# Patient Record
Sex: Female | Born: 1955
Health system: Southern US, Community
[De-identification: ages and names within clinical notes are randomized; demographics above are authoritative.]

## PROBLEM LIST (undated history)

## (undated) DIAGNOSIS — C801 Malignant (primary) neoplasm, unspecified: Secondary | ICD-10-CM

## (undated) DIAGNOSIS — J302 Other seasonal allergic rhinitis: Secondary | ICD-10-CM

## (undated) DIAGNOSIS — K5900 Constipation, unspecified: Secondary | ICD-10-CM

## (undated) DIAGNOSIS — M48061 Spinal stenosis, lumbar region without neurogenic claudication: Secondary | ICD-10-CM

## (undated) DIAGNOSIS — R079 Chest pain, unspecified: Secondary | ICD-10-CM

## (undated) DIAGNOSIS — E1140 Type 2 diabetes mellitus with diabetic neuropathy, unspecified: Secondary | ICD-10-CM

## (undated) DIAGNOSIS — M2550 Pain in unspecified joint: Secondary | ICD-10-CM

## (undated) DIAGNOSIS — T7840XA Allergy, unspecified, initial encounter: Secondary | ICD-10-CM

## (undated) DIAGNOSIS — R112 Nausea with vomiting, unspecified: Secondary | ICD-10-CM

## (undated) DIAGNOSIS — M170 Bilateral primary osteoarthritis of knee: Secondary | ICD-10-CM

## (undated) DIAGNOSIS — D649 Anemia, unspecified: Secondary | ICD-10-CM

## (undated) DIAGNOSIS — R1310 Dysphagia, unspecified: Secondary | ICD-10-CM

## (undated) DIAGNOSIS — Z9889 Other specified postprocedural states: Secondary | ICD-10-CM

## (undated) DIAGNOSIS — G5600 Carpal tunnel syndrome, unspecified upper limb: Secondary | ICD-10-CM

## (undated) DIAGNOSIS — F419 Anxiety disorder, unspecified: Secondary | ICD-10-CM

## (undated) DIAGNOSIS — H409 Unspecified glaucoma: Secondary | ICD-10-CM

## (undated) DIAGNOSIS — E669 Obesity, unspecified: Secondary | ICD-10-CM

## (undated) DIAGNOSIS — I10 Essential (primary) hypertension: Secondary | ICD-10-CM

## (undated) DIAGNOSIS — I499 Cardiac arrhythmia, unspecified: Secondary | ICD-10-CM

## (undated) DIAGNOSIS — E7800 Pure hypercholesterolemia, unspecified: Secondary | ICD-10-CM

## (undated) DIAGNOSIS — E039 Hypothyroidism, unspecified: Secondary | ICD-10-CM

## (undated) DIAGNOSIS — R002 Palpitations: Secondary | ICD-10-CM

## (undated) DIAGNOSIS — Z85850 Personal history of malignant neoplasm of thyroid: Secondary | ICD-10-CM

## (undated) DIAGNOSIS — K760 Fatty (change of) liver, not elsewhere classified: Secondary | ICD-10-CM

## (undated) DIAGNOSIS — I509 Heart failure, unspecified: Secondary | ICD-10-CM

## (undated) DIAGNOSIS — N189 Chronic kidney disease, unspecified: Secondary | ICD-10-CM

## (undated) DIAGNOSIS — R011 Cardiac murmur, unspecified: Secondary | ICD-10-CM

## (undated) DIAGNOSIS — K219 Gastro-esophageal reflux disease without esophagitis: Secondary | ICD-10-CM

## (undated) DIAGNOSIS — M549 Dorsalgia, unspecified: Secondary | ICD-10-CM

## (undated) DIAGNOSIS — M1990 Unspecified osteoarthritis, unspecified site: Secondary | ICD-10-CM

## (undated) DIAGNOSIS — M7989 Other specified soft tissue disorders: Secondary | ICD-10-CM

## (undated) DIAGNOSIS — M069 Rheumatoid arthritis, unspecified: Secondary | ICD-10-CM

## (undated) HISTORY — DX: Type 2 diabetes mellitus with diabetic neuropathy, unspecified: E11.40

## (undated) HISTORY — DX: Chest pain, unspecified: R07.9

## (undated) HISTORY — DX: Malignant (primary) neoplasm, unspecified: C80.1

## (undated) HISTORY — DX: Essential (primary) hypertension: I10

## (undated) HISTORY — DX: Cardiac murmur, unspecified: R01.1

## (undated) HISTORY — DX: Spinal stenosis, lumbar region without neurogenic claudication: M48.061

## (undated) HISTORY — DX: Pure hypercholesterolemia, unspecified: E78.00

## (undated) HISTORY — DX: Bilateral primary osteoarthritis of knee: M17.0

## (undated) HISTORY — PX: ROTATOR CUFF REPAIR: SHX139

## (undated) HISTORY — DX: Pain in unspecified joint: M25.50

## (undated) HISTORY — DX: Dysphagia, unspecified: R13.10

## (undated) HISTORY — DX: Other seasonal allergic rhinitis: J30.2

## (undated) HISTORY — DX: Chronic kidney disease, unspecified: N18.9

## (undated) HISTORY — DX: Anemia, unspecified: D64.9

## (undated) HISTORY — PX: CHOLECYSTECTOMY: SHX55

## (undated) HISTORY — PX: TUBAL LIGATION: SHX77

## (undated) HISTORY — DX: Carpal tunnel syndrome, unspecified upper limb: G56.00

## (undated) HISTORY — DX: Fatty (change of) liver, not elsewhere classified: K76.0

## (undated) HISTORY — PX: ELBOW SURGERY: SHX618

## (undated) HISTORY — PX: SPINE SURGERY: SHX786

## (undated) HISTORY — DX: Other specified soft tissue disorders: M79.89

## (undated) HISTORY — DX: Unspecified glaucoma: H40.9

## (undated) HISTORY — DX: Allergy, unspecified, initial encounter: T78.40XA

## (undated) HISTORY — DX: Unspecified osteoarthritis, unspecified site: M19.90

## (undated) HISTORY — PX: CARPAL TUNNEL RELEASE: SHX101

## (undated) HISTORY — DX: Constipation, unspecified: K59.00

## (undated) HISTORY — DX: Cardiac arrhythmia, unspecified: I49.9

## (undated) HISTORY — PX: BACK SURGERY: SHX140

## (undated) HISTORY — DX: Heart failure, unspecified: I50.9

## (undated) HISTORY — DX: Rheumatoid arthritis, unspecified: M06.9

## (undated) HISTORY — DX: Personal history of malignant neoplasm of thyroid: Z85.850

## (undated) HISTORY — DX: Palpitations: R00.2

---

## 1985-07-30 HISTORY — PX: TONSILECTOMY, ADENOIDECTOMY, BILATERAL MYRINGOTOMY AND TUBES: SHX2538

## 1985-07-30 HISTORY — PX: TONSILLECTOMY: SUR1361

## 1989-03-30 HISTORY — PX: CHOLECYSTECTOMY: SHX55

## 1996-07-30 DIAGNOSIS — F32A Depression, unspecified: Secondary | ICD-10-CM

## 1996-07-30 HISTORY — DX: Depression, unspecified: F32.A

## 1997-07-30 HISTORY — PX: FOOT SURGERY: SHX648

## 1998-02-18 ENCOUNTER — Ambulatory Visit (HOSPITAL_COMMUNITY): Admission: RE | Admit: 1998-02-18 | Discharge: 1998-02-18 | Payer: Self-pay

## 1998-04-20 ENCOUNTER — Emergency Department (HOSPITAL_COMMUNITY): Admission: EM | Admit: 1998-04-20 | Discharge: 1998-04-20 | Payer: Self-pay | Admitting: Emergency Medicine

## 1998-04-20 ENCOUNTER — Encounter: Payer: Self-pay | Admitting: Emergency Medicine

## 2001-01-20 ENCOUNTER — Ambulatory Visit (HOSPITAL_COMMUNITY): Admission: RE | Admit: 2001-01-20 | Discharge: 2001-01-20 | Payer: Self-pay | Admitting: Family Medicine

## 2001-01-20 ENCOUNTER — Encounter: Payer: Self-pay | Admitting: Family Medicine

## 2001-02-25 ENCOUNTER — Ambulatory Visit (HOSPITAL_COMMUNITY): Admission: RE | Admit: 2001-02-25 | Discharge: 2001-02-25 | Payer: Self-pay | Admitting: Orthopedic Surgery

## 2001-02-25 ENCOUNTER — Encounter: Payer: Self-pay | Admitting: Orthopedic Surgery

## 2001-03-06 ENCOUNTER — Encounter: Admission: RE | Admit: 2001-03-06 | Discharge: 2001-05-26 | Payer: Self-pay | Admitting: Orthopedic Surgery

## 2001-07-31 ENCOUNTER — Ambulatory Visit (HOSPITAL_COMMUNITY): Admission: RE | Admit: 2001-07-31 | Discharge: 2001-07-31 | Payer: Self-pay | Admitting: Orthopedic Surgery

## 2001-08-11 ENCOUNTER — Encounter: Admission: RE | Admit: 2001-08-11 | Discharge: 2001-10-22 | Payer: Self-pay | Admitting: Orthopedic Surgery

## 2001-09-23 ENCOUNTER — Inpatient Hospital Stay (HOSPITAL_COMMUNITY): Admission: RE | Admit: 2001-09-23 | Discharge: 2001-09-27 | Payer: Self-pay | Admitting: Psychiatry

## 2002-03-23 ENCOUNTER — Ambulatory Visit (HOSPITAL_COMMUNITY): Admission: RE | Admit: 2002-03-23 | Discharge: 2002-03-23 | Payer: Self-pay | Admitting: Internal Medicine

## 2002-03-23 ENCOUNTER — Encounter: Payer: Self-pay | Admitting: Internal Medicine

## 2002-08-26 ENCOUNTER — Ambulatory Visit (HOSPITAL_COMMUNITY): Admission: RE | Admit: 2002-08-26 | Discharge: 2002-08-26 | Payer: Self-pay | Admitting: Family Medicine

## 2003-09-06 ENCOUNTER — Ambulatory Visit (HOSPITAL_COMMUNITY): Admission: RE | Admit: 2003-09-06 | Discharge: 2003-09-06 | Payer: Self-pay | Admitting: Family Medicine

## 2003-09-14 ENCOUNTER — Ambulatory Visit (HOSPITAL_COMMUNITY): Admission: RE | Admit: 2003-09-14 | Discharge: 2003-09-14 | Payer: Self-pay | Admitting: Orthopedic Surgery

## 2004-04-04 ENCOUNTER — Ambulatory Visit: Payer: Self-pay | Admitting: Internal Medicine

## 2004-05-01 ENCOUNTER — Ambulatory Visit: Payer: Self-pay | Admitting: Family Medicine

## 2004-05-04 ENCOUNTER — Ambulatory Visit: Payer: Self-pay | Admitting: *Deleted

## 2004-06-06 ENCOUNTER — Ambulatory Visit: Payer: Self-pay | Admitting: Family Medicine

## 2004-09-22 ENCOUNTER — Ambulatory Visit: Payer: Self-pay | Admitting: Family Medicine

## 2004-11-02 ENCOUNTER — Ambulatory Visit: Payer: Self-pay | Admitting: Family Medicine

## 2004-11-02 ENCOUNTER — Ambulatory Visit (HOSPITAL_COMMUNITY): Admission: RE | Admit: 2004-11-02 | Discharge: 2004-11-02 | Payer: Self-pay | Admitting: Internal Medicine

## 2004-11-06 ENCOUNTER — Ambulatory Visit: Payer: Self-pay | Admitting: Family Medicine

## 2005-02-12 ENCOUNTER — Ambulatory Visit: Payer: Self-pay | Admitting: Internal Medicine

## 2005-03-14 ENCOUNTER — Ambulatory Visit: Payer: Self-pay | Admitting: Internal Medicine

## 2005-03-21 ENCOUNTER — Encounter: Admission: RE | Admit: 2005-03-21 | Discharge: 2005-03-21 | Payer: Self-pay | Admitting: Internal Medicine

## 2005-05-08 ENCOUNTER — Ambulatory Visit: Payer: Self-pay | Admitting: Family Medicine

## 2005-09-28 ENCOUNTER — Ambulatory Visit: Payer: Self-pay | Admitting: Family Medicine

## 2005-10-26 ENCOUNTER — Ambulatory Visit: Payer: Self-pay | Admitting: Family Medicine

## 2006-01-10 ENCOUNTER — Ambulatory Visit: Payer: Self-pay | Admitting: Family Medicine

## 2006-04-16 ENCOUNTER — Ambulatory Visit: Payer: Self-pay | Admitting: Family Medicine

## 2006-09-10 ENCOUNTER — Ambulatory Visit: Payer: Self-pay | Admitting: Family Medicine

## 2007-04-23 ENCOUNTER — Ambulatory Visit: Payer: Self-pay | Admitting: Internal Medicine

## 2007-05-02 ENCOUNTER — Encounter: Admission: RE | Admit: 2007-05-02 | Discharge: 2007-05-23 | Payer: Self-pay | Admitting: Family Medicine

## 2007-09-16 ENCOUNTER — Emergency Department (HOSPITAL_COMMUNITY): Admission: EM | Admit: 2007-09-16 | Discharge: 2007-09-16 | Payer: Self-pay | Admitting: Emergency Medicine

## 2007-11-18 ENCOUNTER — Ambulatory Visit: Payer: Self-pay | Admitting: Internal Medicine

## 2008-05-14 ENCOUNTER — Ambulatory Visit: Payer: Self-pay | Admitting: Internal Medicine

## 2008-05-18 ENCOUNTER — Ambulatory Visit: Payer: Self-pay | Admitting: Internal Medicine

## 2008-05-20 ENCOUNTER — Ambulatory Visit: Payer: Self-pay | Admitting: Internal Medicine

## 2008-06-09 ENCOUNTER — Encounter (INDEPENDENT_AMBULATORY_CARE_PROVIDER_SITE_OTHER): Payer: Self-pay | Admitting: Family Medicine

## 2008-06-09 ENCOUNTER — Ambulatory Visit: Payer: Self-pay | Admitting: Internal Medicine

## 2008-06-09 LAB — CONVERTED CEMR LAB
BUN: 9 mg/dL (ref 6–23)
CO2: 24 meq/L (ref 19–32)
Calcium: 9.4 mg/dL (ref 8.4–10.5)
Chloride: 102 meq/L (ref 96–112)
Cholesterol: 173 mg/dL (ref 0–200)
Creatinine, Ser: 0.93 mg/dL (ref 0.40–1.20)
HDL: 39 mg/dL — ABNORMAL LOW (ref >39)
Total CHOL/HDL Ratio: 4.4
Triglycerides: 186 mg/dL — ABNORMAL HIGH (ref <150)

## 2008-10-08 ENCOUNTER — Ambulatory Visit: Payer: Self-pay | Admitting: Family Medicine

## 2009-01-13 ENCOUNTER — Encounter: Payer: Self-pay | Admitting: Internal Medicine

## 2009-01-13 ENCOUNTER — Ambulatory Visit: Payer: Self-pay | Admitting: Internal Medicine

## 2009-01-13 DIAGNOSIS — M1990 Unspecified osteoarthritis, unspecified site: Secondary | ICD-10-CM | POA: Insufficient documentation

## 2009-01-13 DIAGNOSIS — E119 Type 2 diabetes mellitus without complications: Secondary | ICD-10-CM | POA: Insufficient documentation

## 2009-01-13 DIAGNOSIS — I10 Essential (primary) hypertension: Secondary | ICD-10-CM

## 2009-01-13 DIAGNOSIS — E785 Hyperlipidemia, unspecified: Secondary | ICD-10-CM | POA: Insufficient documentation

## 2009-03-23 ENCOUNTER — Ambulatory Visit: Payer: Self-pay | Admitting: Internal Medicine

## 2009-03-23 ENCOUNTER — Encounter (INDEPENDENT_AMBULATORY_CARE_PROVIDER_SITE_OTHER): Payer: Self-pay | Admitting: Adult Health

## 2009-03-23 ENCOUNTER — Encounter: Payer: Self-pay | Admitting: Cardiovascular Disease

## 2009-03-23 LAB — CONVERTED CEMR LAB
AST: 13 units/L (ref 0–37)
Albumin: 4.2 g/dL (ref 3.5–5.2)
BUN: 12 mg/dL (ref 6–23)
Calcium: 9 mg/dL (ref 8.4–10.5)
Chloride: 101 meq/L (ref 96–112)
Glucose, Bld: 89 mg/dL (ref 70–99)
HDL: 41 mg/dL (ref >39)
Potassium: 3.5 meq/L (ref 3.5–5.3)
Triglycerides: 117 mg/dL (ref <150)

## 2009-03-28 ENCOUNTER — Ambulatory Visit (HOSPITAL_COMMUNITY): Admission: RE | Admit: 2009-03-28 | Discharge: 2009-03-28 | Payer: Self-pay | Admitting: Internal Medicine

## 2009-04-01 ENCOUNTER — Encounter: Admission: RE | Admit: 2009-04-01 | Discharge: 2009-04-01 | Payer: Self-pay | Admitting: Internal Medicine

## 2009-04-07 ENCOUNTER — Ambulatory Visit: Payer: Self-pay | Admitting: Internal Medicine

## 2009-04-12 DIAGNOSIS — J40 Bronchitis, not specified as acute or chronic: Secondary | ICD-10-CM | POA: Insufficient documentation

## 2009-04-12 DIAGNOSIS — K219 Gastro-esophageal reflux disease without esophagitis: Secondary | ICD-10-CM | POA: Insufficient documentation

## 2009-04-13 ENCOUNTER — Ambulatory Visit: Payer: Self-pay | Admitting: Cardiovascular Disease

## 2009-04-13 DIAGNOSIS — R0602 Shortness of breath: Secondary | ICD-10-CM

## 2009-04-13 DIAGNOSIS — R079 Chest pain, unspecified: Secondary | ICD-10-CM | POA: Insufficient documentation

## 2009-04-20 ENCOUNTER — Ambulatory Visit: Payer: Self-pay

## 2009-04-21 ENCOUNTER — Encounter: Payer: Self-pay | Admitting: Cardiology

## 2009-04-21 ENCOUNTER — Ambulatory Visit: Payer: Self-pay

## 2009-05-04 ENCOUNTER — Ambulatory Visit: Payer: Self-pay | Admitting: Internal Medicine

## 2009-05-04 ENCOUNTER — Telehealth (INDEPENDENT_AMBULATORY_CARE_PROVIDER_SITE_OTHER): Payer: Self-pay | Admitting: *Deleted

## 2009-05-04 ENCOUNTER — Encounter (INDEPENDENT_AMBULATORY_CARE_PROVIDER_SITE_OTHER): Payer: Self-pay | Admitting: Adult Health

## 2009-06-20 ENCOUNTER — Encounter (INDEPENDENT_AMBULATORY_CARE_PROVIDER_SITE_OTHER): Payer: Self-pay | Admitting: Adult Health

## 2009-06-20 ENCOUNTER — Ambulatory Visit: Payer: Self-pay | Admitting: Internal Medicine

## 2009-06-20 ENCOUNTER — Other Ambulatory Visit: Admission: RE | Admit: 2009-06-20 | Discharge: 2009-06-20 | Payer: Self-pay | Admitting: Internal Medicine

## 2009-06-20 LAB — CONVERTED CEMR LAB
Chlamydia, DNA Probe: NEGATIVE
Microalb, Ur: 4.4 mg/dL — ABNORMAL HIGH (ref 0.00–1.89)

## 2009-07-18 ENCOUNTER — Ambulatory Visit: Payer: Self-pay | Admitting: Internal Medicine

## 2009-07-30 DIAGNOSIS — Z85850 Personal history of malignant neoplasm of thyroid: Secondary | ICD-10-CM

## 2009-07-30 HISTORY — DX: Personal history of malignant neoplasm of thyroid: Z85.850

## 2009-07-30 HISTORY — PX: TOTAL THYROIDECTOMY: SHX2547

## 2009-10-17 ENCOUNTER — Ambulatory Visit: Payer: Self-pay | Admitting: Internal Medicine

## 2009-10-27 ENCOUNTER — Emergency Department (HOSPITAL_COMMUNITY): Admission: EM | Admit: 2009-10-27 | Discharge: 2009-10-27 | Payer: Self-pay | Admitting: Emergency Medicine

## 2009-11-04 ENCOUNTER — Encounter: Admission: RE | Admit: 2009-11-04 | Discharge: 2009-11-04 | Payer: Self-pay | Admitting: Internal Medicine

## 2009-11-14 ENCOUNTER — Ambulatory Visit: Payer: Self-pay | Admitting: Family Medicine

## 2009-11-14 ENCOUNTER — Encounter (INDEPENDENT_AMBULATORY_CARE_PROVIDER_SITE_OTHER): Payer: Self-pay | Admitting: Adult Health

## 2009-11-14 LAB — CONVERTED CEMR LAB
ALT: 9 units/L (ref 0–35)
AST: 11 units/L (ref 0–37)
Albumin: 4.4 g/dL (ref 3.5–5.2)
Alkaline Phosphatase: 57 units/L (ref 39–117)
BUN: 17 mg/dL (ref 6–23)
Calcium: 9.4 mg/dL (ref 8.4–10.5)
Chloride: 102 meq/L (ref 96–112)
Eosinophils Relative: 5 % (ref 0–5)
Free T4: 0.78 ng/dL — ABNORMAL LOW (ref 0.80–1.80)
HCT: 34.6 % — ABNORMAL LOW (ref 36.0–46.0)
LDL Cholesterol: 88 mg/dL (ref 0–99)
Lymphocytes Relative: 39 % (ref 12–46)
Lymphs Abs: 1.9 10*3/uL (ref 0.7–4.0)
Neutro Abs: 2.3 10*3/uL (ref 1.7–7.7)
Platelets: 345 10*3/uL (ref 150–400)
Potassium: 4 meq/L (ref 3.5–5.3)
Sodium: 139 meq/L (ref 135–145)
TSH: 2.648 microintl units/mL (ref 0.350–4.500)
Total Protein: 7.2 g/dL (ref 6.0–8.3)
Vit D, 25-Hydroxy: 17 ng/mL — ABNORMAL LOW (ref 30–89)
WBC: 4.8 10*3/uL (ref 4.0–10.5)

## 2009-11-15 ENCOUNTER — Encounter (INDEPENDENT_AMBULATORY_CARE_PROVIDER_SITE_OTHER): Payer: Self-pay | Admitting: Adult Health

## 2009-11-16 ENCOUNTER — Ambulatory Visit (HOSPITAL_COMMUNITY): Admission: RE | Admit: 2009-11-16 | Discharge: 2009-11-16 | Payer: Self-pay | Admitting: Internal Medicine

## 2010-02-08 ENCOUNTER — Ambulatory Visit: Payer: Self-pay | Admitting: Internal Medicine

## 2010-02-14 ENCOUNTER — Ambulatory Visit (HOSPITAL_COMMUNITY): Admission: RE | Admit: 2010-02-14 | Discharge: 2010-02-14 | Payer: Self-pay | Admitting: Internal Medicine

## 2010-02-21 ENCOUNTER — Ambulatory Visit: Payer: Self-pay | Admitting: Internal Medicine

## 2010-03-01 ENCOUNTER — Other Ambulatory Visit: Admission: RE | Admit: 2010-03-01 | Discharge: 2010-03-01 | Payer: Self-pay | Admitting: Interventional Radiology

## 2010-03-01 ENCOUNTER — Encounter: Admission: RE | Admit: 2010-03-01 | Discharge: 2010-03-01 | Payer: Self-pay | Admitting: Internal Medicine

## 2010-03-10 ENCOUNTER — Ambulatory Visit: Payer: Self-pay | Admitting: Internal Medicine

## 2010-03-29 ENCOUNTER — Ambulatory Visit (HOSPITAL_COMMUNITY): Admission: RE | Admit: 2010-03-29 | Discharge: 2010-03-29 | Payer: Self-pay | Admitting: Internal Medicine

## 2010-04-07 ENCOUNTER — Emergency Department (HOSPITAL_COMMUNITY): Admission: EM | Admit: 2010-04-07 | Discharge: 2010-04-07 | Payer: Self-pay | Admitting: Emergency Medicine

## 2010-05-12 ENCOUNTER — Encounter (INDEPENDENT_AMBULATORY_CARE_PROVIDER_SITE_OTHER): Payer: Self-pay | Admitting: Surgery

## 2010-05-12 ENCOUNTER — Ambulatory Visit (HOSPITAL_COMMUNITY): Admission: RE | Admit: 2010-05-12 | Discharge: 2010-05-13 | Payer: Self-pay | Admitting: Surgery

## 2010-06-19 ENCOUNTER — Encounter (HOSPITAL_COMMUNITY)
Admission: RE | Admit: 2010-06-19 | Discharge: 2010-08-29 | Payer: Self-pay | Source: Home / Self Care | Attending: Internal Medicine | Admitting: Internal Medicine

## 2010-07-25 ENCOUNTER — Encounter (INDEPENDENT_AMBULATORY_CARE_PROVIDER_SITE_OTHER): Payer: Self-pay | Admitting: *Deleted

## 2010-07-25 LAB — CONVERTED CEMR LAB
BUN: 13 mg/dL (ref 6–23)
Basophils Absolute: 0 10*3/uL (ref 0.0–0.1)
Basophils Relative: 0 % (ref 0–1)
CO2: 23 meq/L (ref 19–32)
Chloride: 103 meq/L (ref 96–112)
Creatinine, Ser: 0.92 mg/dL (ref 0.40–1.20)
Hemoglobin: 10.3 g/dL — ABNORMAL LOW (ref 12.0–15.0)
Lymphocytes Relative: 21 % (ref 12–46)
Monocytes Absolute: 0.5 10*3/uL (ref 0.1–1.0)
Monocytes Relative: 8 % (ref 3–12)
Neutro Abs: 3.1 10*3/uL (ref 1.7–7.7)
Neutrophils Relative %: 52 % (ref 43–77)
Potassium: 3.7 meq/L (ref 3.5–5.3)
RBC: 3.96 M/uL (ref 3.87–5.11)
Vit D, 25-Hydroxy: 23 ng/mL — ABNORMAL LOW (ref 30–89)
WBC: 6 10*3/uL (ref 4.0–10.5)

## 2010-08-19 ENCOUNTER — Encounter: Payer: Self-pay | Admitting: Internal Medicine

## 2010-10-11 LAB — CALCIUM: Calcium: 8.9 mg/dL (ref 8.4–10.5)

## 2010-10-12 LAB — CBC
Hemoglobin: 10.1 g/dL — ABNORMAL LOW (ref 12.0–15.0)
Hemoglobin: 10.9 g/dL — ABNORMAL LOW (ref 12.0–15.0)
MCH: 25.9 pg — ABNORMAL LOW (ref 26.0–34.0)
MCV: 79.8 fL (ref 78.0–100.0)
RBC: 3.73 MIL/uL — ABNORMAL LOW (ref 3.87–5.11)
RBC: 4.21 MIL/uL (ref 3.87–5.11)
WBC: 5.8 10*3/uL (ref 4.0–10.5)

## 2010-10-12 LAB — URINE MICROSCOPIC-ADD ON

## 2010-10-12 LAB — DIFFERENTIAL
Basophils Relative: 1 % (ref 0–1)
Eosinophils Absolute: 0.1 10*3/uL (ref 0.0–0.7)
Eosinophils Relative: 1 % (ref 0–5)
Lymphocytes Relative: 25 % (ref 12–46)
Lymphs Abs: 1.5 10*3/uL (ref 0.7–4.0)
Lymphs Abs: 0.7 10*3/uL (ref 0.7–4.0)
Monocytes Relative: 6 % (ref 3–12)
Monocytes Relative: 7 % (ref 3–12)
Neutro Abs: 3.5 10*3/uL (ref 1.7–7.7)
Neutrophils Relative %: 60 % (ref 43–77)

## 2010-10-12 LAB — BASIC METABOLIC PANEL
CO2: 26 mEq/L (ref 19–32)
Chloride: 100 mEq/L (ref 96–112)
GFR calc Af Amer: 48 mL/min — ABNORMAL LOW (ref >60)
Sodium: 138 mEq/L (ref 135–145)

## 2010-10-12 LAB — COMPREHENSIVE METABOLIC PANEL
ALT: 12 U/L (ref 0–35)
Alkaline Phosphatase: 58 U/L (ref 39–117)
CO2: 27 mEq/L (ref 19–32)
Calcium: 8.9 mg/dL (ref 8.4–10.5)
GFR calc non Af Amer: 56 mL/min — ABNORMAL LOW (ref >60)
Glucose, Bld: 161 mg/dL — ABNORMAL HIGH (ref 70–99)
Potassium: 3.3 mEq/L — ABNORMAL LOW (ref 3.5–5.1)
Sodium: 139 mEq/L (ref 135–145)

## 2010-10-12 LAB — URINALYSIS, ROUTINE W REFLEX MICROSCOPIC
Bilirubin Urine: NEGATIVE
Glucose, UA: NEGATIVE mg/dL
Ketones, ur: NEGATIVE mg/dL
Nitrite: NEGATIVE
Protein, ur: 100 mg/dL — AB
Specific Gravity, Urine: 1.018 (ref 1.005–1.030)
Urobilinogen, UA: 0.2 mg/dL (ref 0.0–1.0)

## 2010-10-12 LAB — URINE CULTURE: Colony Count: >=100000

## 2010-10-12 LAB — PREGNANCY, URINE: Preg Test, Ur: NEGATIVE

## 2010-10-12 LAB — SURGICAL PCR SCREEN: Staphylococcus aureus: POSITIVE — AB

## 2010-10-12 LAB — PROTIME-INR: INR: 1.13 (ref 0.00–1.49)

## 2010-10-18 ENCOUNTER — Encounter: Payer: Self-pay | Admitting: Internal Medicine

## 2010-10-18 ENCOUNTER — Encounter (INDEPENDENT_AMBULATORY_CARE_PROVIDER_SITE_OTHER): Payer: Self-pay | Admitting: *Deleted

## 2010-10-18 LAB — CONVERTED CEMR LAB
AST: 14 units/L (ref 0–37)
Alkaline Phosphatase: 61 units/L (ref 39–117)
BUN: 16 mg/dL (ref 6–23)
Basophils Relative: 0 % (ref 0–1)
Creatinine, Ser: 0.99 mg/dL (ref 0.40–1.20)
Eosinophils Absolute: 0.5 10*3/uL (ref 0.0–0.7)
Eosinophils Relative: 9 % — ABNORMAL HIGH (ref 0–5)
Free T4: 1.71 ng/dL (ref 0.80–1.80)
Glucose, Bld: 130 mg/dL — ABNORMAL HIGH (ref 70–99)
HCT: 32.5 % — ABNORMAL LOW (ref 36.0–46.0)
MCHC: 31.7 g/dL (ref 30.0–36.0)
MCV: 82.1 fL (ref 78.0–100.0)
Monocytes Relative: 6 % (ref 3–12)
Neutrophils Relative %: 61 % (ref 43–77)
Potassium: 4 meq/L (ref 3.5–5.3)
RBC: 3.96 M/uL (ref 3.87–5.11)
T3, Total: 88.5 ng/dL (ref 80.0–204.0)
TSH: <0.008 microintl units/mL — ABNORMAL LOW (ref 0.350–4.500)
Total Bilirubin: 0.3 mg/dL (ref 0.3–1.2)

## 2010-10-19 ENCOUNTER — Other Ambulatory Visit (HOSPITAL_COMMUNITY): Payer: Self-pay | Admitting: Family Medicine

## 2010-10-19 ENCOUNTER — Encounter (INDEPENDENT_AMBULATORY_CARE_PROVIDER_SITE_OTHER): Payer: Self-pay | Admitting: *Deleted

## 2010-10-19 DIAGNOSIS — K311 Adult hypertrophic pyloric stenosis: Secondary | ICD-10-CM

## 2010-10-19 DIAGNOSIS — R1011 Right upper quadrant pain: Secondary | ICD-10-CM

## 2010-10-19 DIAGNOSIS — N200 Calculus of kidney: Secondary | ICD-10-CM

## 2010-10-20 LAB — COMPREHENSIVE METABOLIC PANEL
Alkaline Phosphatase: 61 U/L (ref 39–117)
BUN: 9 mg/dL (ref 6–23)
CO2: 26 mEq/L (ref 19–32)
Chloride: 100 mEq/L (ref 96–112)
Creatinine, Ser: 1.03 mg/dL (ref 0.4–1.2)
GFR calc non Af Amer: 56 mL/min — ABNORMAL LOW (ref >60)
Glucose, Bld: 198 mg/dL — ABNORMAL HIGH (ref 70–99)
Total Bilirubin: 0.8 mg/dL (ref 0.3–1.2)

## 2010-10-20 LAB — URINALYSIS, ROUTINE W REFLEX MICROSCOPIC
Nitrite: POSITIVE — AB
Specific Gravity, Urine: 1.017 (ref 1.005–1.030)
Urobilinogen, UA: 1 mg/dL (ref 0.0–1.0)
pH: 6 (ref 5.0–8.0)

## 2010-10-20 LAB — CBC
HCT: 34.2 % — ABNORMAL LOW (ref 36.0–46.0)
Hemoglobin: 10.9 g/dL — ABNORMAL LOW (ref 12.0–15.0)
MCV: 80.8 fL (ref 78.0–100.0)
Platelets: 296 10*3/uL (ref 150–400)
RBC: 4.23 MIL/uL (ref 3.87–5.11)
WBC: 11.1 10*3/uL — ABNORMAL HIGH (ref 4.0–10.5)

## 2010-10-20 LAB — URINE MICROSCOPIC-ADD ON

## 2010-10-20 LAB — DIFFERENTIAL
Basophils Absolute: 0 10*3/uL (ref 0.0–0.1)
Basophils Relative: 0 % (ref 0–1)
Lymphocytes Relative: 6 % — ABNORMAL LOW (ref 12–46)
Neutro Abs: 9.5 10*3/uL — ABNORMAL HIGH (ref 1.7–7.7)
Neutrophils Relative %: 86 % — ABNORMAL HIGH (ref 43–77)

## 2010-10-20 LAB — URINE CULTURE: Colony Count: >=100000

## 2010-10-20 LAB — POCT PREGNANCY, URINE: Preg Test, Ur: NEGATIVE

## 2010-10-20 LAB — GLUCOSE, CAPILLARY

## 2010-10-23 ENCOUNTER — Ambulatory Visit (HOSPITAL_COMMUNITY)
Admission: RE | Admit: 2010-10-23 | Discharge: 2010-10-23 | Disposition: A | Discharge status: Home / Self Care | Payer: Medicare Other | Source: Ambulatory Visit | Attending: Family Medicine | Admitting: Family Medicine

## 2010-10-23 DIAGNOSIS — K311 Adult hypertrophic pyloric stenosis: Secondary | ICD-10-CM

## 2010-10-23 DIAGNOSIS — N200 Calculus of kidney: Secondary | ICD-10-CM

## 2010-10-23 DIAGNOSIS — D3000 Benign neoplasm of unspecified kidney: Secondary | ICD-10-CM | POA: Insufficient documentation

## 2010-10-23 DIAGNOSIS — R1011 Right upper quadrant pain: Secondary | ICD-10-CM | POA: Insufficient documentation

## 2010-10-23 MED ORDER — IOHEXOL 300 MG/ML  SOLN
100.0000 mL | Freq: Once | INTRAMUSCULAR | Status: AC | PRN
  Administered 2010-10-23: 100 mL via INTRAVENOUS

## 2010-12-12 ENCOUNTER — Encounter (INDEPENDENT_AMBULATORY_CARE_PROVIDER_SITE_OTHER): Payer: Self-pay | Admitting: Surgery

## 2010-12-15 NOTE — Op Note (Signed)
[place]  Patient:    Autumn Carson, Autumn Carson                        MRN: [id number] Proc. Date: 02/25/01 Adm. Date:  [id number] Attending:  Drema Pry CC:         [facility]   Operative Report  PREOPERATIVE DIAGNOSIS: 1. Left shoulder impingement syndrome. 2. Acromioclavicular joint arthritis. 3. Subdeltoid bursitis, rule out rotator cuff tear.  POSTOPERATIVE DIAGNOSIS: 1. Left shoulder impingement syndrome. 2. Acromioclavicular joint arthritis. 3. Subdeltoid bursitis, rule out rotator cuff tear with no rotator cuff tear.  OPERATION PERFORMED:  Left shoulder operative arthroscopy in this morbidly obese patient with subacromial arch decompression, acromioplasty, distal clavicle resection and subdeltoid bursectomy.  SURGEON:  Jearld Adjutant, M.D.  ASSISTANT:  Darien Ramus, P.A.-C.  ANESTHESIA:  General endotracheal.  CULTURES:  None.  DRAINS:  None.  ESTIMATED BLOOD LOSS:  Minimal.  PATHOLOGIC FINDINGS AND HISTORY:  The patient is a 55 year old female referred by [facility].  She is grossly overweight.  She was recently felt not to be a candidate for surgery due to her size at the outpatient center, orthopedic center, ____________ .  She is almost 400  pounds.  It was therefore elected to proceed with surgery here.  She has had failed conservative management with injections for bilateral acromial impingement, AC joint arthritis, type 3 acromions.  She recently had a right carpal tunnel release uneventfully.  At surgery today, she had a very tight subacromial space, thickened subdeltoid bursa but no through-and-through rotator cuff tear.  There was no SLAP detachment.  The biceps was intact.  The glenohumeral surfaces were basically intact.  She had a sharp craggy anterior acromion with the inferior hook and obviously arthritic distal clavicle.  Acromioplasty as well as distal clavicle resection was carried out as well as  subdeltoid bursectomy.  DESCRIPTION OF PROCEDURE:  With adequate anesthesia obtained using endotracheal technique, 1 gm Ancef given IV prophylaxis, the patient was placed in supine beach chair position.  The left shoulder was then prepped and draped in the standard fashion.  Skin markings were made for anatomic positioning and 20 cc of 0.5% Marcaine was injected with epinephrine in the subacromial space ____________ through the posterior portal ____________ coracoid.  I then entered the subacromial space from the posterior portal.  An anterolateral portal was established just lateral to the coracoid. Subacromial dissection and shaver was carried out with ablator.  I then brought in the 6.0 bur and completed acromioplasty of the roof of the subacromial space in the manner of Caspari.  I then turned the scope sideways and with basket shaver and bur did a distal clavicle resection two shaverbreadths in.  I then looked from the side portal, smoothed the acromion from the side view and removed bursa from this rotator cuff with internal rotation and external rotation and abduction.  I then thoroughly irrigated the shoulder, bleeding points were cauterized.  0.5% Marcaine was injected in and about the portals.  The portals were left open.  Bulky sterile compressive dressing was applied with a sling. The patient having tolerated the procedure well was awakened and taken to the recovery room in satisfactory condition to be discharged per outpatient routine, told to call the office for appointment for recheck tomorrow, given Tylox for pain.  Laboratory data was within normal limits. DD:  02/25/01 TD:  02/25/01 Job: [id number]

## 2010-12-15 NOTE — Discharge Summary (Signed)
[facility]  Patient:    Autumn Carson, Autumn Carson Visit Number: [id number] MRN: [id number]          Service Type: PSY Location: 500 0507 02 Attending Physician:  Rachael Fee Dictated by:   Reymundo Poll Dub Mikes, M.D. Admit Date:  09/23/2001 Discharge Date: 09/27/2001                             Discharge Summary  CHIEF COMPLAINT AND PRESENT ILLNESS:  This was the second admission to [facility] for this 55 year old female voluntarily admitted due to suicidal ideation.  Presented with a history of depression, reacting to several stressors, loss of her job, grieving over recent family members death, some of them unexpected.  Started having suicidal ruminations, wanting to die by overdosing.  PAST PSYCHIATRIC HISTORY:  [facility] in [facility] with suicidal ideation. No outpatient treatment after that.  Had been in [place] in 1991 after an overdose.  ALCOHOL/DRUG HISTORY:  Denies the use or abuse of any substances.  MEDICAL HISTORY:  Positive for hypertension, non-insulin-dependent diabetes mellitus, hypercholesterolemia.  MEDICATIONS:  Zocor 20 mg daily, Vioxx 25 mg every day, hydrochlorothiazide 25 mg every day, Glucophage XR 500 mg twice a day, Prinivil 10 mg daily, Wellbutrin SR 150 mg in the morning, Hygroton 25 mg every day.  PHYSICAL EXAMINATION:  Performed and failed to show any acute findings.  MENTAL STATUS EXAMINATION:  Upon admission revealed an alert, middle-aged, overweight female appropriately dressed.  Mood was of depression.  Affect was of depression.  Minimal eye contact.  She is cooperative.  Speech was normal, clear, goal directed.  Thought process logical, coherent and relevant.  Denied any active suicidal ideation upon the evaluation.  Denies any homicidal ideation.  Was wanting to deal with the stressors and feel better.  Cognition oriented to person, place and time.  Recent and remote  memories well-preserved.  ADMISSION DIAGNOSES: Axis I:    Major depression, recurrent. Axis II:   No diagnosis. Axis III:  1. Hypertension.            2. Cholesterolemia.            3. Non-insulin-dependent diabetes mellitus. Axis IV:   Moderate. Axis V:    Global Assessment of Functioning upon admission 25; highest Global            Assessment of Functioning in the last year 65.  LABORATORY DATA:  CBC was within normal limits.  Hemoglobin was 11.3.  Serum chemistries with potassium 2.9, was replaced and came up to 3.1.  Liver profile was within normal limits.  Thyroid profile was within normal limits. Drug screen was negative for substances of abuse.  HOSPITAL COURSE:  She was admitted and started intensive individual and group psychotherapy.  Wellbutrin was increased and she was placed on Prozac.  By September 26, 2001, she was feeling better, less depressed.  There was some family support that was very, very helpful.  By September 27, 2001, she was in full contact with reality.  Denied any active suicidal or homicidal ideation.  Had dealt with some of the stressors, the losses.  Willing and motivated to pursue outpatient treatment.  She was considered for discharge and discharge was granted.  Family was supportive and she was willing to pursue outpatient follow-up.  DISCHARGE DIAGNOSES: Axis I:    Major depression, recurrent. Axis II:   No diagnosis. Axis III:  1. Hypertension.  2. Diabetes mellitus.            3. Hypercholesterolemia. Axis IV:   Moderate. Axis V:    Global Assessment of Functioning upon discharge 60.  DISCHARGE MEDICATIONS: 1. Zocor 20 mg daily. 2. Vistaril 25 mg at bedtime. 3. Vioxx 25 mg daily. 4. Glucophage XR 500 mg twice a day. 5. Hygroton 25 mg every day. 6. Prinivil 10 mg daily. 7. Wellbutrin SR 100 mg in the morning plus 20 mg daily. 8. Ambien 10 mg at bedtime for sleep.  FOLLOW-UP:  Outpatient psychiatrist. Dictated by:   Reymundo Poll.  Dub Mikes, M.D. Attending Physician:  Rachael Fee DD:  10/29/01 TD:  10/30/01 Job: [id number]

## 2010-12-15 NOTE — Op Note (Signed)
NAMEMELORA, Autumn Carson NO.:  [id number]   MEDICAL RECORD NO.:  [id number]                   PATIENT TYPE:  OIB   LOCATION:  2899                                 FACILITY:  [facility]   PHYSICIAN:  Deidre Ala, M.D.                 DATE OF BIRTH:  [birth year]   DATE OF PROCEDURE:  09/14/2003  DATE OF DISCHARGE:                                 OPERATIVE REPORT   PREOPERATIVE DIAGNOSES:  1. Left elbow cubital tunnel syndrome, with ulnar nerve compression.  2. Chronic, medial elbow epicondylitis.   POSTOPERATIVE DIAGNOSES:  1. Left elbow cubital tunnel syndrome, with ulnar nerve compression.  2. Chronic, medial elbow epicondylitis.   PROCEDURE:  1. Left elbow cubital tunnel release, ulnar nerve neurolysis at elbow.  2. Partial medial epicondylectomy with Jobe procedure, with scar excision     and reattachment.   SURGEON:  Bradley Ferris, M.D.   ASSISTANT:  Madilyn Fireman, P.A.-C.   ANESTHESIA:  General endotracheal.   CULTURES:  None.   DRAINS:  None.   ESTIMATED BLOOD LOSS:  Minimal.   TOURNIQUET TIME:  One hour, 17 minutes.   PATHOLOGIC FINDINGS AND HISTORY:  Autumn Carson is morbidly obese.  She has had  chronic problems with ulnar nerve symptoms and tenderness, medial  epicondyle.  We did the procedure on the right side.  She has had excellent  results from that and the left was actually worse than the right, but she  underwent the right procedure first on August 27, 2002.  Nerve conduction  testing was positive for cubital tunnel syndrome and she desired to proceed  with surgical intervention as we had done on the opposite side.  She has  also had carpal tunnel syndrome and bilateral shoulder surgery.  She was not  done in the outpatient because of her size and other than that though with  co-morbidities of high blood pressure and diabetes.   At surgery, she had a very sharp, prominent medial epicondyle and a classic  ulnar nerve constriction just  at the cubital tunnel.  We released it  proximally and distally including the medial intramuscular septum and then  did a partial medial epicondylectomy as well as scar excision a la Jobe and  reattached the tendon attachment after scar removal, decortication of the  medial epicondyle and partial removal, and this also decompressed the  cubital tunnel, disallowing the nerve from pinching on it with flexion and  extension.  She had a positive mild cubital syndrome test with positive  nerve conduction study July 14, 2002.   PROCEDURE:  With adequate anesthesia obtained using endotracheal technique,  1 g Ancef given IV prophylaxis, the patient was placed in the supine  position.  The left upper extremity was prepped and draped in the standard  fashion.  After standard prepping and draping, Esmarch exsanguination was  used.  The tourniquet was let up to 350 mmHg.   The incision  was deepened sharply and then adequate hemostasis was obtained  using the Bovie electrocoagulator.  This was quite difficult due to the  thickness of the adipose tissue which was index-finger length at the elbow.  We had to use a headlamp and loupes.  Dissection was carried down to the  ulnar groove.  We then carefully neurolysed the nerve proximally and  distally, releasing all bands that were tight around it, the fascia up  proximally in the arm and distally into the musculature.  This relieved the  nerve well.  We also did an epineurectomy on the medial surface.   I then isolated the medial common flexor mass, dissected it off the medial  epicondyle.  It was obviously diseased and filled with scar and granulation.  We removed it.  We then used rongeur and osteotome to do the partial medial  epicondylectomy and decortication.  I then removed scar from the  undersurface of the medial common flexor mass.  I then reattached it down  with suture passed through the bone, 0 Vicryl, and reattached it with the  elbow  and wrist flexed, and then sutured it around with 0 Vicryl to re-  anchor it.   Irrigation was carried out, and the wound was closed in layers with 0, 2-0,  3-0 Vicryl and skin staples.  A bulky, sterile compressive dressing was  applied with Ace and sling, and the patient having tolerated the procedure  well, was awakened and taken to the recovery room in satisfactory condition,  and will be discharged by outpatient routine.   Told to call the office for a recheck on Saturday, given Percocet for pain.  Laboratory data within normal limits.                                               Deidre Ala, M.D.    VEP/MEDQ  D:  09/14/2003  T:  09/14/2003  Job:  [id number]   cc:   Deidre Ala, M.D.  [address]  [place], [place] [address]  Fax: [phone]   Willis Modena, Dr.  [facility]

## 2010-12-15 NOTE — Op Note (Signed)
[place]  Patient:    Autumn Carson, Autumn Carson Visit Number: [id number] MRN: [id number]          Service Type: DSU Location: RCRM 2550 07 Attending Physician:  Drema Pry Dictated by:   Jearld Adjutant, M.D. Proc. Date: 07/31/01 Admit Date:  07/31/2001                             Operative Report  PREOPERATIVE DIAGNOSES: 1. Right shoulder impingement syndrome with supraspinatus tendinitis. 2. Right shoulder acromioclavicular joint arthritis.  POSTOPERATIVE DIAGNOSES: 1. Right shoulder impingement syndrome with supraspinatus tendinitis. 2. Right shoulder acromioclavicular joint arthritis.  PROCEDURES: 1. Right shoulder subacromial arch decompression, acromioplasty    arthroscopically. 2. Right shoulder distal clavicle resection arthroscopically. 3. Right shoulder subdeltoid bursectomy.  SURGEON:  Jearld Adjutant, M.D.  ASSISTANT:  Luretha Rued, P.A.-C.  ANESTHESIA:  General endotracheal.  CULTURES:  None.  DRAINS:  None.  ESTIMATED BLOOD LOSS:  Minimal.  PATHOLOGIC FINDINGS/HISTORY:  Autumn Carson has a long-term history of bilateral shoulder impingement refractory to conservative care.  She underwent a successful left shoulder arthroscopy and subacromial arch decompression February 25, 2001 and has done well.  She now presents for the right shoulder.  She had no SLAP lesion, some fraying of the undersurface of the rotator cuff, biceps was intact, the rotator cuff was intact though frayed, she had a sharp anterior acromion and thick subdeltoid bursitis.  Irrigated acromioplasty was carried out in the manner of Vincent Gros with anterior acromioplasty, distal clavicle resection, and subdeltoid bursectomy.  There was not much need for debridement of the glenohumeral joint other than superior labrum, which was lightly shaved and ablated.  DESCRIPTION OF PROCEDURE:  With adequate anesthesia obtained using endotracheal technique, 1 g Ancef given IV  prophylaxis.  The patient was placed in a supine beach chair position.  The right shoulder was prepped and draped in the standard fashion.  Skin markings were made for anatomic positioning and the patient was obese.  I then injected the subacromial space with 20 cc of 0.5% Marcaine with epinephrine to open up the space.  The shoulder was then entered through a posterior portal, anterior portal was established just lateral to the coracoid.  We then entered the glenohumeral joint were a light shaving superiorly was carried out after probing of the labrum and it was felt to be intact.  Ablation was used on some of the frayed edges.  I then reversed portals and did similar shavings.  I then entered the subacromial space from the posterior portal.  Anterolateral portal was established.  Soft tissue was then removed from the anterior undersurface of the acromion and the CA ligament was resected along with anterior acromioplasty carrying it out with 6.0 bur in the manner of Caspari to the roof of the subacromial space.  I then turned the scope sideways and debrided with the basket through the anterior portal the AC meniscus, then a 6.0 bur was brought in and distal clavicle resection was carried out two shaverbreadths in.  Then, I placed the scope in the anterolateral portal, completed the acromioplasty back to the bicortical bone in the manner of Caspari and completed the shaving on the distal clavicle. I then, in all rotations, shaved the bursa off the rotator cuff and lightly ablated to smooth.  There was no rotator cuff through-and-through tear.  When I was satisfied with the resection, the shoulder was irrigated through the scope  and 0.5% Marcaine injected in and about the portals.  The portals were left open. A bulky sterile compressive dressing was applied with sling and the patient having tolerated procedure well, was awakened, taken to recovery room in satisfactory condition to be  discharged per outpatient routine.  Told to call the office for recheck tomorrow, given Tylox for pain.  Laboratory data was within normal limits. Dictated by:   Jearld Adjutant, M.D. Attending Physician:  Drema Pry DD:  07/31/01 TD:  07/31/01 Job: [id number]

## 2011-01-26 ENCOUNTER — Other Ambulatory Visit: Payer: Self-pay | Admitting: Internal Medicine

## 2011-01-26 DIAGNOSIS — C73 Malignant neoplasm of thyroid gland: Secondary | ICD-10-CM

## 2011-02-01 ENCOUNTER — Ambulatory Visit
Admission: RE | Admit: 2011-02-01 | Discharge: 2011-02-01 | Disposition: A | Discharge status: Home / Self Care | Payer: Medicare Other | Source: Ambulatory Visit | Attending: Internal Medicine | Admitting: Internal Medicine

## 2011-02-01 DIAGNOSIS — C73 Malignant neoplasm of thyroid gland: Secondary | ICD-10-CM

## 2011-06-18 ENCOUNTER — Other Ambulatory Visit (HOSPITAL_COMMUNITY): Payer: Self-pay | Admitting: Family Medicine

## 2011-06-18 DIAGNOSIS — R1011 Right upper quadrant pain: Secondary | ICD-10-CM

## 2011-06-20 ENCOUNTER — Inpatient Hospital Stay (HOSPITAL_COMMUNITY): Admission: RE | Admit: 2011-06-20 | Payer: Medicare Other | Source: Ambulatory Visit

## 2011-06-27 ENCOUNTER — Ambulatory Visit (HOSPITAL_COMMUNITY)
Admission: RE | Admit: 2011-06-27 | Discharge: 2011-06-27 | Disposition: A | Discharge status: Home / Self Care | Payer: Medicare Other | Source: Ambulatory Visit | Attending: Family Medicine | Admitting: Family Medicine

## 2011-06-27 DIAGNOSIS — R1011 Right upper quadrant pain: Secondary | ICD-10-CM | POA: Insufficient documentation

## 2011-06-27 DIAGNOSIS — Q619 Cystic kidney disease, unspecified: Secondary | ICD-10-CM | POA: Insufficient documentation

## 2011-08-06 ENCOUNTER — Encounter: Payer: Self-pay | Admitting: *Deleted

## 2011-08-06 NOTE — Telephone Encounter (Signed)
Not a pt at [facility].

## 2011-08-13 ENCOUNTER — Other Ambulatory Visit: Payer: Self-pay | Admitting: Internal Medicine

## 2011-08-13 DIAGNOSIS — C73 Malignant neoplasm of thyroid gland: Secondary | ICD-10-CM

## 2011-08-13 MED ORDER — THYROTROPIN ALFA 1.1 MG IM SOLR: 0.9000 mg | INTRAMUSCULAR | Status: DC

## 2011-08-28 ENCOUNTER — Encounter (INDEPENDENT_AMBULATORY_CARE_PROVIDER_SITE_OTHER): Payer: Self-pay

## 2011-09-03 ENCOUNTER — Encounter (HOSPITAL_COMMUNITY)
Admission: RE | Admit: 2011-09-03 | Discharge: 2011-09-03 | Disposition: A | Discharge status: Home / Self Care | Payer: Medicare Other | Source: Ambulatory Visit | Attending: Internal Medicine | Admitting: Internal Medicine

## 2011-09-03 DIAGNOSIS — C73 Malignant neoplasm of thyroid gland: Secondary | ICD-10-CM

## 2011-09-03 MED ORDER — THYROTROPIN ALFA 1.1 MG IM SOLR
0.9000 mg | INTRAMUSCULAR | Status: AC
  Administered 2011-09-03: 0.9 mg via INTRAMUSCULAR
  Filled 2011-09-03: qty 0.9

## 2011-09-04 ENCOUNTER — Ambulatory Visit (HOSPITAL_COMMUNITY)
Admission: RE | Admit: 2011-09-04 | Discharge: 2011-09-04 | Disposition: A | Discharge status: Home / Self Care | Payer: Medicare Other | Source: Ambulatory Visit | Attending: Internal Medicine | Admitting: Internal Medicine

## 2011-09-04 DIAGNOSIS — C73 Malignant neoplasm of thyroid gland: Secondary | ICD-10-CM | POA: Insufficient documentation

## 2011-09-04 MED ORDER — THYROTROPIN ALFA 1.1 MG IM SOLR
0.9000 mg | INTRAMUSCULAR | Status: AC
  Administered 2011-09-04: 0.9 mg via INTRAMUSCULAR
  Filled 2011-09-04: qty 0.9

## 2011-09-05 ENCOUNTER — Ambulatory Visit (HOSPITAL_COMMUNITY)
Admission: RE | Admit: 2011-09-05 | Discharge: 2011-09-05 | Disposition: A | Discharge status: Home / Self Care | Payer: Medicare Other | Source: Ambulatory Visit | Attending: Internal Medicine | Admitting: Internal Medicine

## 2011-09-05 DIAGNOSIS — C73 Malignant neoplasm of thyroid gland: Secondary | ICD-10-CM | POA: Insufficient documentation

## 2011-09-07 ENCOUNTER — Ambulatory Visit (HOSPITAL_COMMUNITY)
Admission: RE | Admit: 2011-09-07 | Discharge: 2011-09-07 | Disposition: A | Discharge status: Home / Self Care | Payer: Medicare Other | Source: Ambulatory Visit | Attending: Internal Medicine | Admitting: Internal Medicine

## 2011-09-07 DIAGNOSIS — C73 Malignant neoplasm of thyroid gland: Secondary | ICD-10-CM | POA: Insufficient documentation

## 2011-09-07 MED ORDER — SODIUM IODIDE I 131 CAPSULE
4.0000 | Freq: Once | INTRAVENOUS | Status: AC | PRN
  Administered 2011-09-07: 4 via ORAL

## 2011-09-10 LAB — THYROGLOBULIN ANTIBODY: Thyroglobulin Ab: <20 U/mL (ref <40.0)

## 2011-09-10 LAB — THYROGLOBULIN LEVEL: Thyroglobulin: <0.2 ng/mL (ref 0.0–55.0)

## 2011-10-29 ENCOUNTER — Other Ambulatory Visit: Payer: Self-pay

## 2011-10-29 ENCOUNTER — Emergency Department (HOSPITAL_COMMUNITY): Payer: Medicare Other

## 2011-10-29 ENCOUNTER — Encounter (HOSPITAL_COMMUNITY): Payer: Self-pay | Admitting: Emergency Medicine

## 2011-10-29 ENCOUNTER — Emergency Department (HOSPITAL_COMMUNITY)
Admission: EM | Admit: 2011-10-29 | Discharge: 2011-10-29 | Disposition: A | Discharge status: Home / Self Care | Payer: Medicare Other | Attending: Emergency Medicine | Admitting: Emergency Medicine

## 2011-10-29 DIAGNOSIS — R0602 Shortness of breath: Secondary | ICD-10-CM | POA: Insufficient documentation

## 2011-10-29 DIAGNOSIS — I509 Heart failure, unspecified: Secondary | ICD-10-CM | POA: Insufficient documentation

## 2011-10-29 DIAGNOSIS — E119 Type 2 diabetes mellitus without complications: Secondary | ICD-10-CM | POA: Insufficient documentation

## 2011-10-29 DIAGNOSIS — J45909 Unspecified asthma, uncomplicated: Secondary | ICD-10-CM | POA: Insufficient documentation

## 2011-10-29 DIAGNOSIS — Z79899 Other long term (current) drug therapy: Secondary | ICD-10-CM | POA: Insufficient documentation

## 2011-10-29 DIAGNOSIS — I10 Essential (primary) hypertension: Secondary | ICD-10-CM | POA: Insufficient documentation

## 2011-10-29 DIAGNOSIS — J069 Acute upper respiratory infection, unspecified: Secondary | ICD-10-CM | POA: Insufficient documentation

## 2011-10-29 DIAGNOSIS — R05 Cough: Secondary | ICD-10-CM

## 2011-10-29 DIAGNOSIS — R059 Cough, unspecified: Secondary | ICD-10-CM

## 2011-10-29 HISTORY — DX: Obesity, unspecified: E66.9

## 2011-10-29 LAB — DIFFERENTIAL
Basophils Absolute: 0 10*3/uL (ref 0.0–0.1)
Basophils Relative: 0 % (ref 0–1)
Eosinophils Absolute: 0.2 10*3/uL (ref 0.0–0.7)
Eosinophils Relative: 5 % (ref 0–5)
Lymphocytes Relative: 47 % — ABNORMAL HIGH (ref 12–46)
Lymphs Abs: 2.1 10*3/uL (ref 0.7–4.0)
Monocytes Absolute: 0.4 10*3/uL (ref 0.1–1.0)
Monocytes Relative: 9 % (ref 3–12)
Neutro Abs: 1.7 10*3/uL (ref 1.7–7.7)
Neutrophils Relative %: 39 % — ABNORMAL LOW (ref 43–77)

## 2011-10-29 LAB — BASIC METABOLIC PANEL
BUN: 12 mg/dL (ref 6–23)
CO2: 26 mEq/L (ref 19–32)
Calcium: 8.8 mg/dL (ref 8.4–10.5)
Chloride: 101 mEq/L (ref 96–112)
Creatinine, Ser: 0.85 mg/dL (ref 0.50–1.10)
GFR calc Af Amer: 89 mL/min — ABNORMAL LOW (ref >90)
GFR calc non Af Amer: 77 mL/min — ABNORMAL LOW (ref >90)
Glucose, Bld: 129 mg/dL — ABNORMAL HIGH (ref 70–99)
Potassium: 3.6 mEq/L (ref 3.5–5.1)
Sodium: 140 mEq/L (ref 135–145)

## 2011-10-29 LAB — CBC
HCT: 34.6 % — ABNORMAL LOW (ref 36.0–46.0)
Hemoglobin: 10.9 g/dL — ABNORMAL LOW (ref 12.0–15.0)
MCH: 24.6 pg — ABNORMAL LOW (ref 26.0–34.0)
MCHC: 31.5 g/dL (ref 30.0–36.0)
MCV: 78.1 fL (ref 78.0–100.0)
Platelets: 264 10*3/uL (ref 150–400)
RBC: 4.43 MIL/uL (ref 3.87–5.11)
RDW: 15.6 % — ABNORMAL HIGH (ref 11.5–15.5)
WBC: 4.5 10*3/uL (ref 4.0–10.5)

## 2011-10-29 LAB — POCT I-STAT TROPONIN I: Troponin i, poc: 0 ng/mL (ref 0.00–0.08)

## 2011-10-29 LAB — PRO B NATRIURETIC PEPTIDE: Pro B Natriuretic peptide (BNP): 18.4 pg/mL (ref 0–125)

## 2011-10-29 MED ORDER — GUAIFENESIN-CODEINE 100-10 MG/5ML PO SYRP: 5.0000 mL | ORAL_SOLUTION | Freq: Three times a day (TID) | ORAL | Status: AC | PRN

## 2011-10-29 NOTE — ED Notes (Signed)
PT. REPORTS PROGRESSING SOB WITH PRODUCTIVE COUGH ,NASAL CONGESTION , CHEST CONGESTION " BURNING" AND CHILLS FOR SEVERAL DAYS .

## 2011-10-29 NOTE — ED Provider Notes (Signed)
History    55yF with cc of cough. Onset several days ago. Mild dyspnea. Subjective fever. No n/v. No unusual leg pain or swelling. Denies hx of blood clot. Cough productive at times. Feel congested. Denies cp. No rash. Hx fo asthma but says feel different.   CSN: [id number]  Arrival date & time 10/29/11  0504   First MD Initiated Contact with Patient 10/29/11 [phone]      Chief Complaint  Patient presents with  . Shortness of Breath    (Consider location/radiation/quality/duration/timing/severity/associated sxs/prior treatment) HPI  Past Medical History  Diagnosis Date  . Neuropathy     due to diabetes  . Ulcer   . Diabetes mellitus     II  . Hypertension   . CHF (congestive heart failure)   . Asthma   . Obesity     Past Surgical History  Procedure Date  . Cesarean section   . Tubal ligation   . Tonsilectomy, adenoidectomy, bilateral myringotomy and tubes 1987  . Carpal tunnel release   . Rotator cuff repair     Family History  Problem Relation Age of Onset  . Heart disease Mother   . Cancer Father     History  Substance Use Topics  . Smoking status: Never Smoker   . Smokeless tobacco: Not on file  . Alcohol Use: No    OB History    Grav Para Term Preterm Abortions TAB SAB Ect Mult Living                  Review of Systems   Review of symptoms negative unless otherwise noted in HPI.   Allergies  Review of patient's allergies indicates not on file.  Home Medications   Current Outpatient Rx  Name Route Sig Dispense Refill  . ASPIRIN 81 MG PO TABS Oral Take 81 mg by mouth daily.      . CHLORTHALIDONE 25 MG PO TABS Oral Take 25 mg by mouth daily.      Marland Kitchen FAMOTIDINE 20 MG PO TABS Oral Take 20 mg by mouth 2 (two) times daily.      Marland Kitchen FERROUS SULFATE 325 (65 FE) MG PO TABS Oral Take 325 mg by mouth daily.      Marland Kitchen FLUOXETINE HCL 40 MG PO CAPS Oral Take 40 mg by mouth daily.      Marland Kitchen GABAPENTIN 300 MG PO CAPS Oral Take 300 mg by mouth 2 (two) times daily.       Marland Kitchen GEMFIBROZIL 600 MG PO TABS Oral Take 600 mg by mouth 2 (two) times daily before a meal.      . GLIMEPIRIDE 4 MG PO TABS Oral Take 4 mg by mouth 2 (two) times daily.      Marland Kitchen LISINOPRIL 20 MG PO TABS Oral Take 20 mg by mouth daily.      Marland Kitchen LORATADINE 10 MG PO TABS Oral Take 10 mg by mouth daily.      Marland Kitchen METFORMIN HCL PO Oral Take 1,000 mg by mouth 2 (two) times daily.      Marland Kitchen SIMVASTATIN 40 MG PO TABS Oral Take 40 mg by mouth daily.      . THYROTROPIN ALFA 1.1 MG IM SOLR Intramuscular Inject 0.9 mg into the muscle daily. Two doses prior to diagnostic radioactive iodine whole body scan and lab visit for thyroglobulin level. 0.9 mg 0    Two doses prior to diagnostic radioactive iodine w ...    BP 120/76  Pulse 75  Temp(Src)  98.1 F (36.7 C) (Oral)  Resp 18  SpO2 100%  LMP 09/07/2011  Physical Exam  Nursing note and vitals reviewed. Constitutional: She appears well-developed and well-nourished. No distress.       Laying in bed. NAD.  HENT:  Head: Normocephalic and atraumatic.  Eyes: Conjunctivae are normal. Right eye exhibits no discharge. Left eye exhibits no discharge.  Neck: Neck supple.  Cardiovascular: Normal rate, regular rhythm and normal heart sounds.  Exam reveals no gallop and no friction rub.   No murmur heard. Pulmonary/Chest: Effort normal and breath sounds normal. No respiratory distress.  Abdominal: Soft. She exhibits no distension. There is no tenderness.  Musculoskeletal: She exhibits no edema and no tenderness.  Neurological: She is alert.  Skin: Skin is warm and dry.  Psychiatric: She has a normal mood and affect. Her behavior is normal. Thought content normal.     Review of symptoms negative unless otherwise noted in HPI.   ED Course  Procedures (including critical care time)  Labs Reviewed  CBC - Abnormal; Notable for the following:    Hemoglobin 10.9 (*)    HCT 34.6 (*)    MCH 24.6 (*)    RDW 15.6 (*)    All other components within normal limits    DIFFERENTIAL - Abnormal; Notable for the following:    Neutrophils Relative 39 (*)    Lymphocytes Relative 47 (*)    All other components within normal limits  BASIC METABOLIC PANEL - Abnormal; Notable for the following:    Glucose, Bld 129 (*)    GFR calc non Af Amer 77 (*)    GFR calc Af Amer 89 (*)    All other components within normal limits  PRO B NATRIURETIC PEPTIDE  POCT I-STAT TROPONIN I  LAB REPORT - SCANNED   No results found.  Dg Chest 2 View  10/29/2011  *RADIOLOGY REPORT*  Clinical Data: Cough and congestion.  History of prior thyroid cancer.  CHEST - 2 VIEW  Comparison: 04/07/2010  Findings: The heart size and pulmonary vascularity are normal. The lungs appear clear and expanded without focal air space disease or consolidation. No blunting of the costophrenic angles.  Surgical clips in the base of the neck and right upper quadrant. Postoperative changes in the distal right clavicle.  Degenerative changes in the thoracic spine.  IMPRESSION: No evidence of active pulmonary disease.  Original Report Authenticated By: Marlon Pel, M.D.    1. Cough   2. URI (upper respiratory infection)     EKG:  Rhythm: Normal sinus rhythm Rate: 84 Axis: Normal Intervals: First degree AV block. ST segments: Nonspecific ST changes. There is T-wave flattening in aVL. Comparison: Little change from previous EKG from 04/07/2010   MDM  55yf with cough and congestion. Suspect uri, likely viral. Consider acs but doubt  Very atypical given only mild dyspnea and no cp. Trop normal and ekg nonprovocative.  cxr clear. No respiratory distress on exam. Consider PE but doubt. No clinical evidence of chf. Plan symptomatic tx and outpt fu.         Raeford Razor, MD 10/31/11 2200

## 2011-10-29 NOTE — Discharge Instructions (Signed)
Antibiotic Nonuse  Your caregiver felt that the infection or problem was not one that would be helped with an antibiotic. Infections may be caused by viruses or bacteria. Only a caregiver can tell which one of these is the likely cause of an illness. A cold is the most common cause of infection in both adults and children. A cold is a virus. Antibiotic treatment will have no effect on a viral infection. Viruses can lead to many lost days of work caring for sick children and many missed days of school. Children may catch as many as 10 "colds" or "flus" per year during which they can be tearful, cranky, and uncomfortable. The goal of treating a virus is aimed at keeping the ill person comfortable. Antibiotics are medications used to help the body fight bacterial infections. There are relatively few types of bacteria that cause infections but there are hundreds of viruses. While both viruses and bacteria cause infection they are very different types of germs. A viral infection will typically go away by itself within 7 to 10 days. Bacterial infections may spread or get worse without antibiotic treatment. Examples of bacterial infections are:  Sore throats (like strep throat or tonsillitis).   Infection in the lung (pneumonia).   Ear and skin infections.  Examples of viral infections are:  Colds or flus.   Most coughs and bronchitis.   Sore throats not caused by Strep.   Runny noses.  It is often best not to take an antibiotic when a viral infection is the cause of the problem. Antibiotics can kill off the helpful bacteria that we have inside our body and allow harmful bacteria to start growing. Antibiotics can cause side effects such as allergies, nausea, and diarrhea without helping to improve the symptoms of the viral infection. Additionally, repeated uses of antibiotics can cause bacteria inside of our body to become resistant. That resistance can be passed onto harmful bacterial. The next time  you have an infection it may be harder to treat if antibiotics are used when they are not needed. Not treating with antibiotics allows our own immune system to develop and take care of infections more efficiently. Also, antibiotics will work better for [place] when they are prescribed for bacterial infections. Treatments for a child that is ill may include:  Give extra fluids throughout the day to stay hydrated.   Get plenty of rest.   Only give your child over-the-counter or prescription medicines for pain, discomfort, or fever as directed by your caregiver.   The use of a cool mist humidifier may help stuffy noses.   Cold medications if suggested by your caregiver.  Your caregiver may decide to start you on an antibiotic if:  The problem you were seen for today continues for a longer length of time than expected.   You develop a secondary bacterial infection.  SEEK MEDICAL CARE IF:  Fever lasts longer than 5 days.   Symptoms continue to get worse after 5 to 7 days or become severe.   Difficulty in breathing develops.   Signs of dehydration develop (poor drinking, rare urinating, dark colored urine).   Changes in behavior or worsening tiredness (listlessness or lethargy).  Document Released: 09/24/2001 Document Revised: 07/05/2011 Document Reviewed: 03/23/2009 [facility] Patient Information 2012 [place], [place].Cough, Adult  A cough is a reflex. It helps you clear your throat and airways. A cough can help heal your body. A cough can last 2 or 3 weeks (acute) or may last more than 8  weeks (chronic). Some common causes of a cough can include an infection, allergy, or a cold. HOME CARE  Only take medicine as told by your doctor.   If given, take your medicines (antibiotics) as told. Finish them even if you start to feel better.   Use a cold steam vaporizer or humidier in your home. This can help loosen thick spit (secretions).   Sleep so you are almost sitting up (semi-upright). Use  pillows to do this. This helps reduce coughing.   Rest as needed.   Stop smoking if you smoke.  GET HELP RIGHT AWAY IF:  You have yellowish-white fluid (pus) in your thick spit.   Your cough gets worse.   Your medicine does not reduce coughing, and you are losing sleep.   You cough up blood.   You have trouble breathing.   Your pain gets worse and medicine does not help.   You have a fever.  MAKE SURE YOU:   Understand these instructions.   Will watch your condition.   Will get help right away if you are not doing well or get worse.  Document Released: 03/29/2011 Document Revised: 07/05/2011 Document Reviewed: 03/29/2011 [facility] Patient Information 2012 [place], [place].Antibiotic Treatment and Drug Resistance Antibiotics are very helpful drugs. They are used to treat bacterial infections. But they do not help viral infections. Because antibiotics are used so often, some bacteria do not respond to them. Respiratory illnesses are mostly caused by viral infections. These will get better without using antibiotics. If an antibiotic is prescribed for a viral illness, a patient may notice bad side effects. Some of these may be:  Allergic reactions.   Diarrhea.   Abdominal discomfort.   Vomiting.   Yeast infection.  Antibiotics also can be expensive. Paying for an antibiotic to treat a viral illness is both a medical risk and a waste of money. The use of antibiotics has fostered the growth of resistant bacteria. These bacteria can cause infections which are harder to treat. For these reasons, doctors are more selective in prescribing antibiotics. If you have not received an antibiotic today, be sure to follow up as directed if you are not improving. If you have received an antibiotic, take it as directed. Finish the full course. Keep your caregiver informed of any side effects. Document Released: 08/23/2004 Document Revised: 07/05/2011 Document Reviewed: 07/16/2005 [facility]  Patient Information 2012 [place], [place].Upper Respiratory Infection, Adult An upper respiratory infection (URI) is also known as the common cold. It is often caused by a type of germ (virus). Colds are easily spread (contagious). You can pass it to others by kissing, coughing, sneezing, or drinking out of the same glass. Usually, you get better in 1 or 2 weeks.  HOME CARE   Only take medicine as told by your doctor.   Use a warm mist humidifier or breathe in steam from a hot shower.   Drink enough water and fluids to keep your pee (urine) clear or pale yellow.   Get plenty of rest.   Return to work when your temperature is back to normal or as told by your doctor. You may use a face mask and wash your hands to stop your cold from spreading.  GET HELP RIGHT AWAY IF:   After the first few days, you feel you are getting worse.   You have questions about your medicine.   You have chills, shortness of breath, or brown or red spit (mucus).   You have yellow or brown snot (  nasal discharge) or pain in the face, especially when you bend forward.   You have a fever, puffy (swollen) neck, pain when you swallow, or white spots in the back of your throat.   You have a bad headache, ear pain, sinus pain, or chest pain.   You have a high-pitched whistling sound when you breathe in and out (wheezing).   You have a lasting cough or cough up blood.   You have sore muscles or a stiff neck.  MAKE SURE YOU:   Understand these instructions.   Will watch your condition.   Will get help right away if you are not doing well or get worse.  Document Released: 01/02/2008 Document Revised: 07/05/2011 Document Reviewed: 11/20/2010 [facility] Patient Information 2012 [place], [place].

## 2012-06-12 ENCOUNTER — Ambulatory Visit (INDEPENDENT_AMBULATORY_CARE_PROVIDER_SITE_OTHER): Payer: Medicare Other | Admitting: Family Medicine

## 2012-06-12 ENCOUNTER — Encounter: Payer: Self-pay | Admitting: Family Medicine

## 2012-06-12 VITALS — BP 138/80 | HR 58 | Resp 16 | Ht 66.0 in | Wt 299.0 lb

## 2012-06-12 DIAGNOSIS — I509 Heart failure, unspecified: Secondary | ICD-10-CM

## 2012-06-12 DIAGNOSIS — G629 Polyneuropathy, unspecified: Secondary | ICD-10-CM

## 2012-06-12 DIAGNOSIS — I10 Essential (primary) hypertension: Secondary | ICD-10-CM

## 2012-06-12 DIAGNOSIS — E119 Type 2 diabetes mellitus without complications: Secondary | ICD-10-CM

## 2012-06-12 DIAGNOSIS — M7989 Other specified soft tissue disorders: Secondary | ICD-10-CM

## 2012-06-12 DIAGNOSIS — G609 Hereditary and idiopathic neuropathy, unspecified: Secondary | ICD-10-CM

## 2012-06-12 DIAGNOSIS — E785 Hyperlipidemia, unspecified: Secondary | ICD-10-CM

## 2012-06-12 DIAGNOSIS — Z1239 Encounter for other screening for malignant neoplasm of breast: Secondary | ICD-10-CM

## 2012-06-12 DIAGNOSIS — E1140 Type 2 diabetes mellitus with diabetic neuropathy, unspecified: Secondary | ICD-10-CM | POA: Insufficient documentation

## 2012-06-12 DIAGNOSIS — Z1231 Encounter for screening mammogram for malignant neoplasm of breast: Secondary | ICD-10-CM

## 2012-06-12 LAB — COMPREHENSIVE METABOLIC PANEL
ALT: 11 U/L (ref 0–35)
AST: 12 U/L (ref 0–37)
CO2: 29 mEq/L (ref 19–32)
Sodium: 138 mEq/L (ref 135–145)
Total Bilirubin: 0.3 mg/dL (ref 0.3–1.2)
Total Protein: 6.6 g/dL (ref 6.0–8.3)

## 2012-06-12 LAB — CBC
MCH: 24.9 pg — ABNORMAL LOW (ref 26.0–34.0)
MCHC: 32 g/dL (ref 30.0–36.0)
MCV: 77.9 fL — ABNORMAL LOW (ref 78.0–100.0)
Platelets: 304 10*3/uL (ref 150–400)
RDW: 15.5 % (ref 11.5–15.5)
WBC: 5.4 10*3/uL (ref 4.0–10.5)

## 2012-06-12 LAB — LIPID PANEL
HDL: 50 mg/dL (ref >39)
LDL Cholesterol: 98 mg/dL (ref 0–99)

## 2012-06-12 MED ORDER — FUROSEMIDE 20 MG PO TABS: 20.0000 mg | ORAL_TABLET | Freq: Every day | ORAL | Status: DC

## 2012-06-12 NOTE — Assessment & Plan Note (Addendum)
Chart stated CHF, however cardiology note reviewed from 2010 and no diagnosis of this seen, also had ETT which was normal D/c chlorthalidone, change to lasix

## 2012-06-12 NOTE — Progress Notes (Signed)
  Subjective:    Patient ID: Autumn Carson, female    DOB: 27-May-1956, 56 y.o.   MRN: [id number]  HPI Patient here to establish care. Previous PCP health service. Endocrinology [facility]. Sharl Ma Due for mammogram Medications and history reviewed Patient has a history of papillary thyroid carcinoma. She is status post radiation treatments and is followed by endocrinology.  Diabetes mellitus since 1985 she's currently on oral medications. She has lost 55 pounds.  CHF she's a history of CHF noted in chart. She said at one point she had to see a heart Dr. because of fluid overload. She's currently on chlorthalidone at does not find that this helps with the leg swelling very much. Her left leg swells more than the right and she has some arthritis on that side. She's also on lisinopril.  Neuropathy she has bilateral neuropathy in hands secondary to carpal tunnel she also has neuropathy in her feet and is currently on Neurontin twice a day.  Depression/anxiety she was followed by [facility] behavioral health for therapy. She's currently on Prozac and occasionally Ambien and is doing well on this. She has been hospitalized for her depression last in 2001. She's currently separated from her husband has 2 children. Her daughter is 66 year old old and has had a myocardial infarction as well as high blood pressure.   Review of Systems - per above   GEN- denies fatigue, fever, weight loss,weakness, recent illness HEENT- denies eye drainage, change in vision, nasal discharge, CVS- denies chest pain, palpitations, +leg swelling  RESP- denies SOB, cough, wheeze ABD- denies N/V, change in stools, abd pain GU- denies dysuria, hematuria, dribbling, incontinence MSK- denies joint pain, muscle aches, injury Neuro- denies headache, dizziness, syncope, seizure activity       Objective:   Physical Exam GEN- NAD, alert and oriented x3, obese  HEENT- PERRL, EOMI, non injected sclera, pink conjunctiva, MMM,  oropharynx clear, poor dentition Neck- Supple,  CVS- RRR, no murmur RESP-CTAB ABD-NABS,soft,NT,ND EXT- +pedal edema Pulses- Radial, DP- 2+ Psych- normal affect and mood        Assessment & Plan:

## 2012-06-12 NOTE — Assessment & Plan Note (Signed)
Check A1C on oral meds

## 2012-06-12 NOTE — Patient Instructions (Addendum)
Get the labs done today Stop Chlorthalidone Call and schedule your mammogram New water pill Lasix F/U 6 weeks

## 2012-06-12 NOTE — Assessment & Plan Note (Signed)
Continue neurontin 

## 2012-06-12 NOTE — Assessment & Plan Note (Signed)
BP looks okay today, check lytes

## 2012-06-12 NOTE — Assessment & Plan Note (Signed)
Check FLP on zocof

## 2012-06-13 LAB — HEMOGLOBIN A1C
Hgb A1c MFr Bld: 6.6 % — ABNORMAL HIGH (ref <5.7)
Mean Plasma Glucose: 143 mg/dL — ABNORMAL HIGH (ref <117)

## 2012-07-09 ENCOUNTER — Ambulatory Visit (HOSPITAL_COMMUNITY)
Admission: RE | Admit: 2012-07-09 | Discharge: 2012-07-09 | Disposition: A | Discharge status: Home / Self Care | Payer: Medicare Other | Source: Ambulatory Visit | Attending: Family Medicine | Admitting: Family Medicine

## 2012-07-09 ENCOUNTER — Ambulatory Visit (HOSPITAL_COMMUNITY): Payer: Medicare Other

## 2012-07-09 DIAGNOSIS — Z1231 Encounter for screening mammogram for malignant neoplasm of breast: Secondary | ICD-10-CM | POA: Insufficient documentation

## 2012-07-09 DIAGNOSIS — Z1239 Encounter for other screening for malignant neoplasm of breast: Secondary | ICD-10-CM

## 2012-07-10 ENCOUNTER — Telehealth: Payer: Self-pay | Admitting: Family Medicine

## 2012-07-22 ENCOUNTER — Telehealth: Payer: Self-pay | Admitting: Family Medicine

## 2012-07-22 ENCOUNTER — Other Ambulatory Visit: Payer: Self-pay

## 2012-07-22 MED ORDER — OMEPRAZOLE 40 MG PO CPDR: 40.0000 mg | DELAYED_RELEASE_CAPSULE | Freq: Every day | ORAL | Status: DC

## 2012-07-22 NOTE — Telephone Encounter (Signed)
Med refilled.

## 2012-07-25 NOTE — Telephone Encounter (Signed)
Stated she needed a refill on omeprazole 40 and it was refilled

## 2012-07-29 ENCOUNTER — Ambulatory Visit (INDEPENDENT_AMBULATORY_CARE_PROVIDER_SITE_OTHER): Payer: Medicare Other | Admitting: Family Medicine

## 2012-07-29 ENCOUNTER — Encounter: Payer: Self-pay | Admitting: Family Medicine

## 2012-07-29 VITALS — BP 136/88 | HR 60 | Resp 18 | Ht 66.0 in | Wt 301.1 lb

## 2012-07-29 DIAGNOSIS — F341 Dysthymic disorder: Secondary | ICD-10-CM

## 2012-07-29 DIAGNOSIS — E119 Type 2 diabetes mellitus without complications: Secondary | ICD-10-CM

## 2012-07-29 DIAGNOSIS — M25569 Pain in unspecified knee: Secondary | ICD-10-CM

## 2012-07-29 DIAGNOSIS — I10 Essential (primary) hypertension: Secondary | ICD-10-CM

## 2012-07-29 DIAGNOSIS — F418 Other specified anxiety disorders: Secondary | ICD-10-CM

## 2012-07-29 DIAGNOSIS — G609 Hereditary and idiopathic neuropathy, unspecified: Secondary | ICD-10-CM

## 2012-07-29 DIAGNOSIS — M549 Dorsalgia, unspecified: Secondary | ICD-10-CM

## 2012-07-29 DIAGNOSIS — M1990 Unspecified osteoarthritis, unspecified site: Secondary | ICD-10-CM

## 2012-07-29 DIAGNOSIS — G629 Polyneuropathy, unspecified: Secondary | ICD-10-CM

## 2012-07-29 DIAGNOSIS — E785 Hyperlipidemia, unspecified: Secondary | ICD-10-CM

## 2012-07-29 MED ORDER — NAPROXEN 500 MG PO TABS: 500.0000 mg | ORAL_TABLET | Freq: Two times a day (BID) | ORAL | Status: DC

## 2012-07-29 NOTE — Patient Instructions (Addendum)
Start anti-inflammatory medication twice a day with food for back pain, inflammation and bursitis Continue other medications Add fish oil 1 tablet a day  Referral to psychiatry Schedule a PAP exam f/u 4 weeks

## 2012-07-29 NOTE — Assessment & Plan Note (Signed)
OA and morbid obesity, worsening knees, discussed importance of any activity, start NSAIDS

## 2012-07-29 NOTE — Progress Notes (Signed)
  Subjective:    Patient ID: Autumn Carson, female    DOB: [birth year], 56 y.o.   MRN: [id number]  HPI Pt here to f/u chronic medical problems Pain all over for many months, feels stiff in muscles and lower back and knees after sitting for long periods of time, took an NSAID from her son that helped, no injury, has gained some weight and has not been walking as much as previous DM- Taking metformin CBG less than 120, no hypoglycemia Labs reviewed with patient COlonoscopy- had a free screening a few years ago, but no report available Feels more stress with finances and family and wants to return to counseling, has episodes where she cries at night alone,no SI  Review of Systems   GEN- denies fatigue, fever, weight loss,weakness, recent illness HEENT- denies eye drainage, change in vision, nasal discharge, CVS- denies chest pain, palpitations RESP- denies SOB, cough, wheeze ABD- denies N/V, change in stools, abd pain GU- denies dysuria, hematuria, dribbling, incontinence MSK- + joint pain, muscle aches, injury Neuro- denies headache, dizziness, syncope, seizure activity      Objective:   Physical Exam GEN- NAD, alert and oriented x3, obese  HEENT- PERRL, EOMI, non injected sclera, pink conjunctiva, MMM, oropharynx clear, poor dentition Neck- Supple,  CVS- RRR, no murmur RESP-CTAB Back- spine NT, neg SLR Knee- large habitus, no appreciable effusion, ligaments in tact, decreased ROM at joints MSK- No point tenderness EXT- +pedal edema Pulses- Radial, DP- 2+ Psych- normal affect and mood       Assessment & Plan:

## 2012-07-29 NOTE — Assessment & Plan Note (Signed)
Continue current meds, refer for therapy, no harm to self

## 2012-07-29 NOTE — Assessment & Plan Note (Signed)
Well controlled 

## 2012-07-29 NOTE — Assessment & Plan Note (Signed)
Diastolic elevated some, no change to meds

## 2012-07-29 NOTE — Assessment & Plan Note (Signed)
Likley cause of joint pain

## 2012-07-29 NOTE — Assessment & Plan Note (Signed)
TG increased some, add fish oil daily

## 2012-07-29 NOTE — Assessment & Plan Note (Signed)
Due to diabetes and carpal tunnel, fairly good control

## 2012-07-29 NOTE — Assessment & Plan Note (Signed)
No red flags, on gabapentin, add NSAID

## 2012-08-11 ENCOUNTER — Other Ambulatory Visit: Payer: Self-pay

## 2012-08-11 MED ORDER — LISINOPRIL 40 MG PO TABS: 40.0000 mg | ORAL_TABLET | Freq: Every day | ORAL | Status: DC

## 2012-08-13 ENCOUNTER — Ambulatory Visit (INDEPENDENT_AMBULATORY_CARE_PROVIDER_SITE_OTHER): Payer: 59 | Admitting: Psychiatry

## 2012-08-13 ENCOUNTER — Encounter (HOSPITAL_COMMUNITY): Payer: Self-pay | Admitting: Psychiatry

## 2012-08-13 DIAGNOSIS — F329 Major depressive disorder, single episode, unspecified: Secondary | ICD-10-CM

## 2012-08-13 DIAGNOSIS — F419 Anxiety disorder, unspecified: Secondary | ICD-10-CM

## 2012-08-13 DIAGNOSIS — F411 Generalized anxiety disorder: Secondary | ICD-10-CM

## 2012-08-18 ENCOUNTER — Ambulatory Visit (HOSPITAL_COMMUNITY): Payer: Self-pay | Admitting: Psychiatry

## 2012-08-18 ENCOUNTER — Other Ambulatory Visit: Payer: Self-pay | Admitting: Internal Medicine

## 2012-08-18 DIAGNOSIS — C73 Malignant neoplasm of thyroid gland: Secondary | ICD-10-CM

## 2012-08-21 ENCOUNTER — Other Ambulatory Visit: Payer: Self-pay

## 2012-08-21 MED ORDER — FUROSEMIDE 20 MG PO TABS: 20.0000 mg | ORAL_TABLET | Freq: Every day | ORAL | Status: DC
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
## 2012-08-28 NOTE — Patient Instructions (Signed)
Discussed orally 

## 2012-08-29 ENCOUNTER — Ambulatory Visit (HOSPITAL_COMMUNITY): Payer: Self-pay | Admitting: Psychiatry

## 2012-09-03 ENCOUNTER — Ambulatory Visit
Admission: RE | Admit: 2012-09-03 | Discharge: 2012-09-03 | Disposition: A | Discharge status: Home / Self Care | Payer: Medicare Other | Source: Ambulatory Visit | Attending: Internal Medicine | Admitting: Internal Medicine

## 2012-09-03 DIAGNOSIS — C73 Malignant neoplasm of thyroid gland: Secondary | ICD-10-CM

## 2012-09-10 ENCOUNTER — Ambulatory Visit (INDEPENDENT_AMBULATORY_CARE_PROVIDER_SITE_OTHER): Payer: 59 | Admitting: Psychiatry

## 2012-09-10 ENCOUNTER — Other Ambulatory Visit: Payer: Self-pay

## 2012-09-10 DIAGNOSIS — F339 Major depressive disorder, recurrent, unspecified: Secondary | ICD-10-CM

## 2012-09-10 DIAGNOSIS — F411 Generalized anxiety disorder: Secondary | ICD-10-CM

## 2012-09-10 MED ORDER — FUROSEMIDE 20 MG PO TABS: 20.0000 mg | ORAL_TABLET | Freq: Every day | ORAL | Status: DC

## 2012-09-10 MED ORDER — LISINOPRIL 40 MG PO TABS: 40.0000 mg | ORAL_TABLET | Freq: Every day | ORAL | Status: DC

## 2012-09-10 MED ORDER — GABAPENTIN 300 MG PO CAPS: 300.0000 mg | ORAL_CAPSULE | Freq: Two times a day (BID) | ORAL | Status: DC

## 2012-09-10 MED ORDER — OMEPRAZOLE 40 MG PO CPDR: 40.0000 mg | DELAYED_RELEASE_CAPSULE | Freq: Every day | ORAL | Status: DC

## 2012-09-10 MED ORDER — FLUOXETINE HCL 40 MG PO CAPS: 40.0000 mg | ORAL_CAPSULE | Freq: Every day | ORAL | Status: DC

## 2012-09-10 MED ORDER — NAPROXEN 500 MG PO TABS: 500.0000 mg | ORAL_TABLET | Freq: Two times a day (BID) | ORAL | Status: DC
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
## 2012-09-10 NOTE — Patient Instructions (Signed)
Discussed orally 

## 2012-09-15 ENCOUNTER — Encounter: Payer: Self-pay | Admitting: Family Medicine

## 2012-09-15 ENCOUNTER — Ambulatory Visit (INDEPENDENT_AMBULATORY_CARE_PROVIDER_SITE_OTHER): Payer: Medicare Other | Admitting: Family Medicine

## 2012-09-15 ENCOUNTER — Ambulatory Visit (HOSPITAL_COMMUNITY): Payer: Medicare Other

## 2012-09-15 ENCOUNTER — Other Ambulatory Visit (HOSPITAL_COMMUNITY): Payer: Self-pay

## 2012-09-15 VITALS — BP 148/90 | HR 58 | Resp 16 | Ht 66.0 in | Wt 307.8 lb

## 2012-09-15 DIAGNOSIS — R51 Headache: Secondary | ICD-10-CM

## 2012-09-15 DIAGNOSIS — M25522 Pain in left elbow: Secondary | ICD-10-CM | POA: Insufficient documentation

## 2012-09-15 DIAGNOSIS — R519 Headache, unspecified: Secondary | ICD-10-CM | POA: Insufficient documentation

## 2012-09-15 DIAGNOSIS — M25529 Pain in unspecified elbow: Secondary | ICD-10-CM

## 2012-09-15 DIAGNOSIS — S0990XA Unspecified injury of head, initial encounter: Secondary | ICD-10-CM | POA: Insufficient documentation

## 2012-09-15 MED ORDER — HYDROCODONE-ACETAMINOPHEN 5-325 MG PO TABS
1.0000 | ORAL_TABLET | Freq: Four times a day (QID) | ORAL | Status: DC | PRN
Start: 2012-09-15 — End: 2012-12-14

## 2012-09-15 NOTE — Progress Notes (Signed)
  Subjective:    Patient ID: Autumn Carson, female    DOB: 1955/08/05, 57 y.o.   MRN: [id number]  HPI  patient presents with headache status post fall one week ago. She states that they bought a new mattress and it was higher than normal. On Tuesday she was found on the floor by her husband after falling out of the bed. She believes she hit her head on the corner of the end table since then she has had severe sharp pains that shoot for her white temporal region daily. She also had swelling of right side of face which has resolved. Yesterday pain eased some. Denies any nausea vomiting or change in vision. She also complained of left arm pain which she also hit at the same time. She's been taking aspirin only no other over-the-counter medications. She did not want to go to the emergency room.   Review of Systems   GEN- denies fatigue, fever, weight loss,weakness, recent illness HEENT- denies eye drainage, change in vision, nasal discharge, CVS- denies chest pain, palpitations RESP- denies SOB, cough, wheeze ABD- denies N/V, change in stools, abd pain GU- denies dysuria, hematuria, dribbling, incontinence MSK- + joint pain, muscle aches, injury Neuro- + headache, dizziness, syncope, seizure activity      Objective:   Physical Exam  GEN- NAD, alert and oriented x3 HEENT- PERRL, EOMI, non injected sclera, pink conjunctiva, MMM, oropharynx clear, fundus difficult but appears benign, TM in tact bilat,  TTP right temporal region and anterior auricular Neck- Supple, normal ROM, neg spurlings  CVS- RRR, 1/6 SEM,  RESP-CTAB EXT- No edema Pulses- Radial 2+ Neuro- CNII-XII in tact, no focal deficits  MSK- Left arm normal  Inspection,no swelling noted. Rotator cuff intact, biceps in tact, TTP 1cm above lateral olecranon, pain with pronation of forearm       Assessment & Plan:

## 2012-09-15 NOTE — Assessment & Plan Note (Signed)
Occurred 1 week ago, CT head

## 2012-09-15 NOTE — Patient Instructions (Signed)
Get the CT scan of head Start vicodin for pain  Ice the elbow  Xray of elbow as well  F/U 4 weeks for physical with PAP Smear

## 2012-09-15 NOTE — Assessment & Plan Note (Signed)
Likely soft tissue bruise from fall, obtain xray as head to be scanned

## 2012-09-15 NOTE — Assessment & Plan Note (Signed)
Head injury no focal deficits, takes ASA on regular basis, obtain CT head Vicodin for pain , no concussive symptoms

## 2012-09-15 NOTE — Addendum Note (Signed)
Addended by: Milinda Antis F on: 09/15/2012 11:29 AM   Modules accepted: Orders

## 2012-09-24 ENCOUNTER — Ambulatory Visit (INDEPENDENT_AMBULATORY_CARE_PROVIDER_SITE_OTHER): Payer: 59 | Admitting: Psychiatry

## 2012-09-24 NOTE — Patient Instructions (Signed)
Discussed orally 
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
## 2012-09-30 ENCOUNTER — Telehealth: Payer: Self-pay | Admitting: Family Medicine

## 2012-09-30 NOTE — Telephone Encounter (Signed)
Noted  

## 2012-10-09 ENCOUNTER — Other Ambulatory Visit: Payer: Self-pay | Admitting: Family Medicine

## 2012-10-15 ENCOUNTER — Ambulatory Visit (HOSPITAL_COMMUNITY): Payer: Self-pay | Admitting: Psychiatry

## 2012-10-15 ENCOUNTER — Other Ambulatory Visit: Payer: Self-pay

## 2012-10-15 MED ORDER — OMEPRAZOLE 40 MG PO CPDR: 40.0000 mg | DELAYED_RELEASE_CAPSULE | Freq: Every day | ORAL | Status: DC

## 2012-10-21 ENCOUNTER — Other Ambulatory Visit: Payer: Self-pay | Admitting: Family Medicine

## 2012-10-27 ENCOUNTER — Other Ambulatory Visit: Payer: Self-pay | Admitting: Family Medicine

## 2012-10-30 ENCOUNTER — Ambulatory Visit (INDEPENDENT_AMBULATORY_CARE_PROVIDER_SITE_OTHER): Payer: 59 | Admitting: Psychiatry

## 2012-10-30 DIAGNOSIS — F411 Generalized anxiety disorder: Secondary | ICD-10-CM

## 2012-10-30 DIAGNOSIS — F329 Major depressive disorder, single episode, unspecified: Secondary | ICD-10-CM

## 2012-10-30 NOTE — Patient Instructions (Signed)
Discussed orally 
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
## 2012-11-03 ENCOUNTER — Other Ambulatory Visit (HOSPITAL_COMMUNITY)
Admission: RE | Admit: 2012-11-03 | Discharge: 2012-11-03 | Disposition: A | Discharge status: Home / Self Care | Payer: Medicare Other | Source: Ambulatory Visit | Attending: Family Medicine | Admitting: Family Medicine

## 2012-11-03 ENCOUNTER — Encounter: Payer: Self-pay | Admitting: Family Medicine

## 2012-11-03 ENCOUNTER — Ambulatory Visit (INDEPENDENT_AMBULATORY_CARE_PROVIDER_SITE_OTHER): Payer: Medicare Other | Admitting: Family Medicine

## 2012-11-03 VITALS — BP 138/80 | HR 63 | Resp 18 | Ht 66.0 in | Wt 306.1 lb

## 2012-11-03 DIAGNOSIS — Z01419 Encounter for gynecological examination (general) (routine) without abnormal findings: Secondary | ICD-10-CM | POA: Insufficient documentation

## 2012-11-03 DIAGNOSIS — Z1211 Encounter for screening for malignant neoplasm of colon: Secondary | ICD-10-CM

## 2012-11-03 DIAGNOSIS — E119 Type 2 diabetes mellitus without complications: Secondary | ICD-10-CM

## 2012-11-03 DIAGNOSIS — I10 Essential (primary) hypertension: Secondary | ICD-10-CM

## 2012-11-03 LAB — POC HEMOCCULT BLD/STL (OFFICE/1-CARD/DIAGNOSTIC): Fecal Occult Blood, POC: NEGATIVE

## 2012-11-03 MED ORDER — SIMVASTATIN 40 MG PO TABS: 40.0000 mg | ORAL_TABLET | Freq: Every day | ORAL | Status: DC

## 2012-11-03 MED ORDER — OMEPRAZOLE 40 MG PO CPDR: 40.0000 mg | DELAYED_RELEASE_CAPSULE | Freq: Every day | ORAL | Status: DC

## 2012-11-03 MED ORDER — GABAPENTIN 300 MG PO CAPS: ORAL_CAPSULE | ORAL | Status: DC

## 2012-11-03 MED ORDER — NAPROXEN 500 MG PO TABS: ORAL_TABLET | ORAL | Status: DC

## 2012-11-03 MED ORDER — FLUOXETINE HCL 40 MG PO CAPS: 40.0000 mg | ORAL_CAPSULE | Freq: Every day | ORAL | Status: DC

## 2012-11-03 MED ORDER — FUROSEMIDE 20 MG PO TABS: 20.0000 mg | ORAL_TABLET | Freq: Every day | ORAL | Status: DC

## 2012-11-03 NOTE — Patient Instructions (Addendum)
I recommend eye visit once a year I recommend dental visit every 6 months Goal is to  Exercise 30 minutes 5 days a week We will send a letter with PAP results  Continue to work on exercise and weightloss Labs before next visit Colonoscopy to be done - referral  F/U 3 months

## 2012-11-03 NOTE — Addendum Note (Signed)
Addended by: Kandis Fantasia B on: 11/03/2012 09:47 AM   Modules accepted: Orders

## 2012-11-03 NOTE — Assessment & Plan Note (Addendum)
Mammogram UTD PAP Smear completed today Referral for colonoscopy

## 2012-11-03 NOTE — Progress Notes (Signed)
  Subjective:    Patient ID: Autumn Carson, female    DOB: Mar 16, 1956, 57 y.o.   MRN: [id number]  HPI  Pt here for GYN exam, no PAP Smear > 3 years Mammogram UTD No specific concerns Medications reviewed   Review of Systems  GEN- denies fatigue, fever, weight loss,weakness, recent illness HEENT- denies eye drainage, change in vision, nasal discharge, CVS- denies chest pain, palpitations RESP- denies SOB, cough, wheeze ABD- denies N/V, change in stools, abd pain GU- denies dysuria, hematuria, dribbling, incontinence MSK- denies joint pain, muscle aches, injury Neuro- denies headache, dizziness, syncope, seizure activity      Objective:   Physical Exam  GEN- NAD, alert and oriented x3 HEENT- PERRL, EOMI, non injected sclera, pink conjunctiva, MMM, oropharynx clear,Tm clear bialt no effusion Neck- Supple, normal ROM,  CVS- RRR, 1/6 SEM,  RESP-CTAB Breast- normal symmetry, no nipple inversion,no nipple drainage, no nodules or lumps felt Nodes- no axillary nodes ABD-NABS,soft,NT,ND GU- normal external genitalia, vaginal mucosa pink and moist, cervix visualized no growth, no blood form os, minimal thin clear discharge, no CMT, no ovarian masses, uterus normal size- difficult to palpate due to habitus, urethra normal position, no bladder prolapse Rectum- normal tone, no external lesions, 1 skin tag noted, FOBT neg  Skin- multiple skin tags EXT- trace pedal edema      Assessment & Plan:

## 2012-11-08 ENCOUNTER — Other Ambulatory Visit: Payer: Self-pay | Admitting: Family Medicine

## 2012-11-11 ENCOUNTER — Telehealth: Payer: Self-pay

## 2012-11-11 NOTE — Telephone Encounter (Signed)
Pt called to schedule appt for colonoscopy. She said she has seen blood in her stool and is scheduled for OV with LL on 11/26/2012 at 9:00 AM. ( Was referred by Dr. Jeanice Lim).

## 2012-11-13 ENCOUNTER — Ambulatory Visit (HOSPITAL_COMMUNITY): Payer: Self-pay | Admitting: Psychiatry

## 2012-11-26 ENCOUNTER — Ambulatory Visit (INDEPENDENT_AMBULATORY_CARE_PROVIDER_SITE_OTHER): Payer: Medicare Other | Admitting: Gastroenterology

## 2012-11-26 ENCOUNTER — Encounter: Payer: Self-pay | Admitting: Gastroenterology

## 2012-11-26 VITALS — BP 145/63 | HR 62 | Temp 97.4°F | Ht 66.0 in | Wt 302.4 lb

## 2012-11-26 DIAGNOSIS — K219 Gastro-esophageal reflux disease without esophagitis: Secondary | ICD-10-CM

## 2012-11-26 DIAGNOSIS — Z800 Family history of malignant neoplasm of digestive organs: Secondary | ICD-10-CM

## 2012-11-26 DIAGNOSIS — K625 Hemorrhage of anus and rectum: Secondary | ICD-10-CM

## 2012-11-26 DIAGNOSIS — D649 Anemia, unspecified: Secondary | ICD-10-CM

## 2012-11-26 DIAGNOSIS — K5900 Constipation, unspecified: Secondary | ICD-10-CM | POA: Insufficient documentation

## 2012-11-26 MED ORDER — PEG 3350-KCL-NA BICARB-NACL 420 G PO SOLR: 4000.0000 mL | ORAL | Status: DC

## 2012-11-26 MED ORDER — POLYETHYLENE GLYCOL 3350 17 GM/SCOOP PO POWD: 17.0000 g | Freq: Every day | ORAL | Status: DC | PRN

## 2012-11-26 NOTE — Assessment & Plan Note (Addendum)
57 year old [place] American female who presents today for consideration of colonoscopy. She has chronic constipation with intermittent bright red blood per rectum. Brother diagnosed with colon cancer at age 64. Patient has never had a colonoscopy. She may have benign anorectal bleeding due to straining and constipation from hemorrhoids or fissure. However we need to exclude colon polyps and malignancy. Colonoscopy in the near future with Dr. Darrick Penna.  I have discussed the risks, alternatives, benefits with regards to but not limited to the risk of reaction to medication, bleeding, infection, perforation and the patient is agreeable to proceed. Written consent to be obtained.  Patient also has a long history of microcytic anemia. Reports that multiple females in her family have had the same. Patient's last menstrual cycle was over 2 years ago. Recently she was Hemoccult negative.

## 2012-11-26 NOTE — Assessment & Plan Note (Signed)
Chronic GERD with no prior upper endoscopy. She is on omeprazole 20 mg daily he continues to have some heartburn and frequent regurgitation. She also takes frequent Naprosyn and gives a remote history of peptic ulcer disease. Recommend EGD for further evaluation.  I have discussed the risks, alternatives, benefits with regards to but not limited to the risk of reaction to medication, bleeding, infection, perforation and the patient is agreeable to proceed. Written consent to be obtained.

## 2012-11-26 NOTE — Progress Notes (Signed)
Forwarding to PCP.

## 2012-11-26 NOTE — Progress Notes (Signed)
Primary Care Physician:  Milinda Antis, MD  Primary Gastroenterologist:  Jonette Eva, MD   Chief Complaint  Patient presents with  . Colonoscopy    HPI:  Autumn Carson is a 57 y.o. female here for consideration of colonoscopy. She has never had a colonoscopy. Her brother was diagnosed with colon cancer at age 74. Patient has a long history of chronic constipation. BM twice per week. Tries to manage with diet. Occasionally has to use suppositories. BRBPR with constipation. No abdominal pain. No melena. Some regurgitation but no heartburn several days per week. Takes omeprazole one to two times daily. GERD for more than five years. No dysphagia. No unintentional weight loss.  She has chronic microcytic anemia. She reports family history of anemia and multiple females. Patient's last menstrual cycle was 3 years ago. Recently Hemoccult negative.   Current Outpatient Prescriptions  Medication Sig Dispense Refill  . aspirin 81 MG tablet Take 81 mg by mouth daily.        . ferrous sulfate 325 (65 FE) MG EC tablet Take 325 mg by mouth daily with breakfast.      . FLUoxetine (PROZAC) 40 MG capsule Take 1 capsule (40 mg total) by mouth daily.  30 capsule  1  . fluticasone (FLONASE) 50 MCG/ACT nasal spray Place 2 sprays into the nose daily.      . furosemide (LASIX) 20 MG tablet Take 1 tablet (20 mg total) by mouth daily.  30 tablet  6  . gabapentin (NEURONTIN) 300 MG capsule TAKE ONE CAPSULE BY MOUTH TWICE DAILY  60 capsule  6  . HYDROcodone-acetaminophen (NORCO/VICODIN) 5-325 MG per tablet Take 1 tablet by mouth every 6 (six) hours as needed for pain.  20 tablet  0  . levothyroxine (SYNTHROID, LEVOTHROID) 200 MCG tablet Take 200 mcg by mouth daily.      Marland Kitchen lisinopril (PRINIVIL,ZESTRIL) 40 MG tablet Take 1 tablet (40 mg total) by mouth daily.  30 tablet  1  . lisinopril (PRINIVIL,ZESTRIL) 40 MG tablet TAKE 1 TABLET BY MOUTH DAILY  30 tablet  3  . metFORMIN (GLUCOPHAGE) 1000 MG tablet TAKE  1 TABLET BY MOUTH TWICE DAILY EVERY MORNING AND EVERY EVENING  60 tablet  3  . naproxen (NAPROSYN) 500 MG tablet TAKE 1 TABLET BY MOUTH TWICE DAILY WITH A MEAL  60 tablet  2  . omeprazole (PRILOSEC) 40 MG capsule Take 1 capsule (40 mg total) by mouth daily.  30 capsule  6  . simvastatin (ZOCOR) 40 MG tablet Take 1 tablet (40 mg total) by mouth daily.  30 tablet  6  . zolpidem (AMBIEN) 5 MG tablet Take 5 mg by mouth at bedtime as needed for sleep.      . polyethylene glycol powder (GLYCOLAX/MIRALAX) powder Take 17 g by mouth daily as needed.  527 g  3   No current facility-administered medications for this visit.    Allergies as of 11/26/2012  . (No Known Allergies)    Past Medical History  Diagnosis Date  . Neuropathy     due to diabetes  . Ulcer     on xray, remotely  . Diabetes mellitus     II  . Hypertension   . Asthma   . Obesity   . Cancer 2011    Papillary Thyroid cancer/Iodine Radiation treatments  . Anemia     chronic    Past Surgical History  Procedure Laterality Date  . Cesarean section    . Tubal ligation    .  Tonsilectomy, adenoidectomy, bilateral myringotomy and tubes  1987  . Carpal tunnel release      4 total, bilaterally  . Rotator cuff repair      bilaterally  . Total thyroidectomy  2011  . Elbow surgery      bilaterally    Family History  Problem Relation Age of Onset  . Heart disease Mother   . Cancer Father     lung, age 72  . Cancer Brother     colon, age 26  . Heart disease Daughter 32    Heart Attack   . Hypertension Daughter   . Other Brother     agent orange, age 52, cancer    History   Social History  . Marital Status: Married    Spouse Name: N/A    Number of Children: 2  . Years of Education: N/A   Occupational History  . disabled    Social History Main Topics  . Smoking status: Never Smoker   . Smokeless tobacco: Never Used  . Alcohol Use: No  . Drug Use: No  . Sexually Active: Not Currently    Birth Control/  Protection: Surgical   Other Topics Concern  . Not on file   Social History Narrative  . No narrative on file      ROS:  General: Negative for anorexia, weight loss, fever, chills, fatigue, weakness. Eyes: Negative for vision changes.  ENT: Negative for hoarseness, difficulty swallowing , nasal congestion. CV: Negative for chest pain, angina, palpitations, dyspnea on exertion, peripheral edema.  Respiratory: Negative for dyspnea at rest, dyspnea on exertion, cough, sputum, wheezing.  GI: See history of present illness. GU:  Negative for dysuria, hematuria, urinary incontinence, urinary frequency, nocturnal urination.  MS: Negative for joint pain, low back pain.  Derm: Negative for rash or itching.  Neuro: Negative for weakness, abnormal sensation, seizure, frequent headaches, memory loss, confusion.  Psych: Negative for anxiety, depression, suicidal ideation, hallucinations.  Endo: Negative for unusual weight change.  Heme: Negative for bruising or bleeding. Allergy: Negative for rash or hives.    Physical Examination:  BP 145/63  Pulse 62  Temp(Src) 97.4 F (36.3 C) (Oral)  Ht 5\' 6"  (1.676 m)  Wt 302 lb 6.4 oz (137.168 kg)  BMI 48.83 kg/m2   General: Well-nourished, well-developed in no acute distress.  Head: Normocephalic, atraumatic.   Eyes: Conjunctiva pink, no icterus. Mouth: Oropharyngeal mucosa moist and pink , no lesions erythema or exudate. Neck: Supple without thyromegaly, masses, or lymphadenopathy.  Lungs: Clear to auscultation bilaterally.  Heart: Regular rate and rhythm, no murmurs rubs or gallops.  Abdomen: Bowel sounds are normal, nontender, nondistended, no hepatosplenomegaly or masses, no abdominal bruits or    hernia , no rebound or guarding.   Rectal: deferred Extremities: No lower extremity edema. No clubbing or deformities.  Neuro: Alert and oriented x 4 , grossly normal neurologically.  Skin: Warm and dry, no rash or jaundice.   Psych: Alert  and cooperative, normal mood and affect.  Labs: Lab Results  Component Value Date   CREATININE 1.02 06/12/2012   BUN 12 06/12/2012   NA 138 06/12/2012   K 3.7 06/12/2012   CL 100 06/12/2012   CO2 29 06/12/2012   Lab Results  Component Value Date   ALT 11 06/12/2012   AST 12 06/12/2012   ALKPHOS 60 06/12/2012   BILITOT 0.3 06/12/2012   Lab Results  Component Value Date   WBC 5.4 06/12/2012   HGB 10.5* 06/12/2012  HCT 32.8* 06/12/2012   MCV 77.9* 06/12/2012   PLT 304 06/12/2012      Imaging Studies: No results found.

## 2012-11-26 NOTE — Patient Instructions (Addendum)
We have scheduled you for an upper endoscopy and colonoscopy with Dr. Darrick Penna. Please see separate instructions. Please take MiraLax 1 capful twice a day for 3 days, then daily. Began one week before your procedure. You may also take this chronically as needed for constipation.

## 2012-11-26 NOTE — Assessment & Plan Note (Signed)
Add MiraLax 17 g daily as needed for chronic constipation. Prior to her bowel prep she will begin 17 g twice a day for 3 days followed by once daily until her bowel prep.

## 2012-11-28 ENCOUNTER — Encounter (HOSPITAL_COMMUNITY): Payer: Self-pay | Admitting: Pharmacy Technician

## 2012-12-08 ENCOUNTER — Encounter (HOSPITAL_COMMUNITY)
Admission: RE | Disposition: A | Discharge status: Home / Self Care | Payer: Self-pay | Source: Ambulatory Visit | Attending: Gastroenterology

## 2012-12-08 ENCOUNTER — Ambulatory Visit (HOSPITAL_COMMUNITY)
Admission: RE | Admit: 2012-12-08 | Discharge: 2012-12-08 | Disposition: A | Discharge status: Home / Self Care | Payer: Medicare Other | Source: Ambulatory Visit | Attending: Gastroenterology | Admitting: Gastroenterology

## 2012-12-08 ENCOUNTER — Encounter (HOSPITAL_COMMUNITY): Payer: Self-pay

## 2012-12-08 DIAGNOSIS — Z6841 Body Mass Index (BMI) 40.0 and over, adult: Secondary | ICD-10-CM | POA: Insufficient documentation

## 2012-12-08 DIAGNOSIS — R109 Unspecified abdominal pain: Secondary | ICD-10-CM

## 2012-12-08 DIAGNOSIS — D649 Anemia, unspecified: Secondary | ICD-10-CM

## 2012-12-08 DIAGNOSIS — K5730 Diverticulosis of large intestine without perforation or abscess without bleeding: Secondary | ICD-10-CM | POA: Insufficient documentation

## 2012-12-08 DIAGNOSIS — Z01812 Encounter for preprocedural laboratory examination: Secondary | ICD-10-CM | POA: Insufficient documentation

## 2012-12-08 DIAGNOSIS — K625 Hemorrhage of anus and rectum: Secondary | ICD-10-CM | POA: Insufficient documentation

## 2012-12-08 DIAGNOSIS — K648 Other hemorrhoids: Secondary | ICD-10-CM | POA: Insufficient documentation

## 2012-12-08 DIAGNOSIS — K3189 Other diseases of stomach and duodenum: Secondary | ICD-10-CM | POA: Insufficient documentation

## 2012-12-08 DIAGNOSIS — Z800 Family history of malignant neoplasm of digestive organs: Secondary | ICD-10-CM | POA: Insufficient documentation

## 2012-12-08 DIAGNOSIS — I10 Essential (primary) hypertension: Secondary | ICD-10-CM | POA: Insufficient documentation

## 2012-12-08 DIAGNOSIS — D131 Benign neoplasm of stomach: Secondary | ICD-10-CM | POA: Insufficient documentation

## 2012-12-08 DIAGNOSIS — K219 Gastro-esophageal reflux disease without esophagitis: Secondary | ICD-10-CM

## 2012-12-08 DIAGNOSIS — K5900 Constipation, unspecified: Secondary | ICD-10-CM

## 2012-12-08 HISTORY — DX: Anxiety disorder, unspecified: F41.9

## 2012-12-08 HISTORY — DX: Gastro-esophageal reflux disease without esophagitis: K21.9

## 2012-12-08 HISTORY — DX: Hypothyroidism, unspecified: E03.9

## 2012-12-08 HISTORY — PX: COLONOSCOPY WITH ESOPHAGOGASTRODUODENOSCOPY (EGD): SHX5779

## 2012-12-08 LAB — GLUCOSE, CAPILLARY: Glucose-Capillary: 92 mg/dL (ref 70–99)

## 2012-12-08 SURGERY — COLONOSCOPY WITH ESOPHAGOGASTRODUODENOSCOPY (EGD)
Anesthesia: Moderate Sedation

## 2012-12-08 MED ORDER — PROMETHAZINE HCL 25 MG/ML IJ SOLN
INTRAMUSCULAR | Status: DC | PRN
  Administered 2012-12-08: 12.5 mg via INTRAVENOUS

## 2012-12-08 MED ORDER — SODIUM CHLORIDE 0.9 % IJ SOLN
INTRAMUSCULAR | Status: AC
  Filled 2012-12-08: qty 10

## 2012-12-08 MED ORDER — SODIUM CHLORIDE 0.9 % IV SOLN
INTRAVENOUS | Status: DC
  Administered 2012-12-08: 09:00:00 via INTRAVENOUS

## 2012-12-08 MED ORDER — MIDAZOLAM HCL 5 MG/5ML IJ SOLN
INTRAMUSCULAR | Status: DC | PRN
  Administered 2012-12-08: 1 mg via INTRAVENOUS
  Administered 2012-12-08: 2 mg via INTRAVENOUS
  Administered 2012-12-08: 1 mg via INTRAVENOUS

## 2012-12-08 MED ORDER — MEPERIDINE HCL 100 MG/ML IJ SOLN
INTRAMUSCULAR | Status: AC
  Filled 2012-12-08: qty 1

## 2012-12-08 MED ORDER — MIDAZOLAM HCL 5 MG/5ML IJ SOLN
INTRAMUSCULAR | Status: AC
  Filled 2012-12-08: qty 10

## 2012-12-08 MED ORDER — PROMETHAZINE HCL 25 MG/ML IJ SOLN
INTRAMUSCULAR | Status: AC
  Filled 2012-12-08: qty 1

## 2012-12-08 MED ORDER — MEPERIDINE HCL 100 MG/ML IJ SOLN
INTRAMUSCULAR | Status: DC | PRN
  Administered 2012-12-08: 25 mg via INTRAVENOUS
  Administered 2012-12-08: 50 mg via INTRAVENOUS
  Administered 2012-12-08: 25 mg via INTRAVENOUS

## 2012-12-08 MED ORDER — SIMETHICONE 40 MG/0.6ML PO SUSP
ORAL | Status: DC | PRN
  Administered 2012-12-08: 10:00:00

## 2012-12-08 MED ORDER — BUTAMBEN-TETRACAINE-BENZOCAINE 2-2-14 % EX AERO
INHALATION_SPRAY | CUTANEOUS | Status: DC | PRN
  Administered 2012-12-08: 1 via TOPICAL

## 2012-12-08 NOTE — H&P (Signed)
Primary Care Physician:  Milinda Antis, MD Primary Gastroenterologist:  Dr. Darrick Penna  Pre-Procedure History & Physical: HPI:  Autumn Carson is a 57 y.o. female here for BRBPR/ABDOMINAL PAIN.   Past Medical History  Diagnosis Date  . Neuropathy     due to diabetes  . Ulcer     on xray, remotely  . Diabetes mellitus     II  . Hypertension   . Asthma   . Obesity   . Cancer 2011    Papillary Thyroid cancer/Iodine Radiation treatments  . Anemia     chronic  . Anxiety   . Hypothyroidism   . GERD (gastroesophageal reflux disease)     Past Surgical History  Procedure Laterality Date  . Cesarean section    . Tubal ligation    . Tonsilectomy, adenoidectomy, bilateral myringotomy and tubes  1987  . Carpal tunnel release      4 total, bilaterally  . Rotator cuff repair      bilaterally  . Total thyroidectomy  2011  . Elbow surgery      bilaterally    Prior to Admission medications   Medication Sig Start Date End Date Taking? Authorizing Provider  aspirin 81 MG tablet Take 81 mg by mouth daily.     Yes Historical Provider, MD  ferrous sulfate 325 (65 FE) MG EC tablet Take 325 mg by mouth daily with breakfast.   Yes Historical Provider, MD  FLUoxetine (PROZAC) 40 MG capsule Take 1 capsule (40 mg total) by mouth daily. 11/03/12  Yes Salley Scarlet, MD  fluticasone [facility]) 50 MCG/ACT nasal spray Place 2 sprays into the nose daily.   Yes Historical Provider, MD  furosemide (LASIX) 20 MG tablet Take 1 tablet (20 mg total) by mouth daily. 11/03/12 11/03/13 Yes Salley Scarlet, MD  gabapentin (NEURONTIN) 300 MG capsule Take 300 mg by mouth 2 (two) times daily. 11/03/12  Yes Salley Scarlet, MD  HYDROcodone-acetaminophen (NORCO/VICODIN) 5-325 MG per tablet Take 1 tablet by mouth every 6 (six) hours as needed for pain. 09/15/12  Yes Salley Scarlet, MD  levothyroxine (SYNTHROID, LEVOTHROID) 200 MCG tablet Take 200 mcg by mouth daily.   Yes Historical Provider, MD  lisinopril  (PRINIVIL,ZESTRIL) 40 MG tablet Take 40 mg by mouth daily. 09/10/12  Yes Salley Scarlet, MD  metFORMIN (GLUCOPHAGE) 1000 MG tablet Take 1,000 mg by mouth 2 (two) times daily with a meal.   Yes Historical Provider, MD  naproxen (NAPROSYN) 500 MG tablet Take 500 mg by mouth 2 (two) times daily with a meal. 11/03/12  Yes Salley Scarlet, MD  omeprazole (PRILOSEC) 40 MG capsule Take 1 capsule (40 mg total) by mouth daily. 11/03/12  Yes Salley Scarlet, MD  polyethylene glycol-electrolytes (TRILYTE) 420 G solution Take 4,000 mLs by mouth as directed. 11/26/12  Yes West Bali, MD  simvastatin (ZOCOR) 40 MG tablet Take 1 tablet (40 mg total) by mouth daily. 11/03/12  Yes Salley Scarlet, MD  zolpidem (AMBIEN) 5 MG tablet Take 5 mg by mouth at bedtime as needed for sleep.   Yes Historical Provider, MD  albuterol (PROVENTIL HFA;VENTOLIN HFA) 108 (90 BASE) MCG/ACT inhaler Inhale 2 puffs into the lungs every 6 (six) hours as needed for wheezing.    Historical Provider, MD    Allergies as of 11/26/2012  . (Not on File)    Family History  Problem Relation Age of Onset  . Heart disease Mother   . Cancer Father  lung, age 45  . Cancer Brother     colon, age 66  . Heart disease Daughter 32    Heart Attack   . Hypertension Daughter   . Other Brother     agent orange, age 48, cancer    History   Social History  . Marital Status: Married    Spouse Name: N/A    Number of Children: 2  . Years of Education: N/A   Occupational History  . disabled    Social History Main Topics  . Smoking status: Never Smoker   . Smokeless tobacco: Never Used  . Alcohol Use: No     Comment: occassional  . Drug Use: No  . Sexually Active: Not Currently    Birth Control/ Protection: Surgical   Other Topics Concern  . Not on file   Social History Narrative  . No narrative on file    Review of Systems: See HPI, otherwise negative ROS   Physical Exam: BP 152/94  Pulse 48  Temp(Src) 98 F (36.7 C)  (Oral)  Resp 13  Ht 5\' 6"  (1.676 m)  Wt 302 lb (136.986 kg)  BMI 48.77 kg/m2  SpO2 99% General:   Alert,  pleasant and cooperative in NAD Head:  Normocephalic and atraumatic. Neck:  Supple; Lungs:  Clear throughout to auscultation.    Heart:  Regular rate and rhythm. Abdomen:  Soft, nontender and nondistended. Normal bowel sounds, without guarding, and without rebound.   Neurologic:  Alert and  oriented x4;  grossly normal neurologically.  Impression/Plan:     BRBPR/ABDOMINAL PAIN  PLAN: EGD/TCS TODAY

## 2012-12-08 NOTE — Op Note (Signed)
[facility] [address] [place], [address]   ENDOSCOPY PROCEDURE REPORT  PATIENT: Autumn Carson, Autumn Carson  MR#: [id number] BIRTHDATE: 26-Mar-1956 , 56  yrs. old GENDER: Female  ENDOSCOPIST: Jonette Eva, MD REFERRED XB:JYNWGNF [place], M.D. PROCEDURE DATE: 12/08/2012 PROCEDURE:   EGD w/ biopsy  INDICATIONS:Dyspepsia.   Anemia-HB 10.1-10.9, MCV 77.9-82.1 SINCE 2011. 2014: HEME NEG. NO MENSES FOR 3 YEARS. lost 80 lbs intentionally SINCE 2012.  MEDICATIONS: TCS+ Demerol 25 mg IV and Versed 1mg  IV TOPICAL ANESTHETIC:   Cetacaine Spray  DESCRIPTION OF PROCEDURE:     Physical exam was performed.  Informed consent was obtained from the patient after explaining the benefits, risks, and alternatives to the procedure.  The patient was connected to the monitor and placed in the left lateral position.  Continuous oxygen was provided by nasal cannula and IV medicine administered through an indwelling cannula.  After administration of sedation, the patients esophagus was intubated and the EG-[id number] ([id number])  endoscope was advanced under direct visualization to the second portion of the duodenum.  The scope was removed slowly by carefully examining the color, texture, anatomy, and integrity of the mucosa on the way out.  The patient was recovered in endoscopy and discharged home in satisfactory condition.   ESOPHAGUS: The mucosa of the esophagus appeared normal.   STOMACH: Multiple sessile polyps ranging between 3-71mm in size were found in the gastric body.  Multiple biopsies was performed using cold forceps.   DUODENUM: The duodenal mucosa showed no abnormalities in the bulb and second portion of the duodenum.  COMPLICATIONS:   None  ENDOSCOPIC IMPRESSION: 1.   Multiple sessile GASTRIC polyps 2.   NO SOURCE FOR ANEMIA IDENTIFIED  RECOMMENDATIONS: CONTINUE WEIGHT LOSS EFFORTS. CONTINUE OMEPRAZOLE.  TAKE 30 MINUTES PRIOR TO YOUR FIRST AND LAST MEAL. AVOID ITEMS  THAT TRIGGER GASTRITIS. FOLLOW A DIABETIC/HIGH FIBER/LOW FAT DIET.  AVOID ITEMS THAT CAUSE BLOATING. BIOPSY RESULTS WILL BE AVAILABLE IN 7 DAYS. CONSIDER CAPSULE ENDOSCOPY FOR OBSCURE GIB/ANEMIA. OPV IN 4 MOS.   REPEAT EXAM:   _______________________________ Rosalie DoctorJonette Eva, MD 12/08/2012 2:03 PM       PATIENT NAME:  Autumn Carson, Autumn Carson MR#: [id number]

## 2012-12-08 NOTE — Op Note (Signed)
[facility] [address] [place], [address]   COLONOSCOPY PROCEDURE REPORT  PATIENT: Autumn Carson, Autumn Carson  MR#: [id number] BIRTHDATE: [birth year] , 56  yrs. old GENDER: Female ENDOSCOPIST: Jonette Eva, MD REFERRED XB:JYNWGNF [place], M.D. PROCEDURE DATE:  12/08/2012 PROCEDURE:   Colonoscopy, diagnostic INDICATIONS:Rectal Bleeding, Constipation, and abdominal pain. ANEMIA-Hb 10.1-10.8, MCV 77.9-82.1 since 2011,-Last Ferritin 49 in 2011. LOST 80 LBS OVER THE PAST 1.5 YEARS-BMI 48. MOTHER HAD COLON CA. MEDICATIONS: Demerol, Versed, and Promethazine (Phenergan) 12.5mg  IV   DESCRIPTION OF PROCEDURE:    Physical exam was performed.  Informed consent was obtained from the patient after explaining the benefits, risks, and alternatives to procedure.  The patient was connected to monitor and placed in left lateral position. Continuous oxygen was provided by nasal cannula and IV medicine administered through an indwelling cannula.  After administration of sedation and rectal exam, the patients rectum was intubated and the EC-3890Li ([id number]) and EG-2990i ([id number])  colonoscope was advanced under direct visualization to the ileum.  The scope was removed slowly by carefully examining the color, texture, anatomy, and integrity mucosa on the way out.  The patient was recovered in endoscopy and discharged home in satisfactory condition.    COLON FINDINGS: The mucosa appeared normal in the terminal ileum.  , Mild diverticulosis was noted throughout the entire examined colon. , The colon was otherwise normal.  There was inflammation, polyps or cancers unless previously stated.  , and Moderate sized internal hemorrhoids were found.  PREP QUALITY: good.  CECAL W/D TIME: 15 minutes     COMPLICATIONS: None  ENDOSCOPIC IMPRESSION: 1.   Normal mucosa in the terminal ileum 2.   Mild diverticulosis throughout the entire examined colon 3.   RECTAL BLEEDING DUE TO Moderate  sized internal hemorrhoids   RECOMMENDATIONS: CONTINUE YOUR WEIGHT LOSS EFFORTS. FOLLOW A DIABETIC/HIGH FIBER/LOW FAT DIET.  AVOID ITEMS THAT CAUSE BLOATING. BIOPSY RESULTS WILL BE AVAILABLE IN 7 DAYS. Next colonoscopy in 10 years BECAUSE HER MOTHER HAD COLON CANCER AFTER THE AGE OF 60.       _______________________________ eSignedJonette Eva, MD 12/08/2012 1:39 PM     PATIENT NAME:  Autumn Carson MR#: [id number]

## 2012-12-10 ENCOUNTER — Encounter (HOSPITAL_COMMUNITY): Payer: Self-pay | Admitting: Gastroenterology

## 2012-12-14 ENCOUNTER — Encounter (HOSPITAL_COMMUNITY): Payer: Self-pay | Admitting: *Deleted

## 2012-12-14 ENCOUNTER — Emergency Department (HOSPITAL_COMMUNITY)
Admission: EM | Admit: 2012-12-14 | Discharge: 2012-12-14 | Disposition: A | Discharge status: Home / Self Care | Payer: Medicare Other | Attending: Emergency Medicine | Admitting: Emergency Medicine

## 2012-12-14 ENCOUNTER — Emergency Department (HOSPITAL_COMMUNITY): Payer: Medicare Other

## 2012-12-14 DIAGNOSIS — I10 Essential (primary) hypertension: Secondary | ICD-10-CM | POA: Insufficient documentation

## 2012-12-14 DIAGNOSIS — IMO0002 Reserved for concepts with insufficient information to code with codable children: Secondary | ICD-10-CM | POA: Insufficient documentation

## 2012-12-14 DIAGNOSIS — Z8669 Personal history of other diseases of the nervous system and sense organs: Secondary | ICD-10-CM | POA: Insufficient documentation

## 2012-12-14 DIAGNOSIS — Z85850 Personal history of malignant neoplasm of thyroid: Secondary | ICD-10-CM | POA: Insufficient documentation

## 2012-12-14 DIAGNOSIS — E119 Type 2 diabetes mellitus without complications: Secondary | ICD-10-CM | POA: Insufficient documentation

## 2012-12-14 DIAGNOSIS — E669 Obesity, unspecified: Secondary | ICD-10-CM | POA: Insufficient documentation

## 2012-12-14 DIAGNOSIS — J45909 Unspecified asthma, uncomplicated: Secondary | ICD-10-CM | POA: Insufficient documentation

## 2012-12-14 DIAGNOSIS — Z7982 Long term (current) use of aspirin: Secondary | ICD-10-CM | POA: Insufficient documentation

## 2012-12-14 DIAGNOSIS — D649 Anemia, unspecified: Secondary | ICD-10-CM | POA: Insufficient documentation

## 2012-12-14 DIAGNOSIS — E039 Hypothyroidism, unspecified: Secondary | ICD-10-CM | POA: Insufficient documentation

## 2012-12-14 DIAGNOSIS — Z791 Long term (current) use of non-steroidal anti-inflammatories (NSAID): Secondary | ICD-10-CM | POA: Insufficient documentation

## 2012-12-14 DIAGNOSIS — Z79899 Other long term (current) drug therapy: Secondary | ICD-10-CM | POA: Insufficient documentation

## 2012-12-14 DIAGNOSIS — M5417 Radiculopathy, lumbosacral region: Secondary | ICD-10-CM

## 2012-12-14 DIAGNOSIS — Z872 Personal history of diseases of the skin and subcutaneous tissue: Secondary | ICD-10-CM | POA: Insufficient documentation

## 2012-12-14 DIAGNOSIS — F411 Generalized anxiety disorder: Secondary | ICD-10-CM | POA: Insufficient documentation

## 2012-12-14 DIAGNOSIS — K219 Gastro-esophageal reflux disease without esophagitis: Secondary | ICD-10-CM | POA: Insufficient documentation

## 2012-12-14 HISTORY — DX: Dorsalgia, unspecified: M54.9

## 2012-12-14 MED ORDER — OXYCODONE-ACETAMINOPHEN 5-325 MG PO TABS: 1.0000 | ORAL_TABLET | ORAL | Status: DC | PRN

## 2012-12-14 MED ORDER — OXYCODONE-ACETAMINOPHEN 5-325 MG PO TABS
2.0000 | ORAL_TABLET | Freq: Once | ORAL | Status: AC
  Administered 2012-12-14: 2 via ORAL
  Filled 2012-12-14: qty 2

## 2012-12-14 NOTE — ED Notes (Signed)
Pt has hx of back problems, was helping move stuff out of her aunt's house week and half ago, the next morning pt states that she started having pain on right lower back area, at times it will radiate down her right leg. Pain is worse in am and with movement, denies any urinary symptoms.

## 2012-12-14 NOTE — ED Provider Notes (Signed)
History  This chart was scribed for Joya Gaskins, MD by Bennett Scrape, ED Scribe. This patient was seen in room APA03/APA03 and the patient's care was started at 7:02 AM.  CSN: [id number]  Arrival date & time 12/14/12  0656   First MD Initiated Contact with Patient 12/14/12 [phone]      Chief Complaint  Patient presents with  . Back Pain     Patient is a 57 y.o. female presenting with back pain. The history is provided by the patient. No language interpreter was used.  Back Pain Location:  Lumbar spine Quality:  Cramping Radiates to:  R foot Pain severity:  Moderate Onset quality:  Sudden Duration:  2 weeks Timing:  Constant Progression:  Waxing and waning Context: lifting heavy objects   Relieved by:  Nothing Worsened by:  Movement and ambulation Ineffective treatments:  None tried Associated symptoms: no abdominal pain, no bladder incontinence, no bowel incontinence, no dysuria, no fever, no numbness and no weakness     HPI Comments: Autumn Carson is a 57 y.o. female with a h/o bulging discs but no prior back surgery who presents to the Emergency Department complaining of 2 weeks of sudden onset, waxing and waning lower back pain described as a cramp that radiates down the entire right leg that started the day after moving large boxes. She reports that 2 weeks ago she was moving boxes and heard a "pop" but didn't feel the pain until the next morning. She states that since then the symptoms have been persistent. She reports that she has difficulty ambulating due to the severity at time but denies any recent falls.  She states that she has tried consuming alcohol to dull the pain . She has prescriptions for neurontin and norco at home but states that she ran out and denies taking any medications at home to improve her symptoms. She admits that she has not f/u with her PCP for the symptoms stating that "She would have told me to come here any way". She denies fevers,  emesis, abdominal pain, weakness, numbness and bowel or urinary incontinence as associated symptoms. She also has a h/o DM and HTN.   PCP is Dr. Jeanice Lim  Past Medical History  Diagnosis Date  . Neuropathy     due to diabetes  . Ulcer     on xray, remotely  . Diabetes mellitus     II  . Hypertension   . Asthma   . Obesity   . Cancer 2011    Papillary Thyroid cancer/Iodine Radiation treatments  . Anemia     chronic  . Anxiety   . Hypothyroidism   . GERD (gastroesophageal reflux disease)   . Back pain     Past Surgical History  Procedure Laterality Date  . Cesarean section    . Tubal ligation    . Tonsilectomy, adenoidectomy, bilateral myringotomy and tubes  1987  . Carpal tunnel release      4 total, bilaterally  . Rotator cuff repair      bilaterally  . Total thyroidectomy  2011  . Elbow surgery      bilaterally  . Colonoscopy with esophagogastroduodenoscopy (egd) N/A 12/08/2012    Procedure: COLONOSCOPY WITH ESOPHAGOGASTRODUODENOSCOPY (EGD);  Surgeon: West Bali, MD;  Location: [facility];  Service: Endoscopy;  Laterality: N/A;  9:45    Family History  Problem Relation Age of Onset  . Heart disease Mother   . Cancer Father     lung,  age 60  . Cancer Brother     colon, age 30  . Heart disease Daughter 32    Heart Attack   . Hypertension Daughter   . Other Brother     agent orange, age 36, cancer    History  Substance Use Topics  . Smoking status: Never Smoker   . Smokeless tobacco: Never Used  . Alcohol Use: No     Comment: occassional    No OB history provided.  Review of Systems  Constitutional: Negative for fever.  Gastrointestinal: Negative for abdominal pain and bowel incontinence.  Genitourinary: Negative for bladder incontinence and dysuria.  Musculoskeletal: Positive for back pain.  Neurological: Negative for weakness and numbness.  All other systems reviewed and are negative.    Allergies  Grapefruit oil  Home Medications    Current Outpatient Rx  Name  Route  Sig  Dispense  Refill  . albuterol (PROVENTIL HFA;VENTOLIN HFA) 108 (90 BASE) MCG/ACT inhaler   Inhalation   Inhale 2 puffs into the lungs every 6 (six) hours as needed for wheezing.         Marland Kitchen aspirin 81 MG tablet   Oral   Take 81 mg by mouth daily.           . ferrous sulfate 325 (65 FE) MG EC tablet   Oral   Take 325 mg by mouth daily with breakfast.         . FLUoxetine (PROZAC) 40 MG capsule   Oral   Take 1 capsule (40 mg total) by mouth daily.   30 capsule   1   . fluticasone (FLONASE) 50 MCG/ACT nasal spray   Nasal   Place 2 sprays into the nose daily.         . furosemide (LASIX) 20 MG tablet   Oral   Take 1 tablet (20 mg total) by mouth daily.   30 tablet   6   . gabapentin (NEURONTIN) 300 MG capsule   Oral   Take 300 mg by mouth 2 (two) times daily.         Marland Kitchen HYDROcodone-acetaminophen (NORCO/VICODIN) 5-325 MG per tablet   Oral   Take 1 tablet by mouth every 6 (six) hours as needed for pain.   20 tablet   0   . levothyroxine (SYNTHROID, LEVOTHROID) 200 MCG tablet   Oral   Take 200 mcg by mouth daily.         Marland Kitchen lisinopril (PRINIVIL,ZESTRIL) 40 MG tablet   Oral   Take 40 mg by mouth daily.         . metFORMIN (GLUCOPHAGE) 1000 MG tablet   Oral   Take 1,000 mg by mouth 2 (two) times daily with a meal.         . naproxen (NAPROSYN) 500 MG tablet   Oral   Take 500 mg by mouth 2 (two) times daily with a meal.         . omeprazole (PRILOSEC) 40 MG capsule   Oral   Take 1 capsule (40 mg total) by mouth daily.   30 capsule   6   . simvastatin (ZOCOR) 40 MG tablet   Oral   Take 1 tablet (40 mg total) by mouth daily.   30 tablet   6   . zolpidem (AMBIEN) 5 MG tablet   Oral   Take 5 mg by mouth at bedtime as needed for sleep.           Triage  Vitals: BP 185/82  Pulse 56  Temp(Src) 97.6 F (36.4 C) (Oral)  Resp 20  SpO2 100%  Physical Exam  Nursing note and vitals  reviewed.  CONSTITUTIONAL: Well developed/well nourished HEAD: Normocephalic/atraumatic EYES: EOMI/PERRL ENMT: Mucous membranes moist NECK: supple no meningeal signs SPINE: paraspinal lumbar tenderness CV: S1/S2 noted, no murmurs/rubs/gallops noted LUNGS: Lungs are clear to auscultation bilaterally, no apparent distress ABDOMEN: soft, nontender, no rebound or guarding GU:no cva tenderness NEURO: Awake/alert, equal distal motor: hip flexion/knee flexion/extension, mild weakness with plantar flexion of right foot, great toe extension intact bilaterally, no clonus bilaterally, plantar reflex appropriate, no apparent sensory deficit in any dermatome.  Equal patellar/achilles reflex noted.  Pt is able to ambulate unassisted (watched patient walk into the ED) EXTREMITIES: pulses normal, full ROM SKIN: warm, color normal PSYCH: no abnormalities of mood noted  ED Course  Procedures   Medications  oxyCODONE-acetaminophen (PERCOCET/ROXICET) 5-325 MG per tablet 2 tablet (not administered)    DIAGNOSTIC STUDIES: Oxygen Saturation is 100% on room air, normal by my interpretation.    COORDINATION OF CARE: 7:22 AM-Discussed treatment plan which includes xray of the lumbar spine with pt at bedside and pt agreed to plan. Advised pt that her symptoms may be a pinched nerve and that she should f/u with her PCP to schedule an MRI.   Xray negative No urinary symptoms to suggest uti Other than mild weakness with right foot plantar flexion, no neuro deficits and since she has had this previously, do not feel emergent MRI needed at this time Discussed strict return precautions    MDM  Will send note to Dr. Jeanice Lim for MRI xrays reviewed and considered   I personally performed the services described in this documentation, which was scribed in my presence. The recorded information has been reviewed and is accurate.         Joya Gaskins, MD 12/14/12 [phone]

## 2012-12-15 ENCOUNTER — Ambulatory Visit (INDEPENDENT_AMBULATORY_CARE_PROVIDER_SITE_OTHER): Payer: Medicare Other | Admitting: Family Medicine

## 2012-12-15 ENCOUNTER — Encounter: Payer: Self-pay | Admitting: Family Medicine

## 2012-12-15 VITALS — BP 158/80 | HR 60 | Resp 20 | Ht 66.0 in | Wt 301.0 lb

## 2012-12-15 DIAGNOSIS — M549 Dorsalgia, unspecified: Secondary | ICD-10-CM

## 2012-12-15 DIAGNOSIS — M5136 Other intervertebral disc degeneration, lumbar region: Secondary | ICD-10-CM

## 2012-12-15 DIAGNOSIS — M5137 Other intervertebral disc degeneration, lumbosacral region: Secondary | ICD-10-CM

## 2012-12-15 MED ORDER — OXYCODONE-ACETAMINOPHEN 5-325 MG PO TABS: 1.0000 | ORAL_TABLET | ORAL | Status: DC | PRN

## 2012-12-15 MED ORDER — CYCLOBENZAPRINE HCL 10 MG PO TABS: 10.0000 mg | ORAL_TABLET | Freq: Three times a day (TID) | ORAL | Status: DC | PRN

## 2012-12-15 MED ORDER — DIAZEPAM 5 MG PO TABS: ORAL_TABLET | ORAL | Status: DC

## 2012-12-15 NOTE — Patient Instructions (Addendum)
MRI of back to be done Heating pad for back Get the muscle relaxant  Percocet for severe pain Continue NSAIDS Keep previous f/u appt

## 2012-12-16 ENCOUNTER — Telehealth: Payer: Self-pay | Admitting: Gastroenterology

## 2012-12-16 ENCOUNTER — Other Ambulatory Visit: Payer: Self-pay | Admitting: Gastroenterology

## 2012-12-16 DIAGNOSIS — M549 Dorsalgia, unspecified: Secondary | ICD-10-CM | POA: Insufficient documentation

## 2012-12-16 DIAGNOSIS — M5136 Other intervertebral disc degeneration, lumbar region: Secondary | ICD-10-CM | POA: Insufficient documentation

## 2012-12-16 NOTE — Assessment & Plan Note (Signed)
MRI to be done 

## 2012-12-16 NOTE — Telephone Encounter (Signed)
Cc PCP 

## 2012-12-16 NOTE — Assessment & Plan Note (Addendum)
Acute on chronic back pain with known history of degenerative disc disease and bulging disc. There is some concern with weakness on her exam as well as worsening pain which has been minimally responsive to medications. I will go ahead and set her up for an MRI she will likely need neurosurgery referral she has had epidural injections in the past She has had > 6 weeks of NSAIDS  Percocet given Muscle relaxant Continue gabapentin

## 2012-12-16 NOTE — Telephone Encounter (Signed)
Called and informed pt.  

## 2012-12-16 NOTE — Telephone Encounter (Signed)
Please call pt. HER STOMACH BIOPSIES SHOW BENIGN STOMACH POLYPS.  NO SOURCE FOR HER ANEMIA WAS IDENTIFIED.   SHE NEEDS A CAPSULE ENDOSCOPY SCHEDULED AFTER JUN 1 TO EVALUATE ANEMIA/OBSCURE GI BLEED.  CONTINUEWEIGHT LOSS EFFORTS.  CONTINUE OMEPRAZOLE.  TAKE 30 MINUTES PRIOR TO YOUR FIRST AND LAST MEAL.  AVOID ITEMS THAT TRIGGER GASTRITIS.  FOLLOW A DIABETIC/HIGH FIBER/LOW FAT DIET. AVOID ITEMS THAT CAUSE BLOATING.   Next colonoscopy in 10 years BECAUSE YOUR MOTHER HAD COLON CANCER AFTER THE AGE OF 60.  OPV IN SEP 2014 W/ SLF E15 ANEMIA

## 2012-12-16 NOTE — Telephone Encounter (Signed)
GIVENS SCHEDULED 6/4 AT 7:30

## 2012-12-16 NOTE — Progress Notes (Signed)
  Subjective:    Patient ID: Autumn Carson, female    DOB: [birth year], 57 y.o.   MRN: [id number]  HPI  Patient here with acute on chronic back pain. 2 weeks ago she was moving some boxes which she felt a pop in her back. She went to one of her anti-inflammatories and helps some however later in the evening she continued to have pain on and off as the week progressed she had pain sleeping as well as pain upon awakening and moving around. She was seen in emergency room on May 18 there was concern with her neurological exam is her DTRs are not symmetric. X-ray showed degenerative disc disease in facet arthritis which is known. She was sent to have an MRI scheduled No change in bowel or bladder  Review of Systems   GEN- denies fatigue, fever, weight loss,weakness, recent illness HEENT- denies eye drainage, change in vision, nasal discharge, CVS- denies chest pain, palpitations RESP- denies SOB, cough, wheeze ABD- denies N/V, change in stools, abd pain GU- denies dysuria, hematuria, dribbling, incontinence MSK- +joint pain, +muscle aches, injury       Objective:   Physical Exam  GEN- NAD, alert and oriented x3, looks very uncomfortable  Back- TTP Lumbar spine R>l, + paraspinal tenderness, +SLR right side, antalgic gait  Pain with flexion/extension, able to walk on toes and heels  Neuro- CNII-XII in tact, Motor decreased 4+/5 RLE compared to left ? Due to pain, Patella 2+ bilat, Achilles 2+ no foot drops, sensation grossly in tact  EXT- + trace  edema Pulses- Radial, DP- 2+        Assessment & Plan:

## 2012-12-17 NOTE — Telephone Encounter (Signed)
Reminder in epic °

## 2012-12-23 ENCOUNTER — Encounter: Payer: Self-pay | Admitting: Family Medicine

## 2012-12-25 ENCOUNTER — Encounter (HOSPITAL_COMMUNITY): Payer: Self-pay | Admitting: Pharmacy Technician

## 2012-12-31 ENCOUNTER — Encounter (HOSPITAL_COMMUNITY)
Admission: RE | Disposition: A | Discharge status: Home / Self Care | Payer: Self-pay | Source: Ambulatory Visit | Attending: Gastroenterology

## 2012-12-31 ENCOUNTER — Ambulatory Visit (HOSPITAL_COMMUNITY)
Admission: RE | Admit: 2012-12-31 | Discharge: 2012-12-31 | Disposition: A | Discharge status: Home / Self Care | Payer: Medicare Other | Source: Ambulatory Visit | Attending: Gastroenterology | Admitting: Gastroenterology

## 2012-12-31 DIAGNOSIS — K633 Ulcer of intestine: Secondary | ICD-10-CM

## 2012-12-31 DIAGNOSIS — K921 Melena: Secondary | ICD-10-CM | POA: Insufficient documentation

## 2012-12-31 DIAGNOSIS — Z01812 Encounter for preprocedural laboratory examination: Secondary | ICD-10-CM | POA: Insufficient documentation

## 2012-12-31 DIAGNOSIS — K922 Gastrointestinal hemorrhage, unspecified: Secondary | ICD-10-CM

## 2012-12-31 DIAGNOSIS — I10 Essential (primary) hypertension: Secondary | ICD-10-CM | POA: Insufficient documentation

## 2012-12-31 DIAGNOSIS — K625 Hemorrhage of anus and rectum: Secondary | ICD-10-CM | POA: Insufficient documentation

## 2012-12-31 DIAGNOSIS — D649 Anemia, unspecified: Secondary | ICD-10-CM

## 2012-12-31 DIAGNOSIS — E119 Type 2 diabetes mellitus without complications: Secondary | ICD-10-CM | POA: Insufficient documentation

## 2012-12-31 HISTORY — PX: GIVENS CAPSULE STUDY: SHX5432

## 2012-12-31 SURGERY — IMAGING PROCEDURE, GI TRACT, INTRALUMINAL, VIA CAPSULE

## 2013-01-02 ENCOUNTER — Ambulatory Visit (INDEPENDENT_AMBULATORY_CARE_PROVIDER_SITE_OTHER): Payer: 59 | Admitting: Psychiatry

## 2013-01-02 ENCOUNTER — Encounter (HOSPITAL_COMMUNITY): Payer: Self-pay | Admitting: Gastroenterology

## 2013-01-02 DIAGNOSIS — F411 Generalized anxiety disorder: Secondary | ICD-10-CM

## 2013-01-02 DIAGNOSIS — F329 Major depressive disorder, single episode, unspecified: Secondary | ICD-10-CM

## 2013-01-02 NOTE — Patient Instructions (Signed)
Discussed orally 
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
## 2013-01-05 ENCOUNTER — Other Ambulatory Visit: Payer: Self-pay | Admitting: Gastroenterology

## 2013-01-05 DIAGNOSIS — D490 Neoplasm of unspecified behavior of digestive system: Secondary | ICD-10-CM

## 2013-01-05 NOTE — Telephone Encounter (Addendum)
Called patient TO DISCUSS RESULTS. PT HAS ULCER IN HER SMALL BOWEL. HAS NEVER HAD MI OR CVA. TAKES NSAIDs FOR ARTHRITIS. PT NEEDS ENTEROSCOPY TO BIOPSY SMALL BOWEL ULCERS ON JUN 20. IF NSAIDS ULCERS PT SHOULD HOLD ASA AND NAPROXEN UNLESS MEDICALLY NECESSARY. PT WILL CONTINUE MEDS FOR NOW UNTIL ENTEROSCOPY/PATH RESULTS ARE FINAL.

## 2013-01-05 NOTE — Telephone Encounter (Signed)
Patient is scheduled for June 20th an I have mailed her the instructions

## 2013-01-05 NOTE — Brief Op Note (Signed)
12/31/2012  3:30 PM  PATIENT:  Autumn Carson  57 y.o. female  PRE-OPERATIVE DIAGNOSIS:  ANEMIA OBSCURE GI BLEED  POST-OPERATIVE DIAGNOSIS:  SMALL BOWEL ULCERS  PROCEDURE:  Procedure(s) with comments: GIVENS CAPSULE STUDY (N/A) - 7:30  SURGEON:  Surgeon(s) and Role:    * West Bali, MD - Primary   PATIENT DATA: 300 LBS, HEIGHT: 66 IN, GASTRIC PASSAGE TIME: 36 m, SB PASSAGE TIME: 3H 58m  RESULTS: LIMITED views of gastric mucosa due to retained contents. No blood in the stomach. SMALL BOWEL ULCERS SEEN. NO OLD BLOOD OR FRESH BLOOD SEEN. LIMITED VIEWS OF THE COLON DUE TO RETAINED CONTENTS.  DIAGNOSIS: ANEMIA MOST LIKELY DUE TO NSAID ENTEROPATHY  Plan: 1. ENTEROSCOPY JUN 20 TO BIOPSY ULCERS 2. OPV IN 4 MOS 3. CONTINUE NSAIDS FOR NOW. WILL LIKELY NEED TO HOLD MEDS IF BENEFITS DO NOT OUTWEIGH THE RISKS.

## 2013-01-06 ENCOUNTER — Encounter (HOSPITAL_COMMUNITY): Payer: Self-pay | Admitting: Pharmacy Technician

## 2013-01-07 ENCOUNTER — Other Ambulatory Visit: Payer: Self-pay | Admitting: Family Medicine

## 2013-01-08 ENCOUNTER — Other Ambulatory Visit: Payer: Self-pay | Admitting: Family Medicine

## 2013-01-08 DIAGNOSIS — R29898 Other symptoms and signs involving the musculoskeletal system: Secondary | ICD-10-CM

## 2013-01-08 DIAGNOSIS — M549 Dorsalgia, unspecified: Secondary | ICD-10-CM

## 2013-01-08 MED ORDER — LEVOTHYROXINE SODIUM 175 MCG PO TABS: 175.0000 ug | ORAL_TABLET | Freq: Every day | ORAL | Status: DC

## 2013-01-08 NOTE — Progress Notes (Signed)
Patient has chronic back pain she had acute episode after moving some boxes. She now has some leg weakness as well as positive straight leg raise with radicular symptoms. His warts MRI. She's been on NSAIDs and has had physical therapy in the past.   I also received her labs from the program she is participating in her TSH shows she is being overtreated I will decrease her Synthroid to daily

## 2013-01-09 NOTE — Progress Notes (Signed)
Pt is aware.  

## 2013-01-16 ENCOUNTER — Encounter (HOSPITAL_COMMUNITY)
Admission: RE | Disposition: A | Discharge status: Home / Self Care | Payer: Self-pay | Source: Ambulatory Visit | Attending: Gastroenterology

## 2013-01-16 ENCOUNTER — Encounter (HOSPITAL_COMMUNITY): Payer: Self-pay | Admitting: *Deleted

## 2013-01-16 ENCOUNTER — Ambulatory Visit (HOSPITAL_COMMUNITY)
Admission: RE | Admit: 2013-01-16 | Discharge: 2013-01-16 | Disposition: A | Discharge status: Home / Self Care | Payer: Medicare Other | Source: Ambulatory Visit | Attending: Gastroenterology | Admitting: Gastroenterology

## 2013-01-16 DIAGNOSIS — I10 Essential (primary) hypertension: Secondary | ICD-10-CM | POA: Insufficient documentation

## 2013-01-16 DIAGNOSIS — K6389 Other specified diseases of intestine: Secondary | ICD-10-CM

## 2013-01-16 DIAGNOSIS — D509 Iron deficiency anemia, unspecified: Secondary | ICD-10-CM | POA: Insufficient documentation

## 2013-01-16 DIAGNOSIS — D649 Anemia, unspecified: Secondary | ICD-10-CM

## 2013-01-16 DIAGNOSIS — K319 Disease of stomach and duodenum, unspecified: Secondary | ICD-10-CM | POA: Insufficient documentation

## 2013-01-16 DIAGNOSIS — Z01812 Encounter for preprocedural laboratory examination: Secondary | ICD-10-CM | POA: Insufficient documentation

## 2013-01-16 DIAGNOSIS — K2960 Other gastritis without bleeding: Secondary | ICD-10-CM

## 2013-01-16 DIAGNOSIS — E119 Type 2 diabetes mellitus without complications: Secondary | ICD-10-CM | POA: Insufficient documentation

## 2013-01-16 DIAGNOSIS — K2940 Chronic atrophic gastritis without bleeding: Secondary | ICD-10-CM | POA: Insufficient documentation

## 2013-01-16 DIAGNOSIS — D490 Neoplasm of unspecified behavior of digestive system: Secondary | ICD-10-CM

## 2013-01-16 HISTORY — PX: ENTEROSCOPY: SHX5533

## 2013-01-16 HISTORY — PX: BIOPSY: SHX5522

## 2013-01-16 LAB — GLUCOSE, CAPILLARY: Glucose-Capillary: 108 mg/dL — ABNORMAL HIGH (ref 70–99)

## 2013-01-16 SURGERY — ENTEROSCOPY
Anesthesia: Moderate Sedation

## 2013-01-16 MED ORDER — BUTAMBEN-TETRACAINE-BENZOCAINE 2-2-14 % EX AERO
INHALATION_SPRAY | CUTANEOUS | Status: DC | PRN
  Administered 2013-01-16: 2 via TOPICAL

## 2013-01-16 MED ORDER — MEPERIDINE HCL 100 MG/ML IJ SOLN
INTRAMUSCULAR | Status: AC
  Filled 2013-01-16: qty 1

## 2013-01-16 MED ORDER — MIDAZOLAM HCL 5 MG/5ML IJ SOLN
INTRAMUSCULAR | Status: AC
  Filled 2013-01-16: qty 10

## 2013-01-16 MED ORDER — MEPERIDINE HCL 25 MG/ML IJ SOLN
INTRAMUSCULAR | Status: DC | PRN
  Administered 2013-01-16: 25 mg via INTRAVENOUS
  Administered 2013-01-16: 50 mg via INTRAVENOUS

## 2013-01-16 MED ORDER — MIDAZOLAM HCL 5 MG/5ML IJ SOLN
INTRAMUSCULAR | Status: DC | PRN
  Administered 2013-01-16: 2 mg via INTRAVENOUS
  Administered 2013-01-16: 1 mg via INTRAVENOUS
  Administered 2013-01-16: 2 mg via INTRAVENOUS

## 2013-01-16 MED ORDER — SODIUM CHLORIDE 0.9 % IV SOLN
INTRAVENOUS | Status: DC
  Administered 2013-01-16: 14:00:00 via INTRAVENOUS

## 2013-01-16 MED ORDER — PROMETHAZINE HCL 25 MG/ML IJ SOLN
INTRAMUSCULAR | Status: AC
  Filled 2013-01-16: qty 1

## 2013-01-16 MED ORDER — SODIUM CHLORIDE 0.9 % IJ SOLN
INTRAMUSCULAR | Status: AC
  Filled 2013-01-16: qty 10

## 2013-01-16 MED ORDER — PROMETHAZINE HCL 25 MG/ML IJ SOLN
12.5000 mg | Freq: Once | INTRAMUSCULAR | Status: AC
  Administered 2013-01-16: 12.5 mg via INTRAVENOUS

## 2013-01-16 MED ORDER — STERILE WATER FOR IRRIGATION IR SOLN
Status: DC | PRN
  Administered 2013-01-16: 14:00:00

## 2013-01-16 NOTE — H&P (Signed)
Primary Care Physician:  Milinda Antis, MD Primary Gastroenterologist:  Dr. Darrick Penna  Pre-Procedure History & Physical: HPI:  Autumn Carson is a 57 y.o. female here for ANEMIA/GIVENS JUN 2014 SM ULCERS.   Past Medical History  Diagnosis Date  . Neuropathy     due to diabetes  . Ulcer     on xray, remotely  . Diabetes mellitus     II  . Hypertension   . Asthma   . Obesity   . Cancer 2011    Papillary Thyroid cancer/Iodine Radiation treatments  . Anemia     chronic  . Anxiety   . Hypothyroidism   . GERD (gastroesophageal reflux disease)   . Back pain     Past Surgical History  Procedure Laterality Date  . Cesarean section    . Tubal ligation    . Tonsilectomy, adenoidectomy, bilateral myringotomy and tubes  1987  . Carpal tunnel release      4 total, bilaterally  . Rotator cuff repair      bilaterally  . Total thyroidectomy  2011  . Elbow surgery      bilaterally  . Colonoscopy with esophagogastroduodenoscopy (egd) N/A 12/08/2012    Procedure: COLONOSCOPY WITH ESOPHAGOGASTRODUODENOSCOPY (EGD);  Surgeon: West Bali, MD;  [facility];  Service: Endoscopy;  Laterality: N/A;  9:45  . Givens capsule study N/A 12/31/2012    Procedure: GIVENS CAPSULE STUDY;  Surgeon: West Bali, MD;  Location: [facility];  Service: Endoscopy;  Laterality: N/A;  7:30    Prior to Admission medications   Medication Sig Start Date End Date Taking? Authorizing Provider  albuterol (PROVENTIL HFA;VENTOLIN HFA) 108 (90 BASE) MCG/ACT inhaler Inhale 2 puffs into the lungs every 6 (six) hours as needed for wheezing.   Yes Historical Provider, MD  aspirin 81 MG tablet Take 81 mg by mouth daily.     Yes Historical Provider, MD  cyclobenzaprine (FLEXERIL) 10 MG tablet Take 1 tablet (10 mg total) by mouth every 8 (eight) hours as needed for muscle spasms. 12/15/12 12/15/13 Yes Salley Scarlet, MD  ferrous sulfate 325 (65 FE) MG EC tablet Take 325 mg by mouth daily with  breakfast.   Yes Historical Provider, MD  FLUoxetine (PROZAC) 40 MG capsule Take 1 capsule (40 mg total) by mouth daily. 11/03/12  Yes Salley Scarlet, MD  fluticasone [facility]) 50 MCG/ACT nasal spray Place 2 sprays into the nose daily.   Yes Historical Provider, MD  furosemide (LASIX) 20 MG tablet Take 1 tablet (20 mg total) by mouth daily. 11/03/12 11/03/13 Yes Salley Scarlet, MD  gabapentin (NEURONTIN) 300 MG capsule Take 300 mg by mouth 2 (two) times daily. 11/03/12  Yes Salley Scarlet, MD  levothyroxine (SYNTHROID) 175 MCG tablet Take 1 tablet (175 mcg total) by mouth daily before breakfast. 01/08/13  Yes Salley Scarlet, MD  lisinopril (PRINIVIL,ZESTRIL) 40 MG tablet Take 40 mg by mouth daily. 09/10/12  Yes Salley Scarlet, MD  metFORMIN (GLUCOPHAGE) 1000 MG tablet Take 1,000 mg by mouth 2 (two) times daily with a meal.   Yes Historical Provider, MD  naproxen (NAPROSYN) 500 MG tablet Take 500 mg by mouth 2 (two) times daily with a meal. 11/03/12  Yes Salley Scarlet, MD  omeprazole (PRILOSEC) 40 MG capsule Take 1 capsule (40 mg total) by mouth daily. 11/03/12  Yes Salley Scarlet, MD  oxyCODONE-acetaminophen (PERCOCET/ROXICET) 5-325 MG per tablet Take 1 tablet by mouth every 4 (four) hours  as needed for pain. 12/15/12  Yes Salley Scarlet, MD  simvastatin (ZOCOR) 40 MG tablet Take 1 tablet (40 mg total) by mouth daily. 11/03/12  Yes Salley Scarlet, MD  zolpidem (AMBIEN) 5 MG tablet Take 5 mg by mouth at bedtime as needed for sleep.   Yes Historical Provider, MD  lisinopril (PRINIVIL,ZESTRIL) 40 MG tablet TAKE 1 TABLET BY MOUTH DAILY 01/07/13   Salley Scarlet, MD    Allergies as of 01/05/2013 - Review Complete 12/31/2012  Allergen Reaction Noted  . Grapefruit oil Anaphylaxis 11/28/2012    Family History  Problem Relation Age of Onset  . Heart disease Mother   . Cancer Father     lung, age 89  . Cancer Brother     colon, age 28  . Heart disease Daughter 32    Heart Attack   .  Hypertension Daughter   . Other Brother     agent orange, age 21, cancer    History   Social History  . Marital Status: Married    Spouse Name: N/A    Number of Children: 2  . Years of Education: N/A   Occupational History  . disabled    Social History Main Topics  . Smoking status: Never Smoker   . Smokeless tobacco: Never Used  . Alcohol Use: No     Comment: occassional  . Drug Use: No  . Sexually Active: Not Currently    Birth Control/ Protection: Surgical   Other Topics Concern  . Not on file   Social History Narrative  . No narrative on file    Review of Systems: See HPI, otherwise negative ROS   Physical Exam: BP 126/56  Pulse 58  Temp(Src) 98.3 F (36.8 C) (Oral)  Resp 18  SpO2 97% General:   Alert,  pleasant and cooperative in NAD Head:  Normocephalic and atraumatic. Neck:  Supple; Lungs:  Clear throughout to auscultation.    Heart:  Regular rate and rhythm. Abdomen:  Soft, nontender and nondistended. Normal bowel sounds, without guarding, and without rebound.   Neurologic:  Alert and  oriented x4;  grossly normal neurologically.  Impression/Plan:     Anemia/heme pos stools  PLAN:  1. ENTEROSCOPY/Bx TODAY

## 2013-01-17 NOTE — Op Note (Signed)
[facility] [address] [place], [address]   OPERATIVE PROCEDURE REPORT  PATIENT: Autumn Carson, Autumn Carson  MR#: [id number] BIRTHDATE: Dec 02, 1955 , 57  yrs. old GENDER: Female ENDOSCOPIST: Jonette Eva, MD REFERRED BY:  Milinda Antis, M.D. PROCEDURE DATE: 01/16/2013 PROCEDURE:   Small bowel enteroscopy with biopsy ASA CLASS: INDICATIONS:1.  anemia/obscure gi bleed.  GIVENS ENDOSCOPY JUN 2014: SB EROSIONS/ULCERS IN PT USING ASA INTERMITTENTLY AND NAPROXEN BID FOR BACK PAIN. FERRITIN 2011 49. LMP: 3 YRS AGO  LABS 2011 TO PRESENT: HB 10.1-10.9, MCV 77.9-81.8  MEDICATIONS: Demerol 75 mg IV, Versed 5 mg IV, and Promethazine (Phenergan) 12.5mg  IV TOPICAL ANESTHETIC:   Cetacaine Spray  DESCRIPTION OF PROCEDURE:   After the risks benefits and alternatives of the procedure were thoroughly explained, informed consent was obtained.  The EC-[id number] ([id number])  endoscope was introduced through the mouth  and advanced to the proximal jejunum jejunum , limited by Without limitations.   The instrument was slowly withdrawn as the mucosa was fully examined.    Gastritis was found in the antrum.  FRIABLE/ERYTHEMATOUS PYLORUS. OCCASIONAL EROSION IN SMALL BOWEL. COLD FORCEPS BIOPSIES OBTAINED IN THE ANTRUM AND SMALL BOWEL.   The esophagus and gastroesophageal junction were completely normal in appearance.   Retroflexed views revealed no abnormalities.    The scope was then withdrawn from the patient and the procedure terminated.  COMPLICATIONS: There were no complications.  ENDOSCOPIC IMPRESSION: 1.   MILD ANEMIA/OBSCURE GIB DUE TO MILD Gastritis/ENTERITIS DUE TO NSAID USE  RECOMMENDATIONS: AVOID ASPIRIN UNTIL AFTER YOUR VISIT IN OCT 2014. RECHECK YOU BLOOD COUNT IN MID-September 2014. TAKE OMEPRAZOLE DAILY. FOLLOW A LOW FAT DIET.  SEE INFO BELOW. YOUR BIOPSY WILL BE BACK IN 7 TO 10 DAYS. FOLLOW UP OCT 2014.  REPEAT  EXAM:  _______________________________ Rosalie DoctorJonette Eva, MD 01/17/2013 2:50 PM   CC:  PATIENT NAME:  Autumn Carson MR#: [id number]

## 2013-01-21 ENCOUNTER — Other Ambulatory Visit: Payer: Self-pay

## 2013-01-21 ENCOUNTER — Encounter (HOSPITAL_COMMUNITY): Payer: Self-pay | Admitting: Gastroenterology

## 2013-01-21 ENCOUNTER — Telehealth: Payer: Self-pay | Admitting: Gastroenterology

## 2013-01-21 DIAGNOSIS — D638 Anemia in other chronic diseases classified elsewhere: Secondary | ICD-10-CM

## 2013-01-21 NOTE — Telephone Encounter (Signed)
Cc PCP 

## 2013-01-21 NOTE — Telephone Encounter (Signed)
LMOM to call.

## 2013-01-21 NOTE — Telephone Encounter (Addendum)
Please call pt. HER  Bx shows gastritis.   AVOID ASPIRIN UNTIL AFTER YOUR VISIT IN OCT 2014. RECHECK CBC IN MID-September 2014. IF HER BLOOD COUNT REMAINS LOW, SHE MAY NEED TO GO TO [facility] FOR AN ADDITIONAL EVALUATION OF HER GI TRACT. TAKE OMEPRAZOLE DAILY. FOLLOW A LOW FAT DIET.  FOLLOW UP OCT 2014.

## 2013-01-21 NOTE — Telephone Encounter (Signed)
Pt returned call and was informed. Lab order on file.

## 2013-01-22 ENCOUNTER — Encounter: Payer: Self-pay | Admitting: Family Medicine

## 2013-02-01 ENCOUNTER — Ambulatory Visit
Admission: RE | Admit: 2013-02-01 | Discharge: 2013-02-01 | Disposition: A | Discharge status: Home / Self Care | Payer: Medicare Other | Source: Ambulatory Visit | Attending: Family Medicine | Admitting: Family Medicine

## 2013-02-01 DIAGNOSIS — R29898 Other symptoms and signs involving the musculoskeletal system: Secondary | ICD-10-CM

## 2013-02-01 DIAGNOSIS — M549 Dorsalgia, unspecified: Secondary | ICD-10-CM

## 2013-02-02 ENCOUNTER — Other Ambulatory Visit: Payer: Self-pay | Admitting: Family Medicine

## 2013-02-02 DIAGNOSIS — M4716 Other spondylosis with myelopathy, lumbar region: Secondary | ICD-10-CM

## 2013-02-02 DIAGNOSIS — M48061 Spinal stenosis, lumbar region without neurogenic claudication: Secondary | ICD-10-CM

## 2013-02-03 ENCOUNTER — Ambulatory Visit: Payer: Self-pay | Admitting: Family Medicine

## 2013-02-03 NOTE — Telephone Encounter (Signed)
Reminder in epic °

## 2013-02-10 ENCOUNTER — Ambulatory Visit (INDEPENDENT_AMBULATORY_CARE_PROVIDER_SITE_OTHER): Payer: Medicare Other | Admitting: Family Medicine

## 2013-02-10 ENCOUNTER — Encounter: Payer: Self-pay | Admitting: Family Medicine

## 2013-02-10 VITALS — BP 130/80 | HR 64 | Temp 98.7°F | Resp 16 | Wt 304.0 lb

## 2013-02-10 DIAGNOSIS — E039 Hypothyroidism, unspecified: Secondary | ICD-10-CM

## 2013-02-10 DIAGNOSIS — E669 Obesity, unspecified: Secondary | ICD-10-CM | POA: Insufficient documentation

## 2013-02-10 DIAGNOSIS — I10 Essential (primary) hypertension: Secondary | ICD-10-CM

## 2013-02-10 DIAGNOSIS — E785 Hyperlipidemia, unspecified: Secondary | ICD-10-CM

## 2013-02-10 DIAGNOSIS — R809 Proteinuria, unspecified: Secondary | ICD-10-CM | POA: Insufficient documentation

## 2013-02-10 DIAGNOSIS — E119 Type 2 diabetes mellitus without complications: Secondary | ICD-10-CM

## 2013-02-10 DIAGNOSIS — D649 Anemia, unspecified: Secondary | ICD-10-CM

## 2013-02-10 LAB — LIPID PANEL
HDL: 40 mg/dL (ref >39)
LDL Cholesterol: 97 mg/dL (ref 0–99)
Total CHOL/HDL Ratio: 4.2 Ratio

## 2013-02-10 LAB — CBC WITH DIFFERENTIAL/PLATELET
Basophils Absolute: 0 10*3/uL (ref 0.0–0.1)
Basophils Relative: 1 % (ref 0–1)
Eosinophils Absolute: 0.2 10*3/uL (ref 0.0–0.7)
Eosinophils Relative: 3 % (ref 0–5)
Lymphs Abs: 1.7 10*3/uL (ref 0.7–4.0)
MCH: 25.1 pg — ABNORMAL LOW (ref 26.0–34.0)
MCHC: 32.6 g/dL (ref 30.0–36.0)
MCV: 77.1 fL — ABNORMAL LOW (ref 78.0–100.0)
Neutrophils Relative %: 57 % (ref 43–77)
Platelets: 286 10*3/uL (ref 150–400)
RDW: 16.6 % — ABNORMAL HIGH (ref 11.5–15.5)

## 2013-02-10 LAB — COMPREHENSIVE METABOLIC PANEL
ALT: 13 U/L (ref 0–35)
Albumin: 4.3 g/dL (ref 3.5–5.2)
Alkaline Phosphatase: 63 U/L (ref 39–117)
Glucose, Bld: 94 mg/dL (ref 70–99)
Potassium: 4.1 mEq/L (ref 3.5–5.3)
Sodium: 140 mEq/L (ref 135–145)
Total Bilirubin: 0.4 mg/dL (ref 0.3–1.2)
Total Protein: 7 g/dL (ref 6.0–8.3)

## 2013-02-10 LAB — MICROALBUMIN, URINE: Microalb, Ur: 2.38 mg/dL — ABNORMAL HIGH (ref 0.00–1.89)

## 2013-02-10 LAB — HEMOGLOBIN A1C: Mean Plasma Glucose: 123 mg/dL — ABNORMAL HIGH (ref <117)

## 2013-02-10 NOTE — Assessment & Plan Note (Signed)
Chronic anemia followed by GI our repeat her CBC

## 2013-02-10 NOTE — Patient Instructions (Addendum)
Continue current medications Bring the form back I will send a message to Dr. Sharl Ma Have Dr. Dutch Quint send me his records F/U 3 months

## 2013-02-10 NOTE — Assessment & Plan Note (Signed)
She's followed by endocrinology I did not remember this when I changed her Synthroid to 175 mcg. She is to see him next week as well as repeat labs for thyroid studies. I will forward a copy  Of her labs and the date of the change

## 2013-02-10 NOTE — Assessment & Plan Note (Signed)
Currently on Zocor recheck her fasting lipid panel and her LFTs

## 2013-02-10 NOTE — Progress Notes (Signed)
  Subjective:    Patient ID: Autumn Carson, female    DOB: 10-30-1955, 57 y.o.   MRN: [id number]  HPI Patient here to follow chronic medical problems. She is due to see neurosurgery this week secondary to her back pain with radicular symptoms her MRI showed some spinal stenosis with degenerative disc disease. She is having more radicular symptoms on the left side which is consistent with her MRI. She's still using her Percocet gabapentin Flexeril as needed.  Hypothyroidism she is currently followed by endocrinology when I received her labs [facility] I actually change her Synthroid 175 mcg, which was decreased from 200 mcg.  Diabetes mellitus she's tolerating her metformin no hypoglycemic symptoms. CBG have been 88-114 fasting, she does test BID   Note ASA and iron on hold by GI due to gastritis Review of Systems   GEN- denies fatigue, fever, weight loss,weakness, recent illness HEENT- denies eye drainage, change in vision, nasal discharge, CVS- denies chest pain, palpitations RESP- denies SOB, cough, wheeze ABD- denies N/V, change in stools, abd pain GU- denies dysuria, hematuria, dribbling, incontinence MSK- + joint pain, muscle aches, injury Neuro- denies headache, dizziness, syncope, seizure activity      Objective:   Physical Exam GEN- NAD, alert and oriented x3, obese HEENT- PERRL, EOMI, non injected sclera, pink conjunctiva, MMM, oropharynx clear Neck- Supple, CVS- RRR, no murmur RESP-CTAB EXT- trace pedal edema Pulses- Radial, DP- 2+        Assessment & Plan:

## 2013-02-10 NOTE — Assessment & Plan Note (Signed)
Well controlled 

## 2013-02-10 NOTE — Assessment & Plan Note (Signed)
Discussed diet, her exercise is limited due to her back

## 2013-02-10 NOTE — Assessment & Plan Note (Signed)
She had a positive urine microalbumin that was sent to me from a study she was involved with [facility]. I will have this repeated today she is on an ACE inhibitor her diabetes has been well-controlled blood pressure is well-controlled We may need a renal ultrasound depending on result

## 2013-02-10 NOTE — Assessment & Plan Note (Signed)
Her fasting blood sugars show pretty good control. I'll recheck her A1c today

## 2013-02-25 ENCOUNTER — Other Ambulatory Visit: Payer: Self-pay | Admitting: Family Medicine

## 2013-03-05 ENCOUNTER — Other Ambulatory Visit: Payer: Self-pay | Admitting: Neurosurgery

## 2013-03-12 ENCOUNTER — Encounter: Payer: Self-pay | Admitting: Gastroenterology

## 2013-03-12 ENCOUNTER — Telehealth: Payer: Self-pay | Admitting: Gastroenterology

## 2013-03-12 NOTE — Telephone Encounter (Signed)
Sept recall has that patient will need repeat CBC

## 2013-03-13 ENCOUNTER — Other Ambulatory Visit: Payer: Self-pay

## 2013-03-13 DIAGNOSIS — D649 Anemia, unspecified: Secondary | ICD-10-CM

## 2013-03-13 NOTE — Telephone Encounter (Signed)
Lab order was mailed by [facility].

## 2013-03-19 ENCOUNTER — Other Ambulatory Visit: Payer: Self-pay | Admitting: Family Medicine

## 2013-03-23 ENCOUNTER — Other Ambulatory Visit: Payer: Self-pay

## 2013-03-23 DIAGNOSIS — D638 Anemia in other chronic diseases classified elsewhere: Secondary | ICD-10-CM

## 2013-03-31 ENCOUNTER — Encounter (HOSPITAL_COMMUNITY): Payer: Self-pay

## 2013-03-31 ENCOUNTER — Other Ambulatory Visit (HOSPITAL_COMMUNITY): Payer: Self-pay | Admitting: *Deleted

## 2013-03-31 NOTE — Pre-Procedure Instructions (Signed)
Autumn Carson  03/31/2013   Your procedure is scheduled on:  April 07, 2013 at 7:30 AM  Report to [facility] at 5:30 AM.  Call this number if you have problems the morning of surgery: [phone]   Remember:   Do not eat food or drink liquids after midnight.   Take these medicines the morning of surgery with A SIP OF WATER: albuterol (PROVENTIL HFA;VENTOLIN HFA) inhaler, cyclobenzaprine (FLEXERIL),  FLUoxetine (PROZAC), gabapentin (NEURONTIN), levothyroxine (SYNTHROID, LEVOTHROID), omeprazole (PRILOSEC) Stop all Vitamins, Herbal Medications, Aspirin, and Non-steroidals (Ibuprofen, Motrin, Naprosyn, Naproxen, etc.) as of today 04/01/13        Do not wear jewelry, make-up or nail polish.  Do not wear lotions, powders, or perfumes. You may wear deodorant.  Do not shave 48 hours prior to surgery. Men may shave face and neck.  Do not bring valuables to the hospital.  [facility] is not responsible                   for any belongings or valuables.  Contacts, dentures or bridgework may not be worn into surgery.  Leave suitcase in the car. After surgery it may be brought to your room.  For patients admitted to the hospital, checkout time is 11:00 AM the day of  discharge.    Special Instructions: Shower using CHG 2 nights before surgery and the night before surgery.  If you shower the day of surgery use CHG.  Use special wash - you have one bottle of CHG for all showers.  You should use approximately 1/3 of the bottle for each shower.   Please read over the following fact sheets that you were given: Pain Booklet, Coughing and Deep Breathing, Blood Transfusion Information, MRSA Information and Surgical Site Infection Prevention

## 2013-04-01 ENCOUNTER — Ambulatory Visit (HOSPITAL_COMMUNITY)
Admission: RE | Admit: 2013-04-01 | Discharge: 2013-04-01 | Disposition: A | Discharge status: Home / Self Care | Payer: Medicare Other | Source: Ambulatory Visit | Attending: Neurosurgery | Admitting: Neurosurgery

## 2013-04-01 ENCOUNTER — Encounter (HOSPITAL_COMMUNITY)
Admission: RE | Admit: 2013-04-01 | Discharge: 2013-04-01 | Disposition: A | Discharge status: Home / Self Care | Payer: Medicare Other | Source: Ambulatory Visit | Attending: Neurosurgery | Admitting: Neurosurgery

## 2013-04-01 ENCOUNTER — Encounter (HOSPITAL_COMMUNITY): Payer: Self-pay

## 2013-04-01 DIAGNOSIS — Z01818 Encounter for other preprocedural examination: Secondary | ICD-10-CM | POA: Insufficient documentation

## 2013-04-01 DIAGNOSIS — Z01812 Encounter for preprocedural laboratory examination: Secondary | ICD-10-CM | POA: Insufficient documentation

## 2013-04-01 DIAGNOSIS — I498 Other specified cardiac arrhythmias: Secondary | ICD-10-CM | POA: Insufficient documentation

## 2013-04-01 DIAGNOSIS — Z01810 Encounter for preprocedural cardiovascular examination: Secondary | ICD-10-CM | POA: Insufficient documentation

## 2013-04-01 DIAGNOSIS — I440 Atrioventricular block, first degree: Secondary | ICD-10-CM | POA: Insufficient documentation

## 2013-04-01 DIAGNOSIS — I10 Essential (primary) hypertension: Secondary | ICD-10-CM | POA: Insufficient documentation

## 2013-04-01 LAB — CBC WITH DIFFERENTIAL/PLATELET
Eosinophils Absolute: 0.2 10*3/uL (ref 0.0–0.7)
HCT: 35.6 % — ABNORMAL LOW (ref 36.0–46.0)
Hemoglobin: 11.4 g/dL — ABNORMAL LOW (ref 12.0–15.0)
Lymphs Abs: 1.9 10*3/uL (ref 0.7–4.0)
MCH: 25.6 pg — ABNORMAL LOW (ref 26.0–34.0)
Monocytes Relative: 6 % (ref 3–12)
Neutro Abs: 2.7 10*3/uL (ref 1.7–7.7)
Neutrophils Relative %: 53 % (ref 43–77)
RBC: 4.45 MIL/uL (ref 3.87–5.11)

## 2013-04-01 LAB — BASIC METABOLIC PANEL
CO2: 29 mEq/L (ref 19–32)
Chloride: 102 mEq/L (ref 96–112)
Creatinine, Ser: 0.85 mg/dL (ref 0.50–1.10)
Glucose, Bld: 111 mg/dL — ABNORMAL HIGH (ref 70–99)

## 2013-04-01 LAB — TYPE AND SCREEN: Antibody Screen: NEGATIVE

## 2013-04-01 LAB — SURGICAL PCR SCREEN: MRSA, PCR: NEGATIVE

## 2013-04-01 NOTE — Progress Notes (Signed)
04/01/13 1445  OBSTRUCTIVE SLEEP APNEA  Have you ever been diagnosed with sleep apnea through a sleep study? No  Do you snore loudly (loud enough to be heard through closed doors)?  1  Do you often feel tired, fatigued, or sleepy during the daytime? 1  Has anyone observed you stop breathing during your sleep? 0  Do you have, or are you being treated for high blood pressure? 1  BMI more than 35 kg/m2? 1  Age over 55 years old? 1  Neck circumference greater than 40 cm/18 inches? 1  Gender: 0  Obstructive Sleep Apnea Score 6   

## 2013-04-01 NOTE — Progress Notes (Signed)
04/01/13 1445  OBSTRUCTIVE SLEEP APNEA  Have you ever been diagnosed with sleep apnea through a sleep study? No  Do you snore loudly (loud enough to be heard through closed doors)?  1  Do you often feel tired, fatigued, or sleepy during the daytime? 1  Has anyone observed you stop breathing during your sleep? 0  Do you have, or are you being treated for high blood pressure? 1  BMI more than 35 kg/m2? 1  Age over 55 years old? 1  Neck circumference greater than 40 cm/18 inches? 1  Gender: 0  Obstructive Sleep Apnea Score 6

## 2013-04-06 MED ORDER — DEXTROSE 5 % IV SOLN
3.0000 g | INTRAVENOUS | Status: AC
  Administered 2013-04-07: 3 g via INTRAVENOUS
  Filled 2013-04-06: qty 3000

## 2013-04-06 MED ORDER — DEXAMETHASONE SODIUM PHOSPHATE 10 MG/ML IJ SOLN
10.0000 mg | INTRAMUSCULAR | Status: AC
  Administered 2013-04-07: 10 mg via INTRAVENOUS
  Filled 2013-04-06: qty 1

## 2013-04-07 ENCOUNTER — Inpatient Hospital Stay (HOSPITAL_COMMUNITY)
Admission: RE | Admit: 2013-04-07 | Discharge: 2013-04-09 | DRG: 460 | Disposition: A | Discharge status: Home / Self Care | Payer: Medicare Other | Source: Ambulatory Visit | Attending: Neurosurgery | Admitting: Neurosurgery

## 2013-04-07 ENCOUNTER — Encounter (HOSPITAL_COMMUNITY): Payer: Self-pay | Admitting: Anesthesiology

## 2013-04-07 ENCOUNTER — Inpatient Hospital Stay (HOSPITAL_COMMUNITY): Payer: Medicare Other

## 2013-04-07 ENCOUNTER — Encounter (HOSPITAL_COMMUNITY)
Admission: RE | Disposition: A | Discharge status: Home / Self Care | Payer: Self-pay | Source: Ambulatory Visit | Attending: Neurosurgery

## 2013-04-07 ENCOUNTER — Inpatient Hospital Stay (HOSPITAL_COMMUNITY): Payer: Medicare Other | Admitting: Anesthesiology

## 2013-04-07 ENCOUNTER — Encounter (HOSPITAL_COMMUNITY): Payer: Self-pay | Admitting: Surgery

## 2013-04-07 DIAGNOSIS — M4310 Spondylolisthesis, site unspecified: Secondary | ICD-10-CM | POA: Diagnosis present

## 2013-04-07 DIAGNOSIS — M48062 Spinal stenosis, lumbar region with neurogenic claudication: Principal | ICD-10-CM | POA: Diagnosis present

## 2013-04-07 DIAGNOSIS — E119 Type 2 diabetes mellitus without complications: Secondary | ICD-10-CM | POA: Diagnosis present

## 2013-04-07 DIAGNOSIS — I10 Essential (primary) hypertension: Secondary | ICD-10-CM | POA: Diagnosis present

## 2013-04-07 LAB — GLUCOSE, CAPILLARY
Glucose-Capillary: 107 mg/dL — ABNORMAL HIGH (ref 70–99)
Glucose-Capillary: 138 mg/dL — ABNORMAL HIGH (ref 70–99)

## 2013-04-07 SURGERY — POSTERIOR LUMBAR FUSION 2 LEVEL
Anesthesia: General | Site: Back | Wound class: Clean

## 2013-04-07 MED ORDER — SODIUM CHLORIDE 0.9 % IJ SOLN
3.0000 mL | Freq: Two times a day (BID) | INTRAMUSCULAR | Status: DC
  Administered 2013-04-07 – 2013-04-08 (×3): 3 mL via INTRAVENOUS

## 2013-04-07 MED ORDER — FERROUS SULFATE 325 (65 FE) MG PO TABS
325.0000 mg | ORAL_TABLET | Freq: Every day | ORAL | Status: DC
  Administered 2013-04-08 – 2013-04-09 (×2): 325 mg via ORAL
  Filled 2013-04-07 (×3): qty 1

## 2013-04-07 MED ORDER — FERROUS SULFATE 325 (65 FE) MG PO TBEC: 325.0000 mg | DELAYED_RELEASE_TABLET | Freq: Every day | ORAL | Status: DC

## 2013-04-07 MED ORDER — SIMVASTATIN 40 MG PO TABS
40.0000 mg | ORAL_TABLET | Freq: Every day | ORAL | Status: DC
  Administered 2013-04-07 – 2013-04-09 (×3): 40 mg via ORAL
  Filled 2013-04-07 (×3): qty 1

## 2013-04-07 MED ORDER — HYDROMORPHONE HCL PF 1 MG/ML IJ SOLN: 0.5000 mg | INTRAMUSCULAR | Status: DC | PRN

## 2013-04-07 MED ORDER — SODIUM CHLORIDE 0.9 % IR SOLN
Status: DC | PRN
  Administered 2013-04-07: 07:00:00

## 2013-04-07 MED ORDER — BISACODYL 10 MG RE SUPP: 10.0000 mg | Freq: Every day | RECTAL | Status: DC | PRN

## 2013-04-07 MED ORDER — METOCLOPRAMIDE HCL 5 MG/ML IJ SOLN: 10.0000 mg | Freq: Once | INTRAMUSCULAR | Status: DC | PRN

## 2013-04-07 MED ORDER — POLYETHYLENE GLYCOL 3350 17 G PO PACK
17.0000 g | PACK | Freq: Every day | ORAL | Status: DC | PRN
  Filled 2013-04-07: qty 1

## 2013-04-07 MED ORDER — ARTIFICIAL TEARS OP OINT
TOPICAL_OINTMENT | OPHTHALMIC | Status: DC | PRN
  Administered 2013-04-07: 1 via OPHTHALMIC

## 2013-04-07 MED ORDER — DIAZEPAM 5 MG PO TABS
5.0000 mg | ORAL_TABLET | Freq: Four times a day (QID) | ORAL | Status: DC | PRN
  Administered 2013-04-08: 5 mg via ORAL
  Filled 2013-04-07: qty 1

## 2013-04-07 MED ORDER — OXYCODONE-ACETAMINOPHEN 5-325 MG PO TABS
1.0000 | ORAL_TABLET | ORAL | Status: DC | PRN
  Administered 2013-04-07 – 2013-04-08 (×5): 2 via ORAL
  Filled 2013-04-07 (×5): qty 2

## 2013-04-07 MED ORDER — BUPIVACAINE HCL (PF) 0.25 % IJ SOLN
INTRAMUSCULAR | Status: DC | PRN
  Administered 2013-04-07: 30 mL

## 2013-04-07 MED ORDER — LISINOPRIL 5 MG PO TABS
5.0000 mg | ORAL_TABLET | Freq: Every day | ORAL | Status: DC
  Administered 2013-04-07 – 2013-04-09 (×3): 5 mg via ORAL
  Filled 2013-04-07 (×3): qty 1

## 2013-04-07 MED ORDER — SODIUM CHLORIDE 0.9 % IV SOLN: 250.0000 mL | INTRAVENOUS | Status: DC

## 2013-04-07 MED ORDER — OXYCODONE HCL 5 MG PO TABS: 5.0000 mg | ORAL_TABLET | Freq: Once | ORAL | Status: DC | PRN

## 2013-04-07 MED ORDER — SENNA 8.6 MG PO TABS
1.0000 | ORAL_TABLET | Freq: Two times a day (BID) | ORAL | Status: DC
  Administered 2013-04-07 – 2013-04-09 (×5): 8.6 mg via ORAL
  Filled 2013-04-07 (×6): qty 1

## 2013-04-07 MED ORDER — NEOSTIGMINE METHYLSULFATE 1 MG/ML IJ SOLN
INTRAMUSCULAR | Status: DC | PRN
  Administered 2013-04-07: 5 mg via INTRAVENOUS

## 2013-04-07 MED ORDER — FLUTICASONE PROPIONATE 50 MCG/ACT NA SUSP
2.0000 | Freq: Every day | NASAL | Status: DC
  Administered 2013-04-07 – 2013-04-09 (×3): 2 via NASAL
  Filled 2013-04-07: qty 16

## 2013-04-07 MED ORDER — HYDROMORPHONE HCL PF 1 MG/ML IJ SOLN: 0.2500 mg | INTRAMUSCULAR | Status: DC | PRN

## 2013-04-07 MED ORDER — GLYCOPYRROLATE 0.2 MG/ML IJ SOLN
INTRAMUSCULAR | Status: DC | PRN
  Administered 2013-04-07: .8 mg via INTRAVENOUS
  Administered 2013-04-07 (×2): 0.2 mg via INTRAVENOUS

## 2013-04-07 MED ORDER — FLEET ENEMA 7-19 GM/118ML RE ENEM: 1.0000 | ENEMA | Freq: Once | RECTAL | Status: AC | PRN

## 2013-04-07 MED ORDER — CEFAZOLIN SODIUM 1-5 GM-% IV SOLN
1.0000 g | Freq: Three times a day (TID) | INTRAVENOUS | Status: AC
  Administered 2013-04-07 (×2): 1 g via INTRAVENOUS
  Filled 2013-04-07 (×2): qty 50

## 2013-04-07 MED ORDER — GABAPENTIN 300 MG PO CAPS
300.0000 mg | ORAL_CAPSULE | Freq: Two times a day (BID) | ORAL | Status: DC
  Administered 2013-04-07 – 2013-04-09 (×5): 300 mg via ORAL
  Filled 2013-04-07 (×6): qty 1

## 2013-04-07 MED ORDER — FLUOXETINE HCL 40 MG PO CAPS: 40.0000 mg | ORAL_CAPSULE | Freq: Every day | ORAL | Status: DC

## 2013-04-07 MED ORDER — THROMBIN 20000 UNITS EX SOLR
CUTANEOUS | Status: DC | PRN
  Administered 2013-04-07 (×2): via TOPICAL

## 2013-04-07 MED ORDER — FLUOXETINE HCL 20 MG PO CAPS
40.0000 mg | ORAL_CAPSULE | Freq: Every day | ORAL | Status: DC
  Administered 2013-04-08 – 2013-04-09 (×2): 40 mg via ORAL
  Filled 2013-04-07 (×3): qty 2

## 2013-04-07 MED ORDER — 0.9 % SODIUM CHLORIDE (POUR BTL) OPTIME
TOPICAL | Status: DC | PRN
  Administered 2013-04-07: 1000 mL

## 2013-04-07 MED ORDER — SODIUM CHLORIDE 0.9 % IJ SOLN: 3.0000 mL | INTRAMUSCULAR | Status: DC | PRN

## 2013-04-07 MED ORDER — ROCURONIUM BROMIDE 100 MG/10ML IV SOLN
INTRAVENOUS | Status: DC | PRN
  Administered 2013-04-07: 50 mg via INTRAVENOUS
  Administered 2013-04-07: 15 mg via INTRAVENOUS
  Administered 2013-04-07: 20 mg via INTRAVENOUS

## 2013-04-07 MED ORDER — METFORMIN HCL 500 MG PO TABS
1000.0000 mg | ORAL_TABLET | Freq: Two times a day (BID) | ORAL | Status: DC
  Administered 2013-04-07 – 2013-04-09 (×4): 1000 mg via ORAL
  Filled 2013-04-07 (×6): qty 2

## 2013-04-07 MED ORDER — HYDROCODONE-ACETAMINOPHEN 5-325 MG PO TABS: 1.0000 | ORAL_TABLET | ORAL | Status: DC | PRN

## 2013-04-07 MED ORDER — FUROSEMIDE 20 MG PO TABS
20.0000 mg | ORAL_TABLET | Freq: Every day | ORAL | Status: DC
  Administered 2013-04-08 – 2013-04-09 (×2): 20 mg via ORAL
  Filled 2013-04-07 (×3): qty 1

## 2013-04-07 MED ORDER — ALUM & MAG HYDROXIDE-SIMETH 200-200-20 MG/5ML PO SUSP: 30.0000 mL | Freq: Four times a day (QID) | ORAL | Status: DC | PRN

## 2013-04-07 MED ORDER — ACETAMINOPHEN 650 MG RE SUPP: 650.0000 mg | RECTAL | Status: DC | PRN

## 2013-04-07 MED ORDER — ONDANSETRON HCL 4 MG/2ML IJ SOLN: 4.0000 mg | INTRAMUSCULAR | Status: DC | PRN

## 2013-04-07 MED ORDER — ALBUTEROL SULFATE HFA 108 (90 BASE) MCG/ACT IN AERS
2.0000 | INHALATION_SPRAY | Freq: Four times a day (QID) | RESPIRATORY_TRACT | Status: DC | PRN
  Filled 2013-04-07: qty 6.7

## 2013-04-07 MED ORDER — PHENOL 1.4 % MT LIQD: 1.0000 | OROMUCOSAL | Status: DC | PRN

## 2013-04-07 MED ORDER — ONDANSETRON HCL 4 MG/2ML IJ SOLN
INTRAMUSCULAR | Status: DC | PRN
  Administered 2013-04-07: 4 mg via INTRAVENOUS

## 2013-04-07 MED ORDER — FENTANYL CITRATE 0.05 MG/ML IJ SOLN
INTRAMUSCULAR | Status: DC | PRN
  Administered 2013-04-07 (×2): 50 ug via INTRAVENOUS
  Administered 2013-04-07: 100 ug via INTRAVENOUS
  Administered 2013-04-07: 50 ug via INTRAVENOUS

## 2013-04-07 MED ORDER — ZOLPIDEM TARTRATE 5 MG PO TABS
5.0000 mg | ORAL_TABLET | Freq: Every evening | ORAL | Status: DC | PRN
  Administered 2013-04-08: 5 mg via ORAL
  Filled 2013-04-07: qty 1

## 2013-04-07 MED ORDER — ACETAMINOPHEN 325 MG PO TABS: 650.0000 mg | ORAL_TABLET | ORAL | Status: DC | PRN

## 2013-04-07 MED ORDER — LACTATED RINGERS IV SOLN
INTRAVENOUS | Status: DC | PRN
  Administered 2013-04-07 (×2): via INTRAVENOUS

## 2013-04-07 MED ORDER — MIDAZOLAM HCL 5 MG/5ML IJ SOLN
INTRAMUSCULAR | Status: DC | PRN
  Administered 2013-04-07: 2 mg via INTRAVENOUS

## 2013-04-07 MED ORDER — OXYCODONE HCL 5 MG/5ML PO SOLN: 5.0000 mg | Freq: Once | ORAL | Status: DC | PRN

## 2013-04-07 MED ORDER — LEVOTHYROXINE SODIUM 200 MCG PO TABS
200.0000 ug | ORAL_TABLET | Freq: Every day | ORAL | Status: DC
  Administered 2013-04-08 – 2013-04-09 (×2): 200 ug via ORAL
  Filled 2013-04-07 (×3): qty 1

## 2013-04-07 MED ORDER — LIDOCAINE HCL (CARDIAC) 20 MG/ML IV SOLN
INTRAVENOUS | Status: DC | PRN
  Administered 2013-04-07: 100 mg via INTRAVENOUS

## 2013-04-07 MED ORDER — PANTOPRAZOLE SODIUM 40 MG PO TBEC
40.0000 mg | DELAYED_RELEASE_TABLET | Freq: Every day | ORAL | Status: DC
  Administered 2013-04-07 – 2013-04-09 (×3): 40 mg via ORAL
  Filled 2013-04-07 (×2): qty 1

## 2013-04-07 MED ORDER — MENTHOL 3 MG MT LOZG: 1.0000 | LOZENGE | OROMUCOSAL | Status: DC | PRN

## 2013-04-07 MED ORDER — PROPOFOL 10 MG/ML IV BOLUS
INTRAVENOUS | Status: DC | PRN
  Administered 2013-04-07: 200 mg via INTRAVENOUS

## 2013-04-07 SURGICAL SUPPLY — 69 items
ADH SKN CLS APL DERMABOND .7 (GAUZE/BANDAGES/DRESSINGS) ×2
APL SKNCLS STERI-STRIP NONHPOA (GAUZE/BANDAGES/DRESSINGS) ×1
BAG DECANTER FOR FLEXI CONT (MISCELLANEOUS) ×2 IMPLANT
BENZOIN TINCTURE PRP APPL 2/3 (GAUZE/BANDAGES/DRESSINGS) ×2 IMPLANT
BLADE SURG ROTATE 9660 (MISCELLANEOUS) IMPLANT
BRUSH SCRUB EZ PLAIN DRY (MISCELLANEOUS) ×2 IMPLANT
BUR MATCHSTICK NEURO 3.0 LAGG (BURR) ×2 IMPLANT
CANISTER SUCTION 2500CC (MISCELLANEOUS) ×2 IMPLANT
CAP LCK SPNE (Orthopedic Implant) ×6 IMPLANT
CAP LOCK SPINE RADIUS (Orthopedic Implant) IMPLANT
CAP LOCKING (Orthopedic Implant) ×12 IMPLANT
CLOTH BEACON ORANGE TIMEOUT ST (SAFETY) ×2 IMPLANT
CONT SPEC 4OZ CLIKSEAL STRL BL (MISCELLANEOUS) ×4 IMPLANT
COVER BACK TABLE 24X17X13 BIG (DRAPES) IMPLANT
COVER TABLE BACK 60X90 (DRAPES) ×2 IMPLANT
CROSSLINK MEDIUM (Orthopedic Implant) ×1 IMPLANT
DECANTER SPIKE VIAL GLASS SM (MISCELLANEOUS) ×2 IMPLANT
DERMABOND ADVANCED (GAUZE/BANDAGES/DRESSINGS) ×2
DERMABOND ADVANCED .7 DNX12 (GAUZE/BANDAGES/DRESSINGS) ×1 IMPLANT
DRAPE C-ARM 42X72 X-RAY (DRAPES) ×4 IMPLANT
DRAPE LAPAROTOMY 100X72X124 (DRAPES) ×2 IMPLANT
DRAPE POUCH INSTRU U-SHP 10X18 (DRAPES) ×2 IMPLANT
DRAPE PROXIMA HALF (DRAPES) IMPLANT
DRAPE SURG 17X23 STRL (DRAPES) ×8 IMPLANT
DRSG OPSITE POSTOP 4X6 (GAUZE/BANDAGES/DRESSINGS) ×2 IMPLANT
DURAPREP 26ML APPLICATOR (WOUND CARE) ×2 IMPLANT
ELECT REM PT RETURN 9FT ADLT (ELECTROSURGICAL) ×2
ELECTRODE REM PT RTRN 9FT ADLT (ELECTROSURGICAL) ×1 IMPLANT
EVACUATOR 1/8 PVC DRAIN (DRAIN) ×2 IMPLANT
GAUZE SPONGE 4X4 16PLY XRAY LF (GAUZE/BANDAGES/DRESSINGS) IMPLANT
GLOVE BIO SURGEON STRL SZ7.5 (GLOVE) ×1 IMPLANT
GLOVE BIOGEL PI IND STRL 8 (GLOVE) IMPLANT
GLOVE BIOGEL PI IND STRL 8.5 (GLOVE) IMPLANT
GLOVE BIOGEL PI INDICATOR 8 (GLOVE) ×1
GLOVE BIOGEL PI INDICATOR 8.5 (GLOVE) ×2
GLOVE ECLIPSE 8.5 STRL (GLOVE) ×6 IMPLANT
GLOVE EXAM NITRILE LRG STRL (GLOVE) ×2 IMPLANT
GLOVE EXAM NITRILE MD LF STRL (GLOVE) IMPLANT
GLOVE EXAM NITRILE XL STR (GLOVE) IMPLANT
GLOVE EXAM NITRILE XS STR PU (GLOVE) IMPLANT
GLOVE OPTIFIT SS 6.5 STRL BRWN (GLOVE) ×3 IMPLANT
GLOVE SS N UNI LF 7.0 STRL (GLOVE) ×1 IMPLANT
GOWN BRE IMP SLV AUR LG STRL (GOWN DISPOSABLE) ×1 IMPLANT
GOWN BRE IMP SLV AUR XL STRL (GOWN DISPOSABLE) ×4 IMPLANT
GOWN STRL REIN 2XL LVL4 (GOWN DISPOSABLE) ×2 IMPLANT
KIT BASIN OR (CUSTOM PROCEDURE TRAY) ×2 IMPLANT
KIT ROOM TURNOVER OR (KITS) ×2 IMPLANT
MILL MEDIUM DISP (BLADE) ×1 IMPLANT
NEEDLE HYPO 22GX1.5 SAFETY (NEEDLE) ×2 IMPLANT
NS IRRIG 1000ML POUR BTL (IV SOLUTION) ×2 IMPLANT
PACK LAMINECTOMY NEURO (CUSTOM PROCEDURE TRAY) ×2 IMPLANT
ROD 5.5X60MM PURPLE (Rod) ×1 IMPLANT
ROD 70MM (Rod) ×2 IMPLANT
ROD SPNL 70X5.5XNS TI RDS (Rod) IMPLANT
SCREW 6.75X40MM (Screw) ×4 IMPLANT
SCREW 6.75X45MM (Screw) ×2 IMPLANT
SPONGE GAUZE 4X4 12PLY (GAUZE/BANDAGES/DRESSINGS) ×2 IMPLANT
SPONGE SURGIFOAM ABS GEL 100 (HEMOSTASIS) ×3 IMPLANT
STRIP CLOSURE SKIN 1/2X4 (GAUZE/BANDAGES/DRESSINGS) ×3 IMPLANT
SUT VIC AB 0 CT1 18XCR BRD8 (SUTURE) ×2 IMPLANT
SUT VIC AB 0 CT1 8-18 (SUTURE) ×4
SUT VIC AB 2-0 CT1 18 (SUTURE) ×3 IMPLANT
SUT VIC AB 3-0 SH 8-18 (SUTURE) ×4 IMPLANT
SYR 20ML ECCENTRIC (SYRINGE) ×2 IMPLANT
TOWEL OR 17X24 6PK STRL BLUE (TOWEL DISPOSABLE) ×2 IMPLANT
TOWEL OR 17X26 10 PK STRL BLUE (TOWEL DISPOSABLE) ×2 IMPLANT
TRAY FOLEY CATH 14FRSI W/METER (CATHETERS) ×2 IMPLANT
WATER STERILE IRR 1000ML POUR (IV SOLUTION) ×2 IMPLANT
WEDGE TANGENT 10X26MM ×2 IMPLANT

## 2013-04-07 NOTE — H&P (Signed)
Autumn Carson is an 57 y.o. female.   Chief Complaint: Back and bilateral leg pain HPI: 57 year old female presents with severe back and bilateral leg pain worse with standing and ambulation and consistent with neurogenic claudication. Workup demonstrates evidence of moderately severe stenosis at L3-4 and moderate stenosis at L4-5 complicated by anterior degenerative spondylolisthesis at L3-4 and L4-5. Patient's failed conservative management and presents now for 2 level lumbar decompression infusion in hopes of improving her symptoms.  Past Medical History  Diagnosis Date  . Neuropathy     due to diabetes  . Ulcer     on xray, remotely  . Diabetes mellitus     II  . Hypertension   . Obesity   . Cancer 2011    Papillary Thyroid cancer/Iodine Radiation treatments  . Anemia     chronic  . GERD (gastroesophageal reflux disease)   . Back pain   . Hypothyroidism     h/o thyroid cancer- 2011  . Anxiety     seen at [facility], seen q 2-3 weeks     . Asthma     seasonal allergies   . Neuromuscular disorder     lumbar stenosis   . Arthritis     both knees     Past Surgical History  Procedure Laterality Date  . Cesarean section    . Tubal ligation    . Tonsilectomy, adenoidectomy, bilateral myringotomy and tubes  1987  . Carpal tunnel release      4 total, bilaterally  . Rotator cuff repair      bilaterally  . Total thyroidectomy  2011  . Elbow surgery      bilaterally  . Colonoscopy with esophagogastroduodenoscopy (egd) N/A 12/08/2012    Procedure: COLONOSCOPY WITH ESOPHAGOGASTRODUODENOSCOPY (EGD);  Surgeon: West Bali, MD;  Location: [facility];  Service: Endoscopy;  Laterality: N/A;  9:45  . Givens capsule study N/A 12/31/2012    Procedure: GIVENS CAPSULE STUDY;  Surgeon: West Bali, MD;  Location: [facility];  Service: Endoscopy;  Laterality: N/A;  7:30  . Enteroscopy N/A 01/16/2013    Procedure: ENTEROSCOPY;  Surgeon: West Bali, MD;   Location: [facility];  Service: Endoscopy;  Laterality: N/A;  2:00  . Esophageal biopsy N/A 01/16/2013    Procedure: BIOPSY;  Surgeon: West Bali, MD;  Location: [facility];  Service: Endoscopy;  Laterality: N/A;  SMALL BOWEL ULCERS  . Cholecystectomy  1990's    Family History  Problem Relation Age of Onset  . Heart disease Mother   . Cancer Father     lung, age 57  . Cancer Brother     colon, age 55  . Heart disease Daughter 32    Heart Attack   . Hypertension Daughter   . Other Brother     agent orange, age 67, cancer   Social History:  reports that she has never smoked. She has never used smokeless tobacco. She reports that she does not drink alcohol or use illicit drugs.  Allergies:  Allergies  Allergen Reactions  . Grapefruit Oil Anaphylaxis    Grape fruit causes Anaphylaxis    Medications Prior to Admission  Medication Sig Dispense Refill  . albuterol (PROVENTIL HFA;VENTOLIN HFA) 108 (90 BASE) MCG/ACT inhaler Inhale 2 puffs into the lungs every 6 (six) hours as needed for wheezing.      Marland Kitchen FLUoxetine (PROZAC) 40 MG capsule Take 40 mg by mouth daily before breakfast.      .  fluticasone (FLONASE) 50 MCG/ACT nasal spray Place 2 sprays into the nose daily.      . furosemide (LASIX) 20 MG tablet Take 20 mg by mouth daily before breakfast.      . gabapentin (NEURONTIN) 300 MG capsule Take 300 mg by mouth 2 (two) times daily.      Marland Kitchen levothyroxine (SYNTHROID, LEVOTHROID) 200 MCG tablet Take 200 mcg by mouth daily before breakfast. Pt has to have brand name      . lisinopril (PRINIVIL,ZESTRIL) 40 MG tablet TAKE 1 TABLET BY MOUTH DAILY  30 tablet  2  . metFORMIN (GLUCOPHAGE) 1000 MG tablet Take 1,000 mg by mouth 2 (two) times daily with a meal.      . naproxen (NAPROSYN) 500 MG tablet Take 500 mg by mouth 2 (two) times daily with a meal.      . omeprazole (PRILOSEC) 40 MG capsule Take 40 mg by mouth every morning.      Marland Kitchen oxyCODONE-acetaminophen (PERCOCET/ROXICET) 5-325 MG  per tablet Take 1 tablet by mouth every 4 (four) hours as needed for pain.      . simvastatin (ZOCOR) 40 MG tablet Take 1 tablet (40 mg total) by mouth daily.  30 tablet  6  . zolpidem (AMBIEN) 5 MG tablet Take 5 mg by mouth at bedtime as needed for sleep (sleep).      . cyclobenzaprine (FLEXERIL) 10 MG tablet Take 1 tablet (10 mg total) by mouth every 8 (eight) hours as needed for muscle spasms.  45 tablet  1  . ferrous sulfate 325 (65 FE) MG EC tablet Take 325 mg by mouth daily with breakfast.        Results for orders placed during the hospital encounter of 04/07/13 (from the past 48 hour(s))  GLUCOSE, CAPILLARY     Status: Abnormal   Collection Time    04/07/13  6:23 AM      Result Value Range   Glucose-Capillary 107 (*) 70 - 99 mg/dL   No results found.  Review of Systems  Constitutional: Negative.   HENT: Negative.   Eyes: Negative.   Respiratory: Negative.   Cardiovascular: Negative.   Gastrointestinal: Negative.   Genitourinary: Negative.   Musculoskeletal: Negative.   Skin: Negative.   Neurological: Negative.   Endo/Heme/Allergies: Negative.   Psychiatric/Behavioral: Negative.     Blood pressure 183/85, pulse 55, temperature 97.2 F (36.2 C), temperature source Oral, resp. rate 18, last menstrual period 09/07/2011, SpO2 100.00%. Physical Exam  Constitutional: She is oriented to person, place, and time. She appears well-developed and well-nourished.  HENT:  Head: Normocephalic and atraumatic.  Right Ear: External ear normal.  Left Ear: External ear normal.  Nose: Nose normal.  Mouth/Throat: Oropharynx is clear and moist.  Eyes: Conjunctivae and EOM are normal. Pupils are equal, round, and reactive to light. Right eye exhibits no discharge. Left eye exhibits no discharge.  Neck: Normal range of motion. Neck supple. No tracheal deviation present. No thyromegaly present.  Cardiovascular: Normal rate, regular rhythm, normal heart sounds and intact distal pulses.  Exam  reveals no friction rub.   No murmur heard. Respiratory: Effort normal and breath sounds normal. No respiratory distress. She has no wheezes.  GI: Soft. Bowel sounds are normal. She exhibits no distension. There is no tenderness.  Musculoskeletal: Normal range of motion. She exhibits no edema and no tenderness.  Neurological: She is alert and oriented to person, place, and time. She has normal reflexes. She displays normal reflexes. No cranial nerve deficit.  She exhibits normal muscle tone. Coordination normal.  Skin: Skin is warm and dry. No rash noted. No erythema. No pallor.  Psychiatric: She has a normal mood and affect. Her behavior is normal. Judgment and thought content normal.     Assessment/Plan L3-4 and L4-5 grade 1 degenerative spondylolisthesis with stenosis and neurogenic claudication. Plan L3-4 and L4-5 decompressive laminectomy with foraminotomies followed by posterior lumbar interbody fusion utilizing tangent interbody allograft wedge Telamon interbody peek cage and local autograft. This will be coupled with posterior lateral arthrodesis utilizing segmental pedicle screw instrumentation and local autograft. Risks and benefits have been explained. Patient wishes to proceed.  Suhas Estis A 04/07/2013, 7:38 AM

## 2013-04-07 NOTE — Brief Op Note (Signed)
04/07/2013  11:45 AM  PATIENT:  Autumn Carson  57 y.o. female  PRE-OPERATIVE DIAGNOSIS:  stenosis  POST-OPERATIVE DIAGNOSIS:  stenosis  PROCEDURE:  Procedure(s): POSTERIOR LUMBAR INTERBODY FUSION LUMBAR THREE-TO LUMBAR FIVE POSTERIOLATERAL ARTHRODESIS PEDICLE SCREWS (N/A)  SURGEON:  Surgeon(s) and Role:    * Temple Pacini, MD - Primary    * Barnett Abu, MD - Assisting  PHYSICIAN ASSISTANT:   ASSISTANTS:  Elsner   ANESTHESIA:   general  EBL:  Total I/O In: 1000 [I.V.:1000] Out: 575 [Urine:200; Blood:375]  BLOOD ADMINISTERED:none  DRAINS: (Medium) Hemovact drain(s) in the Epidural space with  Suction Open   LOCAL MEDICATIONS USED:  MARCAINE     SPECIMEN:  No Specimen  DISPOSITION OF SPECIMEN:  N/A  COUNTS:  YES  TOURNIQUET:  * No tourniquets in log *  DICTATION: .Dragon Dictation  PLAN OF CARE: Admit to inpatient   PATIENT DISPOSITION:  PACU - hemodynamically stable.   Delay start of Pharmacological VTE agent (>24hrs) due to surgical blood loss or risk of bleeding: yes

## 2013-04-07 NOTE — Preoperative (Signed)
Beta Blockers   Reason not to administer Beta Blockers:Not Applicable 

## 2013-04-07 NOTE — Progress Notes (Signed)
Pt OOB to chair with 2 assist and walker and brace. Tolerated well

## 2013-04-07 NOTE — Op Note (Signed)
Date of procedure: 04/07/2013  Date of dictation: Same  Service: Neurosurgery  Preoperative diagnosis: L3-4, L4-5 grade 1 degenerative spondylolisthesis with stenosis and neurogenic claudication affecting bilateral L3-L4 and L5 nerve roots.  Postoperative diagnosis: Same  Procedure Name: L3-4, L4-5 decompressive laminectomies with bilateral L3, L4, L5 decompressive foraminotomies in order to relieve pressure on the symptomatic nerve roots; more than would be required for simple interbody fusion alone.  L3-4, L4-5 posterior lumbar interbody fusion utilizing tangent interbody allograft wedge Telamon interbody peek cage and local autograft.  L3-4-5 posterior lateral arthrodesis utilizing segmental pedicle screw instrumentation and local autograft  Surgeon:Neel Buffone A.Fenton Candee, M.D.  Asst. Surgeon: Danielle Dess  Anesthesia: General  Indication: 57 year old female with severe back and bilateral lower extremity pain left greater than right consistent with a mixed lumbar radiculopathy and with features consistent with neurogenic claudication. Workup demonstrates evidence of grade 1 degenerative spondylolisthesis at L3-4 L4-5 with severe stenosis at L3-4 and moderately severe stenosis at L4-5. Patient's failed conservative management and presents now for decompression and fusion surgery in hopes of improving her symptoms.  Operative note: After induction of anesthesia, patient positioned prone onto Wilson frame and appropriately padded. Lumbar region prepped and draped. Incision made and dissection performed bilaterally exposing the lamina facet joints and transverse processes of L3-L4 and L5. Retractor placed. Fluoroscopy used. Level confirmed. Decompressive laminectomies then performed using Leksell orders Veleta Miners is a high-speed drill to remove the entire lamina of L3 the entire lamina of L4 and the superior aspect of lamina of L5. Complete inferior facetectomies of L3 and L4 for performed bilaterally. Superior  facetectomies of L4 and L5 were performed bilaterally. All bone removed was cleaned in use and later autograft. Ligament flavum was elevated and resected piecemeal fashion. Wide decompressive foraminotomies were then performed on course exiting L3-L4 and L5 nerve roots bilaterally. Bilateral discectomies and performed at L3-4 and L4-5. The space and distraction up to 10 mm on the left side. Thecal sac and nerve were protected on the right side. The spaces and reamed and then cut with 10 mm tangent his wrist. Soft tissue was removed and interspace. A 10 x 22 mm Telamon cage packed with morselized autograft and packed into place and recessed proximally 1 mm posterior cortical margin of L3. Distractor was removed patient's left side. Thecal sac and nerve roots were protected on the left side. The spaces was again reamed and then cut with 10 mm tangent instruments. Soft tissue was removed and interspace. The spaces further curettage. Morselize autograft was impacted interspace for later fusion. A 10 x 26 mm tangent wedges and packed into place and recessed roughly 1 mm from the posterior cortical margin at L3. Procedure then repeated at L4-5 again without complication and again using 10 mm implant and local autograft. Pedicles of L3 L4-L5 were notified using surface landmarks and intraoperative fluoroscopy or superficial bone overlying the pedicle was then removed. Pedicles and probed using a pedicle awl. Each pedicle awl track was tapped with a screw tap. Each screw tap old was probed and found to be solidly within bone. 6.75 mm radius brand screws were then placed bilaterally at L3-L4 and L5. Transverse processes of L3-4 and 5 were then decorticated using high-speed drill. Morselized autograft was packed posterior lateral greater fusion. Short segment titanium rod was then placed over the screw heads at L3-4 and 5. Locking caps and placed over the screw heads. Locking caps and engaged with the construct under  compression. Transverse connector was placed. Medium Hemovac drain was  left at per space. Wounds and close in layers with Vicryl sutures. Steri-Strips triggers were applied. Intraoperative x-rays were taken which demonstrated good position the bone graft and hardware at the proper upper level with normal lamina spine. The patient tolerated suture well and she returns to the recovery room postop.

## 2013-04-07 NOTE — Anesthesia Postprocedure Evaluation (Signed)
Anesthesia Post Note  Patient: Autumn Carson  Procedure(s) Performed: Procedure(s) (LRB): POSTERIOR LUMBAR INTERBODY FUSION LUMBAR THREE-TO LUMBAR FIVE POSTERIOLATERAL ARTHRODESIS PEDICLE SCREWS (N/A)  Anesthesia type: General  Patient location: PACU  Post pain: Pain level controlled  Post assessment: Patient's Cardiovascular Status Stable  Last Vitals:  Filed Vitals:   04/07/13 1236  BP: 129/59  Pulse: 65  Temp: 36.3 C  Resp: 14    Post vital signs: Reviewed and stable  Level of consciousness: alert  Complications: No apparent anesthesia complications

## 2013-04-07 NOTE — Progress Notes (Signed)
Emptied 200 cc from hemovac at 4:30 pm then 200 cc bloody drainage again at 6:30pm. Called MD on call Danielle Dess). No orders at this time

## 2013-04-07 NOTE — Progress Notes (Signed)
Removed foley at 1850, then ambulated patient to bathroom where she voided. Tolerated well

## 2013-04-07 NOTE — Progress Notes (Signed)
UR COMPLETED  

## 2013-04-07 NOTE — Progress Notes (Signed)
Pt c/o "rapid heart rate".  Pulse 84 and regular.  Denies shortness of breath, pain, or and discomfort.  Will continue to monitor for changes in condition.

## 2013-04-07 NOTE — Anesthesia Preprocedure Evaluation (Addendum)
Anesthesia Evaluation  Patient identified by MRN, date of birth, ID band Patient awake    Reviewed: Allergy & Precautions, H&P , NPO status , Patient's Chart, lab work & pertinent test results, reviewed documented beta blocker date and time   Airway Mallampati: II TM Distance: >3 FB Neck ROM: full    Dental   Pulmonary asthma ,  breath sounds clear to auscultation        Cardiovascular hypertension, Pt. on medications Rhythm:regular     Neuro/Psych  Headaches, PSYCHIATRIC DISORDERS Anxiety Depression  Neuromuscular disease    GI/Hepatic Neg liver ROS, GERD-  Medicated and Controlled,  Endo/Other  diabetes, Oral Hypoglycemic AgentsHypothyroidism Morbid obesity  Renal/GU negative Renal ROS  negative genitourinary   Musculoskeletal   Abdominal   Peds  Hematology  (+) anemia ,   Anesthesia Other Findings See surgeon's H&P   Reproductive/Obstetrics negative OB ROS                           Anesthesia Physical Anesthesia Plan  ASA: III  Anesthesia Plan: General   Post-op Pain Management:    Induction: Intravenous  Airway Management Planned: Oral ETT  Additional Equipment:   Intra-op Plan:   Post-operative Plan: Extubation in OR  Informed Consent: I have reviewed the patients History and Physical, chart, labs and discussed the procedure including the risks, benefits and alternatives for the proposed anesthesia with the patient or authorized representative who has indicated his/her understanding and acceptance.   Dental Advisory Given  Plan Discussed with: CRNA and Surgeon  Anesthesia Plan Comments:         Anesthesia Quick Evaluation

## 2013-04-07 NOTE — Transfer of Care (Signed)
Immediate Anesthesia Transfer of Care Note  Patient: Autumn Carson  Procedure(s) Performed: Procedure(s): POSTERIOR LUMBAR INTERBODY FUSION LUMBAR THREE-TO LUMBAR FIVE POSTERIOLATERAL ARTHRODESIS PEDICLE SCREWS (N/A)  Patient Location: PACU  Anesthesia Type:General  Level of Consciousness: awake, alert  and oriented  Airway & Oxygen Therapy: Patient Spontanous Breathing and Patient connected to nasal cannula oxygen  Post-op Assessment: Report given to PACU RN and Post -op Vital signs reviewed and stable  Post vital signs: Reviewed and stable  Complications: No apparent anesthesia complications

## 2013-04-08 LAB — GLUCOSE, CAPILLARY
Glucose-Capillary: 98 mg/dL (ref 70–99)
Glucose-Capillary: 93 mg/dL (ref 70–99)

## 2013-04-08 MED FILL — Sodium Chloride IV Soln 0.9%: INTRAVENOUS | Qty: 1000 | Status: AC

## 2013-04-08 MED FILL — Heparin Sodium (Porcine) Inj 1000 Unit/ML: INTRAMUSCULAR | Qty: 30 | Status: AC

## 2013-04-08 NOTE — Progress Notes (Signed)
Occupational Therapy Treatment Patient Details Name: Autumn Carson MRN: [id number] DOB: 1955/12/13 Today's Date: 04/08/2013 Time: 4098-1191 OT Time Calculation (min): 44 min  OT Assessment / Plan / Recommendation  History of present illness 57 y.o. s/p POSTERIOR LUMBAR INTERBODY FUSION LUMBAR THREE-TO LUMBAR FIVE POSTERIOLATERAL ARTHRODESIS PEDICLE SCREWS (N/A)   OT comments  Pt progressing towards goals. Practiced simulated tub transfer, toileting, grooming at sink, bed mobility, LB dressing, and donning/doffing brace.   Follow Up Recommendations  Supervision/Assistance - 24 hour;No OT follow up    Barriers to Discharge       Equipment Recommendations  None recommended by OT    Recommendations for Other Services    Frequency Min 2X/week   Progress towards OT Goals Progress towards OT goals: Progressing toward goals  Plan Discharge plan remains appropriate    Precautions / Restrictions Precautions Precautions: Back;Fall Precaution Booklet Issued: No Precaution Comments: Reviewed precautions with pt. Required Braces or Orthoses: Spinal Brace Spinal Brace: Applied in sitting position;Lumbar corset Restrictions Weight Bearing Restrictions: No   Pertinent Vitals/Pain Pain 8/10. Repositioned.     ADL  Grooming: Performed;Wash/dry hands;Supervision/safety Where Assessed - Grooming: Supported standing Upper Body Dressing: Set up;Supervision/safety;Other (comment) (back brace) Lower Body Dressing: Performed;Min guard Where Assessed - Lower Body Dressing: Supported sit to Pharmacist, hospital: Supervision/safety Statistician Method: Sit to Barista: Raised toilet seat with arms (or 3-in-1 over toilet) Toileting - Clothing Manipulation and Hygiene: Supervision/safety Where Assessed - Engineer, mining and Hygiene: Sit to stand from 3-in-1 or toilet Tub/Shower Transfer: Simulated;Min guard Tub/Shower Transfer Method:  Science writer: Other (comment) (3 in 1) Equipment Used: Gait belt;Back brace;Rolling walker;Reacher;Long-handled sponge Transfers/Ambulation Related to ADLs: Supervision for ambulation and sit <> stand transfers. Min guard for shower transfer. ADL Comments: OT discussed options for tub/shower transfer. Pt has both walk in and tub/shower, but walker will not fit in shower along with a chair. OT recommended pt not to use walk in shower since she can not fit a walker and chair in shower. OT demonstrated and had pt practice simulated tub/shower transfer with 3 in 1. OT also showed pt technique with a chair without armrests. Pt explained she will sponge bathe for a little while, but agreed it was beneficial for her to know how to do. Pt donned panties with reacher and performed toileting tasks. Cues for precautions during hygiene and explained to use tongs at home. Practiced donning/doffing brace as well as bed mobility.     OT Diagnosis:    OT Problem List:   OT Treatment Interventions:     OT Goals(current goals can now be found in the care plan section) Acute Rehab OT Goals Patient Stated Goal: to go home OT Goal Formulation: With patient Time For Goal Achievement: 04/15/13 Potential to Achieve Goals: Good ADL Goals Pt Will Perform Grooming: with modified independence;standing Pt Will Perform Lower Body Bathing: with modified independence;sit to/from stand;with adaptive equipment Pt Will Perform Lower Body Dressing: with modified independence;with adaptive equipment;sit to/from stand Pt Will Transfer to Toilet: with modified independence;ambulating (3 in 1 over commode) Pt Will Perform Toileting - Clothing Manipulation and hygiene: with modified independence;with adaptive equipment;sit to/from stand Additional ADL Goal #1: Pt will independently verbalize and demonstrate 3/3 back precautions. Additional ADL Goal #2: Pt will independently don/doff back brace while  maintaining back precautions.   Visit Information  Last OT Received On: 04/08/13 Assistance Needed: +1 History of Present Illness: 57 y.o. s/p POSTERIOR LUMBAR INTERBODY FUSION  LUMBAR THREE-TO LUMBAR FIVE POSTERIOLATERAL ARTHRODESIS PEDICLE SCREWS (N/A)    Subjective Data      Prior Functioning  Home Living Family/patient expects to be discharged to:: Private residence Living Arrangements: Spouse/significant other;Children Available Help at Discharge: Family;Available 24 hours/day Type of Home: Mobile home Home Access: Stairs to enter Entrance Stairs-Number of Steps: 4 Entrance Stairs-Rails: Left Home Layout: One level Home Equipment: Walker - 2 wheels;Cane - single point;Bedside commode;Adaptive equipment;Other (comment) Adaptive Equipment: Reacher;Sock aid;Long-handled shoe horn Prior Function Level of Independence: Independent with assistive device(s) Comments: used cane Communication Communication: No difficulties    Cognition  Cognition Arousal/Alertness: Awake/alert Behavior During Therapy: WFL for tasks assessed/performed Overall Cognitive Status: Within Functional Limits for tasks assessed    Mobility  Bed Mobility Bed Mobility: Left Sidelying to Sit;Rolling Right;Rolling Left;Sitting - Scoot to Edge of Bed;Sit to Sidelying Left Rolling Right: 4: Min assist Rolling Left: 4: Min guard Left Sidelying to Sit: 4: Min guard Sitting - Scoot to [place] of Bed: 5: Supervision Sit to Sidelying Left: 4: Min assist Details for Bed Mobility Assistance: Cues for precautions and technique. A to keep trunk and body in alignment and also to lift leg onto bed.  Transfers Transfers: Sit to Stand;Stand to Sit Sit to Stand: 5: Supervision;With upper extremity assist;From chair/3-in-1;From bed Stand to Sit: 5: Supervision;With upper extremity assist;To chair/3-in-1;To elevated surface;To bed Details for Transfer Assistance: cues for precautions and hand placement    Exercises       Balance     End of Session OT - End of Session Equipment Utilized During Treatment: Gait belt;Rolling walker;Back brace Activity Tolerance: Patient tolerated treatment well Patient left: in chair;with call bell/phone within reach  GO     Earlie Raveling OTR/L [phone] 04/08/2013, 5:01 PM

## 2013-04-08 NOTE — Evaluation (Signed)
Physical Therapy Evaluation Patient Details Name: Autumn Carson MRN: [id number] DOB: 05/23/1956 Today's Date: 04/08/2013 Time: 4098-1191 PT Time Calculation (min): 19 min  PT Assessment / Plan / Recommendation History of Present Illness  57 y.o. s/p POSTERIOR LUMBAR INTERBODY FUSION LUMBAR THREE-TO LUMBAR FIVE POSTERIOLATERAL ARTHRODESIS PEDICLE SCREWS (N/A)  Clinical Impression  Patient is s/p surgery resulting in the deficits listed below (see PT Problem List). Patient will benefit from skilled PT to increase their independence and safety with mobility (while adhering to their precautions) to allow discharge home.      PT Assessment  Patient needs continued PT services    Follow Up Recommendations  No PT follow up    Does the patient have the potential to tolerate intense rehabilitation      Barriers to Discharge        Equipment Recommendations  None recommended by PT    Recommendations for Other Services     Frequency Min 5X/week    Precautions / Restrictions Precautions Precautions: Back;Fall Precaution Comments: Reviewed precautions with pt. Required Braces or Orthoses: Spinal Brace Spinal Brace: Applied in sitting position;Lumbar corset   Pertinent Vitals/Pain Reports minimal back pain, no need for pain meds      Mobility  Bed Mobility Bed Mobility: Rolling Left;Left Sidelying to Sit Rolling Left: 4: Min assist Left Sidelying to Sit: 4: Min guard Details for Bed Mobility Assistance: min tactile cues for follow through to roll without rail, cues for technique  Transfers Transfers: Sit to Stand;Stand to Sit Sit to Stand: 4: Min guard;With upper extremity assist;From bed;From chair/3-in-1 Stand to Sit: 4: Min guard;With upper extremity assist;To chair/3-in-1 Details for Transfer Assistance: cues for hand placement Ambulation/Gait Ambulation/Gait Assistance: 4: Min guard Ambulation Distance (Feet): 200 Feet Assistive device: Rolling  walker Ambulation/Gait Assistance Details: cues for tall posture and safety with RW  Gait Pattern: Step-through pattern Gait velocity: decreased    Exercises     PT Diagnosis: Difficulty walking;Acute pain  PT Problem List: Decreased activity tolerance;Decreased knowledge of precautions;Pain;Decreased knowledge of use of DME;Decreased mobility PT Treatment Interventions: DME instruction;Gait training;Functional mobility training;Therapeutic activities;Patient/family education;Stair training     PT Goals(Current goals can be found in the care plan section) Acute Rehab PT Goals Patient Stated Goal: to go home PT Goal Formulation: With patient Time For Goal Achievement: 04/15/13 Potential to Achieve Goals: Good  Visit Information  Last PT Received On: 04/08/13 Assistance Needed: +1 History of Present Illness: 57 y.o. s/p POSTERIOR LUMBAR INTERBODY FUSION LUMBAR THREE-TO LUMBAR FIVE POSTERIOLATERAL ARTHRODESIS PEDICLE SCREWS (N/A)       Prior Functioning  Home Living Family/patient expects to be discharged to:: Private residence Living Arrangements: Spouse/significant other;Children Available Help at Discharge: Family;Available 24 hours/day Type of Home: Mobile home Home Access: Stairs to enter Entrance Stairs-Number of Steps: 4 Entrance Stairs-Rails: Left Home Layout: One level Home Equipment: Walker - 2 wheels;Cane - single point;Bedside commode;Adaptive equipment;Other (comment) Adaptive Equipment: Reacher;Sock aid;Long-handled shoe horn Prior Function Level of Independence: Independent with assistive device(s) Comments: used cane Communication Communication: No difficulties    Cognition  Cognition Arousal/Alertness: Awake/alert Behavior During Therapy: WFL for tasks assessed/performed Overall Cognitive Status: Within Functional Limits for tasks assessed    Extremity/Trunk Assessment Lower Extremity Assessment Lower Extremity Assessment: Overall WFL for tasks  assessed   Balance    End of Session PT - End of Session Equipment Utilized During Treatment: Gait belt Activity Tolerance: Patient tolerated treatment well Patient left: in chair;with call bell/phone within reach Nurse Communication:  Mobility status  GP     Ludger Nutting 04/08/2013, 4:15 PM

## 2013-04-08 NOTE — Progress Notes (Signed)
Postop day 1. Overall doing very well. Preoperative pain much improved. No lower extremity symptoms. Patient up ambulating today with rather minimal assistance.  Afebrile. Vitals are stable. Urine output good. Drain output high but slowing down. Awake and alert. Oriented and appropriate. Motor and sensory exam intact. Dressing clean and dry.  Doing well following 2 level lumbar decompression and fusion. Continue efforts at mobilization. Possible discharge home tomorrow.

## 2013-04-08 NOTE — Evaluation (Addendum)
Occupational Therapy Evaluation Patient Details Name: Autumn Carson MRN: [id number] DOB: [birth year] Today's Date: 04/08/2013 Time: 4098-1191 OT Time Calculation (min): 27 min  OT Assessment / Plan / Recommendation History of present illness 57 y.o. s/p POSTERIOR LUMBAR INTERBODY FUSION LUMBAR THREE-TO LUMBAR FIVE POSTERIOLATERAL ARTHRODESIS PEDICLE SCREWS (N/A)   Clinical Impression   Pt presents with below problem list. Pt independent with ADLs and used cane, PTA. Pt will benefit from acute OT to increase independence prior to d/c. Pt states she has 24/7 assist at home.     OT Assessment  Patient needs continued OT Services    Follow Up Recommendations  Supervision/Assistance - 24 hour;No OT follow up    Barriers to Discharge      Equipment Recommendations  Other (comment) (tbd)    Recommendations for Other Services    Frequency  Min 2X/week    Precautions / Restrictions Precautions Precautions: Back;Fall Precaution Booklet Issued: No Precaution Comments: Reviewed precautions with pt. Required Braces or Orthoses: Spinal Brace Spinal Brace: Applied in sitting position;Lumbar corset Restrictions Weight Bearing Restrictions: No   Pertinent Vitals/Pain Pain 3-4/10 in back. Repositioned.     ADL  Eating/Feeding: Independent Where Assessed - Eating/Feeding: Chair Grooming: Performed;Teeth care;Supervision/safety;Set up;Wash/dry hands;Denture care Where Assessed - Grooming: Supported standing Upper Body Bathing: Set up;Supervision/safety Where Assessed - Upper Body Bathing: Supported sitting Lower Body Bathing: Maximal assistance Where Assessed - Lower Body Bathing: Supported sit to stand Upper Body Dressing: Minimal assistance;Other (comment) (back brace) Where Assessed - Upper Body Dressing: Unsupported sitting (OT assisted in tightening it while standing) Lower Body Dressing: Maximal assistance Where Assessed - Lower Body Dressing: Supported sit to  stand Toilet Transfer: Hydrographic surveyor Method: Sit to Barista: Raised toilet seat with arms (or 3-in-1 over toilet) Toileting - Clothing Manipulation and Hygiene: Maximal assistance;Minimal assistance (Min A-clothing and Max A-hygiene) Where Assessed - Toileting Clothing Manipulation and Hygiene: Sit to stand from 3-in-1 or toilet Tub/Shower Transfer Method: Not assessed Equipment Used: Gait belt;Back brace;Rolling walker Transfers/Ambulation Related to ADLs: Min guard for ambulation and transfers. ADL Comments: Educated on log roll technique and cues to maintain precautions with donning brace (OT assisted with tightening it-pt may be able to do this). Pt ambulated to bathroom. OT educated on toilet aid to assist with hygiene. Pt able to reach in front minimally by bending knees- but she does not think she can clean thoroughly-cues for precautions. OT assisted with moving gown. OT educated on using two cups for teeth/denture care to avoid breaking precautions. OT gave cues for precautions during grooming task at sink.  OT educated on standing in front of chair/bed with walker in front when pulling up clothing and having someone with her. Also educated on dressing technique and using bag on walker to carry items. Discussed use of reacher for LB clothing. Daughter listening when discussing shower transfer and they do not think walker will fit through door and unsure if walker and chair will fit if pt steps in sideways. OT said that pt may have to sponge bathe.  Discussed use of reacher for LB clothing. Daughter listening when discussing shower transfer and they do not think walker will fit through door and unsure if walker and chair will fit if pt steps in sideways. OT said that pt may have to sponge bathe.    OT Diagnosis: Acute pain  OT Problem List: Decreased range of motion;Decreased knowledge of use of DME or AE;Decreased knowledge of  precautions;Pain;Obesity;Decreased activity tolerance  OT Treatment Interventions: Self-care/ADL training;DME and/or AE instruction;Therapeutic activities;Patient/family education;Balance training   OT Goals(Current goals can be found in the care plan section) Acute Rehab OT Goals Patient Stated Goal: to go home OT Goal Formulation: With patient Time For Goal Achievement: 04/15/13 Potential to Achieve Goals: Good ADL Goals Pt Will Perform Grooming: with modified independence;standing Pt Will Perform Lower Body Bathing: with modified independence;sit to/from stand;with adaptive equipment Pt Will Perform Lower Body Dressing: with modified independence;with adaptive equipment;sit to/from stand Pt Will Transfer to Toilet: with modified independence;ambulating (3 in 1 over commode) Pt Will Perform Toileting - Clothing Manipulation and hygiene: with modified independence;with adaptive equipment;sit to/from stand Additional ADL Goal #1: Pt will independently verbalize and demonstrate 3/3 back precautions. Additional ADL Goal #2: Pt will independently don/doff back brace while maintaining back precautions.   Visit Information  Last OT Received On: 04/08/13 Assistance Needed: +1 History of Present Illness: 57 y.o. s/p POSTERIOR LUMBAR INTERBODY FUSION LUMBAR THREE-TO LUMBAR FIVE POSTERIOLATERAL ARTHRODESIS PEDICLE SCREWS (N/A)       Prior Functioning     Home Living Family/patient expects to be discharged to:: Private residence Living Arrangements: Spouse/significant other;Children Available Help at Discharge: Family;Available 24 hours/day Type of Home: Mobile home Home Access: Stairs to enter Entrance Stairs-Number of Steps: 4 Entrance Stairs-Rails: Left Home Layout: One level Home Equipment: Walker - 2 wheels;Cane - single point;Bedside commode;Adaptive equipment;Other (comment) (may have shower seat) Adaptive Equipment: Reacher;Sock aid;Long-handled shoe horn Prior Function Level of  Independence: Independent with assistive device(s) Comments: used cane Communication Communication: No difficulties Dominant Hand: Right         Vision/Perception Vision - History Baseline Vision: Wears glasses all the time   Cognition  Cognition Arousal/Alertness: Awake/alert Behavior During Therapy: WFL for tasks assessed/performed Overall Cognitive Status: Within Functional Limits for tasks assessed    Extremity/Trunk Assessment Upper Extremity Assessment Upper Extremity Assessment: Overall WFL for tasks assessed Lower Extremity Assessment Lower Extremity Assessment: Defer to PT evaluation     Mobility Bed Mobility Bed Mobility: Rolling Left;Left Sidelying to Sit Rolling Left: 4: Min assist Left Sidelying to Sit: 4: Min guard;HOB flat Details for Bed Mobility Assistance: Min A to roll and keep body in alignment. Educated on technique and gave cues for precautions. Transfers Transfers: Sit to Stand;Stand to Sit Sit to Stand: 4: Min guard;With upper extremity assist;From bed;From chair/3-in-1 Stand to Sit: 4: Min guard;With upper extremity assist;To chair/3-in-1 Details for Transfer Assistance: Min guard for safety. Cues for technique, hand placement, and precautions.     Exercise     Balance     End of Session OT - End of Session Equipment Utilized During Treatment: Gait belt;Rolling walker;Back brace Activity Tolerance: Patient tolerated treatment well Patient left: in chair;with call bell/phone within reach;with family/visitor present Nurse Communication: Other (comment);Mobility status (feeling nauseous)  GO      Earlie Raveling OTR/L [phone] 04/08/2013, 10:15 AM

## 2013-04-08 NOTE — Progress Notes (Signed)
Patient ambulated Half hallway with RN. Tolerated very well with walker. Hemovac drained 340cc.

## 2013-04-09 LAB — GLUCOSE, CAPILLARY: Glucose-Capillary: 94 mg/dL (ref 70–99)

## 2013-04-09 MED ORDER — DIAZEPAM 5 MG PO TABS: 5.0000 mg | ORAL_TABLET | Freq: Four times a day (QID) | ORAL | Status: DC | PRN

## 2013-04-09 MED ORDER — OXYCODONE-ACETAMINOPHEN 5-325 MG PO TABS: 1.0000 | ORAL_TABLET | ORAL | Status: DC | PRN

## 2013-04-09 NOTE — Progress Notes (Signed)
Occupational Therapy Treatment Patient Details Name: Autumn Carson MRN: [id number] DOB: [birth year] Today's Date: 04/09/2013 Time: 2841-3244 OT Time Calculation (min): 12 min  OT Assessment / Plan / Recommendation  History of present illness 57 y.o. s/p POSTERIOR LUMBAR INTERBODY FUSION LUMBAR THREE-TO LUMBAR FIVE POSTERIOLATERAL ARTHRODESIS PEDICLE SCREWS (N/A)   OT comments  Pt is at adequate level for d/c home today. All OT education complete and no further questions at this time  Follow Up Recommendations  Supervision/Assistance - 24 hour;No OT follow up    Barriers to Discharge       Equipment Recommendations  None recommended by OT    Recommendations for Other Services    Frequency Min 2X/week   Progress towards OT Goals Progress towards OT goals: Goals met/education completed, patient discharged from OT  Plan Discharge plan remains appropriate    Precautions / Restrictions Precautions Precautions: Back Required Braces or Orthoses: Spinal Brace Spinal Brace: Lumbar corset;Applied in sitting position   Pertinent Vitals/Pain No report of pain Pt states "my husband is on his way and I am ready"    ADL  Upper Body Dressing: Independent Where Assessed - Upper Body Dressing: Supported sitting Lower Body Dressing: Modified independent Where Assessed - Lower Body Dressing: Unsupported sit to stand Toilet Transfer: Modified independent Toilet Transfer Method: Sit to Barista: Raised toilet seat with arms (or 3-in-1 over toilet) Toileting - Clothing Manipulation and Hygiene: Modified independent Where Assessed - Toileting Clothing Manipulation and Hygiene: Sit to stand from 3-in-1 or toilet Equipment Used: Back brace;Rolling walker ADL Comments: pt with pending d/c. Precautions reviewed and full dressing occurred during session. Pt with good return demo of precautions. Pt educated on brace wear schedule. Pt found in chair without brace on  arrival. Pt must have brace on except for bathing and supine position.    OT Diagnosis:    OT Problem List:   OT Treatment Interventions:     OT Goals(current goals can now be found in the care plan section) Acute Rehab OT Goals Patient Stated Goal: to go home OT Goal Formulation: With patient Time For Goal Achievement: 04/15/13 Potential to Achieve Goals: Good ADL Goals Pt Will Perform Grooming: with modified independence;standing Pt Will Perform Lower Body Bathing: with modified independence;sit to/from stand;with adaptive equipment Pt Will Perform Lower Body Dressing: with modified independence;with adaptive equipment;sit to/from stand Pt Will Transfer to Toilet: with modified independence;ambulating Pt Will Perform Toileting - Clothing Manipulation and hygiene: with modified independence;with adaptive equipment;sit to/from stand Additional ADL Goal #1: Pt will independently verbalize and demonstrate 3/3 back precautions. Additional ADL Goal #2: Pt will independently don/doff back brace while maintaining back precautions.   Visit Information  Last OT Received On: 04/09/13 Assistance Needed: +1 History of Present Illness: 57 y.o. s/p POSTERIOR LUMBAR INTERBODY FUSION LUMBAR THREE-TO LUMBAR FIVE POSTERIOLATERAL ARTHRODESIS PEDICLE SCREWS (N/A)    Subjective Data      Prior Functioning       Cognition  Cognition Arousal/Alertness: Awake/alert Behavior During Therapy: WFL for tasks assessed/performed Overall Cognitive Status: Within Functional Limits for tasks assessed    Mobility  Bed Mobility Bed Mobility: Not assessed Transfers Sit to Stand: 6: Modified independent (Device/Increase time) Stand to Sit: 6: Modified independent (Device/Increase time)    Exercises      Balance     End of Session OT - End of Session Activity Tolerance: Patient tolerated treatment well Patient left: in chair;with call bell/phone within reach Nurse Communication: Mobility  status;Precautions  GO     Boone Master B 04/09/2013, 1:37 PM  Pager: [phone]

## 2013-04-09 NOTE — Progress Notes (Signed)
Physical Therapy Treatment Patient Details Name: Autumn Carson MRN: [id number] DOB: [birth year] Today's Date: 04/09/2013 Time: 9629-5284 PT Time Calculation (min): 27 min  PT Assessment / Plan / Recommendation  History of Present Illness 57 y.o. s/p POSTERIOR LUMBAR INTERBODY FUSION LUMBAR THREE-TO LUMBAR FIVE POSTERIOLATERAL ARTHRODESIS PEDICLE SCREWS (N/A)   PT Comments   Patient is making good progress with PT.  From a mobility standpoint anticipate patient will be ready for DC home today. Pt has met all PT goals and is d/c from acute PT. Pt is safe to ambulate in the halls and in her room without assistance until d/c home.     Follow Up Recommendations  No PT follow up     Does the patient have the potential to tolerate intense rehabilitation     Barriers to Discharge        Equipment Recommendations  None recommended by PT    Recommendations for Other Services    Frequency     Progress towards PT Goals Progress towards PT goals: Goals met/education completed, patient discharged from PT  Plan Current plan remains appropriate    Precautions / Restrictions Precautions Precautions: Back Precaution Comments: pt able to recall 3/3 back precautions and also demonstrated with mobility without cues. Required Braces or Orthoses: Spinal Brace Spinal Brace: Lumbar corset;Applied in sitting position Restrictions Weight Bearing Restrictions: No   Pertinent Vitals/Pain     Mobility  Bed Mobility Bed Mobility: Sit to Supine Left Sidelying to Sit: 6: Modified independent (Device/Increase time) Sitting - Scoot to Edge of Bed: 6: Modified independent (Device/Increase time) Details for Bed Mobility Assistance: maintained back precautions without cues Transfers Transfers: Sit to Stand;Stand to Sit Sit to Stand: 6: Modified independent (Device/Increase time) Stand to Sit: 6: Modified independent (Device/Increase time) Ambulation/Gait Ambulation/Gait Assistance: 6: Modified  independent (Device/Increase time) Ambulation Distance (Feet): 300 Feet Assistive device: None Gait Pattern: Step-through pattern Gait velocity: decreased  General Gait Details: Pt was mod. Independent without an assistive device due to gait velocity. Pt is a little hesitate without the RW, but was able to safely ambulate on level surfaces without assistance. Stairs: Yes Stairs Assistance: 6: Modified independent (Device/Increase time) Stair Management Technique: One rail Right;Step to pattern Number of Stairs: 3    Exercises     PT Diagnosis:    PT Problem List:   PT Treatment Interventions:     PT Goals (current goals can now be found in the care plan section)    Visit Information  Last PT Received On: 04/09/13 History of Present Illness: 57 y.o. s/p POSTERIOR LUMBAR INTERBODY FUSION LUMBAR THREE-TO LUMBAR FIVE POSTERIOLATERAL ARTHRODESIS PEDICLE SCREWS (N/A)    Subjective Data      Cognition  Cognition Arousal/Alertness: Awake/alert Behavior During Therapy: WFL for tasks assessed/performed Overall Cognitive Status: Within Functional Limits for tasks assessed    Balance     End of Session PT - End of Session Equipment Utilized During Treatment: Gait belt;Back brace Activity Tolerance: Patient tolerated treatment well Patient left: in chair;with call bell/phone within reach Nurse Communication: Mobility status (Pt is safe to ambulate in the room without assistance)   GP     Greggory Stallion 04/09/2013, 9:05 AM

## 2013-04-09 NOTE — Discharge Summary (Signed)
Physician Discharge Summary  Patient ID: Autumn Carson MRN: [id number] DOB/AGE: 55-Jul-1957 24 y.o.  Admit date: 04/07/2013 Discharge date: 04/09/2013  Admission Diagnoses:  Discharge Diagnoses:  Principal Problem:   Spinal stenosis, lumbar region, with neurogenic claudication Active Problems:   DIABETES MELLITUS, TYPE II   HYPERTENSION   Morbid obesity   Discharged Condition: good  Hospital Course: Patient admitted to the hospital where she underwent uncomplicated 2 level lumbar decompression and fusion. Postoperatively she is doing very well. Back and leg pain much improved. She is mobilizing without difficulty. She is happy with her progress and feels ready for home discharge.   Con able she issults:   Significant Diagnostic Studies:   Treatments:   Discharge Exam: Blood pressure 132/78, pulse 91, temperature 98.4 F (36.9 C), temperature source Oral, resp. rate 18, last menstrual period 09/07/2011, SpO2 98.00%. Awake and alert. Oriented and appropriate. Cranial nerve function intact. Motor and sensory function of the extremities normal. Wound clean and dry. Chest and abdomen benign.   Disposition: 01-Home or Self Care   Future Appointments Provider Department Dept Phone   05/13/2013 9:00 AM Salley Scarlet, MD [facility] [phone]       Medication List         albuterol 108 (90 BASE) MCG/ACT inhaler  Commonly known as:  PROVENTIL HFA;VENTOLIN HFA  Inhale 2 puffs into the lungs every 6 (six) hours as needed for wheezing.     cyclobenzaprine 10 MG tablet  Commonly known as:  FLEXERIL  Take 1 tablet (10 mg total) by mouth every 8 (eight) hours as needed for muscle spasms.     diazepam 5 MG tablet  Commonly known as:  VALIUM  Take 1-2 tablets (5-10 mg total) by mouth every 6 (six) hours as needed.     ferrous sulfate 325 (65 FE) MG EC tablet  Take 325 mg by mouth daily with breakfast.     FLUoxetine 40 MG capsule  Commonly known  as:  PROZAC  Take 40 mg by mouth daily before breakfast.     fluticasone 50 MCG/ACT nasal spray  Commonly known as:  FLONASE  Place 2 sprays into the nose daily.     furosemide 20 MG tablet  Commonly known as:  LASIX  Take 20 mg by mouth daily before breakfast.     gabapentin 300 MG capsule  Commonly known as:  NEURONTIN  Take 300 mg by mouth 2 (two) times daily.     levothyroxine 200 MCG tablet  Commonly known as:  SYNTHROID, LEVOTHROID  Take 200 mcg by mouth daily before breakfast. Pt has to have brand name     lisinopril 40 MG tablet  Commonly known as:  PRINIVIL,ZESTRIL  TAKE 1 TABLET BY MOUTH DAILY     metFORMIN 1000 MG tablet  Commonly known as:  GLUCOPHAGE  Take 1,000 mg by mouth 2 (two) times daily with a meal.     naproxen 500 MG tablet  Commonly known as:  NAPROSYN  Take 500 mg by mouth 2 (two) times daily with a meal.     omeprazole 40 MG capsule  Commonly known as:  PRILOSEC  Take 40 mg by mouth every morning.     oxyCODONE-acetaminophen 5-325 MG per tablet  Commonly known as:  PERCOCET/ROXICET  Take 1-2 tablets by mouth every 4 (four) hours as needed for pain.     simvastatin 40 MG tablet  Commonly known as:  ZOCOR  Take 1 tablet (40 mg total)  by mouth daily.     zolpidem 5 MG tablet  Commonly known as:  AMBIEN  Take 5 mg by mouth at bedtime as needed for sleep (sleep).         Signed: Jarrius Huaracha A 04/09/2013, 9:42 AM

## 2013-04-23 ENCOUNTER — Other Ambulatory Visit: Payer: Self-pay | Admitting: Family Medicine

## 2013-04-24 NOTE — Telephone Encounter (Signed)
?   Ok to refill, last ov 02/10/13

## 2013-04-24 NOTE — Telephone Encounter (Signed)
Okay to refill? 

## 2013-04-24 NOTE — Telephone Encounter (Signed)
rx called in

## 2013-05-13 ENCOUNTER — Ambulatory Visit (INDEPENDENT_AMBULATORY_CARE_PROVIDER_SITE_OTHER): Payer: Medicare Other | Admitting: Family Medicine

## 2013-05-13 ENCOUNTER — Encounter: Payer: Self-pay | Admitting: Family Medicine

## 2013-05-13 VITALS — BP 142/78 | HR 68 | Temp 98.4°F | Resp 16 | Wt 304.0 lb

## 2013-05-13 DIAGNOSIS — F418 Other specified anxiety disorders: Secondary | ICD-10-CM

## 2013-05-13 DIAGNOSIS — I10 Essential (primary) hypertension: Secondary | ICD-10-CM

## 2013-05-13 DIAGNOSIS — F341 Dysthymic disorder: Secondary | ICD-10-CM

## 2013-05-13 DIAGNOSIS — Z23 Encounter for immunization: Secondary | ICD-10-CM

## 2013-05-13 DIAGNOSIS — M48062 Spinal stenosis, lumbar region with neurogenic claudication: Secondary | ICD-10-CM

## 2013-05-13 DIAGNOSIS — E119 Type 2 diabetes mellitus without complications: Secondary | ICD-10-CM

## 2013-05-13 NOTE — Assessment & Plan Note (Signed)
Well controlled, no change to meds Schedule eye appt

## 2013-05-13 NOTE — Patient Instructions (Signed)
COntinue current medication Pneumonia vaccine given Schedule an Eye appt Script for shingles vaccine given F/U 4 months for physical

## 2013-05-13 NOTE — Assessment & Plan Note (Signed)
Doing well continues with therapist as needed

## 2013-05-13 NOTE — Assessment & Plan Note (Signed)
Bp elevated some today, will have her check at home, continue lisinopril Consider adding norvasc 5

## 2013-05-13 NOTE — Progress Notes (Signed)
  Subjective:    Patient ID: Autumn Carson, female    DOB: 1955-10-30, 57 y.o.   MRN: [id number]  HPI  Patient here to follow chronic medical problems. She has no specific concerns today. She is status post back surgery performed by Dr. Renae Fickle secondary to spinal stenosis and disc bulge. She is doing well and is at home doing exercises. She uses Percocet for pain and Valium at night as needed  Hypothyroidism she was seen by her endocrinologist recently her medications were adjusted accordingly.  Diabetes mellitus her blood sugar was 6.4 performed once an outside labs. Her fasting blood sugars have been in the low 100s. She is due for ophthalmology exam. She's also due for pneumonia vaccine No hypoglycemia   Review of Systems  GEN- denies fatigue, fever, weight loss,weakness, recent illness HEENT- denies eye drainage, change in vision, nasal discharge, CVS- denies chest pain, palpitations RESP- denies SOB, cough, wheeze ABD- denies N/V, change in stools, abd pain GU- denies dysuria, hematuria, dribbling, incontinence MSK- + joint pain, muscle aches, injury Neuro- denies headache, dizziness, syncope, seizure activity      Objective:   Physical Exam GEN- NAD, alert and oriented x3, wearing back brace HEENT- PERRL, EOMI, non injected sclera, pink conjunctiva, MMM, oropharynx clear Neck- Supple,  CVS- RRR, no murmur RESP-CTAB EXT- No edema, Feet- no open lesions,  Pulses- Radial, DP- 2+        Assessment & Plan:

## 2013-05-13 NOTE — Assessment & Plan Note (Signed)
Doing well s/p surgery, f/u neurosurgery has good pain control

## 2013-05-23 NOTE — Progress Notes (Signed)
REVIEWED.  TCS/EGD MAY 2014 TICS, IH  FG POLYPS GIVEN/ENETROSCOPY JUN 2014 GASTRITIS/SM BOWEL ULCERS  PT NEED RPV E30 IN NOV OR DEC 2014.

## 2013-05-26 NOTE — Progress Notes (Signed)
Reminder in Epic 

## 2013-05-31 ENCOUNTER — Other Ambulatory Visit: Payer: Self-pay | Admitting: Family Medicine

## 2013-06-01 NOTE — Telephone Encounter (Signed)
Medication refilled per protocol. 

## 2013-06-11 ENCOUNTER — Other Ambulatory Visit: Payer: Self-pay | Admitting: Family Medicine

## 2013-06-11 DIAGNOSIS — Z1231 Encounter for screening mammogram for malignant neoplasm of breast: Secondary | ICD-10-CM

## 2013-06-24 ENCOUNTER — Other Ambulatory Visit: Payer: Self-pay | Admitting: Family Medicine

## 2013-07-08 DIAGNOSIS — M48061 Spinal stenosis, lumbar region without neurogenic claudication: Secondary | ICD-10-CM | POA: Insufficient documentation

## 2013-07-13 ENCOUNTER — Ambulatory Visit (HOSPITAL_COMMUNITY)
Admission: RE | Admit: 2013-07-13 | Discharge: 2013-07-13 | Disposition: A | Discharge status: Home / Self Care | Payer: Medicare Other | Source: Ambulatory Visit | Attending: Family Medicine | Admitting: Family Medicine

## 2013-07-13 DIAGNOSIS — Z1231 Encounter for screening mammogram for malignant neoplasm of breast: Secondary | ICD-10-CM

## 2013-07-26 ENCOUNTER — Other Ambulatory Visit: Payer: Self-pay | Admitting: Family Medicine

## 2013-07-27 NOTE — Telephone Encounter (Signed)
Last Ambien refill 9/25 #15 Last OV 10/15  OK refill?

## 2013-07-28 NOTE — Telephone Encounter (Signed)
Okay to refill, give 15 tabs R 1 on Ambien

## 2013-08-06 ENCOUNTER — Other Ambulatory Visit: Payer: Self-pay | Admitting: Family Medicine

## 2013-08-11 ENCOUNTER — Other Ambulatory Visit: Payer: Self-pay | Admitting: Internal Medicine

## 2013-08-11 DIAGNOSIS — C73 Malignant neoplasm of thyroid gland: Secondary | ICD-10-CM

## 2013-08-14 ENCOUNTER — Ambulatory Visit (INDEPENDENT_AMBULATORY_CARE_PROVIDER_SITE_OTHER): Payer: Medicare HMO | Admitting: Family Medicine

## 2013-08-14 ENCOUNTER — Encounter: Payer: Self-pay | Admitting: Family Medicine

## 2013-08-14 VITALS — BP 130/80 | HR 78 | Temp 98.6°F | Resp 18 | Ht 65.0 in | Wt 307.0 lb

## 2013-08-14 DIAGNOSIS — F418 Other specified anxiety disorders: Secondary | ICD-10-CM

## 2013-08-14 DIAGNOSIS — E119 Type 2 diabetes mellitus without complications: Secondary | ICD-10-CM

## 2013-08-14 DIAGNOSIS — M7989 Other specified soft tissue disorders: Secondary | ICD-10-CM

## 2013-08-14 DIAGNOSIS — I10 Essential (primary) hypertension: Secondary | ICD-10-CM

## 2013-08-14 DIAGNOSIS — F341 Dysthymic disorder: Secondary | ICD-10-CM

## 2013-08-14 DIAGNOSIS — E785 Hyperlipidemia, unspecified: Secondary | ICD-10-CM

## 2013-08-14 DIAGNOSIS — M48062 Spinal stenosis, lumbar region with neurogenic claudication: Secondary | ICD-10-CM

## 2013-08-14 LAB — COMPREHENSIVE METABOLIC PANEL
ALBUMIN: 4.3 g/dL (ref 3.5–5.2)
ALK PHOS: 68 U/L (ref 39–117)
ALT: 11 U/L (ref 0–35)
AST: 13 U/L (ref 0–37)
BUN: 15 mg/dL (ref 6–23)
CHLORIDE: 104 meq/L (ref 96–112)
CO2: 27 mEq/L (ref 19–32)
Calcium: 9.2 mg/dL (ref 8.4–10.5)
Creat: 0.93 mg/dL (ref 0.50–1.10)
Glucose, Bld: 100 mg/dL — ABNORMAL HIGH (ref 70–99)
POTASSIUM: 4.4 meq/L (ref 3.5–5.3)
SODIUM: 142 meq/L (ref 135–145)
TOTAL PROTEIN: 6.9 g/dL (ref 6.0–8.3)
Total Bilirubin: 0.4 mg/dL (ref 0.3–1.2)

## 2013-08-14 LAB — CBC WITH DIFFERENTIAL/PLATELET
BASOS ABS: 0 10*3/uL (ref 0.0–0.1)
BASOS PCT: 0 % (ref 0–1)
EOS ABS: 0.2 10*3/uL (ref 0.0–0.7)
Eosinophils Relative: 4 % (ref 0–5)
HCT: 31.6 % — ABNORMAL LOW (ref 36.0–46.0)
Hemoglobin: 9.8 g/dL — ABNORMAL LOW (ref 12.0–15.0)
Lymphocytes Relative: 31 % (ref 12–46)
Lymphs Abs: 1.6 10*3/uL (ref 0.7–4.0)
MCH: 24 pg — AB (ref 26.0–34.0)
MCHC: 31 g/dL (ref 30.0–36.0)
MCV: 77.5 fL — ABNORMAL LOW (ref 78.0–100.0)
MONOS PCT: 9 % (ref 3–12)
Monocytes Absolute: 0.5 10*3/uL (ref 0.1–1.0)
NEUTROS PCT: 56 % (ref 43–77)
Neutro Abs: 2.9 10*3/uL (ref 1.7–7.7)
PLATELETS: 311 10*3/uL (ref 150–400)
RBC: 4.08 MIL/uL (ref 3.87–5.11)
RDW: 16.7 % — AB (ref 11.5–15.5)
WBC: 5.1 10*3/uL (ref 4.0–10.5)

## 2013-08-14 LAB — LIPID PANEL
CHOLESTEROL: 147 mg/dL (ref 0–200)
HDL: 48 mg/dL (ref >39)
LDL Cholesterol: 82 mg/dL (ref 0–99)
Total CHOL/HDL Ratio: 3.1 Ratio
Triglycerides: 84 mg/dL (ref <150)
VLDL: 17 mg/dL (ref 0–40)

## 2013-08-14 LAB — HEMOGLOBIN A1C
HEMOGLOBIN A1C: 6.5 % — AB (ref <5.7)
Mean Plasma Glucose: 140 mg/dL — ABNORMAL HIGH (ref <117)

## 2013-08-14 MED ORDER — METOPROLOL SUCCINATE ER 25 MG PO TB24: 25.0000 mg | ORAL_TABLET | Freq: Every day | ORAL | Status: DC

## 2013-08-14 MED ORDER — ZOLPIDEM TARTRATE 5 MG PO TABS: ORAL_TABLET | ORAL | Status: DC

## 2013-08-14 MED ORDER — OXYCODONE-ACETAMINOPHEN 5-325 MG PO TABS: 1.0000 | ORAL_TABLET | Freq: Four times a day (QID) | ORAL | Status: DC | PRN

## 2013-08-14 MED ORDER — FLUOXETINE HCL 40 MG PO CAPS: 40.0000 mg | ORAL_CAPSULE | Freq: Every day | ORAL | Status: DC

## 2013-08-14 NOTE — Progress Notes (Addendum)
   Subjective:    Patient ID: Autumn Carson, female    DOB: 06-24-1956, 58 y.o.   MRN: [id number]  HPI Patient here to follow chronic medical problems. Unfortunately she's been out of her Prozac for the past 3 weeks. She will like to restart this. Her daughter is currently in the ICU after having 2 heart attacks and a possible stroke she is on life support. She's not been sleeping well. She's been seen at the hospital day and night to be with her daughter and because of that has had some increased back pain as well. She's been released from her previous neurosurgeon Dr. Trenton Gammon and requests a refill on her pain medication  DM- blood sugars have been well-controlled   Review of Systems  GEN- denies fatigue, fever, weight loss,weakness, recent illness HEENT- denies eye drainage, change in vision, nasal discharge, CVS- denies chest pain, palpitations RESP- denies SOB, cough, wheeze ABD- denies N/V, change in stools, abd pain GU- denies dysuria, hematuria, dribbling, incontinence MSK- + joint pain, muscle aches, injury Neuro- denies headache, dizziness, syncope, seizure activity      Objective:   Physical Exam  GEN- NAD, alert and oriented x3 HEENT- PERRL, EOMI, non injected sclera, pink conjunctiva, MMM, oropharynx clear Neck- Supple,  CVS- RRR, no murmur RESP-CTAB EXT- No edema Pulses- Radial, DP- 2+ Psych- normal affect and mood  Repeat BP 152/90      Assessment & Plan:

## 2013-08-14 NOTE — Patient Instructions (Addendum)
Obtain last note Dr. Trenton Gammon- neurosurgery  Restart the prozac  Start Toprol once a day for blood pressure, take in the morning We will call with lab results  F/U  4 weeks

## 2013-08-16 NOTE — Assessment & Plan Note (Signed)
Check FLP, on statin

## 2013-08-16 NOTE — Assessment & Plan Note (Addendum)
Continue lasix , elevate feet

## 2013-08-16 NOTE — Assessment & Plan Note (Signed)
- 

## 2013-08-16 NOTE — Assessment & Plan Note (Signed)
S/p surgeries Pain medication refilled

## 2013-08-16 NOTE — Assessment & Plan Note (Signed)
Add toprol 25mg  to regimen

## 2013-08-16 NOTE — Assessment & Plan Note (Signed)
Diet controlled, check A1C 

## 2013-08-17 ENCOUNTER — Inpatient Hospital Stay: Admission: RE | Admit: 2013-08-17 | Payer: Self-pay | Source: Ambulatory Visit

## 2013-08-17 ENCOUNTER — Encounter: Payer: Self-pay | Admitting: *Deleted

## 2013-08-20 ENCOUNTER — Telehealth: Payer: Self-pay | Admitting: *Deleted

## 2013-08-20 MED ORDER — NAPROXEN 500 MG PO TABS: 500.0000 mg | ORAL_TABLET | Freq: Two times a day (BID) | ORAL | Status: DC

## 2013-08-20 MED ORDER — GABAPENTIN 300 MG PO CAPS: 300.0000 mg | ORAL_CAPSULE | Freq: Two times a day (BID) | ORAL | Status: DC

## 2013-08-20 MED ORDER — OMEPRAZOLE 40 MG PO CPDR: DELAYED_RELEASE_CAPSULE | ORAL | Status: DC

## 2013-08-20 MED ORDER — FLUTICASONE PROPIONATE 50 MCG/ACT NA SUSP: 2.0000 | Freq: Every day | NASAL | Status: DC

## 2013-08-20 MED ORDER — METFORMIN HCL 1000 MG PO TABS: ORAL_TABLET | ORAL | Status: DC

## 2013-08-20 MED ORDER — ALBUTEROL SULFATE HFA 108 (90 BASE) MCG/ACT IN AERS: 2.0000 | INHALATION_SPRAY | Freq: Four times a day (QID) | RESPIRATORY_TRACT | Status: DC | PRN

## 2013-08-20 MED ORDER — LISINOPRIL 40 MG PO TABS: ORAL_TABLET | ORAL | Status: DC

## 2013-08-20 NOTE — Telephone Encounter (Signed)
All meds were refilled

## 2013-08-26 ENCOUNTER — Ambulatory Visit: Payer: Self-pay | Admitting: Gastroenterology

## 2013-09-01 ENCOUNTER — Ambulatory Visit
Admission: RE | Admit: 2013-09-01 | Discharge: 2013-09-01 | Disposition: A | Discharge status: Home / Self Care | Payer: Medicare HMO | Source: Ambulatory Visit | Attending: Internal Medicine | Admitting: Internal Medicine

## 2013-09-01 DIAGNOSIS — C73 Malignant neoplasm of thyroid gland: Secondary | ICD-10-CM

## 2013-09-10 ENCOUNTER — Telehealth: Payer: Self-pay | Admitting: *Deleted

## 2013-09-10 MED ORDER — ACCU-CHEK FASTCLIX LANCETS MISC: 1.0000 [IU] | Freq: Every day | Status: DC

## 2013-09-10 MED ORDER — GLUCOSE BLOOD VI STRP: ORAL_STRIP | Status: DC

## 2013-09-10 NOTE — Telephone Encounter (Signed)
Meds refilled.

## 2013-09-10 NOTE — Telephone Encounter (Signed)
?   Ok to refill, last ov 08/14/13,11/03/12

## 2013-09-11 ENCOUNTER — Ambulatory Visit: Payer: Medicare HMO | Admitting: Family Medicine

## 2013-09-11 MED ORDER — FUROSEMIDE 20 MG PO TABS: 20.0000 mg | ORAL_TABLET | Freq: Every day | ORAL | Status: DC

## 2013-09-11 NOTE — Telephone Encounter (Signed)
Meds refilled.

## 2013-09-11 NOTE — Telephone Encounter (Signed)
Okay to refill, this is a water pill

## 2013-09-16 ENCOUNTER — Encounter: Payer: Self-pay | Admitting: Family Medicine

## 2013-09-16 ENCOUNTER — Ambulatory Visit (INDEPENDENT_AMBULATORY_CARE_PROVIDER_SITE_OTHER): Payer: Medicare HMO | Admitting: Family Medicine

## 2013-09-16 VITALS — BP 136/90 | HR 72 | Temp 98.2°F | Resp 18 | Ht 65.0 in | Wt 309.0 lb

## 2013-09-16 DIAGNOSIS — F418 Other specified anxiety disorders: Secondary | ICD-10-CM

## 2013-09-16 DIAGNOSIS — M25561 Pain in right knee: Secondary | ICD-10-CM

## 2013-09-16 DIAGNOSIS — M25569 Pain in unspecified knee: Secondary | ICD-10-CM

## 2013-09-16 DIAGNOSIS — F341 Dysthymic disorder: Secondary | ICD-10-CM

## 2013-09-16 DIAGNOSIS — I10 Essential (primary) hypertension: Secondary | ICD-10-CM

## 2013-09-16 MED ORDER — OXYCODONE-ACETAMINOPHEN 5-325 MG PO TABS: 1.0000 | ORAL_TABLET | Freq: Four times a day (QID) | ORAL | Status: DC | PRN

## 2013-09-16 NOTE — Assessment & Plan Note (Signed)
Continue current medication improved Will have her check at home as well

## 2013-09-16 NOTE — Patient Instructions (Signed)
Continue current medications Get knee brace Release of records- Dr. Trenton Gammon- Last note F/U 3 months

## 2013-09-16 NOTE — Assessment & Plan Note (Signed)
I think this is osteoarthritis of her knee. At this time due to her family situation she wants to hold off on imaging and workup. I do not see any effusion today. I will have her take anti-inflammatories also given knee stabilizing brace over-the-counter chart he has a cane. When she is able to we will get plain films of the knee we may need orthopedic input

## 2013-09-16 NOTE — Assessment & Plan Note (Signed)
Improved with prozac, continue current dose No red flags

## 2013-09-16 NOTE — Progress Notes (Signed)
Patient ID: Autumn Carson, female   DOB: 05-11-1956, 58 y.o.   MRN: [id number]   Subjective:    Patient ID: Autumn Carson, female    DOB: 05-26-1956, 58 y.o.   MRN: [id number]  Patient presents for 4 week follow up  patient here to followup hypertension and medications. Her last visit she was started on Toprol 25 mg once a day for blood pressure at home has been systolic 353G over 99M diastolic. She's had a lot of stress in her life as her daughter is currently home from the hospital she suffered to cardiac arrest and possible mini stroke and now has difficulty walking and is living with her for the remainder of her therapy. She did restart her Prozac at the last visit and states that she feels much better with the medication. Overall she states that she feels physically well with the exception of some right knee pain which is worsened over the past couple months. She does get swelling in the knee and giving out on her a few times where she is falling down to the floor. She has been using her Naprosyn which helps.    Review Of Systems:  GEN- denies fatigue, fever, weight loss,weakness, recent illness CVS- denies chest pain, palpitations RESP- denies SOB, cough, wheeze MSK- + joint pain, muscle aches, injury Neuro- denies headache, dizziness, syncope, seizure activity       Objective:    BP 136/90  Pulse 72  Temp(Src) 98.2 F (36.8 C) (Oral)  Resp 18  Ht 5\' 5"  (1.651 m)  Wt 309 lb (140.161 kg)  BMI 51.42 kg/m2  LMP 09/07/2011 GEN- NAD, alert and oriented x3 MSK- Right knee- large knee appearance, no effusion palpated, mild crepitus, decreased extension, no warmth EXT- No edema Pulses- Radial 2+ Psych- Normal affect and mood        Assessment & Plan:      Problem List Items Addressed This Visit   Knee pain, right     I think this is osteoarthritis of her knee. At this time due to her family situation she wants to hold off on imaging and workup. I do not  see any effusion today. I will have her take anti-inflammatories also given knee stabilizing brace over-the-counter chart he has a cane. When she is able to we will get plain films of the knee we may need orthopedic input    HYPERTENSION - Primary     Continue current medication improved Will have her check at home as well    Depression with anxiety     Improved with prozac, continue current dose No red flags       Note: This dictation was prepared with Dragon dictation along with smaller phrase technology. Any transcriptional errors that result from this process are unintentional.

## 2013-09-29 ENCOUNTER — Other Ambulatory Visit: Payer: Self-pay | Admitting: Family Medicine

## 2013-09-29 NOTE — Telephone Encounter (Signed)
Refill appropriate and filled per protocol. 

## 2013-10-08 ENCOUNTER — Other Ambulatory Visit: Payer: Self-pay | Admitting: *Deleted

## 2013-10-08 MED ORDER — SIMVASTATIN 40 MG PO TABS: ORAL_TABLET | ORAL | Status: DC

## 2013-10-08 NOTE — Telephone Encounter (Signed)
Med refilled sent to [facility]

## 2013-10-16 ENCOUNTER — Other Ambulatory Visit: Payer: Self-pay | Admitting: Family Medicine

## 2013-10-19 NOTE — Telephone Encounter (Signed)
Medication refilled per protocol. 

## 2013-11-19 ENCOUNTER — Other Ambulatory Visit: Payer: Self-pay | Admitting: *Deleted

## 2013-11-19 DIAGNOSIS — E119 Type 2 diabetes mellitus without complications: Secondary | ICD-10-CM

## 2013-11-19 MED ORDER — FLUOXETINE HCL 40 MG PO CAPS: 40.0000 mg | ORAL_CAPSULE | Freq: Every day | ORAL | Status: DC

## 2013-11-19 MED ORDER — METOPROLOL SUCCINATE ER 25 MG PO TB24: 25.0000 mg | ORAL_TABLET | Freq: Every day | ORAL | Status: DC

## 2013-11-19 NOTE — Telephone Encounter (Signed)
Refill appropriate and filled per protocol. 

## 2013-12-02 ENCOUNTER — Other Ambulatory Visit: Payer: Self-pay | Admitting: Family Medicine

## 2013-12-03 NOTE — Telephone Encounter (Signed)
Refill appropriate and filled per protocol. 

## 2013-12-05 ENCOUNTER — Other Ambulatory Visit: Payer: Self-pay | Admitting: Family Medicine

## 2013-12-07 NOTE — Telephone Encounter (Signed)
Refill appropriate and filled per protocol. 

## 2013-12-14 ENCOUNTER — Encounter: Payer: Self-pay | Admitting: Family Medicine

## 2013-12-14 ENCOUNTER — Ambulatory Visit (INDEPENDENT_AMBULATORY_CARE_PROVIDER_SITE_OTHER): Payer: Medicare HMO | Admitting: Family Medicine

## 2013-12-14 VITALS — BP 136/88 | HR 64 | Temp 98.1°F | Resp 16 | Ht 65.0 in | Wt 311.0 lb

## 2013-12-14 DIAGNOSIS — M25569 Pain in unspecified knee: Secondary | ICD-10-CM

## 2013-12-14 DIAGNOSIS — M25561 Pain in right knee: Secondary | ICD-10-CM

## 2013-12-14 DIAGNOSIS — E785 Hyperlipidemia, unspecified: Secondary | ICD-10-CM

## 2013-12-14 DIAGNOSIS — E119 Type 2 diabetes mellitus without complications: Secondary | ICD-10-CM

## 2013-12-14 DIAGNOSIS — F341 Dysthymic disorder: Secondary | ICD-10-CM

## 2013-12-14 DIAGNOSIS — K219 Gastro-esophageal reflux disease without esophagitis: Secondary | ICD-10-CM

## 2013-12-14 DIAGNOSIS — I10 Essential (primary) hypertension: Secondary | ICD-10-CM

## 2013-12-14 DIAGNOSIS — M1990 Unspecified osteoarthritis, unspecified site: Secondary | ICD-10-CM

## 2013-12-14 DIAGNOSIS — F418 Other specified anxiety disorders: Secondary | ICD-10-CM

## 2013-12-14 LAB — HEMOGLOBIN A1C
Hgb A1c MFr Bld: 6.5 % — ABNORMAL HIGH (ref <5.7)
Mean Plasma Glucose: 140 mg/dL — ABNORMAL HIGH (ref <117)

## 2013-12-14 LAB — CBC WITH DIFFERENTIAL/PLATELET
Basophils Absolute: 0 10*3/uL (ref 0.0–0.1)
Basophils Relative: 1 % (ref 0–1)
Eosinophils Absolute: 0.1 10*3/uL (ref 0.0–0.7)
Eosinophils Relative: 3 % (ref 0–5)
HEMATOCRIT: 32.5 % — AB (ref 36.0–46.0)
HEMOGLOBIN: 10.3 g/dL — AB (ref 12.0–15.0)
LYMPHS ABS: 1.4 10*3/uL (ref 0.7–4.0)
Lymphocytes Relative: 32 % (ref 12–46)
MCH: 23.6 pg — ABNORMAL LOW (ref 26.0–34.0)
MCHC: 31.7 g/dL (ref 30.0–36.0)
MCV: 74.5 fL — ABNORMAL LOW (ref 78.0–100.0)
MONOS PCT: 7 % (ref 3–12)
Monocytes Absolute: 0.3 10*3/uL (ref 0.1–1.0)
NEUTROS ABS: 2.5 10*3/uL (ref 1.7–7.7)
NEUTROS PCT: 57 % (ref 43–77)
Platelets: 286 10*3/uL (ref 150–400)
RBC: 4.36 MIL/uL (ref 3.87–5.11)
RDW: 17.6 % — ABNORMAL HIGH (ref 11.5–15.5)
WBC: 4.4 10*3/uL (ref 4.0–10.5)

## 2013-12-14 LAB — COMPREHENSIVE METABOLIC PANEL
ALBUMIN: 4.3 g/dL (ref 3.5–5.2)
ALT: 8 U/L (ref 0–35)
AST: 12 U/L (ref 0–37)
Alkaline Phosphatase: 59 U/L (ref 39–117)
BUN: 15 mg/dL (ref 6–23)
CO2: 29 meq/L (ref 19–32)
Calcium: 9.4 mg/dL (ref 8.4–10.5)
Chloride: 102 mEq/L (ref 96–112)
Creat: 0.88 mg/dL (ref 0.50–1.10)
GLUCOSE: 102 mg/dL — AB (ref 70–99)
POTASSIUM: 4.2 meq/L (ref 3.5–5.3)
Sodium: 139 mEq/L (ref 135–145)
Total Bilirubin: 0.4 mg/dL (ref 0.2–1.2)
Total Protein: 6.8 g/dL (ref 6.0–8.3)

## 2013-12-14 LAB — LIPID PANEL
CHOL/HDL RATIO: 3.3 ratio
Cholesterol: 164 mg/dL (ref 0–200)
HDL: 50 mg/dL (ref >39)
LDL Cholesterol: 92 mg/dL (ref 0–99)
Triglycerides: 110 mg/dL (ref <150)
VLDL: 22 mg/dL (ref 0–40)

## 2013-12-14 MED ORDER — OXYCODONE-ACETAMINOPHEN 5-325 MG PO TABS
1.0000 | ORAL_TABLET | Freq: Four times a day (QID) | ORAL | Status: DC | PRN
Start: 2013-12-14 — End: 2014-06-23

## 2013-12-14 NOTE — Patient Instructions (Signed)
Referral to orthopedics for your knee Continue naprosyn and the percocet as needed F/U 4 months

## 2013-12-14 NOTE — Progress Notes (Signed)
Patient ID: Autumn Carson, female   DOB: [birth year], 58 y.o.   MRN: [id number]   Subjective:    Patient ID: Autumn Carson, female    DOB: [birth year], 58 y.o.   MRN: [id number]  Patient presents for 3 month F/U and L knee pain  patient here to followup chronic medical problems. She's been on a lot of stress recently secondary to her daughter's failing health some difficulties in her marriage. She is establishing herself with a new therapist. She was to continue her current dose of medications. He does not have any suicidal ideations. Her sleep has been okay.  Right knee pain we discussed this at her last visit she is nonradiating pursue orthopedics for her knee. She's continues to have swelling as well as locking giving out of the knee. She pressor refill on the Percocet. She's also taking the Naprosyn which helps.  Diabetes mellitus she's currently on metformin as prescribed. She's also taking her lisinopril and her statin drug. She's due for repeat A1c  Medications reviewed.    Review Of Systems:  GEN- denies fatigue, fever, weight loss,weakness, recent illness HEENT- denies eye drainage, change in vision, nasal discharge, CVS- denies chest pain, palpitations RESP- denies SOB, cough, wheeze ABD- denies N/V, change in stools, abd pain GU- denies dysuria, hematuria, dribbling, incontinence MSK- +oint pain, muscle aches, injury Neuro- denies headache, dizziness, syncope, seizure activity       Objective:    BP 136/88  Pulse 64  Temp(Src) 98.1 F (36.7 C) (Oral)  Resp 16  Ht 5\' 5"  (1.651 m)  Wt 311 lb (141.069 kg)  BMI 51.75 kg/m2  LMP 09/07/2011 GEN- NAD, alert and oriented x3 HEENT- PERRL, EOMI, non injected sclera, pink conjunctiva, MMM, oropharynx clear Neck- Supple,  CVS- RRR, no murmur RESP-CTAB3 MSK- Right knee- large knee appearance, no effusion palpated, mild crepitus, decreased extension, no warmth Psych- normal affect and mood EXT- No  edema Pulses- Radial, DP- 2+        Assessment & Plan:      Problem List Items Addressed This Visit   OSTEOARTHRITIS   Relevant Medications      aspirin 81 MG tablet      oxyCODONE-acetaminophen (PERCOCET/ROXICET) 5-325 MG per tablet   Knee pain, right   HYPERTENSION   Relevant Medications      aspirin 81 MG tablet   HYPERLIPIDEMIA   Relevant Medications      aspirin 81 MG tablet   Other Relevant Orders      Lipid panel (Completed)   GERD   DIABETES MELLITUS, TYPE II - Primary   Relevant Medications      aspirin 81 MG tablet   Other Relevant Orders      CBC with Differential (Completed)      Comprehensive metabolic panel (Completed)      Hemoglobin A1c (Completed)   Depression with anxiety      Note: This dictation was prepared with Dragon dictation along with smaller phrase technology. Any transcriptional errors that result from this process are unintentional.

## 2013-12-14 NOTE — Assessment & Plan Note (Signed)
Continue current medication she will reestablish with therapies. There no red flags this a lot in the home life that needs to be situated with her adult children.

## 2013-12-14 NOTE — Assessment & Plan Note (Signed)
Referral to orthopedics for evaluation Continue naprosyn Percocet refill

## 2013-12-14 NOTE — Assessment & Plan Note (Signed)
Recheck A1c 

## 2013-12-14 NOTE — Assessment & Plan Note (Signed)
Blood pressure well controlled

## 2013-12-15 ENCOUNTER — Encounter: Payer: Self-pay | Admitting: *Deleted

## 2014-02-16 ENCOUNTER — Other Ambulatory Visit: Payer: Self-pay | Admitting: Family Medicine

## 2014-02-17 NOTE — Telephone Encounter (Signed)
Ok to refill??  Last office visit 12/14/2013.  Last refill 1/16/20153

## 2014-02-17 NOTE — Telephone Encounter (Signed)
Okay 

## 2014-02-17 NOTE — Telephone Encounter (Signed)
Medication called to pharmacy. 

## 2014-02-18 ENCOUNTER — Other Ambulatory Visit: Payer: Self-pay | Admitting: Family Medicine

## 2014-02-18 NOTE — Telephone Encounter (Signed)
Refill appropriate and filled per protocol. 

## 2014-03-17 ENCOUNTER — Other Ambulatory Visit: Payer: Self-pay | Admitting: Family Medicine

## 2014-04-16 ENCOUNTER — Ambulatory Visit: Payer: Medicare HMO | Admitting: Family Medicine

## 2014-05-14 ENCOUNTER — Other Ambulatory Visit: Payer: Self-pay | Admitting: Family Medicine

## 2014-05-14 ENCOUNTER — Telehealth: Payer: Self-pay | Admitting: *Deleted

## 2014-05-14 DIAGNOSIS — M25561 Pain in right knee: Secondary | ICD-10-CM

## 2014-05-14 DIAGNOSIS — M159 Polyosteoarthritis, unspecified: Secondary | ICD-10-CM

## 2014-05-14 NOTE — Telephone Encounter (Signed)
Pt called stating she is needing a new referral to [facility], she had an appointment in June but pt cancelled that appointment and now requesting a new referral d/t having same problem, she has appointment with Antionette Char on Oct 27 but will not see her until referral is placed, and also needs referral from [facility]. I will place order for [facility] and submit authorization from [facility]

## 2014-05-14 NOTE — Telephone Encounter (Signed)
Submitted [employer] referral thru acuity connect for Dr. Aundra Dubin ortho, with authorization number [id number], once receive paper copy from [facility] will fax to [facility].

## 2014-06-23 ENCOUNTER — Ambulatory Visit (INDEPENDENT_AMBULATORY_CARE_PROVIDER_SITE_OTHER): Payer: Medicare HMO | Admitting: Family Medicine

## 2014-06-23 ENCOUNTER — Encounter: Payer: Self-pay | Admitting: Family Medicine

## 2014-06-23 VITALS — BP 132/78 | HR 78 | Temp 98.6°F | Resp 18 | Ht 66.0 in | Wt 326.0 lb

## 2014-06-23 DIAGNOSIS — M1710 Unilateral primary osteoarthritis, unspecified knee: Secondary | ICD-10-CM | POA: Insufficient documentation

## 2014-06-23 DIAGNOSIS — E119 Type 2 diabetes mellitus without complications: Secondary | ICD-10-CM

## 2014-06-23 DIAGNOSIS — M170 Bilateral primary osteoarthritis of knee: Secondary | ICD-10-CM

## 2014-06-23 DIAGNOSIS — E785 Hyperlipidemia, unspecified: Secondary | ICD-10-CM

## 2014-06-23 DIAGNOSIS — M5136 Other intervertebral disc degeneration, lumbar region: Secondary | ICD-10-CM

## 2014-06-23 DIAGNOSIS — M179 Osteoarthritis of knee, unspecified: Secondary | ICD-10-CM | POA: Insufficient documentation

## 2014-06-23 DIAGNOSIS — I10 Essential (primary) hypertension: Secondary | ICD-10-CM

## 2014-06-23 MED ORDER — OXYCODONE-ACETAMINOPHEN 5-325 MG PO TABS: 1.0000 | ORAL_TABLET | Freq: Four times a day (QID) | ORAL | Status: DC | PRN

## 2014-06-23 NOTE — Patient Instructions (Signed)
Work on dietary changes low carb, low fat Continue current medications Pain medication as needed F/U 3 months

## 2014-06-23 NOTE — Assessment & Plan Note (Signed)
Check A1C, discussed dietary changes and lifestyle changes On ACEI and statin

## 2014-06-23 NOTE — Assessment & Plan Note (Signed)
F/u with neurosurgeon, pain meds given Use cane for ambulation especially with severe OA both knees

## 2014-06-23 NOTE — Progress Notes (Signed)
Patient ID: Autumn Carson, female   DOB: 06-10-1956, 58 y.o.   MRN: [id number]   Subjective:    Patient ID: Autumn Carson, female    DOB: 1956/04/11, 58 y.o.   MRN: [id number]  Patient presents for Med check  patient here to follow-up medications. She's history diabetes mellitus hypertension hyperlipidemia. She's not had an A1c in about 6 months. She states her blood sugars are around 120 fasting. No hypoglycemia.  She's been followed by orthopedics for severe bilateral osteoarthritis of the knees they did discuss gel injections with her but she thought that this will be temporary and she ultimately needs knee surgery therefore she is passed on this. She's not been caring for her health very well as her daughter was recently sick and had a heart attack. He is taking her medication as prescribed but she is gaining 15 pounds by not watching her diet and her lack of exercise.  She has had a couple falls from her knees giving out on her she was scheduled point with her back surgeon as she has felt some jarring in her lower back and some numbness that is come on and off in her left leg.    Review Of Systems:  GEN- denies fatigue, fever, weight loss,weakness, recent illness HEENT- denies eye drainage, change in vision, nasal discharge, CVS- denies chest pain, palpitations RESP- denies SOB, cough, wheeze ABD- denies N/V, change in stools, abd pain GU- denies dysuria, hematuria, dribbling, incontinence MSK+ joint pain, muscle aches, injury Neuro- denies headache, dizziness, syncope, seizure activity       Objective:    BP 132/78 mmHg  Pulse 78  Temp(Src) 98.6 F (37 C) (Oral)  Resp 18  Ht 5\' 6"  (1.676 m)  Wt 326 lb (147.873 kg)  BMI 52.64 kg/m2  LMP 09/07/2011 GEN- NAD, alert and oriented x3,obese HEENT- PERRL, EOMI, non injected sclera, pink conjunctiva, MMM, oropharynx clear Neck- Supple,  CVS- RRR, no murmur RESP-CTAB MSK- Mild TTP lumbar spine, decreased ROM,  neg SLR, decreased ROM knees, large knees EXT- pedal edema Pulses- Radial, DP- 2+        Assessment & Plan:      Problem List Items Addressed This Visit    None      Note: This dictation was prepared with Dragon dictation along with smaller phrase technology. Any transcriptional errors that result from this process are unintentional.

## 2014-06-23 NOTE — Assessment & Plan Note (Signed)
BP well controlled no change to meds

## 2014-06-24 LAB — CBC WITH DIFFERENTIAL/PLATELET
Basophils Absolute: 0.1 10*3/uL (ref 0.0–0.1)
Basophils Relative: 1 % (ref 0–1)
EOS PCT: 4 % (ref 0–5)
Eosinophils Absolute: 0.2 10*3/uL (ref 0.0–0.7)
HCT: 32.3 % — ABNORMAL LOW (ref 36.0–46.0)
Hemoglobin: 10.3 g/dL — ABNORMAL LOW (ref 12.0–15.0)
LYMPHS ABS: 1.7 10*3/uL (ref 0.7–4.0)
LYMPHS PCT: 32 % (ref 12–46)
MCH: 24.4 pg — ABNORMAL LOW (ref 26.0–34.0)
MCHC: 31.9 g/dL (ref 30.0–36.0)
MCV: 76.5 fL — AB (ref 78.0–100.0)
MPV: 11.7 fL (ref 9.4–12.4)
Monocytes Absolute: 0.4 10*3/uL (ref 0.1–1.0)
Monocytes Relative: 7 % (ref 3–12)
Neutro Abs: 3 10*3/uL (ref 1.7–7.7)
Neutrophils Relative %: 56 % (ref 43–77)
Platelets: 291 10*3/uL (ref 150–400)
RBC: 4.22 MIL/uL (ref 3.87–5.11)
RDW: 16.3 % — AB (ref 11.5–15.5)
WBC: 5.4 10*3/uL (ref 4.0–10.5)

## 2014-06-24 LAB — MICROALBUMIN / CREATININE URINE RATIO
Creatinine, Urine: 67.1 mg/dL
MICROALB UR: 1.7 mg/dL (ref <2.0)
MICROALB/CREAT RATIO: 25.3 mg/g (ref 0.0–30.0)

## 2014-06-24 LAB — COMPREHENSIVE METABOLIC PANEL
ALT: 11 U/L (ref 0–35)
AST: 14 U/L (ref 0–37)
Albumin: 4.1 g/dL (ref 3.5–5.2)
Alkaline Phosphatase: 76 U/L (ref 39–117)
BILIRUBIN TOTAL: 0.3 mg/dL (ref 0.2–1.2)
BUN: 11 mg/dL (ref 6–23)
CO2: 30 meq/L (ref 19–32)
CREATININE: 0.99 mg/dL (ref 0.50–1.10)
Calcium: 9 mg/dL (ref 8.4–10.5)
Chloride: 102 mEq/L (ref 96–112)
Glucose, Bld: 93 mg/dL (ref 70–99)
Potassium: 3.9 mEq/L (ref 3.5–5.3)
Sodium: 139 mEq/L (ref 135–145)
Total Protein: 6.7 g/dL (ref 6.0–8.3)

## 2014-06-24 LAB — HEMOGLOBIN A1C
HEMOGLOBIN A1C: 6.6 % — AB (ref <5.7)
MEAN PLASMA GLUCOSE: 143 mg/dL — AB (ref <117)

## 2014-06-28 ENCOUNTER — Encounter: Payer: Self-pay | Admitting: *Deleted

## 2014-06-28 ENCOUNTER — Telehealth: Payer: Self-pay | Admitting: Family Medicine

## 2014-06-28 NOTE — Telephone Encounter (Signed)
Patient is calling to see if can approve a knee brace through her insurance company please call her with questions at  [phone]

## 2014-06-28 NOTE — Telephone Encounter (Signed)
MD does not order braces. Patient would need to have request sent to Orthopedics or buy brace OTC.   Call placed to patient and patient made aware.

## 2014-07-15 ENCOUNTER — Other Ambulatory Visit: Payer: Self-pay | Admitting: Family Medicine

## 2014-07-16 NOTE — Telephone Encounter (Signed)
?   OK to Refill  LOV 06/23/14 Last refill 02/17/14

## 2014-07-19 NOTE — Telephone Encounter (Signed)
Okay , give 2  

## 2014-07-19 NOTE — Telephone Encounter (Signed)
Prescription sent to pharmacy.

## 2014-07-29 ENCOUNTER — Telehealth: Payer: Self-pay | Admitting: *Deleted

## 2014-07-29 NOTE — Telephone Encounter (Signed)
Received fax requesting refill on zolpidem.   Ok to refill??  Last office visit 06/23/2014.  Last refill 02/17/2014.

## 2014-07-30 NOTE — Telephone Encounter (Signed)
Okay 

## 2014-08-02 DIAGNOSIS — H5210 Myopia, unspecified eye: Secondary | ICD-10-CM | POA: Diagnosis not present

## 2014-08-02 DIAGNOSIS — H5213 Myopia, bilateral: Secondary | ICD-10-CM | POA: Diagnosis not present

## 2014-08-02 LAB — HM DIABETES EYE EXAM

## 2014-08-02 MED ORDER — ZOLPIDEM TARTRATE 5 MG PO TABS: 5.0000 mg | ORAL_TABLET | Freq: Every evening | ORAL | Status: DC | PRN

## 2014-08-02 NOTE — Telephone Encounter (Signed)
Medication called to pharmacy. 

## 2014-08-24 ENCOUNTER — Telehealth: Payer: Self-pay | Admitting: Family Medicine

## 2014-08-24 ENCOUNTER — Encounter: Payer: Self-pay | Admitting: Family Medicine

## 2014-08-24 ENCOUNTER — Ambulatory Visit (INDEPENDENT_AMBULATORY_CARE_PROVIDER_SITE_OTHER): Payer: Commercial Managed Care - HMO | Admitting: Family Medicine

## 2014-08-24 VITALS — BP 136/72 | HR 78 | Temp 98.7°F | Resp 16 | Ht 65.0 in | Wt 328.0 lb

## 2014-08-24 DIAGNOSIS — R079 Chest pain, unspecified: Secondary | ICD-10-CM

## 2014-08-24 LAB — COMPREHENSIVE METABOLIC PANEL
ALT: 15 U/L (ref 0–35)
AST: 27 U/L (ref 0–37)
Albumin: 4.3 g/dL (ref 3.5–5.2)
Alkaline Phosphatase: 56 U/L (ref 39–117)
BUN: 16 mg/dL (ref 6–23)
CHLORIDE: 102 meq/L (ref 96–112)
CO2: 28 meq/L (ref 19–32)
CREATININE: 1.1 mg/dL (ref 0.50–1.10)
Calcium: 9.6 mg/dL (ref 8.4–10.5)
GLUCOSE: 126 mg/dL — AB (ref 70–99)
POTASSIUM: 3.9 meq/L (ref 3.5–5.3)
SODIUM: 145 meq/L (ref 135–145)
TOTAL PROTEIN: 7.2 g/dL (ref 6.0–8.3)
Total Bilirubin: 0.3 mg/dL (ref 0.2–1.2)

## 2014-08-24 LAB — CBC WITH DIFFERENTIAL/PLATELET
HCT: 33.6 % — ABNORMAL LOW (ref 36.0–46.0)
HEMOGLOBIN: 10.2 g/dL — AB (ref 12.0–15.0)
LYMPHS ABS: 1.9 10*3/uL (ref 0.7–4.0)
Lymphocytes Relative: 35 % (ref 12–46)
MCH: 23.9 pg — ABNORMAL LOW (ref 26.0–34.0)
MCHC: 30.4 g/dL (ref 30.0–36.0)
MCV: 78.6 fL (ref 78.0–100.0)
Monocytes Absolute: 0.3 10*3/uL (ref 0.1–1.0)
Monocytes Relative: 5 % (ref 3–12)
Neutro Abs: 3.2 10*3/uL (ref 1.7–7.7)
Neutrophils Relative %: 60 % (ref 43–77)
Platelets: 234 10*3/uL (ref 150–400)
RBC: 4.27 MIL/uL (ref 3.87–5.11)
RDW: 16.4 % — ABNORMAL HIGH (ref 11.5–15.5)
WBC: 5.4 10*3/uL (ref 4.0–10.5)

## 2014-08-24 NOTE — Patient Instructions (Signed)
Take 1/2 tablet of flexeril during day If no improvement you will be referred to heart doctor  F/U as previous

## 2014-08-24 NOTE — Telephone Encounter (Signed)
Returned call to patient.   Reports that she has some R sided muscle pain in chest. States that it feels like it's between her breasts.   Reports that muscle tightens and then loosens throughout the day, approximately 2-3 times per day. States that she can feel it through to her back.   Advised to schedule OV.   Appointment scheduled.

## 2014-08-24 NOTE — Progress Notes (Signed)
Patient ID: Autumn Carson, female   DOB: 05-Nov-1955, 59 y.o.   MRN: [id number]   Subjective:    Patient ID: Autumn Carson, female    DOB: 13-Jun-1956, 59 y.o.   MRN: [id number]  Patient presents for R sided chest pain/ Muscle spasms  patient here with right-sided spasms of her chest near her right breast she states that she eats to the middle of her back. This is been going on for the past 2 weeks on and off. No aggravating or particularly alleviating factors. It will typically last for about 10 seconds where she feels like something is grabbing her and tightening up and then it releases. She denies any shortness of breath and nausea diaphoresis associated. She does take her Flexeril every night and states that that helps ease it off but she has not tried taking it during the daytime. She denies any recent injury or any heavy lifting. She is status post cholecystectomy. There is no change with regards to her diet. Of note during that visit she had one of the episodes    Review Of Systems:  GEN- denies fatigue, fever, weight loss,weakness, recent illness HEENT- denies eye drainage, change in vision, nasal discharge, CVS- +chest pain,  Denies palpitations RESP- denies SOB, cough, wheeze ABD- denies N/V, change in stools, abd pain GU- denies dysuria, hematuria, dribbling, incontinence MSK- denies joint pain, muscle aches, injury Neuro- denies headache, dizziness, syncope, seizure activity       Objective:    BP 136/72 mmHg  Pulse 78  Temp(Src) 98.7 F (37.1 C) (Oral)  Resp 16  Ht 5\' 5"  (1.651 m)  Wt 328 lb (148.78 kg)  BMI 54.58 kg/m2  LMP 09/07/2011 GEN- NAD, alert and oriented x3 HEENT- PERRL, EOMI, non injected sclera, pink conjunctiva, MMM, oropharynx clear CVS- RRR,2/6 SEM  Chest wall- Non tender RESP-CTAB ABD-NABS,soft,NT,ND Msk- mild spasm noted during visit in right thoracic paraspinals ( no dyspnea noted, no diaphoresis, appeared comfortable) EXT- No  edema Pulses- Radial 2+   EKG- NSR, no ST changes     Assessment & Plan:      Problem List Items Addressed This Visit    None    Visit Diagnoses    Chest pain, unspecified chest pain type    -  Primary    Atypical CP, ? MSK spasm, does not sound like typical CAD or GI etiology, she does have risk factors for CAD with DM, HTN. Will have her try the flexeril during the day 5mg , to see if this relieves the pain in the next 24-48 hours, her EKG was reassuring, if episodes persist will get her set up with cardiology    Relevant Orders    EKG 12-Lead (Completed)    CBC with Differential/Platelet    Comprehensive metabolic panel       Note: This dictation was prepared with Dragon dictation along with smaller phrase technology. Any transcriptional errors that result from this process are unintentional.

## 2014-08-24 NOTE — Telephone Encounter (Signed)
Patient is calling to talk about some ongoing chest comfort she has been having  [phone]

## 2014-08-26 ENCOUNTER — Encounter: Payer: Self-pay | Admitting: Family Medicine

## 2014-09-03 ENCOUNTER — Telehealth: Payer: Self-pay | Admitting: *Deleted

## 2014-09-03 NOTE — Telephone Encounter (Signed)
Received fax from [facility] [employer] with authorization confirmation with authorization number [id number] from requestin provider Earnie Larsson, MD  Treating provider: [facility] imaging [facility]  Place of service: Imaging center  PCP: Vic Blackbird, MD  Type of Service: MRI  Procedure: [phone] lumbar spine w/o and w/dye  Dx:M48.06-Spinal Stenosis,lumbar region  Number of visits:1  Start Date: 09/02/14- end Date: 03/03/15  Per [employer] [facility] and Dr. Earnie Larsson also aware of authorization

## 2014-09-16 ENCOUNTER — Other Ambulatory Visit: Payer: Self-pay | Admitting: Family Medicine

## 2014-09-16 NOTE — Telephone Encounter (Signed)
Refill appropriate and filled per protocol. 

## 2014-09-17 ENCOUNTER — Other Ambulatory Visit: Payer: Self-pay | Admitting: Family Medicine

## 2014-09-17 DIAGNOSIS — Z1231 Encounter for screening mammogram for malignant neoplasm of breast: Secondary | ICD-10-CM

## 2014-09-21 ENCOUNTER — Other Ambulatory Visit: Payer: Self-pay | Admitting: Neurosurgery

## 2014-09-21 DIAGNOSIS — M48061 Spinal stenosis, lumbar region without neurogenic claudication: Secondary | ICD-10-CM

## 2014-09-23 ENCOUNTER — Ambulatory Visit (HOSPITAL_COMMUNITY)
Admission: RE | Admit: 2014-09-23 | Discharge: 2014-09-23 | Disposition: A | Discharge status: Home / Self Care | Payer: Commercial Managed Care - HMO | Source: Ambulatory Visit | Attending: Family Medicine | Admitting: Family Medicine

## 2014-09-23 DIAGNOSIS — Z1231 Encounter for screening mammogram for malignant neoplasm of breast: Secondary | ICD-10-CM | POA: Diagnosis not present

## 2014-09-24 ENCOUNTER — Ambulatory Visit: Payer: Medicare HMO | Admitting: Family Medicine

## 2014-09-30 ENCOUNTER — Encounter: Payer: Self-pay | Admitting: Family Medicine

## 2014-10-08 ENCOUNTER — Ambulatory Visit
Admission: RE | Admit: 2014-10-08 | Discharge: 2014-10-08 | Disposition: A | Discharge status: Home / Self Care | Payer: Commercial Managed Care - HMO | Source: Ambulatory Visit | Attending: Neurosurgery | Admitting: Neurosurgery

## 2014-10-08 DIAGNOSIS — M4326 Fusion of spine, lumbar region: Secondary | ICD-10-CM | POA: Diagnosis not present

## 2014-10-08 DIAGNOSIS — M48061 Spinal stenosis, lumbar region without neurogenic claudication: Secondary | ICD-10-CM

## 2014-10-08 DIAGNOSIS — M5126 Other intervertebral disc displacement, lumbar region: Secondary | ICD-10-CM | POA: Diagnosis not present

## 2014-10-08 DIAGNOSIS — M47817 Spondylosis without myelopathy or radiculopathy, lumbosacral region: Secondary | ICD-10-CM | POA: Diagnosis not present

## 2014-10-08 MED ORDER — GADOBENATE DIMEGLUMINE 529 MG/ML IV SOLN
20.0000 mL | Freq: Once | INTRAVENOUS | Status: AC | PRN
  Administered 2014-10-08: 20 mL via INTRAVENOUS

## 2014-10-11 ENCOUNTER — Ambulatory Visit: Payer: Commercial Managed Care - HMO | Admitting: Family Medicine

## 2014-10-11 ENCOUNTER — Telehealth: Payer: Self-pay | Admitting: Family Medicine

## 2014-10-11 NOTE — Telephone Encounter (Signed)
[phone]  PT is wanting something for chest cold everything she is using won't work. She states she doesn't thinks she needs to come in (unless dr Buelah Manis sees that she needs to)  is there anything she can get at pharmacy or something that can be called in. Has a dry cough if she lays down flat she starts to cough more. She has high blood pressure and is afraid to take certain things.   [facility]

## 2014-10-11 NOTE — Telephone Encounter (Signed)
Pt believes she has flu or PNA since last week and has tried keeping juice down, has taken OTC cough suppressant, mucinex. States she is not better today. Pt coming in today for office visit.

## 2014-10-15 ENCOUNTER — Telehealth: Payer: Self-pay | Admitting: *Deleted

## 2014-10-15 NOTE — Telephone Encounter (Signed)
Submitted [employer] referral thru acuity connect for authorization to Dr. Adair Patter with authorization number [id number]  Requesting provider: Vic Blackbird, MD  Treating provider: [name] Heck, MD  Number of visits: 6  Start Date:10/19/14  End Date:04/17/2015  Dx: C73-Malignant neoplasm of thyroid gland  Copy has been faxed to Dr. Buddy Duty office and also verbally aware via vm

## 2014-10-20 DIAGNOSIS — C73 Malignant neoplasm of thyroid gland: Secondary | ICD-10-CM | POA: Diagnosis not present

## 2014-10-20 DIAGNOSIS — E890 Postprocedural hypothyroidism: Secondary | ICD-10-CM | POA: Diagnosis not present

## 2014-10-21 ENCOUNTER — Other Ambulatory Visit: Payer: Self-pay | Admitting: Family Medicine

## 2014-10-21 NOTE — Telephone Encounter (Signed)
Refill appropriate and filled per protocol. 

## 2014-10-27 DIAGNOSIS — Z85850 Personal history of malignant neoplasm of thyroid: Secondary | ICD-10-CM | POA: Diagnosis not present

## 2014-10-27 DIAGNOSIS — E119 Type 2 diabetes mellitus without complications: Secondary | ICD-10-CM | POA: Diagnosis not present

## 2014-10-27 DIAGNOSIS — Z23 Encounter for immunization: Secondary | ICD-10-CM | POA: Diagnosis not present

## 2014-10-27 DIAGNOSIS — E890 Postprocedural hypothyroidism: Secondary | ICD-10-CM | POA: Diagnosis not present

## 2014-10-27 DIAGNOSIS — Z6841 Body Mass Index (BMI) 40.0 and over, adult: Secondary | ICD-10-CM | POA: Diagnosis not present

## 2014-11-08 ENCOUNTER — Other Ambulatory Visit: Payer: Self-pay | Admitting: Family Medicine

## 2014-11-09 NOTE — Telephone Encounter (Signed)
Okay to refill give 3 

## 2014-11-09 NOTE — Telephone Encounter (Signed)
Ok to refill??  Last office visit 08/24/2014.  Last refill 08/02/2014, #15 tabs.

## 2014-11-09 NOTE — Telephone Encounter (Signed)
Medication called to pharmacy. 

## 2014-12-08 DIAGNOSIS — Z23 Encounter for immunization: Secondary | ICD-10-CM | POA: Diagnosis not present

## 2014-12-14 ENCOUNTER — Encounter: Payer: Self-pay | Admitting: Family Medicine

## 2014-12-14 ENCOUNTER — Other Ambulatory Visit: Payer: Self-pay | Admitting: Family Medicine

## 2014-12-14 NOTE — Telephone Encounter (Signed)
Medication refill for one time only.  Patient needs to be seen.  Letter sent for patient to call and schedule 

## 2015-01-04 ENCOUNTER — Encounter: Payer: Self-pay | Admitting: Family Medicine

## 2015-01-11 DIAGNOSIS — E890 Postprocedural hypothyroidism: Secondary | ICD-10-CM | POA: Diagnosis not present

## 2015-01-12 ENCOUNTER — Telehealth: Payer: Self-pay | Admitting: Family Medicine

## 2015-01-12 MED ORDER — MECLIZINE HCL 25 MG PO TABS: 25.0000 mg | ORAL_TABLET | Freq: Three times a day (TID) | ORAL | Status: DC | PRN

## 2015-01-12 NOTE — Telephone Encounter (Signed)
Send in Meclizine 25mg  po TID prn vertigo #30  R 0

## 2015-01-12 NOTE — Telephone Encounter (Signed)
Prescription sent to pharmacy. .   Call placed to patient and patient made aware.  

## 2015-01-12 NOTE — Telephone Encounter (Signed)
Call placed to patient.   Reports that she has had severe case of vertigo today. Reports that she has been diagnosed with vertigo in the past, but there was no clear resolution as to the cause then.   Reports that she becomes extremely dizzy and it feels as if her head is spinning when she moves. States that the only thing that has ever helped her is a motion sickness medication, but she cannot remember the name of the medication.   Reports that she does have appointment for Tuesday, 01/18/2015. Advised that if symptoms worsen to go to UC/ER or schedule appointment sooner.   MD please advise.

## 2015-01-12 NOTE — Telephone Encounter (Signed)
Patient is calling to speak with you regarding some severe vertigo she is having and not being able to get out of the bed  [phone]

## 2015-01-18 ENCOUNTER — Encounter: Payer: Self-pay | Admitting: Family Medicine

## 2015-01-18 ENCOUNTER — Ambulatory Visit (INDEPENDENT_AMBULATORY_CARE_PROVIDER_SITE_OTHER): Payer: Commercial Managed Care - HMO | Admitting: Family Medicine

## 2015-01-18 VITALS — BP 138/86 | HR 80 | Temp 98.5°F | Resp 14 | Ht 65.0 in | Wt 323.0 lb

## 2015-01-18 DIAGNOSIS — I10 Essential (primary) hypertension: Secondary | ICD-10-CM

## 2015-01-18 DIAGNOSIS — E785 Hyperlipidemia, unspecified: Secondary | ICD-10-CM

## 2015-01-18 DIAGNOSIS — E1143 Type 2 diabetes mellitus with diabetic autonomic (poly)neuropathy: Secondary | ICD-10-CM | POA: Diagnosis not present

## 2015-01-18 DIAGNOSIS — G990 Autonomic neuropathy in diseases classified elsewhere: Secondary | ICD-10-CM | POA: Diagnosis not present

## 2015-01-18 DIAGNOSIS — Z0000 Encounter for general adult medical examination without abnormal findings: Secondary | ICD-10-CM | POA: Diagnosis not present

## 2015-01-18 DIAGNOSIS — F418 Other specified anxiety disorders: Secondary | ICD-10-CM

## 2015-01-18 LAB — COMPREHENSIVE METABOLIC PANEL
ALK PHOS: 72 U/L (ref 39–117)
ALT: 10 U/L (ref 0–35)
AST: 13 U/L (ref 0–37)
Albumin: 4.2 g/dL (ref 3.5–5.2)
BUN: 13 mg/dL (ref 6–23)
CO2: 30 meq/L (ref 19–32)
Calcium: 9.4 mg/dL (ref 8.4–10.5)
Chloride: 103 mEq/L (ref 96–112)
Creat: 1.01 mg/dL (ref 0.50–1.10)
Glucose, Bld: 113 mg/dL — ABNORMAL HIGH (ref 70–99)
Potassium: 4.3 mEq/L (ref 3.5–5.3)
Sodium: 144 mEq/L (ref 135–145)
Total Bilirubin: 0.4 mg/dL (ref 0.2–1.2)
Total Protein: 6.9 g/dL (ref 6.0–8.3)

## 2015-01-18 LAB — LIPID PANEL
Cholesterol: 169 mg/dL (ref 0–200)
HDL: 41 mg/dL — ABNORMAL LOW (ref >=46)
LDL CALC: 99 mg/dL (ref 0–99)
Total CHOL/HDL Ratio: 4.1 Ratio
Triglycerides: 143 mg/dL (ref <150)
VLDL: 29 mg/dL (ref 0–40)

## 2015-01-18 LAB — CBC WITH DIFFERENTIAL/PLATELET
BASOS ABS: 0 10*3/uL (ref 0.0–0.1)
Basophils Relative: 0 % (ref 0–1)
Eosinophils Absolute: 0.1 10*3/uL (ref 0.0–0.7)
Eosinophils Relative: 3 % (ref 0–5)
HEMATOCRIT: 33.8 % — AB (ref 36.0–46.0)
HEMOGLOBIN: 10.6 g/dL — AB (ref 12.0–15.0)
LYMPHS PCT: 34 % (ref 12–46)
Lymphs Abs: 1.5 10*3/uL (ref 0.7–4.0)
MCH: 23.9 pg — ABNORMAL LOW (ref 26.0–34.0)
MCHC: 31.4 g/dL (ref 30.0–36.0)
MCV: 76.3 fL — AB (ref 78.0–100.0)
MONO ABS: 0.4 10*3/uL (ref 0.1–1.0)
MPV: 10.3 fL (ref 8.6–12.4)
Monocytes Relative: 9 % (ref 3–12)
NEUTROS ABS: 2.4 10*3/uL (ref 1.7–7.7)
Neutrophils Relative %: 54 % (ref 43–77)
Platelets: 251 10*3/uL (ref 150–400)
RBC: 4.43 MIL/uL (ref 3.87–5.11)
RDW: 17.3 % — ABNORMAL HIGH (ref 11.5–15.5)
WBC: 4.4 10*3/uL (ref 4.0–10.5)

## 2015-01-18 MED ORDER — TETANUS-DIPHTH-ACELL PERTUSSIS 5-2.5-18.5 LF-MCG/0.5 IM SUSP: 0.5000 mL | Freq: Once | INTRAMUSCULAR | Status: DC

## 2015-01-18 NOTE — Progress Notes (Signed)
Subjective:   Patient presents for Medicare Annual/Subsequent preventive examination.   Pt here for annual wellness exam, had severe bout of vertigo this past week, which has now resolved with meclizine. No issues leading up to it, came out of nowhere per report. DM- blood sugars fasting < 130, no hypoglycemia, trying to watch her diet. Walking is more of an issue because of chronic back and knee pain HTN- taking BP meds, monitors at home REcent changes in thyroid medication by Dr. Buddy Duty Review Past Medical/Family/Social: Per EMR    Risk Factors  Current exercise habits: minimal aerobic exercise Dietary issues discussed: yes  Cardiac risk factors: Obesity (BMI >= 30 kg/m2). HTN, DM  Depression Screen  (Note: if answer to either of the following is "Yes", a more complete depression screening is indicated)  Over the past two weeks, have you felt down, depressed or hopeless? yes Over the past two weeks, have you felt little interest or pleasure in doing things? No Have you lost interest or pleasure in daily life? No Do you often feel hopeless? yes Do you cry easily over simple problems? No   Activities of Daily Living  In your present state of health, do you have any difficulty performing the following activities?:  Driving? No  Managing money? No  Feeding yourself? No  Getting from bed to chair? No  Climbing a flight of stairs? yes Preparing food and eating?: No  Bathing or showering? No  Getting dressed: No  Getting to the toilet? No  Using the toilet:No  Moving around from place to place: yes, after sitting for a whule  In the past year have you fallen or had a near fall?:yes- twice  Are you sexually active? yes Do you have more than one partner? No   Hearing Difficulties: No  Do you often ask people to speak up or repeat themselves? No  Do you experience ringing or noises in your ears? No Do you have difficulty understanding soft or whispered voices? No  Do you feel that  you have a problem with memory? No Do you often misplace items? yes  Do you feel safe at home? Yes  Cognitive Testing  Alert? Yes Normal Appearance?Yes  Oriented to person? Yes Place? Yes  Time? Yes  Recall of three objects? Yes  Can perform simple calculations? Yes  Displays appropriate judgment?Yes  Can read the correct time from a watch face?Yes   List the Names of Other Physician/Practitioners you currently use: Dr. Trenton Gammon, Dr. Buddy Duty    Screening Tests / Date Colonoscopy     -UTD                Zostavax - age  59 Mammogram - UTD Influenza Vaccine  Tetanus/tdap- Overdue  ROS: GEN- denies fatigue, fever, weight loss,weakness, recent illness HEENT- denies eye drainage, change in vision, nasal discharge, CVS- denies chest pain, palpitations RESP- denies SOB, cough, wheeze ABD- denies N/V, change in stools, abd pain GU- denies dysuria, hematuria, dribbling, incontinence MSK- + joint pain, muscle aches, injury Neuro- denies headache, +dizziness, syncope, seizure activity   PHYSICAL: GEN- NAD, alert and oriented x3 HEENT- PERRL, EOMI, non injected sclera, pink conjunctiva, MMM, oropharynx clear Neck- Supple,  CVS- RRR, 2/6SEM RESP-CTAB ABS-NABS,soft,NT,ND Psych- normal affect and mood, well groomed, normal speech EXT- trace pedal  edema Pulses- Radial, DP- 2+    Assessment:    Annual wellness medicare exam   Plan:    During the course of the visit the patient was educated  and counseled about appropriate screening and preventive services including:    Screen + for depression but pt has history of chronic depressioncontinue current meds.  TDAP sent to pharmacy- will see if able to afford .  Diet review for nutrition referral? Yes ____ Not Indicated __x__  Patient Instructions (the written plan) was given to the patient.  Medicare Attestation  I have personally reviewed:  The patient's medical and social history  Their use of alcohol, tobacco or illicit drugs   Their current medications and supplements  The patient's functional ability including ADLs,fall risks, home safety risks, cognitive, and hearing and visual impairment  Diet and physical activities  Evidence for depression or mood disorders  The patient's weight, height, BMI, and visual acuity have been recorded in the chart. I have made referrals, counseling, and provided education to the patient based on review of the above and I have provided the patient with a written personalized care plan for preventive services.

## 2015-01-18 NOTE — Assessment & Plan Note (Signed)
Continue gabapentin,

## 2015-01-18 NOTE — Patient Instructions (Signed)
Continue current medications Tetanus Booster sent to pharmacy F/U 4 months

## 2015-01-18 NOTE — Assessment & Plan Note (Signed)
Doing well on prozac.   

## 2015-01-18 NOTE — Assessment & Plan Note (Signed)
Recheck A1C, has been well controlled, continue MTF, on ACEI and statin, asa

## 2015-01-19 ENCOUNTER — Other Ambulatory Visit: Payer: Self-pay | Admitting: Family Medicine

## 2015-01-19 LAB — HEMOGLOBIN A1C
HEMOGLOBIN A1C: 6.8 % — AB (ref <5.7)
MEAN PLASMA GLUCOSE: 148 mg/dL — AB (ref <117)

## 2015-01-19 NOTE — Telephone Encounter (Signed)
Medication refilled per protocol. 

## 2015-02-09 ENCOUNTER — Other Ambulatory Visit: Payer: Self-pay | Admitting: Family Medicine

## 2015-02-09 NOTE — Telephone Encounter (Signed)
Medication refilled per protocol. 

## 2015-02-18 ENCOUNTER — Telehealth: Payer: Self-pay | Admitting: Family Medicine

## 2015-02-18 NOTE — Telephone Encounter (Signed)
Patient is calling

## 2015-02-25 DIAGNOSIS — E890 Postprocedural hypothyroidism: Secondary | ICD-10-CM | POA: Diagnosis not present

## 2015-03-29 ENCOUNTER — Other Ambulatory Visit: Payer: Self-pay | Admitting: Family Medicine

## 2015-03-29 NOTE — Telephone Encounter (Signed)
Refill appropriate and filled per protocol. 

## 2015-04-15 ENCOUNTER — Telehealth: Payer: Self-pay | Admitting: *Deleted

## 2015-04-15 MED ORDER — DICLOFENAC SODIUM 1 % TD GEL: 2.0000 g | Freq: Four times a day (QID) | TRANSDERMAL | Status: DC

## 2015-04-15 NOTE — Telephone Encounter (Signed)
Received fax from [facility] [name] [facility].   Requested prescription for compounded pain cream.   Call placed to patient who states that she did request cream, but requested Voltaren to be sent to retail pharmacy.   MD made aware and approved order for Voltaren Gel.   Prescription sent to pharmacy.   Denied request from [facility].

## 2015-04-25 ENCOUNTER — Other Ambulatory Visit: Payer: Self-pay | Admitting: Family Medicine

## 2015-04-25 NOTE — Telephone Encounter (Signed)
Refill appropriate and filled per protocol. 

## 2015-04-26 ENCOUNTER — Other Ambulatory Visit: Payer: Self-pay | Admitting: Family Medicine

## 2015-04-26 NOTE — Telephone Encounter (Signed)
Medication refilled per protocol. 

## 2015-05-02 ENCOUNTER — Telehealth: Payer: Self-pay | Admitting: *Deleted

## 2015-05-02 NOTE — Telephone Encounter (Signed)
Submitted humana referral thru acuity connect for authorization on 04/29/15 to Dr. Earnie Larsson, MD with authorization [id number]  Requesting provider: Neysa Hotter  Treating provider: Altamese Cabal  Number of visits:6  Start Date:05/03/15  End Date: 10/30/15  Dx:M48.06-Spinal Stenosis,lumbar region

## 2015-05-03 DIAGNOSIS — M4806 Spinal stenosis, lumbar region: Secondary | ICD-10-CM | POA: Diagnosis not present

## 2015-05-03 DIAGNOSIS — Z6841 Body Mass Index (BMI) 40.0 and over, adult: Secondary | ICD-10-CM | POA: Diagnosis not present

## 2015-05-03 DIAGNOSIS — I10 Essential (primary) hypertension: Secondary | ICD-10-CM | POA: Diagnosis not present

## 2015-05-10 DIAGNOSIS — R262 Difficulty in walking, not elsewhere classified: Secondary | ICD-10-CM | POA: Diagnosis not present

## 2015-05-10 DIAGNOSIS — M545 Low back pain: Secondary | ICD-10-CM | POA: Diagnosis not present

## 2015-05-10 DIAGNOSIS — M79605 Pain in left leg: Secondary | ICD-10-CM | POA: Diagnosis not present

## 2015-05-10 DIAGNOSIS — M6281 Muscle weakness (generalized): Secondary | ICD-10-CM | POA: Diagnosis not present

## 2015-05-11 ENCOUNTER — Telehealth: Payer: Self-pay | Admitting: Family Medicine

## 2015-05-11 NOTE — Telephone Encounter (Signed)
Autumn Carson from [facility] is calling for a referral for the patient to have an epidural steroid. You can reach her @ [phone] ext: [phone]

## 2015-05-11 NOTE — Telephone Encounter (Signed)
done

## 2015-05-20 ENCOUNTER — Ambulatory Visit: Payer: Commercial Managed Care - HMO | Admitting: Family Medicine

## 2015-07-01 ENCOUNTER — Other Ambulatory Visit: Payer: Self-pay | Admitting: Family Medicine

## 2015-07-01 NOTE — Telephone Encounter (Signed)
Medication filled x1 with no refills.   Requires office visit before any further refills can be given.   Letter sent.  

## 2015-07-19 ENCOUNTER — Encounter: Payer: Self-pay | Admitting: Family Medicine

## 2015-07-19 ENCOUNTER — Ambulatory Visit (INDEPENDENT_AMBULATORY_CARE_PROVIDER_SITE_OTHER): Payer: Commercial Managed Care - HMO | Admitting: Family Medicine

## 2015-07-19 VITALS — BP 130/78 | HR 82 | Temp 98.5°F | Resp 14 | Ht 65.0 in | Wt 328.0 lb

## 2015-07-19 DIAGNOSIS — G990 Autonomic neuropathy in diseases classified elsewhere: Secondary | ICD-10-CM | POA: Diagnosis not present

## 2015-07-19 DIAGNOSIS — I10 Essential (primary) hypertension: Secondary | ICD-10-CM | POA: Diagnosis not present

## 2015-07-19 DIAGNOSIS — E1143 Type 2 diabetes mellitus with diabetic autonomic (poly)neuropathy: Secondary | ICD-10-CM | POA: Diagnosis not present

## 2015-07-19 DIAGNOSIS — R0789 Other chest pain: Secondary | ICD-10-CM

## 2015-07-19 DIAGNOSIS — E785 Hyperlipidemia, unspecified: Secondary | ICD-10-CM | POA: Diagnosis not present

## 2015-07-19 LAB — CBC WITH DIFFERENTIAL/PLATELET
Basophils Absolute: 0 10*3/uL (ref 0.0–0.1)
Basophils Relative: 1 % (ref 0–1)
EOS ABS: 0.2 10*3/uL (ref 0.0–0.7)
EOS PCT: 4 % (ref 0–5)
HEMATOCRIT: 35.1 % — AB (ref 36.0–46.0)
HEMOGLOBIN: 10.9 g/dL — AB (ref 12.0–15.0)
LYMPHS PCT: 29 % (ref 12–46)
Lymphs Abs: 1.2 10*3/uL (ref 0.7–4.0)
MCH: 24.2 pg — AB (ref 26.0–34.0)
MCHC: 31.1 g/dL (ref 30.0–36.0)
MCV: 77.8 fL — AB (ref 78.0–100.0)
MONO ABS: 0.3 10*3/uL (ref 0.1–1.0)
MPV: 10.6 fL (ref 8.6–12.4)
Monocytes Relative: 8 % (ref 3–12)
Neutro Abs: 2.3 10*3/uL (ref 1.7–7.7)
Neutrophils Relative %: 58 % (ref 43–77)
Platelets: 299 10*3/uL (ref 150–400)
RBC: 4.51 MIL/uL (ref 3.87–5.11)
RDW: 16.8 % — ABNORMAL HIGH (ref 11.5–15.5)
WBC: 4 10*3/uL (ref 4.0–10.5)

## 2015-07-19 LAB — LIPID PANEL
CHOLESTEROL: 184 mg/dL (ref 125–200)
HDL: 49 mg/dL (ref >=46)
LDL Cholesterol: 108 mg/dL (ref <130)
Total CHOL/HDL Ratio: 3.8 Ratio (ref <=5.0)
Triglycerides: 134 mg/dL (ref <150)
VLDL: 27 mg/dL (ref <30)

## 2015-07-19 LAB — COMPREHENSIVE METABOLIC PANEL
ALBUMIN: 4.6 g/dL (ref 3.6–5.1)
ALT: 12 U/L (ref 6–29)
AST: 16 U/L (ref 10–35)
Alkaline Phosphatase: 64 U/L (ref 33–130)
BUN: 12 mg/dL (ref 7–25)
CO2: 26 mmol/L (ref 20–31)
CREATININE: 1.11 mg/dL — AB (ref 0.50–1.05)
Calcium: 9 mg/dL (ref 8.6–10.4)
Chloride: 102 mmol/L (ref 98–110)
Glucose, Bld: 116 mg/dL — ABNORMAL HIGH (ref 70–99)
POTASSIUM: 4.2 mmol/L (ref 3.5–5.3)
SODIUM: 139 mmol/L (ref 135–146)
TOTAL PROTEIN: 7.1 g/dL (ref 6.1–8.1)
Total Bilirubin: 0.3 mg/dL (ref 0.2–1.2)

## 2015-07-19 LAB — HEMOGLOBIN A1C
HEMOGLOBIN A1C: 7.1 % — AB (ref <5.7)
Mean Plasma Glucose: 157 mg/dL — ABNORMAL HIGH (ref <117)

## 2015-07-19 MED ORDER — SIMVASTATIN 40 MG PO TABS: 40.0000 mg | ORAL_TABLET | Freq: Every day | ORAL | Status: DC

## 2015-07-19 MED ORDER — METFORMIN HCL 1000 MG PO TABS: ORAL_TABLET | ORAL | Status: DC

## 2015-07-19 MED ORDER — CYCLOBENZAPRINE HCL 10 MG PO TABS: ORAL_TABLET | ORAL | Status: DC

## 2015-07-19 NOTE — Assessment & Plan Note (Signed)
Discussed how weight loss will help OA

## 2015-07-19 NOTE — Assessment & Plan Note (Signed)
Well controlled, no change to meds 

## 2015-07-19 NOTE — Assessment & Plan Note (Signed)
Check A1C, well controlled, no change to meds

## 2015-07-19 NOTE — Assessment & Plan Note (Signed)
Her chest pain sounds musculoskeletal but she continues to have recurrent episodes despite treatment for muscles and Reflux. She has risk factors for heart disease. Clear she is having some other type of spasm. On medicine or cardiology for evaluation

## 2015-07-19 NOTE — Progress Notes (Signed)
Patient ID: Autumn Carson, female   DOB: 04-19-1956, 59 y.o.   MRN: [id number]   Subjective:    Patient ID: Autumn Carson, female    DOB: 10/29/1955, 60 y.o.   MRN: [id number]  Patient presents for 4 month F/U and Chest Wall Pain  a shear with recurrent chest pain. She was evaluated back in January after she was having severe spasms on the right side of her chest. Now it is more in the center and it feels deep and high in her breasts. She states it will be a squeezing sensation and then it just releases. We have treated with muscle relaxants she is on antireflux medication none of these have helped. There is nothing particular that brings it on but she does states it is worse at night. She does have risk factors for heart disease.  Diabetes mellitus her last A1c was 6.8% her CBGs have been around 120 or less she did have one elevated CBG of 156 after eating something.  She does not want any surgical intervention for her osteoporosis of her knees. At this point she is using topical rubs to try to maintain.    Review Of Systems:  GEN- denies fatigue, fever, weight loss,weakness, recent illness HEENT- denies eye drainage, change in vision, nasal discharge, CVS- +chest pain, palpitations RESP- denies SOB, cough, wheeze ABD- denies N/V, change in stools, abd pain GU- denies dysuria, hematuria, dribbling, incontinence MSK- + joint pain, muscle aches, injury Neuro- denies headache, dizziness, syncope, seizure activity       Objective:    BP 130/78 mmHg  Pulse 82  Temp(Src) 98.5 F (36.9 C) (Oral)  Resp 14  Ht 5\' 5"  (1.651 m)  Wt 328 lb (148.78 kg)  BMI 54.58 kg/m2  LMP 09/07/2011 GEN- NAD, alert and oriented x3 HEENT- PERRL, EOMI, non injected sclera, pink conjunctiva, MMM, oropharynx clear Neck- Supple,  CVS- RRR, 2/6SEM RESP-CTAB ABS-NABS,soft,NT,ND EXT- trace pedal  edema Pulses- Radial, DP- 2+      Assessment & Plan:      Problem List Items Addressed  This Visit    Morbid obesity ([place])    Discussed how weight loss will help OA      Hyperlipidemia   Relevant Orders   Lipid panel   Essential hypertension    Well controlled, no change to meds      Diabetes mellitus, type II (HCC) - Primary    Check A1C, well controlled, no change to meds      Relevant Orders   CBC with Differential/Platelet   Comprehensive metabolic panel   Hemoglobin A1c   Microalbumin / creatinine urine ratio   Atypical chest pain    Her chest pain sounds musculoskeletal but she continues to have recurrent episodes despite treatment for muscles and Reflux. She has risk factors for heart disease. Clear she is having some other type of spasm. On medicine or cardiology for evaluation         Note: This dictation was prepared with Dragon dictation along with smaller phrase technology. Any transcriptional errors that result from this process are unintentional.

## 2015-07-19 NOTE — Patient Instructions (Signed)
Continue current medications Referral to cardiology F/U 4 months

## 2015-07-20 LAB — MICROALBUMIN / CREATININE URINE RATIO
Creatinine, Urine: 33 mg/dL (ref 20–320)
Microalb Creat Ratio: 164 mcg/mg creat — ABNORMAL HIGH (ref <30)
Microalb, Ur: 5.4 mg/dL

## 2015-07-25 ENCOUNTER — Other Ambulatory Visit: Payer: Self-pay | Admitting: Family Medicine

## 2015-07-26 NOTE — Telephone Encounter (Signed)
Refill appropriate and filled per protocol. 

## 2015-08-02 ENCOUNTER — Encounter: Payer: Self-pay | Admitting: Cardiology

## 2015-08-03 ENCOUNTER — Ambulatory Visit (INDEPENDENT_AMBULATORY_CARE_PROVIDER_SITE_OTHER): Payer: Commercial Managed Care - HMO | Admitting: Cardiology

## 2015-08-03 ENCOUNTER — Telehealth: Payer: Self-pay | Admitting: *Deleted

## 2015-08-03 ENCOUNTER — Encounter: Payer: Self-pay | Admitting: Cardiology

## 2015-08-03 VITALS — BP 122/74 | HR 100 | Ht 66.0 in | Wt 323.8 lb

## 2015-08-03 DIAGNOSIS — I10 Essential (primary) hypertension: Secondary | ICD-10-CM | POA: Diagnosis not present

## 2015-08-03 DIAGNOSIS — E785 Hyperlipidemia, unspecified: Secondary | ICD-10-CM

## 2015-08-03 DIAGNOSIS — R072 Precordial pain: Secondary | ICD-10-CM

## 2015-08-03 DIAGNOSIS — E1140 Type 2 diabetes mellitus with diabetic neuropathy, unspecified: Secondary | ICD-10-CM

## 2015-08-03 NOTE — Patient Instructions (Signed)
Your physician recommends that you schedule a follow-up appointment in:  To be determined after test    Your physician has requested that you have en exercise stress myoview. For further information please visit [email or web address]. Please follow instruction sheet, as given. This is a 2 day test  HOLD  Toprol the DAY of test      Thank you for choosing [facility] !

## 2015-08-03 NOTE — Progress Notes (Signed)
Cardiology Office Note  Date: 08/03/2015   ID: SHEVELLE HALBERG, DOB Oct 07, 1955, MRN [id number]  PCP: Vic Blackbird, MD  Consulting Cardiologist: Rozann Lesches, MD   Chief Complaint  Patient presents with  . Chest Pain    History of Present Illness: Autumn Carson is a 60 y.o. female referred for cardiology consultation by Dr. Buelah Manis. She reports a six-month history of a squeezing discomfort in her central chest area, radiates to the back. This is sporadic, feels like a hand squeezing and then releasing over a period of a few minutes. Symptoms are mild but sometimes of moderately intense degree. She cannot correlate this with any specific activity or time of the day. She has not had any palpitations or syncope associated with it. She does state that she occasionally has dysphagia to solid foods, but not always, and reports that her reflux is reasonably well controlled.  Cardiac risk factors include type 2 diabetes mellitus with neuropathy, hypertension, and mild hyperlipidemia on statin therapy. She states that she underwent a stress test several years ago, cannot recall any abnormalities. She was not having the present symptoms at that time.  ECG today shows normal sinus rhythm with low voltage.  I reviewed her most recent lab work from December which is outlined below.  She currently works as a Programmer, systems on the weekends.  Past Medical History  Diagnosis Date  . Type 2 diabetes mellitus with diabetic neuropathy ([place])   . Essential hypertension   . Obesity   . History of thyroid cancer 2011    Papillary thyroid cancer s/p radiation  . Chronic anemia   . GERD (gastroesophageal reflux disease)   . Back pain   . Hypothyroidism   . Anxiety   . Seasonal allergies   . Lumbar spinal stenosis   . Arthritis of both knees     Past Surgical History  Procedure Laterality Date  . Cesarean section    . Tubal ligation    . Tonsilectomy, adenoidectomy,  bilateral myringotomy and tubes  1987  . Carpal tunnel release      4 total, bilaterally  . Rotator cuff repair Bilateral     bilaterally  . Total thyroidectomy  2011  . Elbow surgery Bilateral   . Colonoscopy with esophagogastroduodenoscopy (egd) N/A 12/08/2012    Procedure: COLONOSCOPY WITH ESOPHAGOGASTRODUODENOSCOPY (EGD);  Surgeon: Danie Binder, MD;  [facility];  Service: Endoscopy;  Laterality: N/A;  9:45  . Givens capsule study N/A 12/31/2012    Procedure: GIVENS CAPSULE STUDY;  Surgeon: Danie Binder, MD;  Location: [facility];  Service: Endoscopy;  Laterality: N/A;  7:30  . Enteroscopy N/A 01/16/2013    Procedure: ENTEROSCOPY;  Surgeon: Danie Binder, MD;  [facility]: [facility];  Service: Endoscopy;  Laterality: N/A;  2:00  . Esophageal biopsy N/A 01/16/2013    Procedure: BIOPSY;  Surgeon: Danie Binder, MD;  Location: [facility];  Service: Endoscopy;  Laterality: N/A;  SMALL BOWEL ULCERS  . Cholecystectomy  1990's  . Spine surgery      Lumbar Fusion- Dr. Trenton Gammon    Current Outpatient Prescriptions  Medication Sig Dispense Refill  . ACCU-CHEK FASTCLIX LANCETS MISC 1 Units by Does not apply route daily. 100 each 3  . albuterol (PROVENTIL HFA;VENTOLIN HFA) 108 (90 BASE) MCG/ACT inhaler Inhale 2 puffs into the lungs every 6 (six) hours as needed for wheezing. 1 Inhaler 2  . aspirin 81 MG tablet Take 81 mg  by mouth every other day.    . cyclobenzaprine (FLEXERIL) 10 MG tablet TAKE 1 TABLET BY MOUTH EVERY 8 HOURS AS NEEDED FOR MUSCLE SPASMS 45 tablet 2  . diclofenac sodium (VOLTAREN) 1 % GEL Apply 2 g topically 4 (four) times daily. 100 g 3  . FLUoxetine (PROZAC) 40 MG capsule TAKE 1 CAPSULE EVERY DAY BEFORE BREAKFAST 90 capsule 0  . fluticasone (FLONASE) 50 MCG/ACT nasal spray PLACE 2 SPRAYS INTO BOTH NOSTRILS DAILY. 48 g 3  . furosemide (LASIX) 20 MG tablet TAKE 1 TABLET EVERY DAY BEFORE BREAKFAST 90 tablet 2  . gabapentin (NEURONTIN) 300 MG capsule TAKE 1  CAPSULE TWICE DAILY 180 capsule 1  . glucose blood test strip Use as instructed [place] for Accuchek nano 100 each 3  . lisinopril (PRINIVIL,ZESTRIL) 40 MG tablet TAKE 1 TABLET EVERY DAY 90 tablet 0  . meclizine (ANTIVERT) 25 MG tablet Take 1 tablet (25 mg total) by mouth 3 (three) times daily as needed for dizziness. 30 tablet 0  . metFORMIN (GLUCOPHAGE) 1000 MG tablet TAKE 1 TABLET TWICE DAILY EVERY MORNING AND EVENING 180 tablet 1  . metoprolol succinate (TOPROL-XL) 25 MG 24 hr tablet TAKE 1 TABLET EVERY DAY 90 tablet 1  . naproxen (NAPROSYN) 500 MG tablet TAKE 1 TABLET TWICE DAILY WITH MEALS 180 tablet 1  . omeprazole (PRILOSEC) 40 MG capsule TAKE ONE CAPSULE BY MOUTH DAILY 90 capsule 1  . simvastatin (ZOCOR) 40 MG tablet Take 1 tablet (40 mg total) by mouth daily. 90 tablet 3  . SYNTHROID 125 MCG tablet 250 mcg.     . zolpidem (AMBIEN) 5 MG tablet TAKE 1 TABLET BY MOUTH EVERY DAY AT BEDTIME 15 tablet 3   No current facility-administered medications for this visit.   Allergies:  Grapefruit oil   Social History: The patient  reports that she has never smoked. She has never used smokeless tobacco. She reports that she does not drink alcohol or use illicit drugs.   Family History: The patient's family history includes Colon cancer in her brother; Heart attack (age of onset: 34) in her daughter; Heart disease in her mother; Hypertension in her daughter; Lung cancer in her father; Other in her brother.   ROS:  Please see the history of present illness. Otherwise, complete review of systems is positive for arthritis mainly in her knees. She tells me that she may need to have her knees replaced someday, but is holding off on this as long as she can.  All other systems are reviewed and negative.   Physical Exam: VS:  BP 122/74 mmHg  Pulse 100  Ht 5\' 6"  (1.676 m)  Wt 323 lb 12.8 oz (146.875 kg)  BMI 52.29 kg/m2  SpO2 95%  LMP 09/07/2011, BMI Body mass index is 52.29 kg/(m^2).  Wt Readings from  Last 3 Encounters:  08/03/15 323 lb 12.8 oz (146.875 kg)  07/19/15 328 lb (148.78 kg)  01/18/15 323 lb (146.512 kg)    General: Morbidly obese woman, appears comfortable at rest. HEENT: Conjunctiva and lids normal, oropharynx clear. Neck: Supple, no elevated JVP or carotid bruits, no thyromegaly. Lungs: Clear to auscultation, nonlabored breathing at rest. Cardiac: Regular rate and rhythm, no S3, soft systolic murmur, no pericardial rub. Abdomen: Soft, nontender, bowel sounds present, no guarding or rebound. Extremities: Trace ankle edema, distal pulses 2+. Skin: Warm and dry. Musculoskeletal: No kyphosis. Neuropsychiatric: Alert and oriented x3, affect grossly appropriate.  ECG: ECG from 08/24/2014 showed normal sinus rhythm.  Recent Labwork:  07/19/2015: ALT 12; AST 16; BUN 12; Creat 1.11*; Hemoglobin 10.9*; Platelets 299; Potassium 4.2; Sodium 139     Component Value Date/Time   CHOL 184 07/19/2015 1101   TRIG 134 07/19/2015 1101   HDL 49 07/19/2015 1101   CHOLHDL 3.8 07/19/2015 1101   VLDL 27 07/19/2015 1101   LDLCALC 108 07/19/2015 1101    Assessment and Plan:  1. Recurrent precordial pain as outlined above, largely atypical features and sporadic. ECG reviewed and nonspecific. Cardiac risk factors include obesity, hypertension, type 2 diabetes mellitus, and mild hyperlipidemia. She has not undergone any recent objective ischemic testing. We will arrange an exercise Cardiolite with 2 day protocol for further evaluation. Hold Toprol-XL for testing.  2. Essential hypertension, blood pressure is well controlled today.  3. Type 2 diabetes mellitus with diabetic neuropathy. She follows with Dr. Buelah Manis. She is on Glucophage. Recent hemoglobin A1c 7.1.  4. Hyperlipidemia, on statin therapy, recent LDL 108.  Current medicines were reviewed with the patient today.   Orders Placed This Encounter  Procedures  . NM Myocar Multi W/Spect W/Wall Motion / EF  . EKG 12-Lead     Disposition: Call with results.   Signed, Satira Sark, MD, [facility] 08/03/2015 10:13 AM    [place] at [facility] [address] [place], [place] [address] Phone: [phone]; Fax: [phone]

## 2015-08-03 NOTE — Telephone Encounter (Signed)
Submitted humana referral thru acuity connect for authorization to Dr. Hildred Alamin with authorization [phone]  Requesting provider: Neysa Hotter  Treating provider: Charlene Brooke  Number of visits:6  Start Date: 08/03/15  End Date: 01/30/16  Dx:I10- Essential hypertension      R07.89- Other chest pain

## 2015-08-07 ENCOUNTER — Other Ambulatory Visit: Payer: Self-pay | Admitting: Family Medicine

## 2015-08-08 NOTE — Telephone Encounter (Signed)
?   OK to Refill - LOV - 07/19/15 - LRF 11/09/14 #15/3

## 2015-08-08 NOTE — Telephone Encounter (Signed)
Medication called to pharmacy. 

## 2015-08-08 NOTE — Telephone Encounter (Signed)
okay

## 2015-08-09 ENCOUNTER — Encounter (HOSPITAL_COMMUNITY)
Admission: RE | Admit: 2015-08-09 | Discharge: 2015-08-09 | Disposition: A | Discharge status: Home / Self Care | Payer: Commercial Managed Care - HMO | Source: Ambulatory Visit | Attending: Cardiology | Admitting: Cardiology

## 2015-08-09 ENCOUNTER — Inpatient Hospital Stay (HOSPITAL_COMMUNITY): Admission: RE | Admit: 2015-08-09 | Payer: Self-pay | Source: Ambulatory Visit

## 2015-08-09 ENCOUNTER — Ambulatory Visit (HOSPITAL_COMMUNITY): Payer: Self-pay

## 2015-08-09 ENCOUNTER — Encounter (HOSPITAL_COMMUNITY): Payer: Self-pay

## 2015-08-09 ENCOUNTER — Encounter (HOSPITAL_COMMUNITY): Payer: Commercial Managed Care - HMO

## 2015-08-09 DIAGNOSIS — R072 Precordial pain: Secondary | ICD-10-CM | POA: Diagnosis not present

## 2015-08-09 DIAGNOSIS — R079 Chest pain, unspecified: Secondary | ICD-10-CM | POA: Diagnosis not present

## 2015-08-09 MED ORDER — TECHNETIUM TC 99M SESTAMIBI GENERIC - CARDIOLITE
30.0000 | Freq: Once | INTRAVENOUS | Status: AC | PRN
Start: 2015-08-09 — End: 2015-08-09
  Administered 2015-08-09: 30 via INTRAVENOUS

## 2015-08-09 MED ORDER — SODIUM CHLORIDE 0.9 % IJ SOLN
INTRAMUSCULAR | Status: AC
  Administered 2015-08-09: 10 mL via INTRAVENOUS
  Filled 2015-08-09: qty 3

## 2015-08-09 MED ORDER — REGADENOSON 0.4 MG/5ML IV SOLN
INTRAVENOUS | Status: AC
  Administered 2015-08-09: 0.4 mg via INTRAVENOUS
  Filled 2015-08-09: qty 5

## 2015-08-10 ENCOUNTER — Encounter (HOSPITAL_COMMUNITY)
Admission: RE | Admit: 2015-08-10 | Discharge: 2015-08-10 | Disposition: A | Discharge status: Home / Self Care | Payer: Commercial Managed Care - HMO | Source: Ambulatory Visit | Attending: Cardiology | Admitting: Cardiology

## 2015-08-10 ENCOUNTER — Encounter (HOSPITAL_COMMUNITY): Payer: Self-pay

## 2015-08-10 DIAGNOSIS — R072 Precordial pain: Secondary | ICD-10-CM | POA: Diagnosis not present

## 2015-08-10 LAB — NM MYOCAR MULTI W/SPECT W/WALL MOTION / EF
CHL CUP NUCLEAR SDS: 5
LHR: 0.37
LV dias vol: 125 mL
LV sys vol: 47 mL
Peak HR: 91 {beats}/min
Rest HR: 59 {beats}/min
SRS: 2
SSS: 7
TID: 0.89

## 2015-08-10 MED ORDER — TECHNETIUM TC 99M SESTAMIBI GENERIC - CARDIOLITE
25.0000 | Freq: Once | INTRAVENOUS | Status: AC | PRN
  Administered 2015-08-10: 26 via INTRAVENOUS

## 2015-08-31 ENCOUNTER — Other Ambulatory Visit: Payer: Self-pay | Admitting: Family Medicine

## 2015-08-31 NOTE — Telephone Encounter (Signed)
Medication refilled per protocol. 

## 2015-09-06 ENCOUNTER — Other Ambulatory Visit: Payer: Self-pay | Admitting: Family Medicine

## 2015-09-06 NOTE — Telephone Encounter (Signed)
Medication refilled per protocol. 

## 2015-09-15 ENCOUNTER — Emergency Department (HOSPITAL_COMMUNITY): Payer: Commercial Managed Care - HMO

## 2015-09-15 ENCOUNTER — Emergency Department (HOSPITAL_COMMUNITY)
Admission: EM | Admit: 2015-09-15 | Discharge: 2015-09-15 | Disposition: A | Discharge status: Home / Self Care | Payer: Commercial Managed Care - HMO | Attending: Emergency Medicine | Admitting: Emergency Medicine

## 2015-09-15 ENCOUNTER — Encounter (HOSPITAL_COMMUNITY): Payer: Self-pay

## 2015-09-15 DIAGNOSIS — Y9289 Other specified places as the place of occurrence of the external cause: Secondary | ICD-10-CM | POA: Diagnosis not present

## 2015-09-15 DIAGNOSIS — Z7984 Long term (current) use of oral hypoglycemic drugs: Secondary | ICD-10-CM | POA: Insufficient documentation

## 2015-09-15 DIAGNOSIS — Z791 Long term (current) use of non-steroidal anti-inflammatories (NSAID): Secondary | ICD-10-CM | POA: Insufficient documentation

## 2015-09-15 DIAGNOSIS — Y9389 Activity, other specified: Secondary | ICD-10-CM | POA: Diagnosis not present

## 2015-09-15 DIAGNOSIS — K219 Gastro-esophageal reflux disease without esophagitis: Secondary | ICD-10-CM | POA: Insufficient documentation

## 2015-09-15 DIAGNOSIS — I10 Essential (primary) hypertension: Secondary | ICD-10-CM | POA: Insufficient documentation

## 2015-09-15 DIAGNOSIS — W108XXA Fall (on) (from) other stairs and steps, initial encounter: Secondary | ICD-10-CM | POA: Diagnosis not present

## 2015-09-15 DIAGNOSIS — Z862 Personal history of diseases of the blood and blood-forming organs and certain disorders involving the immune mechanism: Secondary | ICD-10-CM | POA: Insufficient documentation

## 2015-09-15 DIAGNOSIS — Z85850 Personal history of malignant neoplasm of thyroid: Secondary | ICD-10-CM | POA: Diagnosis not present

## 2015-09-15 DIAGNOSIS — E039 Hypothyroidism, unspecified: Secondary | ICD-10-CM | POA: Insufficient documentation

## 2015-09-15 DIAGNOSIS — Z7951 Long term (current) use of inhaled steroids: Secondary | ICD-10-CM | POA: Insufficient documentation

## 2015-09-15 DIAGNOSIS — M25562 Pain in left knee: Secondary | ICD-10-CM | POA: Diagnosis not present

## 2015-09-15 DIAGNOSIS — F419 Anxiety disorder, unspecified: Secondary | ICD-10-CM | POA: Diagnosis not present

## 2015-09-15 DIAGNOSIS — Y998 Other external cause status: Secondary | ICD-10-CM | POA: Diagnosis not present

## 2015-09-15 DIAGNOSIS — M174 Other bilateral secondary osteoarthritis of knee: Secondary | ICD-10-CM | POA: Diagnosis not present

## 2015-09-15 DIAGNOSIS — E669 Obesity, unspecified: Secondary | ICD-10-CM | POA: Insufficient documentation

## 2015-09-15 DIAGNOSIS — Z79899 Other long term (current) drug therapy: Secondary | ICD-10-CM | POA: Insufficient documentation

## 2015-09-15 DIAGNOSIS — E1140 Type 2 diabetes mellitus with diabetic neuropathy, unspecified: Secondary | ICD-10-CM | POA: Diagnosis not present

## 2015-09-15 DIAGNOSIS — S8991XA Unspecified injury of right lower leg, initial encounter: Secondary | ICD-10-CM | POA: Diagnosis present

## 2015-09-15 DIAGNOSIS — Z7982 Long term (current) use of aspirin: Secondary | ICD-10-CM | POA: Diagnosis not present

## 2015-09-15 DIAGNOSIS — M1710 Unilateral primary osteoarthritis, unspecified knee: Secondary | ICD-10-CM

## 2015-09-15 DIAGNOSIS — M179 Osteoarthritis of knee, unspecified: Secondary | ICD-10-CM | POA: Diagnosis not present

## 2015-09-15 DIAGNOSIS — M7989 Other specified soft tissue disorders: Secondary | ICD-10-CM | POA: Diagnosis not present

## 2015-09-15 MED ORDER — HYDROCODONE-ACETAMINOPHEN 5-325 MG PO TABS: 2.0000 | ORAL_TABLET | ORAL | Status: DC | PRN

## 2015-09-15 NOTE — ED Notes (Signed)
Pt states she fell down some steps on January 29th. Complain of pain in left leg.

## 2015-09-15 NOTE — Discharge Instructions (Signed)

## 2015-09-16 NOTE — ED Provider Notes (Signed)
CSN: [id number]     Arrival date & time 09/15/15  [id number] History   First MD Initiated Contact with Patient 09/15/15 [phone]     Chief Complaint  Patient presents with  . Leg Pain     (Consider location/radiation/quality/duration/timing/severity/associated sxs/prior Treatment) Patient is a 60 y.o. female presenting with leg pain. The history is provided by the patient. No language interpreter was used.  Leg Pain Location:  Knee Time since incident:  2 weeks Injury: yes   Knee location:  R knee Pain details:    Quality:  Aching   Radiates to:  Does not radiate   Severity:  Moderate   Onset quality:  Gradual   Duration:  2 weeks   Timing:  Constant   Progression:  Worsening Chronicity:  Recurrent Prior injury to area:  No Relieved by:  Nothing Worsened by:  Nothing tried Ineffective treatments:  None tried Associated symptoms: decreased ROM   Risk factors: known bone disorder   Pt has a histroy of arthritis in her knee.  Pt fell and hit her knee. Pt has a history of arthritis and has been told she needs a knee replacement.  Pt sees an English as a second language teacher at [facility].  Past Medical History  Diagnosis Date  . Type 2 diabetes mellitus with diabetic neuropathy ([place])   . Essential hypertension   . Obesity   . History of thyroid cancer 2011    Papillary thyroid cancer s/p radiation  . Chronic anemia   . GERD (gastroesophageal reflux disease)   . Back pain   . Hypothyroidism   . Anxiety   . Seasonal allergies   . Lumbar spinal stenosis   . Arthritis of both knees   . Cancer [facility])     Thyroid   Past Surgical History  Procedure Laterality Date  . Cesarean section    . Tubal ligation    . Tonsilectomy, adenoidectomy, bilateral myringotomy and tubes  1987  . Carpal tunnel release      4 total, bilaterally  . Rotator cuff repair Bilateral     bilaterally  . Total thyroidectomy  2011  . Elbow surgery Bilateral   . Colonoscopy with esophagogastroduodenoscopy (egd) N/A  12/08/2012    Procedure: COLONOSCOPY WITH ESOPHAGOGASTRODUODENOSCOPY (EGD);  Surgeon: Danie Binder, MD;  [facility];  Service: Endoscopy;  Laterality: N/A;  9:45  . Givens capsule study N/A 12/31/2012    Procedure: GIVENS CAPSULE STUDY;  Surgeon: Danie Binder, MD;  Location: [facility];  Service: Endoscopy;  Laterality: N/A;  7:30  . Enteroscopy N/A 01/16/2013    Procedure: ENTEROSCOPY;  Surgeon: Danie Binder, MD;  Location: [facility];  Service: Endoscopy;  Laterality: N/A;  2:00  . Biopsy N/A 01/16/2013    Procedure: BIOPSY;  Surgeon: Danie Binder, MD;  Location: [facility];  Service: Endoscopy;  Laterality: N/A;  SMALL BOWEL ULCERS  . Cholecystectomy  1990's  . Spine surgery      Lumbar Fusion- Dr. Trenton Gammon   Family History  Problem Relation Age of Onset  . Heart disease Mother   . Lung cancer Father     Age 59  . Colon cancer Brother     Age 76  . Heart attack Daughter 2  . Hypertension Daughter   . Other Brother     Agent orange, age 34, cancer   Social History  Substance Use Topics  . Smoking status: Never Smoker   . Smokeless tobacco: Never Used  .  Alcohol Use: No     Comment: Occassional   OB History    No data available     Review of Systems  All other systems reviewed and are negative.     Allergies  Grapefruit oil  Home Medications   Prior to Admission medications   Medication Sig Start Date End Date Taking? Authorizing Provider  ACCU-CHEK FASTCLIX LANCETS MISC 1 Units by Does not apply route daily. 09/10/13   Alycia Rossetti, MD  albuterol (PROVENTIL HFA;VENTOLIN HFA) 108 (90 BASE) MCG/ACT inhaler Inhale 2 puffs into the lungs every 6 (six) hours as needed for wheezing. 08/20/13   Alycia Rossetti, MD  aspirin 81 MG tablet Take 81 mg by mouth every other day.    Historical Provider, MD  cyclobenzaprine (FLEXERIL) 10 MG tablet TAKE 1 TABLET BY MOUTH EVERY 8 HOURS AS NEEDED FOR MUSCLE SPASMS 07/19/15   Alycia Rossetti, MD   diclofenac sodium (VOLTAREN) 1 % GEL Apply 2 g topically 4 (four) times daily. 04/15/15   Alycia Rossetti, MD  FLUoxetine (PROZAC) 40 MG capsule TAKE 1 CAPSULE EVERY DAY BEFORE BREAKFAST (REQUIRE OFFICE VISIT BEFORE ANY FURTHER REFILLS CAN BE GIVEN) 08/31/15   Alycia Rossetti, MD  fluticasone (FLONASE) 50 MCG/ACT nasal spray PLACE 2 SPRAYS INTO BOTH NOSTRILS DAILY.    Alycia Rossetti, MD  furosemide (LASIX) 20 MG tablet TAKE 1 TABLET EVERY DAY BEFORE BREAKFAST 03/29/15   Alycia Rossetti, MD  gabapentin (NEURONTIN) 300 MG capsule TAKE 1 CAPSULE TWICE DAILY 02/09/15   Alycia Rossetti, MD  glucose blood test strip Use as instructed [place] for Accuchek nano 09/10/13   Alycia Rossetti, MD  HYDROcodone-acetaminophen (NORCO/VICODIN) 5-325 MG tablet Take 2 tablets by mouth every 4 (four) hours as needed. 09/15/15   Fransico Meadow, PA-C  lisinopril (PRINIVIL,ZESTRIL) 40 MG tablet TAKE 1 TABLET EVERY DAY 07/26/15   Alycia Rossetti, MD  meclizine (ANTIVERT) 25 MG tablet Take 1 tablet (25 mg total) by mouth 3 (three) times daily as needed for dizziness. 01/12/15   Alycia Rossetti, MD  metFORMIN (GLUCOPHAGE) 1000 MG tablet TAKE 1 TABLET TWICE DAILY EVERY MORNING AND EVENING 07/19/15   Alycia Rossetti, MD  metoprolol succinate (TOPROL-XL) 25 MG 24 hr tablet TAKE 1 TABLET EVERY DAY 02/09/15   Alycia Rossetti, MD  naproxen (NAPROSYN) 500 MG tablet TAKE 1 TABLET TWICE DAILY WITH MEALS 02/09/15   Alycia Rossetti, MD  omeprazole (PRILOSEC) 40 MG capsule TAKE 1 CAPSULE EVERY DAY 09/06/15   Alycia Rossetti, MD  simvastatin (ZOCOR) 40 MG tablet Take 1 tablet (40 mg total) by mouth daily. 07/19/15   Alycia Rossetti, MD  SYNTHROID 125 MCG tablet 250 mcg.  01/14/15   Historical Provider, MD  zolpidem (AMBIEN) 5 MG tablet TAKE 1 TABLET BY MOUTH EVERY NIGHT AT BEDTIME AS NEEDED FOR INSOMNIA 08/08/15   Alycia Rossetti, MD   BP 140/60 mmHg  Pulse 65  Temp(Src) 98.1 F (36.7 C) (Oral)  Resp 18  Ht 5\' 6"  (1.676 m)  Wt 146.512  kg  BMI 52.16 kg/m2  SpO2 99%  LMP 09/07/2011 Physical Exam  Constitutional: She is oriented to person, place, and time. She appears well-developed and well-nourished.  HENT:  Head: Normocephalic.  Eyes: EOM are normal.  Neck: Normal range of motion.  Pulmonary/Chest: Effort normal.  Abdominal: She exhibits no distension.  Musculoskeletal: Normal range of motion.  Swollen right knee,  Pain with range of  motion,  nv and ns intact, no deformity  No instability  Neurological: She is alert and oriented to person, place, and time.  Psychiatric: She has a normal mood and affect.  Nursing note and vitals reviewed.   ED Course  Procedures (including critical care time) Labs Review Labs Reviewed - No data to display  Imaging Review Dg Knee Complete 4 Views Left  09/15/2015  CLINICAL DATA:  Anterior left knee pain with swelling and difficulty bearing weight. EXAM: LEFT KNEE - COMPLETE 4+ VIEW COMPARISON:  None. FINDINGS: Moderately severe tricompartmental left knee osteoarthritis noted, most pronounced in the medial and patellofemoral compartments. These areas demonstrate joint space loss, sclerosis and osteophytes. Normal alignment. No fracture or effusion. Obese body habitus. IMPRESSION: Moderately severe left knee tricompartmental osteoarthritis, most pronounced in the medial and patellofemoral compartments. No acute osseous finding or significant effusion. Electronically Signed   By: Jerilynn Mages.  Shick M.D.   On: 09/15/2015 10:21   I have personally reviewed and evaluated these images and lab results as part of my medical decision-making.   EKG Interpretation None      MDM  Pt counseled on degenerative changes.     Final diagnoses:  Tricompartment degenerative joint disease of knee    Meds ordered this encounter  Medications  . HYDROcodone-acetaminophen (NORCO/VICODIN) 5-325 MG tablet    Sig: Take 2 tablets by mouth every 4 (four) hours as needed.    Dispense:  20 tablet    Refill:  0     Order Specific Question:  Supervising Provider    Answer:  Noemi Chapel [[id number]]   Follow up with your Orthopaedist in [place] for further evaluation Use your cane for support   Fransico Meadow, PA-C 09/16/15 [place], PA-C 09/16/15 [place], PA-C 09/16/15 [place], MD 09/16/15 1513

## 2015-09-21 ENCOUNTER — Other Ambulatory Visit: Payer: Self-pay | Admitting: Family Medicine

## 2015-09-22 NOTE — Telephone Encounter (Signed)
Medication refilled per protocol. 

## 2015-10-08 ENCOUNTER — Other Ambulatory Visit: Payer: Self-pay | Admitting: Family Medicine

## 2015-10-10 ENCOUNTER — Other Ambulatory Visit: Payer: Self-pay | Admitting: Family Medicine

## 2015-10-10 DIAGNOSIS — M25562 Pain in left knee: Principal | ICD-10-CM

## 2015-10-10 DIAGNOSIS — M25561 Pain in right knee: Secondary | ICD-10-CM

## 2015-10-10 NOTE — Telephone Encounter (Signed)
Refill appropriate and filled per protocol. 

## 2015-10-13 DIAGNOSIS — M1711 Unilateral primary osteoarthritis, right knee: Secondary | ICD-10-CM | POA: Diagnosis not present

## 2015-10-13 DIAGNOSIS — M170 Bilateral primary osteoarthritis of knee: Secondary | ICD-10-CM | POA: Diagnosis not present

## 2015-10-13 DIAGNOSIS — M1712 Unilateral primary osteoarthritis, left knee: Secondary | ICD-10-CM | POA: Diagnosis not present

## 2015-10-19 ENCOUNTER — Ambulatory Visit (INDEPENDENT_AMBULATORY_CARE_PROVIDER_SITE_OTHER): Payer: Commercial Managed Care - HMO | Admitting: Physician Assistant

## 2015-10-19 ENCOUNTER — Encounter: Payer: Self-pay | Admitting: Physician Assistant

## 2015-10-19 VITALS — BP 120/80 | HR 57 | Temp 98.5°F | Resp 20 | Wt 314.0 lb

## 2015-10-19 DIAGNOSIS — J209 Acute bronchitis, unspecified: Secondary | ICD-10-CM | POA: Diagnosis not present

## 2015-10-19 MED ORDER — PREDNISONE 20 MG PO TABS
20.0000 mg | ORAL_TABLET | Freq: Every day | ORAL | Status: DC
Start: 2015-10-19 — End: 2016-04-06

## 2015-10-19 MED ORDER — AZITHROMYCIN 250 MG PO TABS: ORAL_TABLET | ORAL | Status: DC

## 2015-10-19 MED ORDER — HYDROCODONE-HOMATROPINE 5-1.5 MG/5ML PO SYRP: 5.0000 mL | ORAL_SOLUTION | Freq: Three times a day (TID) | ORAL | Status: DC | PRN

## 2015-10-19 NOTE — Progress Notes (Signed)
Patient ID: Autumn Carson MRN: [id number], DOB: 10-30-1955, 60 y.o. Date of Encounter: 10/19/2015, 1:02 PM    Chief Complaint:  Chief Complaint  Patient presents with  . Cough    had for x2 weeks, believes it may have turned into bronchitis     HPI: 60 y.o. year old AA female presents With above. Dates that it is all just been in her chest with cough. That she has had no congestion in her and had her nose. No fevers. Says that she thinks that what may have triggered it was that she was around some cigarette smoke. However kept Thinking it would clear and it hasn't. Only at night when she is lying down she feels some wheezing and shortness of breath. Has been using her albuterol some night. Has only use the albuterol during the daytime once or twice.     Home Meds:   Outpatient Prescriptions Prior to Visit  Medication Sig Dispense Refill  . ACCU-CHEK FASTCLIX LANCETS MISC 1 Units by Does not apply route daily. 100 each 3  . albuterol (PROVENTIL HFA;VENTOLIN HFA) 108 (90 BASE) MCG/ACT inhaler Inhale 2 puffs into the lungs every 6 (six) hours as needed for wheezing. 1 Inhaler 2  . aspirin 81 MG tablet Take 81 mg by mouth every other day.    . cyclobenzaprine (FLEXERIL) 10 MG tablet TAKE 1 TABLET BY MOUTH EVERY 8 HOURS AS NEEDED FOR MUSCLE SPASMS 45 tablet 2  . diclofenac sodium (VOLTAREN) 1 % GEL Apply 2 g topically 4 (four) times daily. 100 g 3  . FLUoxetine (PROZAC) 40 MG capsule TAKE 1 CAPSULE EVERY DAY BEFORE BREAKFAST (REQUIRE OFFICE VISIT BEFORE ANY FURTHER REFILLS CAN BE GIVEN) 90 capsule 0  . fluticasone (FLONASE) 50 MCG/ACT nasal spray PLACE 2 SPRAYS INTO BOTH NOSTRILS DAILY. 48 g 3  . furosemide (LASIX) 20 MG tablet TAKE 1 TABLET EVERY DAY BEFORE BREAKFAST 90 tablet 2  . gabapentin (NEURONTIN) 300 MG capsule TAKE 1 CAPSULE TWICE DAILY 180 capsule 1  . glucose blood test strip Use as instructed [place] for Accuchek nano 100 each 3  . HYDROcodone-acetaminophen  (NORCO/VICODIN) 5-325 MG tablet Take 2 tablets by mouth every 4 (four) hours as needed. 20 tablet 0  . lisinopril (PRINIVIL,ZESTRIL) 40 MG tablet TAKE 1 TABLET EVERY DAY 90 tablet 0  . meclizine (ANTIVERT) 25 MG tablet Take 1 tablet (25 mg total) by mouth 3 (three) times daily as needed for dizziness. 30 tablet 0  . metFORMIN (GLUCOPHAGE) 1000 MG tablet TAKE 1 TABLET TWICE DAILY EVERY MORNING AND EVENING 180 tablet 1  . metoprolol succinate (TOPROL-XL) 25 MG 24 hr tablet TAKE 1 TABLET EVERY DAY 90 tablet 1  . naproxen (NAPROSYN) 500 MG tablet TAKE 1 TABLET TWICE DAILY WITH MEALS 180 tablet 1  . omeprazole (PRILOSEC) 40 MG capsule TAKE 1 CAPSULE EVERY DAY 90 capsule 1  . simvastatin (ZOCOR) 40 MG tablet Take 1 tablet (40 mg total) by mouth daily. 90 tablet 3  . SYNTHROID 125 MCG tablet 250 mcg.     . zolpidem (AMBIEN) 5 MG tablet TAKE 1 TABLET BY MOUTH EVERY NIGHT AT BEDTIME AS NEEDED FOR INSOMNIA 15 tablet 3   No facility-administered medications prior to visit.    Allergies:  Allergies  Allergen Reactions  . Grapefruit Oil Anaphylaxis    Grape fruit causes Anaphylaxis      Review of Systems: See HPI for pertinent ROS. All other ROS negative.    Physical Exam: Blood  pressure 120/80, pulse 57, temperature 98.5 F (36.9 C), temperature source Oral, resp. rate 20, weight 314 lb (142.429 kg), last menstrual period 09/07/2011, SpO2 96 %., Body mass index is 50.7 kg/(m^2). General:  Obese AAF. Appears in no acute distress. HEENT: Normocephalic, atraumatic, eyes without discharge, sclera non-icteric, nares are without discharge. Bilateral auditory canals clear, TM's are without perforation, pearly grey and translucent with reflective cone of light bilaterally. Oral cavity moist, posterior pharynx without exudate, erythema, peritonsillar abscess.  Neck: Supple. No thyromegaly. No lymphadenopathy. Lungs: Clear bilaterally to auscultation without wheezes, rales, or rhonchi. Breathing is  unlabored. Currently lungs are clear with good breath sounds and good air movement throughout bilaterally. No wheezing on exam at this time. Heart: Regular rhythm. No murmurs, rubs, or gallops. Abdomen: Soft, non-tender, non-distended with normoactive bowel sounds. No hepatomegaly. No rebound/guarding. No obvious abdominal masses. Msk:  Strength and tone normal for age. Extremities/Skin: Warm and dry. No clubbing or cyanosis. No edema. No rashes or suspicious lesions. Neuro: Alert and oriented X 3. Moves all extremities spontaneously. Gait is normal. CNII-XII grossly in tact. Psych:  Responds to questions appropriately with a normal affect.     ASSESSMENT AND PLAN:  60 y.o. year old female with  1. Acute bronchitis, unspecified organism She is to take the prednisone and azithromycin as directed. Can use the Hycodan as needed for cough suppressant especially so that she can get some sleep. She already has albuterol inhaler available to use if needed/as directed. Follow-up if symptoms worsen or if symptoms do not resolve upon completion of prednisone and antibiotic. - azithromycin (ZITHROMAX) 250 MG tablet; Day 1: Take 2 daily.  Days 2-5: take 1 daily.  Dispense: 6 tablet; Refill: 0 - predniSONE (DELTASONE) 20 MG tablet; Take 1 tablet (20 mg total) by mouth daily with breakfast.  Dispense: 5 tablet; Refill: 0 - HYDROcodone-homatropine (HYCODAN) 5-1.5 MG/5ML syrup; Take 5 mLs by mouth every 8 (eight) hours as needed for cough.  Dispense: 120 mL; Refill: 0   Signed, [address] [place], [place], [facility] 10/19/2015 1:02 PM

## 2015-10-21 ENCOUNTER — Telehealth: Payer: Self-pay | Admitting: Family Medicine

## 2015-10-21 ENCOUNTER — Telehealth: Payer: Self-pay | Admitting: *Deleted

## 2015-10-21 DIAGNOSIS — R109 Unspecified abdominal pain: Secondary | ICD-10-CM

## 2015-10-21 NOTE — Telephone Encounter (Signed)
Noted! Thank you

## 2015-10-21 NOTE — Telephone Encounter (Signed)
Submitted humana referral thru acuity connect for authorization on 10/10/15 with authorization [id number]  Requesting provider: Lonell Grandchild [place],MD  Treating provider: Dorene Grebe  Number of visits:6  Start Date: 10/13/15  End Date: 04/10/16  Dx: M25.561- Pain in Right knee       M25.562- pain in Left knee

## 2015-10-21 NOTE — Telephone Encounter (Signed)
Rec'd call from [place] at [place].  Pt has appt on 10/27/2015 to be seen for Epigastric pain.  Need [facility] referral.  Referral placed.

## 2015-10-27 ENCOUNTER — Other Ambulatory Visit: Payer: Self-pay

## 2015-10-27 ENCOUNTER — Telehealth: Payer: Self-pay | Admitting: *Deleted

## 2015-10-27 ENCOUNTER — Ambulatory Visit (INDEPENDENT_AMBULATORY_CARE_PROVIDER_SITE_OTHER): Payer: Commercial Managed Care - HMO | Admitting: Gastroenterology

## 2015-10-27 ENCOUNTER — Encounter: Payer: Self-pay | Admitting: Gastroenterology

## 2015-10-27 VITALS — BP 144/69 | HR 60 | Temp 98.0°F | Ht 66.0 in | Wt 314.2 lb

## 2015-10-27 DIAGNOSIS — Z800 Family history of malignant neoplasm of digestive organs: Secondary | ICD-10-CM | POA: Diagnosis not present

## 2015-10-27 DIAGNOSIS — K219 Gastro-esophageal reflux disease without esophagitis: Secondary | ICD-10-CM | POA: Diagnosis not present

## 2015-10-27 DIAGNOSIS — R1310 Dysphagia, unspecified: Secondary | ICD-10-CM | POA: Diagnosis not present

## 2015-10-27 NOTE — Progress Notes (Signed)
ON RECALL  °

## 2015-10-27 NOTE — Telephone Encounter (Signed)
Submitted humana referral thru acuity connect for authorization to Dr. [place] with authorization [id number]  Requesting provider: Neysa Hotter  Treating provider: [place]  Number of visits: 6  Start Date: 10/27/15  End Date:04/24/16  Dx:R10.13-Epigastric pain

## 2015-10-27 NOTE — Progress Notes (Signed)
cc'ed to pcp °

## 2015-10-27 NOTE — Progress Notes (Signed)
Subjective:    Patient ID: Autumn Carson, female    DOB: 1955/09/23, 60 y.o.   MRN: [id number]  Vic Blackbird, MD  HPI HEARTBURN-WELL CONTROLLED. HAS SQUEEZING DISCOMFORT IN CHEST-CONSTANT. HAD ONE 3-4 MINS AGO. LASTS SECONDS TO ONE IN. CAME BACK TO GI. AT NIGHT IT GETS THE TIGHTEST. JUST GRABS. MOVING LIKE A MUSCLE. TROUBLE SWALLOWING EVERY TIME SH EATS-MOSTLY SOLIDS. BMs: QOD. WEIGHT LOSS: NONE. APPETITE: GOOD.   PT DENIES FEVER, CHILLS, HEMATOCHEZIA, nausea, vomiting, melena, diarrhea, CHEST PAIN, SHORTNESS OF BREATH, CHANGE IN BOWEL IN HABITS, constipation, abdominal pain, problems with sedation, OR heartburn or indigestion.   Past Medical History  Diagnosis Date  . Type 2 diabetes mellitus with diabetic neuropathy ([place])   . Essential hypertension   . Obesity   . History of thyroid cancer 2011    Papillary thyroid cancer s/p radiation  . Chronic anemia   . GERD (gastroesophageal reflux disease)   . Back pain   . Hypothyroidism   . Anxiety   . Seasonal allergies   . Lumbar spinal stenosis   . Arthritis of both knees   . Cancer [facility])     Thyroid   Past Surgical History  Procedure Laterality Date  . Cesarean section    . Tubal ligation    . Tonsilectomy, adenoidectomy, bilateral myringotomy and tubes  1987  . Carpal tunnel release      4 total, bilaterally  . Rotator cuff repair Bilateral     bilaterally  . Total thyroidectomy  2011  . Elbow surgery Bilateral   . Colonoscopy with esophagogastroduodenoscopy (egd) N/A 12/08/2012    SLF: 1. Normal mucosa in the terminal ileum 2. mild diverticulosis throughout the entire examined colon. 3. rectal bleeding due to moderate sized internal hemorrhoids.   Freda Munro capsule study N/A 12/31/2012    Procedure: GIVENS CAPSULE STUDY;  Surgeon: Danie Binder, MD;  [facility];  Service: Endoscopy;  Laterality: N/A;  7:30  . Enteroscopy N/A 01/16/2013    Procedure: ENTEROSCOPY;  Surgeon: Danie Binder, MD;   [facility];  Service: Endoscopy;  Laterality: N/A;  2:00  . Biopsy N/A 01/16/2013    Procedure: BIOPSY;  Surgeon: Danie Binder, MD;  [facility];  Service: Endoscopy;  Laterality: N/A;  SMALL BOWEL ULCERS  . Cholecystectomy  1990's  . Spine surgery      Lumbar Fusion- Dr. Trenton Gammon   Allergies  Allergen Reactions  . Grapefruit Oil Anaphylaxis    Grape fruit causes Anaphylaxis   Current Outpatient Prescriptions  Medication Sig Dispense Refill  . ACCU-CHEK FASTCLIX  1 Units by Does not apply route daily.    Marland Kitchen albuterol MCG/ACT inhaler Inhale 2 puffs into the lungs every 6 (six) hours as needed for wheezing.    Marland Kitchen aspirin 81 MG tablet Take 81 mg by mouth every other day.    .      . FLEXERIL) 10 MG tablet TAKE 1 TABLET BY MOUTH EVERY 8 HOURS PRN S    .      . FLUoxetine (PROZAC) 40 MG capsule TAKE 1 CAPSULE EVERY DAY BEFORE BREAKFAST     . fluticasone 50 MCG/ACT nasal spray PLACE 2 SPRAYS INTO BOTH NOSTRILS DAILY.    . furosemide (LASIX) 20 MG tablet TAKE 1 TABLET EVERY DAY BEFORE BREAKFAST    . Gabapentin 300 MG capsule TAKE 1 CAPSULE TWICE DAILY    . glucose blood test strip Use as instructed [place] for  Accuchek nano    .  (NORCO/VICODIN) 5-325 MG tablet Take 2 tablets by mouth every 4 (four) hours as needed. <1X/WK   . HYCODAN) 5-1.5 MG/5ML syrup Take 5 mLs by mouth every 8 (eight) hoursPRN for cough.    Marland Kitchen lisinopril 40 MG tablet TAKE 1 TABLET EVERY DAY    . meclizine (ANTIVERT) 25 MG tablet Take 1 tablet (25 mg total) by mouth 3 (three) times daily as needed for dizziness.    Marland Kitchen GLUCOPHAGE) 1000 MG tablet TAKE 1 TABLET TWICE DAILY EVERY MORNING AND EVENING    . TOPROL-XL) 25 MG 24 hr tablet TAKE 1 TABLET EVERY DAY    .      Marland Kitchen PRILOSEC) 40 MG capsule TAKE 1 CAPSULE EVERY DAY    .      . simvastatin (ZOCOR) 40 MG tablet Take 1 tablet (40 mg total) by mouth daily.    Marland Kitchen SYNTHROID 125 MCG tablet 200 mcg.     . zolpidem (AMBIEN) 5 MG tablet TAKE 1 TABLET BY MOUTH EVERY  NIGHT AT BEDTIME PRN     Review of Systems PER HPI OTHERWISE ALL SYSTEMS ARE NEGATIVE.    Objective:   Physical Exam        Assessment & Plan:

## 2015-10-27 NOTE — Assessment & Plan Note (Addendum)
SYMPTOMS FAIRLY WELL CONTROLLED BUT HAVE NON-ACID OR ATYPICAL ACID REFLUX DUE TO PAIN PERCEPTION BEING IMPAIRED BY DIABETES.  CONTINUE WEIGHT LOSS EFFORTS. AVOID REFLUX TRIGGERS.  HANDOUT GIVEN. CONTINUE OMEPRAZOLE.  TAKE 30 MINUTES PRIOR TO YOUR FIrST MEAL. MAY NEED TO CHANGE TO DEXILANT, OR ADD BACLOFEN. EXPLAINED TO PT NOT A CANDIDATE FOR REFLUX SURGERY UNLESS BMI < 40. COMPLETE BARIUM SWALLOW next week. UPPER ENDOSCOPY TO stretch YOUR ESOPHAGUS and place the BRAVO CAPSULE IN YOUR ESOPHAGUS IN 2-3 WEEKS. FOLLOW UP IN 4 MOS.

## 2015-10-27 NOTE — Assessment & Plan Note (Signed)
SYMPTOMS NOT CONTROLLED & LIKELY DUE TO UNCONTROLLED GERD, LESS LIKELY PEPTIC STRICTURE OR EG JXN CANCER   CONTINUE YOUR WEIGHT LOSS EFFORTS. AVOID REFLUX TRIGGERS.  HANDOUT GIVEN. CONTINUE OMEPRAZOLE.  TAKE 30 MINUTES PRIOR TO YOUR FIrST MEAL. COMPLETE BARIUM SWALLOW next week. UPPER ENDOSCOPY TO stretch YOUR ESOPHAGUS and place the BRAVO CAPSULE IN YOUR ESOPHAGUS IN 2-3 WEEKS. PHENERGAN 12.5 MG IV IN PREOP. DISCUSSED PROCEDURE, BENEFITS, & RISKS: < 1% chance of medication reaction, PERFORATION, CAPSULE IN LUNG, OR bleeding. FOLLOW UP IN 4 MOS.

## 2015-10-27 NOTE — Assessment & Plan Note (Signed)
TCS IN 2024-MOTHER HAD COLON CANCER AFTER AGE 60

## 2015-10-27 NOTE — Patient Instructions (Signed)
CONTINUE YOUR WEIGHT LOSS EFFORTS.  AVOID REFLUX TRIGGERS. SEE INFO BELOW.  CONTINUE OMEPRAZOLE.  TAKE 30 MINUTES PRIOR TO YOUR FIrST MEAL.   COMPLETE BARIUM SWALLOW next week.  UPPER ENDOSCOPY TO stretch YOUR ESOPHAGUS and place the BRAVO CAPSULE IN YOUR ESOPHAGUS IN 2-3 WEEKS.  FOLLOW UP IN 4 MOS.     Lifestyle and home remedies TO HELP CONTROL HEARTBURN/REFLUX.  You may eliminate or reduce the frequency of heartburn by making the following lifestyle changes:  . Control your weight. Being overweight is a major risk factor for heartburn and GERD. Excess pounds put pressure on your abdomen, pushing up your stomach and causing acid to back up into your esophagus.   . Eat smaller meals. 4 TO 6 MEALS A DAY. This reduces pressure on the lower esophageal sphincter, helping to prevent the valve from opening and acid from washing back into your esophagus.   Dolphus Jenny your belt. Clothes that fit tightly around your waist put pressure on your abdomen and the lower esophageal sphincter.  .  . Eliminate heartburn triggers. Everyone has specific triggers. Common triggers such as fatty or fried foods, spicy food, tomato sauce, carbonated beverages, alcohol, chocolate, mint, garlic, onion, caffeine and nicotine may make heartburn worse.   Marland Kitchen Avoid stooping or bending. Tying your shoes is OK. Bending over for longer periods to weed your garden isn't, especially soon after eating.   . Don't lie down after a meal. Wait at least three to four hours after eating before going to bed, and don't lie down right after eating.   Marland Kitchen PLACE THE HEAD OF YOUR BED ON 6 INCH BLOCKS.  Alternative medicine . Several home remedies exist for treating GERD, but they provide only temporary relief. They include drinking baking soda (sodium bicarbonate) added to water or drinking other fluids such as baking soda mixed with cream of tartar and water. . Although these liquids create temporary relief by neutralizing, washing  away or buffering acids, eventually they aggravate the situation by adding gas and fluid to your stomach, increasing pressure and causing more acid reflux. Further, adding more sodium to your diet may increase your blood pressure and add stress to your heart, and excessive bicarbonate ingestion can alter the acid-base balance in your body.

## 2015-10-27 NOTE — Progress Notes (Signed)
Pt is aware of date and time for her EGD instructions are in the mail.

## 2015-11-01 ENCOUNTER — Ambulatory Visit (HOSPITAL_COMMUNITY)
Admission: RE | Admit: 2015-11-01 | Discharge: 2015-11-01 | Disposition: A | Discharge status: Home / Self Care | Payer: Commercial Managed Care - HMO | Source: Ambulatory Visit | Attending: Gastroenterology | Admitting: Gastroenterology

## 2015-11-01 DIAGNOSIS — K225 Diverticulum of esophagus, acquired: Secondary | ICD-10-CM | POA: Diagnosis not present

## 2015-11-01 DIAGNOSIS — R1310 Dysphagia, unspecified: Secondary | ICD-10-CM | POA: Diagnosis not present

## 2015-11-01 DIAGNOSIS — E119 Type 2 diabetes mellitus without complications: Secondary | ICD-10-CM | POA: Diagnosis not present

## 2015-11-01 DIAGNOSIS — I10 Essential (primary) hypertension: Secondary | ICD-10-CM | POA: Diagnosis not present

## 2015-11-02 ENCOUNTER — Other Ambulatory Visit: Payer: Self-pay | Admitting: Family Medicine

## 2015-11-02 NOTE — Telephone Encounter (Signed)
Refill appropriate and filled per protocol. 

## 2015-11-04 ENCOUNTER — Telehealth: Payer: Self-pay | Admitting: *Deleted

## 2015-11-04 NOTE — Telephone Encounter (Signed)
Received fax from [facility] care mgmt with authorization to see Dr. Adair Patter authorization [id number]  Requesting provider: Adair Patter  Treating provider: Adair Patter  Number of visits:6  PCP: Neysa Hotter  Number of visits:6  Start date: 11/07/15  End date: 05/05/16  Dx:E11.9-Type 2 diabetes mellitus w/o complications      0000000- Postprocedural hypothyroidism

## 2015-11-07 DIAGNOSIS — Z85850 Personal history of malignant neoplasm of thyroid: Secondary | ICD-10-CM | POA: Diagnosis not present

## 2015-11-07 DIAGNOSIS — E890 Postprocedural hypothyroidism: Secondary | ICD-10-CM | POA: Diagnosis not present

## 2015-11-09 DIAGNOSIS — Z7984 Long term (current) use of oral hypoglycemic drugs: Secondary | ICD-10-CM | POA: Diagnosis not present

## 2015-11-09 DIAGNOSIS — Z23 Encounter for immunization: Secondary | ICD-10-CM | POA: Diagnosis not present

## 2015-11-09 DIAGNOSIS — E119 Type 2 diabetes mellitus without complications: Secondary | ICD-10-CM | POA: Diagnosis not present

## 2015-11-09 DIAGNOSIS — Z85850 Personal history of malignant neoplasm of thyroid: Secondary | ICD-10-CM | POA: Diagnosis not present

## 2015-11-09 DIAGNOSIS — Z6841 Body Mass Index (BMI) 40.0 and over, adult: Secondary | ICD-10-CM | POA: Diagnosis not present

## 2015-11-09 DIAGNOSIS — E890 Postprocedural hypothyroidism: Secondary | ICD-10-CM | POA: Diagnosis not present

## 2015-11-13 ENCOUNTER — Telehealth: Payer: Self-pay | Admitting: Gastroenterology

## 2015-11-13 NOTE — Telephone Encounter (Signed)
PLEASE CALL PT. HER BARIUM SWALLOW DOES NOT SHOW A REASON FOR HER TO HAVE PROBLEMS SWALLOWING.   UPPER ENDOSCOPY TO stretch YOUR ESOPHAGUS and place the BRAVO CAPSULE IN YOUR ESOPHAGUS APR 25. CONTINUE WEIGHT LOSS EFFORTS. AVOID REFLUX TRIGGERS.  CONTINUE OMEPRAZOLE. TAKE 30 MINUTES PRIOR TO YOUR FIrST MEAL. MAY NEED TO CHANGE TO DEXILANT, OR ADD BACLOFEN.

## 2015-11-15 NOTE — Telephone Encounter (Signed)
PT is aware.

## 2015-11-22 ENCOUNTER — Encounter (HOSPITAL_COMMUNITY)
Admission: RE | Disposition: A | Discharge status: Home / Self Care | Payer: Self-pay | Source: Ambulatory Visit | Attending: Gastroenterology

## 2015-11-22 ENCOUNTER — Ambulatory Visit (HOSPITAL_COMMUNITY)
Admission: RE | Admit: 2015-11-22 | Discharge: 2015-11-22 | Disposition: A | Discharge status: Home / Self Care | Payer: Commercial Managed Care - HMO | Source: Ambulatory Visit | Attending: Gastroenterology | Admitting: Gastroenterology

## 2015-11-22 ENCOUNTER — Encounter (HOSPITAL_COMMUNITY): Payer: Self-pay | Admitting: *Deleted

## 2015-11-22 DIAGNOSIS — Z85850 Personal history of malignant neoplasm of thyroid: Secondary | ICD-10-CM | POA: Insufficient documentation

## 2015-11-22 DIAGNOSIS — Z923 Personal history of irradiation: Secondary | ICD-10-CM | POA: Diagnosis not present

## 2015-11-22 DIAGNOSIS — Z79899 Other long term (current) drug therapy: Secondary | ICD-10-CM | POA: Insufficient documentation

## 2015-11-22 DIAGNOSIS — F419 Anxiety disorder, unspecified: Secondary | ICD-10-CM | POA: Diagnosis not present

## 2015-11-22 DIAGNOSIS — E669 Obesity, unspecified: Secondary | ICD-10-CM | POA: Insufficient documentation

## 2015-11-22 DIAGNOSIS — E1140 Type 2 diabetes mellitus with diabetic neuropathy, unspecified: Secondary | ICD-10-CM | POA: Insufficient documentation

## 2015-11-22 DIAGNOSIS — I10 Essential (primary) hypertension: Secondary | ICD-10-CM | POA: Diagnosis not present

## 2015-11-22 DIAGNOSIS — R0789 Other chest pain: Secondary | ICD-10-CM | POA: Diagnosis not present

## 2015-11-22 DIAGNOSIS — E039 Hypothyroidism, unspecified: Secondary | ICD-10-CM | POA: Insufficient documentation

## 2015-11-22 DIAGNOSIS — K219 Gastro-esophageal reflux disease without esophagitis: Secondary | ICD-10-CM | POA: Diagnosis not present

## 2015-11-22 DIAGNOSIS — Z7982 Long term (current) use of aspirin: Secondary | ICD-10-CM | POA: Diagnosis not present

## 2015-11-22 DIAGNOSIS — R1310 Dysphagia, unspecified: Secondary | ICD-10-CM | POA: Insufficient documentation

## 2015-11-22 DIAGNOSIS — Z6841 Body Mass Index (BMI) 40.0 and over, adult: Secondary | ICD-10-CM | POA: Insufficient documentation

## 2015-11-22 DIAGNOSIS — Q394 Esophageal web: Secondary | ICD-10-CM | POA: Diagnosis not present

## 2015-11-22 DIAGNOSIS — Z7984 Long term (current) use of oral hypoglycemic drugs: Secondary | ICD-10-CM | POA: Diagnosis not present

## 2015-11-22 DIAGNOSIS — K209 Esophagitis, unspecified: Secondary | ICD-10-CM | POA: Diagnosis not present

## 2015-11-22 HISTORY — PX: ESOPHAGOGASTRODUODENOSCOPY: SHX5428

## 2015-11-22 HISTORY — PX: BRAVO PH STUDY: SHX5421

## 2015-11-22 HISTORY — PX: SAVORY DILATION: SHX5439

## 2015-11-22 LAB — GLUCOSE, CAPILLARY: Glucose-Capillary: 98 mg/dL (ref 65–99)

## 2015-11-22 SURGERY — EGD (ESOPHAGOGASTRODUODENOSCOPY)
Anesthesia: Moderate Sedation

## 2015-11-22 MED ORDER — LIDOCAINE VISCOUS 2 % MT SOLN
OROMUCOSAL | Status: DC | PRN
  Administered 2015-11-22: 3 mL via OROMUCOSAL

## 2015-11-22 MED ORDER — PROMETHAZINE HCL 25 MG/ML IJ SOLN
INTRAMUSCULAR | Status: AC
  Filled 2015-11-22: qty 1

## 2015-11-22 MED ORDER — SODIUM CHLORIDE 0.9 % IV SOLN
INTRAVENOUS | Status: DC
  Administered 2015-11-22: 13:00:00 via INTRAVENOUS

## 2015-11-22 MED ORDER — PROMETHAZINE HCL 25 MG/ML IJ SOLN
12.5000 mg | Freq: Once | INTRAMUSCULAR | Status: AC
  Administered 2015-11-22: 12.5 mg via INTRAVENOUS

## 2015-11-22 MED ORDER — MIDAZOLAM HCL 5 MG/5ML IJ SOLN
INTRAMUSCULAR | Status: AC
  Filled 2015-11-22: qty 10

## 2015-11-22 MED ORDER — ATROPINE SULFATE 1 MG/ML IJ SOLN
INTRAMUSCULAR | Status: AC
  Filled 2015-11-22: qty 1

## 2015-11-22 MED ORDER — STERILE WATER FOR IRRIGATION IR SOLN
Status: DC | PRN
  Administered 2015-11-22: 14:00:00

## 2015-11-22 MED ORDER — SODIUM CHLORIDE 0.9% FLUSH
INTRAVENOUS | Status: AC
  Filled 2015-11-22: qty 10

## 2015-11-22 MED ORDER — MIDAZOLAM HCL 5 MG/5ML IJ SOLN
INTRAMUSCULAR | Status: DC | PRN
  Administered 2015-11-22 (×3): 2 mg via INTRAVENOUS

## 2015-11-22 MED ORDER — LIDOCAINE VISCOUS 2 % MT SOLN
OROMUCOSAL | Status: AC
  Filled 2015-11-22: qty 15

## 2015-11-22 MED ORDER — MEPERIDINE HCL 100 MG/ML IJ SOLN
INTRAMUSCULAR | Status: AC
  Filled 2015-11-22: qty 2

## 2015-11-22 MED ORDER — ATROPINE SULFATE 1 MG/ML IJ SOLN
INTRAMUSCULAR | Status: DC | PRN
  Administered 2015-11-22: .5 mg via INTRAVENOUS

## 2015-11-22 MED ORDER — MINERAL OIL PO OIL
TOPICAL_OIL | ORAL | Status: AC
  Filled 2015-11-22: qty 30

## 2015-11-22 MED ORDER — MEPERIDINE HCL 100 MG/ML IJ SOLN
INTRAMUSCULAR | Status: DC | PRN
  Administered 2015-11-22 (×3): 25 mg via INTRAVENOUS

## 2015-11-22 NOTE — H&P (View-Only) (Signed)
Subjective:    Patient ID: Autumn Carson, female    DOB: 1955/11/24, 60 y.o.   MRN: [id number]  Vic Blackbird, MD  HPI HEARTBURN-WELL CONTROLLED. HAS SQUEEZING DISCOMFORT IN CHEST-CONSTANT. HAD ONE 3-4 MINS AGO. LASTS SECONDS TO ONE IN. CAME BACK TO GI. AT NIGHT IT GETS THE TIGHTEST. JUST GRABS. MOVING LIKE A MUSCLE. TROUBLE SWALLOWING EVERY TIME SH EATS-MOSTLY SOLIDS. BMs: QOD. WEIGHT LOSS: NONE. APPETITE: GOOD.   PT DENIES FEVER, CHILLS, HEMATOCHEZIA, nausea, vomiting, melena, diarrhea, CHEST PAIN, SHORTNESS OF BREATH, CHANGE IN BOWEL IN HABITS, constipation, abdominal pain, problems with sedation, OR heartburn or indigestion.   Past Medical History  Diagnosis Date  . Type 2 diabetes mellitus with diabetic neuropathy ([employer])   . Essential hypertension   . Obesity   . History of thyroid cancer 2011    Papillary thyroid cancer s/p radiation  . Chronic anemia   . GERD (gastroesophageal reflux disease)   . Back pain   . Hypothyroidism   . Anxiety   . Seasonal allergies   . Lumbar spinal stenosis   . Arthritis of both knees   . Cancer [facility])     Thyroid   Past Surgical History  Procedure Laterality Date  . Cesarean section    . Tubal ligation    . Tonsilectomy, adenoidectomy, bilateral myringotomy and tubes  1987  . Carpal tunnel release      4 total, bilaterally  . Rotator cuff repair Bilateral     bilaterally  . Total thyroidectomy  2011  . Elbow surgery Bilateral   . Colonoscopy with esophagogastroduodenoscopy (egd) N/A 12/08/2012    SLF: 1. Normal mucosa in the terminal ileum 2. mild diverticulosis throughout the entire examined colon. 3. rectal bleeding due to moderate sized internal hemorrhoids.   Freda Munro capsule study N/A 12/31/2012    Procedure: GIVENS CAPSULE STUDY;  Surgeon: Danie Binder, MD;  Location: [facility];  Service: Endoscopy;  Laterality: N/A;  7:30  . Enteroscopy N/A 01/16/2013    Procedure: ENTEROSCOPY;  Surgeon: Danie Binder, MD;   Location: [facility];  Service: Endoscopy;  Laterality: N/A;  2:00  . Biopsy N/A 01/16/2013    Procedure: BIOPSY;  Surgeon: Danie Binder, MD;  Location: [facility];  Service: Endoscopy;  Laterality: N/A;  SMALL BOWEL ULCERS  . Cholecystectomy  1990's  . Spine surgery      Lumbar Fusion- Dr. Trenton Gammon   Allergies  Allergen Reactions  . Grapefruit Oil Anaphylaxis    Grape fruit causes Anaphylaxis   Current Outpatient Prescriptions  Medication Sig Dispense Refill  . ACCU-CHEK FASTCLIX  1 Units by Does not apply route daily.    Marland Kitchen albuterol MCG/ACT inhaler Inhale 2 puffs into the lungs every 6 (six) hours as needed for wheezing.    Marland Kitchen aspirin 81 MG tablet Take 81 mg by mouth every other day.    .      . FLEXERIL) 10 MG tablet TAKE 1 TABLET BY MOUTH EVERY 8 HOURS PRN S    .      . FLUoxetine (PROZAC) 40 MG capsule TAKE 1 CAPSULE EVERY DAY BEFORE BREAKFAST     . fluticasone 50 MCG/ACT nasal spray PLACE 2 SPRAYS INTO BOTH NOSTRILS DAILY.    . furosemide (LASIX) 20 MG tablet TAKE 1 TABLET EVERY DAY BEFORE BREAKFAST    . Gabapentin 300 MG capsule TAKE 1 CAPSULE TWICE DAILY    . glucose blood test strip Use as instructed [place] for  Accuchek nano    .  (NORCO/VICODIN) 5-325 MG tablet Take 2 tablets by mouth every 4 (four) hours as needed. <1X/WK   . HYCODAN) 5-1.5 MG/5ML syrup Take 5 mLs by mouth every 8 (eight) hoursPRN for cough.    Marland Kitchen lisinopril 40 MG tablet TAKE 1 TABLET EVERY DAY    . meclizine (ANTIVERT) 25 MG tablet Take 1 tablet (25 mg total) by mouth 3 (three) times daily as needed for dizziness.    Marland Kitchen GLUCOPHAGE) 1000 MG tablet TAKE 1 TABLET TWICE DAILY EVERY MORNING AND EVENING    . TOPROL-XL) 25 MG 24 hr tablet TAKE 1 TABLET EVERY DAY    .      Marland Kitchen PRILOSEC) 40 MG capsule TAKE 1 CAPSULE EVERY DAY    .      . simvastatin (ZOCOR) 40 MG tablet Take 1 tablet (40 mg total) by mouth daily.    Marland Kitchen SYNTHROID 125 MCG tablet 200 mcg.     . zolpidem (AMBIEN) 5 MG tablet TAKE 1 TABLET BY MOUTH EVERY  NIGHT AT BEDTIME PRN     Review of Systems PER HPI OTHERWISE ALL SYSTEMS ARE NEGATIVE.    Objective:   Physical Exam        Assessment & Plan:

## 2015-11-22 NOTE — Interval H&P Note (Signed)
History and Physical Interval Note:  11/22/2015 1:38 PM  Autumn Carson  has presented today for surgery, with the diagnosis of DYSPHAGIA/DYSPEPSIA  The various methods of treatment have been discussed with the patient and family. After consideration of risks, benefits and other options for treatment, the patient has consented to  Procedure(s) with comments: ESOPHAGOGASTRODUODENOSCOPY (EGD) (N/A) - 130 - moved to 2:00 - office to notify pt SAVORY DILATION (N/A) BRAVO PH STUDY (N/A) as a surgical intervention .  The patient's history has been reviewed, patient examined, no change in status, stable for surgery.  I have reviewed the patient's chart and labs.  Questions were answered to the patient's satisfaction.     [employer]

## 2015-11-22 NOTE — Op Note (Signed)
[facility] Patient Name: Autumn Carson Procedure Date: 11/22/2015 1:40 PM MRN: [id number] Date of Birth: 28-Jun-1956 Attending MD: Barney Drain , MD CSN: [id number] Age: 60 Admit Type: Outpatient Procedure:                Upper GI endoscopy Indications:              Dysphagia, Chest pain (non cardiac) Providers:                Barney Drain, MD, Gwenlyn Fudge, RN, Randa Spike,                            Technician Referring MD:             Modena Nunnery. [place] Medicines:                Promethazine 12.5 mg IV, Meperidine 75 mg IV,                            Midazolam 6 mg IV, Atropine 0.5 mg IV Complications:            No immediate complications. Estimated Blood Loss:     Estimated blood loss was minimal. Procedure:                Pre-Anesthesia Assessment:                           - Prior to the procedure, a History and Physical                            was performed, and patient medications and                            allergies were reviewed. The patient's tolerance of                            previous anesthesia was also reviewed. The risks                            and benefits of the procedure and the sedation                            options and risks were discussed with the patient.                            All questions were answered, and informed consent                            was obtained. Prior Anticoagulants: The patient has                            taken ibuprofen, last dose was day of procedure.                            ASA Grade Assessment: II - A patient with mild  systemic disease. After reviewing the risks and                            benefits, the patient was deemed in satisfactory                            condition to undergo the procedure.                           After obtaining informed consent, the endoscope was                            passed under direct vision. Throughout the             procedure, the patient's blood pressure, pulse, and                            oxygen saturations were monitored continuously. The                            [id number]) scope was introduced through the                            mouth, and advanced to the second part of duodenum.                            The upper GI endoscopy was accomplished with ease.                            The patient tolerated the procedure well. Scope In: 2:05:33 PM Scope Out: 2:20:58 PM Total Procedure Duration: 0 hours 15 minutes 25 seconds  Findings:      A web was found in the proximal esophagus. A guidewire was placed and       the scope was withdrawn. Dilation was performed with a Savary dilator       with mild resistance at 16 mm and 17 mm.      Mucosal changes including feline appearance and longitudinal furrows       were found in the distal esophagus. Esophageal findings were graded       using the Eosinophilic Esophagitis Endoscopic Reference Score       (EoE-EREFS) as: Edema Grade 1 Present (decreased clarity or absence of       vascular markings) and Furrows Grade 1 Present (vertical lines with or       without visible depth). Biopsies were obtained from the proximal (23 cm       from incisors) and distal esophagus(37 cm from the incisors) with cold       forceps for histology of suspected eosinophilic esophagitis. GE junction       42 cm from the incisors. BRAVO CAPSULE PLACED 36 CM FROM THE INCISORS.      The entire examined stomach was normal.      The duodenal bulb and second portion of the duodenum were normal. Impression:               - Web in the proximal esophagus. Dilated.                           -  Esophageal mucosal changes suspicious for                            eosinophilic esophagitis. Biopsied.                           - Normal stomach.                           - Normal duodenal bulb and second portion of the                            duodenum. Moderate  Sedation:      Moderate (conscious) sedation was administered by the endoscopy nurse       and supervised by the endoscopist. The following parameters were       monitored: oxygen saturation, heart rate, blood pressure, and response       to care. Total physician intraservice time was 36 minutes. Recommendation:           - Patient has a contact number available for                            emergencies. The signs and symptoms of potential                            delayed complications were discussed with the                            patient. Return to normal activities tomorrow.                            Written discharge instructions were provided to the                            patient.                           DO NOT TAKE DICLOFENAC AND IBUPROFEN IN THE SAME                            DAY. IT CAN CAUSE INTERNAL BLEEDING AND WILL KILL                            YOUR KIDNEYS.                           TAKE YOUR Omeprazole TODAY.                           RECORD EVERYTHING THAT GOES INTO YOUR MOUTH FOR THE                            NEXT 48 HOURS.                           RETURN THE RECORDER ON Thursday AFTER 230 PM.  FOLLOW UP IN 4 MOS.                           - Resume previous diet.                           - Continue present medications.                           - Await pathology results.                           - Return to my office in 4 months. Procedure Code(s):        --- Professional ---                           [phone], Esophagogastroduodenoscopy, flexible,                            transoral; with insertion of guide wire followed by                            passage of dilator(s) through esophagus over guide                            wire                           43239, Esophagogastroduodenoscopy, flexible,                            transoral; with biopsy, single or multiple                           99153, Moderate sedation  services; each additional                            15 minutes intraservice time                           G0500, Moderate sedation services provided by the                            same physician or other qualified health care                            professional performing a gastrointestinal                            endoscopic service that sedation supports,                            requiring the presence of an independent trained                            observer to assist in the monitoring of the  patient's level of consciousness and physiological                            status; initial 15 minutes of intra-service time;                            patient age 61 years or older (additional time may                            be reported with [phone], as appropriate) Diagnosis Code(s):        --- Professional ---                           Q39.4, Esophageal web                           K20.9, Esophagitis, unspecified                           R13.10, Dysphagia, unspecified                           R07.89, Other chest pain CPT copyright 2016 American Medical Association. All rights reserved. The codes documented in this report are preliminary and upon coder review may  be revised to meet current compliance requirements. Barney Drain, MD Barney Drain, MD 11/22/2015 9:25:12 PM This report has been signed electronically. Number of Addenda: 0

## 2015-11-22 NOTE — Discharge Instructions (Signed)
I PLACED A CAPSULE IN YOUR ESOPHAGUS.  IT WILL FALL OFF & PASS IN YOUR STOOL. I biopsied THE BOTTOM AND TOP OF YOUR ESOPHAGUS.    DO NOT TAKE DICLOFENAC AND IBUPROFEN IN THE SAME DAY. IT CAN CAUSE INTERNAL BLEEDING AND WILL KILL YOUR KIDNEYS.  TAKE YOUR Omeprazole TODAY.  RECORD EVERYTHING THAT GOES INTO YOUR MOUTH FOR THE NEXT 48 HOURS.  RETURN THE RECORDER ON Thursday AFTER 230 PM.  YOUR BIOPSY RESULTS WILL BE AVAILABLE IN MY CHART APR 27. MY OFFICE WILL CALL YOU IN 10-14 DAYS WITH YOUR BRAVO & BIOPSY RESULTS.    FOLLOW UP IN 4 MOS.    ENDOSCOPY Care After Read the instructions outlined below and refer to this sheet in the next week. These discharge instructions provide you with general information on caring for yourself after you leave the hospital. While your treatment has been planned according to the most current medical practices available, unavoidable complications occasionally occur. If you have any problems or questions after discharge, call DR. Claxton Levitz, [phone].  ACTIVITY  You may resume your regular activity, but move at a slower pace for the next 24 hours.   Take frequent rest periods for the next 24 hours.   Walking will help get rid of the air and reduce the bloated feeling in your belly (abdomen).   No driving for 24 hours (because of the medicine (anesthesia) used during the test).   You may shower.   Do not sign any important legal documents or operate any machinery for 24 hours (because of the anesthesia used during the test).    NUTRITION  Drink plenty of fluids.   You may resume your normal diet as instructed by your doctor.   Begin with a light meal and progress to your normal diet. Heavy or fried foods are harder to digest and may make you feel sick to your stomach (nauseated).   Avoid alcoholic beverages for 24 hours or as instructed.    MEDICATIONS  You may resume your normal medications.   WHAT YOU CAN EXPECT TODAY  Some feelings of  bloating in the abdomen.   Passage of more gas than usual.   Spotting of blood in your stool or on the toilet paper  .  IF YOU HAD POLYPS REMOVED DURING THE ENDOSCOPY:  Eat a soft diet IF YOU HAVE NAUSEA, BLOATING, ABDOMINAL PAIN, OR VOMITING.    FINDING OUT THE RESULTS OF YOUR TEST Not all test results are available during your visit. DR. Oneida Alar WILL CALL YOU WITHIN 14 DAYS OF YOUR PROCEDUE WITH YOUR RESULTS. Do not assume everything is normal if you have not heard from DR. Jayona Mccaig, CALL HER OFFICE AT [phone].  SEEK IMMEDIATE MEDICAL ATTENTION AND CALL THE OFFICE: [phone] IF:  You have more than a spotting of blood in your stool.   Your belly is swollen (abdominal distention).   You are nauseated or vomiting.   You have a temperature over 101F.   You have abdominal pain or discomfort that is severe or gets worse throughout the day.

## 2015-11-23 DIAGNOSIS — Z85850 Personal history of malignant neoplasm of thyroid: Secondary | ICD-10-CM | POA: Diagnosis not present

## 2015-11-25 ENCOUNTER — Encounter (HOSPITAL_COMMUNITY): Payer: Self-pay | Admitting: Gastroenterology

## 2015-11-30 ENCOUNTER — Other Ambulatory Visit: Payer: Self-pay | Admitting: Family Medicine

## 2015-12-01 ENCOUNTER — Other Ambulatory Visit: Payer: Self-pay | Admitting: Family Medicine

## 2015-12-01 ENCOUNTER — Telehealth: Payer: Self-pay | Admitting: Gastroenterology

## 2015-12-01 NOTE — Telephone Encounter (Signed)
PLEASE CALL PT. HER BRAVO STUDY SHOWS OMEPRAZOLE CONTROLS HIS ACID REFLUX. HER SYMPTOMS ARE UNCONTROLLED DUE TO TEA, FRIED FOOD, AND COFFEE.  PLAN: 1. CONTINUE YOUR WEIGHT LOSS EFFORTS. 2. AVOID REFLUX TRIGGERS 3. CONTINUE OMEPRAZOLE.  TAKE 30 MINUTES PRIOR TO YOUR FIRST MEAL. 4. OPV IN AUG 2017 E30 GERD, DYSPHAGIA.

## 2015-12-01 NOTE — Telephone Encounter (Signed)
Pt is aware of results. 

## 2015-12-01 NOTE — Telephone Encounter (Signed)
Refill appropriate and filled per protocol. 

## 2015-12-01 NOTE — Procedures (Signed)
FOOD DIARY: TEA, FREQUENTLY, COFFEE, HAS BROWN, HAMBURGER, FRIES, FISH, MINT CANDY   PROCEDURE:  Procedure(s): BRAVO PH STUDY ON OMEPRAZOLE 40 MG DAILY  SURGEON:  Surgeon(s): Dorothyann Peng, MD  FINDINGS:  PT HAD RECORDER-STUDY DURATION: 1 DAY 20:24.   FRACTION Ph TOTAL: 3.7   # OF REFLUXES: 37(DA1 31, DAY 2 6)   LONG REFLUX > 5 MINS: 2   LONGEST REFLUX: 73 MINS   REFLUX TABLE: UPRIGHT 36 EPISODE, SUPINE 1 POSTPRANDIAL 14  DEMEESTER SCORE DAY 1:   24.5 (NL < 14.72)  DEMEESTER SCORE DAY 2:  1.2  SYMPTOM ASSOCIATION PROBABILITY ACID REFLUX ANALYSIS        SUPINE 100         DRINK 96  DIAGNOSIS: ACID REFLUX CONTROLLED WITH PPI  PLAN: 1. AVOID REFLUX TRIGGERS 2. CONTINUE OMEPRAZOLE 3. OPV AUG 2017

## 2015-12-02 ENCOUNTER — Other Ambulatory Visit: Payer: Self-pay | Admitting: Family Medicine

## 2015-12-02 NOTE — Telephone Encounter (Signed)
Refill appropriate and filled per protocol. 

## 2015-12-02 NOTE — Telephone Encounter (Signed)
OV made °

## 2015-12-11 MED ORDER — OMEPRAZOLE 40 MG PO CPDR: DELAYED_RELEASE_CAPSULE | ORAL | Status: DC

## 2015-12-11 NOTE — Telephone Encounter (Addendum)
Please call pt. HeR ESOPHAGEAL BIOPSIES CONFIRM HER SWALLOWING PROBLEMS IS DUE TO UNCONTROLLED REFLUX.  INCREASE OMEPRAZOLE.  TAKE 30 MINUTES PRIOR TO YOUR MEALS TWICE DAILY.  DO NOT TAKE DICLOFENAC AND IBUPROFEN IN THE SAME DAY. IT CAN CAUSE INTERNAL BLEEDING AND WILL KILL YOUR KIDNEYS.    FOLLOW UP IN 4 MOS E30 GERD/DYSPHAGIA.

## 2015-12-11 NOTE — Addendum Note (Signed)
Addended by: Danie Binder on: 12/11/2015 02:11 PM   Modules accepted: Orders

## 2015-12-12 NOTE — Telephone Encounter (Signed)
PT is aware.

## 2015-12-21 ENCOUNTER — Telehealth: Payer: Self-pay

## 2015-12-21 DIAGNOSIS — E890 Postprocedural hypothyroidism: Secondary | ICD-10-CM | POA: Diagnosis not present

## 2015-12-21 NOTE — Telephone Encounter (Signed)
T/C from [place] confirming the bid dosing of Prilosec 40 mg. I told him that Dr. Oneida Alar did the prescription on 12/11/2015 for bid.

## 2015-12-21 NOTE — Telephone Encounter (Signed)
REVIEWED. AGREE. NO ADDITIONAL RECOMMENDATIONS. 

## 2015-12-23 NOTE — Telephone Encounter (Signed)
Paperwork complete MAY 25.

## 2016-01-05 ENCOUNTER — Other Ambulatory Visit: Payer: Self-pay | Admitting: Family Medicine

## 2016-01-05 ENCOUNTER — Telehealth: Payer: Self-pay | Admitting: Family Medicine

## 2016-01-05 NOTE — Telephone Encounter (Signed)
Received fax from [place] back Auth# [id number] Treating Provider Dr. Armandina Gemma # of Visits 6 Start Date 11/23/15 End Date 05/23/2016 CPT 99494 DX E89.0

## 2016-01-05 NOTE — Telephone Encounter (Signed)
Refill appropriate and filled per protocol. 

## 2016-02-01 ENCOUNTER — Other Ambulatory Visit: Payer: Self-pay | Admitting: Family Medicine

## 2016-02-01 NOTE — Telephone Encounter (Signed)
Refill appropriate and filled per protocol. 

## 2016-03-15 ENCOUNTER — Ambulatory Visit: Payer: Self-pay | Admitting: Gastroenterology

## 2016-03-19 ENCOUNTER — Other Ambulatory Visit: Payer: Self-pay | Admitting: Family Medicine

## 2016-03-21 NOTE — Telephone Encounter (Signed)
Okay to refill? 

## 2016-03-21 NOTE — Telephone Encounter (Signed)
Ok to refill??  Last office visit 10/20/2015.  Last refill 08/08/2015, #3 refills.

## 2016-03-21 NOTE — Telephone Encounter (Signed)
Medication called to pharmacy. 

## 2016-04-06 ENCOUNTER — Ambulatory Visit (INDEPENDENT_AMBULATORY_CARE_PROVIDER_SITE_OTHER): Payer: Commercial Managed Care - HMO | Admitting: Family Medicine

## 2016-04-06 ENCOUNTER — Encounter: Payer: Self-pay | Admitting: Family Medicine

## 2016-04-06 VITALS — BP 132/78 | HR 76 | Temp 98.6°F | Resp 16 | Ht 66.0 in | Wt 311.0 lb

## 2016-04-06 DIAGNOSIS — K529 Noninfective gastroenteritis and colitis, unspecified: Secondary | ICD-10-CM

## 2016-04-06 DIAGNOSIS — R42 Dizziness and giddiness: Secondary | ICD-10-CM

## 2016-04-06 DIAGNOSIS — E785 Hyperlipidemia, unspecified: Secondary | ICD-10-CM

## 2016-04-06 DIAGNOSIS — E1143 Type 2 diabetes mellitus with diabetic autonomic (poly)neuropathy: Secondary | ICD-10-CM | POA: Diagnosis not present

## 2016-04-06 DIAGNOSIS — I10 Essential (primary) hypertension: Secondary | ICD-10-CM

## 2016-04-06 LAB — CBC WITH DIFFERENTIAL/PLATELET
BASOS PCT: 0 %
Basophils Absolute: 0 cells/uL (ref 0–200)
Eosinophils Absolute: 147 cells/uL (ref 15–500)
Eosinophils Relative: 3 %
HEMATOCRIT: 32.4 % — AB (ref 35.0–45.0)
Hemoglobin: 10 g/dL — ABNORMAL LOW (ref 12.0–15.0)
LYMPHS PCT: 30 %
Lymphs Abs: 1470 cells/uL (ref 850–3900)
MCH: 23.7 pg — ABNORMAL LOW (ref 27.0–33.0)
MCHC: 30.9 g/dL — ABNORMAL LOW (ref 32.0–36.0)
MCV: 76.8 fL — ABNORMAL LOW (ref 80.0–100.0)
MONO ABS: 294 {cells}/uL (ref 200–950)
MPV: 10.4 fL (ref 7.5–12.5)
Monocytes Relative: 6 %
Neutro Abs: 2989 cells/uL (ref 1500–7800)
Neutrophils Relative %: 61 %
PLATELETS: 256 10*3/uL (ref 140–400)
RBC: 4.22 MIL/uL (ref 3.80–5.10)
RDW: 17 % — AB (ref 11.0–15.0)
WBC: 4.9 10*3/uL (ref 3.8–10.8)

## 2016-04-06 LAB — COMPREHENSIVE METABOLIC PANEL
ALK PHOS: 57 U/L (ref 33–130)
ALT: 10 U/L (ref 6–29)
AST: 13 U/L (ref 10–35)
Albumin: 4.2 g/dL (ref 3.6–5.1)
BILIRUBIN TOTAL: 0.3 mg/dL (ref 0.2–1.2)
BUN: 11 mg/dL (ref 7–25)
CALCIUM: 9.1 mg/dL (ref 8.6–10.4)
CO2: 28 mmol/L (ref 20–31)
Chloride: 103 mmol/L (ref 98–110)
Creat: 0.92 mg/dL (ref 0.50–1.05)
GLUCOSE: 109 mg/dL — AB (ref 70–99)
Potassium: 4.3 mmol/L (ref 3.5–5.3)
SODIUM: 141 mmol/L (ref 135–146)
Total Protein: 6.8 g/dL (ref 6.1–8.1)

## 2016-04-06 LAB — LIPID PANEL
Cholesterol: 185 mg/dL (ref 125–200)
HDL: 52 mg/dL (ref >=46)
LDL Cholesterol: 109 mg/dL (ref <130)
Total CHOL/HDL Ratio: 3.6 Ratio (ref <=5.0)
Triglycerides: 121 mg/dL (ref <150)
VLDL: 24 mg/dL (ref <30)

## 2016-04-06 MED ORDER — PROMETHAZINE HCL 12.5 MG PO TABS: 12.5000 mg | ORAL_TABLET | Freq: Three times a day (TID) | ORAL | 0 refills | Status: DC | PRN

## 2016-04-06 MED ORDER — PROMETHAZINE HCL 25 MG/ML IJ SOLN
25.0000 mg | Freq: Once | INTRAMUSCULAR | Status: AC
  Administered 2016-04-06: 25 mg via INTRAMUSCULAR

## 2016-04-06 NOTE — Patient Instructions (Signed)
Take phengeran as prescribed Take meclizine three times a day  F/U 3 months

## 2016-04-06 NOTE — Progress Notes (Signed)
   Subjective:    Patient ID: Autumn Carson, female    DOB: [birth year], 58 y.o.   MRN: [id number]  Patient presents for Illness (x4 days- vertigo, diarrhea, nausea)   Pt here with dizziness , NAUSEA /Vomting, diarrhea. Started after a cookout, felt light headed and nauseous. Tuesday AM had emesis NB/NB and diarrhea, 2-3 times, dizziness persisted worse when getting up, diarrhea resolved after 2 days, no Emesis today but still Nauseous and dizzy. Feels like some of her vertigo, took 1 meclizine but did not help. No fever, no URI symptoms, no other sick contacts, no pain.   She has been working with endocrine on her thyroid, levels have been fluctuating   Last night ate a little pizza, strawberries, fruit.  Review Of Systems:  GEN- denies fatigue, fever, weight loss,weakness, recent illness HEENT- denies eye drainage, change in vision, nasal discharge, CVS- denies chest pain, palpitations RESP- denies SOB, cough, wheeze ABD-+ N/V, change in stools, abd pain GU- denies dysuria, hematuria, dribbling, incontinence MSK- denies joint pain, muscle aches, injury Neuro- denies headache,+ dizziness, syncope, seizure activity       Objective:    BP 132/78 (BP Location: Left Arm, Patient Position: Sitting, Cuff Size: Large)   Pulse 76   Temp 98.6 F (37 C) (Oral)   Resp 16   Ht 5\' 6"  (1.676 m)   Wt (!) 311 lb (141.1 kg)   LMP 09/07/2011   BMI 50.20 kg/m  GEN- NAD, alert and oriented x3 HEENT- PERRL, EOMI, non injected sclera, pink conjunctiva, MMM, oropharynx clear Neck- Supple, no thyromegaly CVS- RRR, no murmur RESP-CTAB ABD-NABS,soft,NT,ND Neuro-CNII-XII intact grossly, no nystagmus, no focal deficits  EXT- No edema Pulses- Radial, DP- 2+        Assessment & Plan:      Problem List Items Addressed This Visit    Hyperlipidemia   Relevant Orders   Lipid panel (Completed)   Essential hypertension - Primary    Controlled no hypotension Due for A1C and fasting  labs for hyperlipidemia so will go ahead and obtain today  Goal A1C less than 7%       Relevant Orders   CBC with Differential/Platelet (Completed)   Comprehensive metabolic panel (Completed)   Diabetes mellitus, type II (HCC)   Relevant Orders   Hemoglobin A1c (Completed)    Other Visit Diagnoses    Dizziness and giddiness       May have been a gastroenteris, no fever and diarrhea resolved, does not appear dehydrated, this could have set off her vertigo?. Well appearing today, despite ongoing nausea- advised bland diet, given shot of phenergan and some tabs, advised can use meclzine up to TID if needed for worsening dizzy/vertigo symptoms, I suspect since things are much improved from beginning of week will resolve without any further intervention, if not pt to call   Gastroenteritis       Relevant Medications   promethazine (PHENERGAN) injection 25 mg (Completed)      Note: This dictation was prepared with Dragon dictation along with smaller phrase technology. Any transcriptional errors that result from this process are unintentional.

## 2016-04-07 ENCOUNTER — Encounter: Payer: Self-pay | Admitting: Family Medicine

## 2016-04-07 LAB — HEMOGLOBIN A1C
HEMOGLOBIN A1C: 6.4 % — AB (ref <5.7)
MEAN PLASMA GLUCOSE: 137 mg/dL

## 2016-04-07 NOTE — Assessment & Plan Note (Signed)
Controlled no hypotension Due for A1C and fasting labs for hyperlipidemia so will go ahead and obtain today  Goal A1C less than 7%

## 2016-04-09 ENCOUNTER — Other Ambulatory Visit: Payer: Self-pay | Admitting: Family Medicine

## 2016-04-11 ENCOUNTER — Ambulatory Visit (INDEPENDENT_AMBULATORY_CARE_PROVIDER_SITE_OTHER): Payer: Commercial Managed Care - HMO | Admitting: Family Medicine

## 2016-04-11 ENCOUNTER — Encounter: Payer: Self-pay | Admitting: Family Medicine

## 2016-04-11 VITALS — BP 140/78 | HR 68 | Temp 98.6°F | Resp 16 | Wt 308.0 lb

## 2016-04-11 DIAGNOSIS — H8113 Benign paroxysmal vertigo, bilateral: Secondary | ICD-10-CM

## 2016-04-11 MED ORDER — DIAZEPAM 5 MG PO TABS: 5.0000 mg | ORAL_TABLET | Freq: Two times a day (BID) | ORAL | 0 refills | Status: DC | PRN

## 2016-04-11 NOTE — Progress Notes (Signed)
   Subjective:    Patient ID: Autumn Carson, female    DOB: September 26, 1955, 60 y.o.   MRN: [id number]  Patient presents for Follow-up (Increased dizziness and HA - no vomiting but some nausea)   Pt here with continued vertigo symptoms. Seen last week for same possible viral etiology that started it. DId well over weekend symptoms slowly improved, felt well Sunday no dizziness, states she was "shouting in church" and afterwards spell came on. If she moves her head quickly or bend over she gets dizzy. It is no longer constant like before. Mild HA in occiput but no change in vision, minimal nausea only if spell occurs. She took meclizine, however it is expired by 2 months, staes medication looked fine  No other new symptoms   No current dizziness  Review Of Systems:  GEN- denies fatigue, fever, weight loss,weakness, recent illness HEENT- denies eye drainage, change in vision, nasal discharge, CVS- denies chest pain, palpitations RESP- denies SOB, cough, wheeze ABD- denies N/V, change in stools, abd pain GU- denies dysuria, hematuria, dribbling, incontinence MSK- denies joint pain, muscle aches, injury Neuro- denies headache+, dizziness, syncope, seizure activity       Objective:    BP 140/78   Pulse 68   Temp 98.6 F (37 C) (Oral)   Resp 16   Wt (!) 308 lb (139.7 kg)   LMP 09/07/2011   BMI 49.71 kg/m  GEN- NAD, alert and oriented x3 HEENT- PERRL, EOMI, non injected sclera, pink conjunctiva, MMM, oropharynx clear Neck- Supple, no bruit CVS- RRR, no murmur RESP-CTAB Neuro-CNII-XII intact grossly, no nystagmus, no focal deficits  EXT- No edema       Assessment & Plan:      Problem List Items Addressed This Visit    None    Visit Diagnoses    Benign paroxysmal positional vertigo, bilateral    -  Primary   residual Vertigo, much improved. Meclizine likley okay, but she would like to try something else. Trial of Valium BID, Image if not improved       Note: This  dictation was prepared with Dragon dictation along with smaller phrase technology. Any transcriptional errors that result from this process are unintentional.

## 2016-04-11 NOTE — Patient Instructions (Signed)
F/U as needed Take valium twice a day for dizziness

## 2016-04-15 ENCOUNTER — Encounter (HOSPITAL_COMMUNITY): Payer: Self-pay

## 2016-04-15 ENCOUNTER — Emergency Department (HOSPITAL_COMMUNITY): Payer: Commercial Managed Care - HMO

## 2016-04-15 ENCOUNTER — Observation Stay (HOSPITAL_COMMUNITY)
Admission: EM | Admit: 2016-04-15 | Discharge: 2016-04-17 | Disposition: A | Discharge status: Home / Self Care | Payer: Commercial Managed Care - HMO | Attending: Internal Medicine | Admitting: Internal Medicine

## 2016-04-15 ENCOUNTER — Observation Stay (HOSPITAL_COMMUNITY): Payer: Commercial Managed Care - HMO

## 2016-04-15 DIAGNOSIS — R001 Bradycardia, unspecified: Secondary | ICD-10-CM | POA: Insufficient documentation

## 2016-04-15 DIAGNOSIS — R42 Dizziness and giddiness: Secondary | ICD-10-CM | POA: Diagnosis not present

## 2016-04-15 DIAGNOSIS — K219 Gastro-esophageal reflux disease without esophagitis: Secondary | ICD-10-CM | POA: Insufficient documentation

## 2016-04-15 DIAGNOSIS — F419 Anxiety disorder, unspecified: Secondary | ICD-10-CM | POA: Insufficient documentation

## 2016-04-15 DIAGNOSIS — R531 Weakness: Secondary | ICD-10-CM | POA: Insufficient documentation

## 2016-04-15 DIAGNOSIS — E785 Hyperlipidemia, unspecified: Secondary | ICD-10-CM | POA: Diagnosis not present

## 2016-04-15 DIAGNOSIS — E039 Hypothyroidism, unspecified: Secondary | ICD-10-CM | POA: Diagnosis not present

## 2016-04-15 DIAGNOSIS — Z923 Personal history of irradiation: Secondary | ICD-10-CM | POA: Diagnosis not present

## 2016-04-15 DIAGNOSIS — I10 Essential (primary) hypertension: Secondary | ICD-10-CM | POA: Diagnosis not present

## 2016-04-15 DIAGNOSIS — Z85850 Personal history of malignant neoplasm of thyroid: Secondary | ICD-10-CM | POA: Insufficient documentation

## 2016-04-15 DIAGNOSIS — D649 Anemia, unspecified: Secondary | ICD-10-CM | POA: Diagnosis not present

## 2016-04-15 DIAGNOSIS — Z23 Encounter for immunization: Secondary | ICD-10-CM | POA: Insufficient documentation

## 2016-04-15 DIAGNOSIS — E1140 Type 2 diabetes mellitus with diabetic neuropathy, unspecified: Secondary | ICD-10-CM | POA: Diagnosis not present

## 2016-04-15 DIAGNOSIS — E119 Type 2 diabetes mellitus without complications: Secondary | ICD-10-CM

## 2016-04-15 DIAGNOSIS — Z6841 Body Mass Index (BMI) 40.0 and over, adult: Secondary | ICD-10-CM | POA: Diagnosis not present

## 2016-04-15 DIAGNOSIS — Z7982 Long term (current) use of aspirin: Secondary | ICD-10-CM | POA: Insufficient documentation

## 2016-04-15 DIAGNOSIS — Z7984 Long term (current) use of oral hypoglycemic drugs: Secondary | ICD-10-CM | POA: Insufficient documentation

## 2016-04-15 DIAGNOSIS — T783XXA Angioneurotic edema, initial encounter: Principal | ICD-10-CM | POA: Insufficient documentation

## 2016-04-15 DIAGNOSIS — G459 Transient cerebral ischemic attack, unspecified: Secondary | ICD-10-CM

## 2016-04-15 LAB — TYPE AND SCREEN
ABO/RH(D): AB NEG
Antibody Screen: NEGATIVE

## 2016-04-15 LAB — CBC
HEMATOCRIT: 34.8 % — AB (ref 36.0–46.0)
Hemoglobin: 10.3 g/dL — ABNORMAL LOW (ref 12.0–15.0)
MCH: 23.4 pg — ABNORMAL LOW (ref 26.0–34.0)
MCHC: 29.6 g/dL — ABNORMAL LOW (ref 30.0–36.0)
MCV: 79.1 fL (ref 78.0–100.0)
PLATELETS: 235 10*3/uL (ref 150–400)
RBC: 4.4 MIL/uL (ref 3.87–5.11)
RDW: 16.9 % — ABNORMAL HIGH (ref 11.5–15.5)
WBC: 4.4 10*3/uL (ref 4.0–10.5)

## 2016-04-15 LAB — URINALYSIS, ROUTINE W REFLEX MICROSCOPIC
Bilirubin Urine: NEGATIVE
GLUCOSE, UA: NEGATIVE mg/dL
Ketones, ur: NEGATIVE mg/dL
NITRITE: POSITIVE — AB
PH: 6 (ref 5.0–8.0)
PROTEIN: NEGATIVE mg/dL
Specific Gravity, Urine: 1.017 (ref 1.005–1.030)

## 2016-04-15 LAB — HEPATIC FUNCTION PANEL
ALBUMIN: 4 g/dL (ref 3.5–5.0)
ALT: 11 U/L — ABNORMAL LOW (ref 14–54)
AST: 13 U/L — AB (ref 15–41)
Alkaline Phosphatase: 64 U/L (ref 38–126)
BILIRUBIN TOTAL: 0.5 mg/dL (ref 0.3–1.2)
TOTAL PROTEIN: 7 g/dL (ref 6.5–8.1)

## 2016-04-15 LAB — BASIC METABOLIC PANEL
Anion gap: 9 (ref 5–15)
BUN: 13 mg/dL (ref 6–20)
CHLORIDE: 105 mmol/L (ref 101–111)
CO2: 27 mmol/L (ref 22–32)
Calcium: 9.1 mg/dL (ref 8.9–10.3)
Creatinine, Ser: 0.95 mg/dL (ref 0.44–1.00)
GFR calc non Af Amer: >60 mL/min (ref >60)
Glucose, Bld: 112 mg/dL — ABNORMAL HIGH (ref 65–99)
POTASSIUM: 4.1 mmol/L (ref 3.5–5.1)
SODIUM: 141 mmol/L (ref 135–145)

## 2016-04-15 LAB — TROPONIN I: Troponin I: <0.03 ng/mL (ref <0.03)

## 2016-04-15 LAB — URINE MICROSCOPIC-ADD ON

## 2016-04-15 LAB — TSH: TSH: 0.304 u[IU]/mL — AB (ref 0.350–4.500)

## 2016-04-15 MED ORDER — SODIUM CHLORIDE 0.9 % IV SOLN
INTRAVENOUS | Status: DC
  Administered 2016-04-15: 23:00:00 via INTRAVENOUS

## 2016-04-15 MED ORDER — DIPHENHYDRAMINE HCL 50 MG/ML IJ SOLN
25.0000 mg | Freq: Once | INTRAMUSCULAR | Status: AC
  Administered 2016-04-15: 25 mg via INTRAVENOUS
  Filled 2016-04-15: qty 1

## 2016-04-15 MED ORDER — SODIUM CHLORIDE 0.9 % IV SOLN
INTRAVENOUS | Status: DC
Start: 2016-04-15 — End: 2016-04-16
  Administered 2016-04-15: 22:00:00 via INTRAVENOUS

## 2016-04-15 MED ORDER — MECLIZINE HCL 25 MG PO TABS
25.0000 mg | ORAL_TABLET | Freq: Once | ORAL | Status: AC
  Administered 2016-04-15: 25 mg via ORAL
  Filled 2016-04-15: qty 1

## 2016-04-15 MED ORDER — FAMOTIDINE IN NACL 20-0.9 MG/50ML-% IV SOLN
20.0000 mg | Freq: Once | INTRAVENOUS | Status: AC
  Administered 2016-04-15: 20 mg via INTRAVENOUS
  Filled 2016-04-15: qty 50

## 2016-04-15 MED ORDER — LEVOTHYROXINE SODIUM 100 MCG IV SOLR
62.5000 ug | Freq: Every day | INTRAVENOUS | Status: DC
  Administered 2016-04-16: 62.5 ug via INTRAVENOUS
  Filled 2016-04-15: qty 5

## 2016-04-15 MED ORDER — METHYLPREDNISOLONE SODIUM SUCC 125 MG IJ SOLR
125.0000 mg | Freq: Once | INTRAMUSCULAR | Status: AC
  Administered 2016-04-15: 125 mg via INTRAVENOUS
  Filled 2016-04-15: qty 2

## 2016-04-15 MED ORDER — DIAZEPAM 5 MG PO TABS
5.0000 mg | ORAL_TABLET | Freq: Once | ORAL | Status: AC
  Administered 2016-04-15: 5 mg via ORAL
  Filled 2016-04-15: qty 1

## 2016-04-15 MED ORDER — FAMOTIDINE IN NACL 20-0.9 MG/50ML-% IV SOLN
20.0000 mg | Freq: Two times a day (BID) | INTRAVENOUS | Status: DC
  Administered 2016-04-16: 20 mg via INTRAVENOUS
  Filled 2016-04-15: qty 50

## 2016-04-15 MED ORDER — METHYLPREDNISOLONE SODIUM SUCC 40 MG IJ SOLR
40.0000 mg | Freq: Two times a day (BID) | INTRAMUSCULAR | Status: AC
  Administered 2016-04-16 (×2): 40 mg via INTRAVENOUS
  Filled 2016-04-15 (×2): qty 1

## 2016-04-15 MED ORDER — C1 ESTERASE INHIBITOR (HUMAN) 500 UNITS IV KIT
20.0000 [IU]/kg | PACK | Freq: Once | INTRAVENOUS | Status: AC
  Administered 2016-04-15: 3000 [IU] via INTRAVENOUS
  Filled 2016-04-15: qty 3000

## 2016-04-15 MED ORDER — HYDRALAZINE HCL 20 MG/ML IJ SOLN: 10.0000 mg | INTRAMUSCULAR | Status: DC | PRN

## 2016-04-15 NOTE — ED Notes (Signed)
Nurse will get labs 

## 2016-04-15 NOTE — ED Triage Notes (Addendum)
Patient here with positional dizziness since labor day. Has been seen by her MD x 2 and taking meds and has had labs with no relief. Feels like the room spinning when she moves. Alert and oriented, no neuro deficits. States headache with same and emesis this am with movement

## 2016-04-15 NOTE — H&P (Signed)
History and Physical    Autumn Carson [id number] DOB: Mar 26, 1956 DOA: 04/15/2016  PCP: Vic Blackbird, MD  Patient coming from: Home.  Chief Complaint: Dizziness.  HPI: Autumn Carson is a 60 y.o. female with diabetes mellitus type 2, hypertension, hypothyroidism, hyperlipidemia presents to the ER because of persistent dizziness. Patient's dizziness started on September 4 at that time it was associated with nausea vomiting and diarrhea. Patient's diarrhea lasted for 3 days. Patient's dizziness persisted and patient had gone to her PCP twice and was eventually placed on meclizine and Valium despite which patient was still feeling dizzy. Patient's dizziness is only when she tries to move. In the ER patient start developing throat tightness and 1 exam was having swelling of the soft palate and uvula. Patient was given Solu-Medrol Benadryl and Pepcid despite which patient was still having swelling. Patient was able to talk and denies any difficulty breathing. Patient is being admitted for angioedema. The only medications patient received in the ER was meclizine and Valium. Patient is on lisinopril for more than 10 years now. Denies any other new medications. Denies any itching or skin rash.  ED Course: Patient was given Solu-Medrol Pepcid and Benadryl.  Review of Systems: As per HPI, rest all negative.   Past Medical History:  Diagnosis Date  . Anxiety   . Arthritis of both knees   . Back pain   . Cancer ([place])    Thyroid  . Chronic anemia   . Essential hypertension   . GERD (gastroesophageal reflux disease)   . History of thyroid cancer 2011   Papillary thyroid cancer s/p radiation  . Hypothyroidism   . Lumbar spinal stenosis   . Obesity   . Seasonal allergies   . Type 2 diabetes mellitus with diabetic neuropathy [facility])     Past Surgical History:  Procedure Laterality Date  . BIOPSY N/A 01/16/2013   Procedure: BIOPSY;  Surgeon: Danie Binder, MD;  [facility];  Service: Endoscopy;  Laterality: N/A;  SMALL BOWEL ULCERS  . BRAVO [place] STUDY N/A 11/22/2015   Procedure: BRAVO [place];  Surgeon: Danie Binder, MD;  [facility];  Service: Endoscopy;  Laterality: N/A;  . CARPAL TUNNEL RELEASE     4 total, bilaterally  . CESAREAN SECTION    . CHOLECYSTECTOMY  1990's  . COLONOSCOPY WITH ESOPHAGOGASTRODUODENOSCOPY (EGD) N/A 12/08/2012   SLF: 1. Normal mucosa in the terminal ileum 2. mild diverticulosis throughout the entire examined colon. 3. rectal bleeding due to moderate sized internal hemorrhoids.   . ELBOW SURGERY Bilateral   . ENTEROSCOPY N/A 01/16/2013   Procedure: ENTEROSCOPY;  Surgeon: Danie Binder, MD;  [facility];  Service: Endoscopy;  Laterality: N/A;  2:00  . ESOPHAGOGASTRODUODENOSCOPY N/A 11/22/2015   Procedure: ESOPHAGOGASTRODUODENOSCOPY (EGD);  Surgeon: Danie Binder, MD;  [facility];  Service: Endoscopy;  Laterality: N/A;  130 - moved to 2:00 - office to notify pt  . GIVENS CAPSULE STUDY N/A 12/31/2012   Procedure: GIVENS CAPSULE STUDY;  Surgeon: Danie Binder, MD;  [facility];  Service: Endoscopy;  Laterality: N/A;  7:30  . ROTATOR CUFF REPAIR Bilateral    bilaterally  . SAVORY DILATION N/A 11/22/2015   Procedure: SAVORY DILATION;  Surgeon: Danie Binder, MD;  [facility];  Service: Endoscopy;  Laterality: N/A;  . SPINE SURGERY     Lumbar Fusion- Dr. Trenton Gammon  . TONSILECTOMY, ADENOIDECTOMY, BILATERAL MYRINGOTOMY AND  TUBES  1987  . TOTAL THYROIDECTOMY  2011  . TUBAL LIGATION       reports that she has never smoked. She has never used smokeless tobacco. She reports that she does not drink alcohol or use drugs.  Allergies  Allergen Reactions  . Grapefruit Oil Anaphylaxis    Grape fruit causes Anaphylaxis  . Lisinopril Anaphylaxis    angioedema    Family History  Problem Relation Age of Onset  . Heart disease Mother   . Lung cancer Father     Age 83  .  Colon cancer Brother     Age 54  . Heart attack Daughter 87  . Hypertension Daughter   . Other Brother     Agent orange, age 45, cancer    Prior to Admission medications   Medication Sig Start Date End Date Taking? Authorizing Provider  albuterol (PROVENTIL HFA;VENTOLIN HFA) 108 (90 BASE) MCG/ACT inhaler Inhale 2 puffs into the lungs every 6 (six) hours as needed for wheezing. 08/20/13   Alycia Rossetti, MD  aspirin 81 MG tablet Take 81 mg by mouth every other day.    Historical Provider, MD  cyclobenzaprine (FLEXERIL) 10 MG tablet TAKE 1 TABLET BY MOUTH EVERY 8 HOURS AS NEEDED FOR MUSCLE SPASMS 07/19/15   Alycia Rossetti, MD  diazepam (VALIUM) 5 MG tablet Take 1 tablet (5 mg total) by mouth every 12 (twelve) hours as needed for anxiety. 04/11/16   Alycia Rossetti, MD  diclofenac sodium (VOLTAREN) 1 % GEL Apply 2 g topically 4 (four) times daily. 04/15/15   Alycia Rossetti, MD  FLUoxetine (PROZAC) 40 MG capsule TAKE 1 CAPSULE EVERY DAY BEFORE BREAKFAST (REQUIRE OFFICE VISIT BEFORE ANY FURTHER REFILLS CAN BE GIVEN) 01/05/16   Susy Frizzle, MD  fluticasone (FLONASE) 50 MCG/ACT nasal spray PLACE 2 SPRAYS INTO BOTH NOSTRILS DAILY.    Alycia Rossetti, MD  furosemide (LASIX) 20 MG tablet TAKE 1 TABLET EVERY DAY BEFORE BREAKFAST 02/01/16   Alycia Rossetti, MD  gabapentin (NEURONTIN) 300 MG capsule TAKE 1 CAPSULE TWICE DAILY 02/01/16   Alycia Rossetti, MD  meclizine (ANTIVERT) 25 MG tablet Take 1 tablet (25 mg total) by mouth 3 (three) times daily as needed for dizziness. 01/12/15   Alycia Rossetti, MD  metFORMIN (GLUCOPHAGE) 1000 MG tablet TAKE 1 TABLET TWICE DAILY EVERY MORNING AND EVENING 12/02/15   Alycia Rossetti, MD  metoprolol succinate (TOPROL-XL) 25 MG 24 hr tablet TAKE 1 TABLET EVERY DAY 10/10/15   Alycia Rossetti, MD  naproxen (NAPROSYN) 500 MG tablet TAKE 1 TABLET TWICE DAILY WITH MEALS 02/01/16   Alycia Rossetti, MD  omeprazole (PRILOSEC) 40 MG capsule 1 PO 30 MINS PRIOR TO MEALS BID  12/11/15   Danie Binder, MD  promethazine (PHENERGAN) 12.5 MG tablet Take 1 tablet (12.5 mg total) by mouth every 8 (eight) hours as needed for nausea or vomiting. 04/06/16   Alycia Rossetti, MD  simvastatin (ZOCOR) 40 MG tablet Take 1 tablet (40 mg total) by mouth daily. 07/19/15   Alycia Rossetti, MD  SYNTHROID 125 MCG tablet Take 250 mcg by mouth daily before breakfast.  01/14/15   Historical Provider, MD  zolpidem (AMBIEN) 5 MG tablet TAKE 1 TABLET BY MOUTH EVERY NIGHT AT BEDTIME AS NEEDED FOR SLEEP 03/21/16   Alycia Rossetti, MD    Physical Exam: Vitals:   04/15/16 1644 04/15/16 1800 04/15/16 1830 04/15/16 1900  BP: 163/83 110/59 118/65 132/76  Pulse: 65 (!) 50 (!) 55 (!) 51  Resp: 19 17 14 16   Temp:      TempSrc:      SpO2: 100% 98% 98% 98%      Constitutional: Not in distress. Vitals:   04/15/16 1644 04/15/16 1800 04/15/16 1830 04/15/16 1900  BP: 163/83 110/59 118/65 132/76  Pulse: 65 (!) 50 (!) 55 (!) 51  Resp: 19 17 14 16   Temp:      TempSrc:      SpO2: 100% 98% 98% 98%   Eyes: Anicteric. no pallor. ENMT: Patient's uvula and soft palate is swollen. Neck: No stridor. Respiratory: No rhonchi or crepitations. Cardiovascular: S1 and S2 heard. Abdomen: Soft nontender bowel sounds present. Musculoskeletal: No edema. Skin: No rash. Neurologic: Alert awake oriented to time place and person. Moves all extremities. Psychiatric: Appears normal.   Labs on Admission: I have personally reviewed following labs and imaging studies  CBC:  Recent Labs Lab 04/15/16 1126  WBC 4.4  HGB 10.3*  HCT 34.8*  MCV 79.1  PLT [id number]   Basic Metabolic Panel:  Recent Labs Lab 04/15/16 1126  NA 141  K 4.1  CL 105  CO2 27  GLUCOSE 112*  BUN 13  CREATININE 0.95  CALCIUM 9.1   GFR: Estimated Creatinine Clearance: 93.2 mL/min (by C-G formula based on SCr of 0.95 mg/dL). Liver Function Tests:  Recent Labs Lab 04/15/16 1719  AST 13*  ALT 11*  ALKPHOS 64  BILITOT 0.5    PROT 7.0  ALBUMIN 4.0   No results for input(s): LIPASE, AMYLASE in the last 168 hours. No results for input(s): AMMONIA in the last 168 hours. Coagulation Profile: No results for input(s): INR, PROTIME in the last 168 hours. Cardiac Enzymes:  Recent Labs Lab 04/15/16 1719  TROPONINI <0.03   BNP (last 3 results) No results for input(s): PROBNP in the last 8760 hours. HbA1C: No results for input(s): HGBA1C in the last 72 hours. CBG: No results for input(s): GLUCAP in the last 168 hours. Lipid Profile: No results for input(s): CHOL, HDL, LDLCALC, TRIG, CHOLHDL, LDLDIRECT in the last 72 hours. Thyroid Function Tests:  Recent Labs  04/15/16 1719  TSH 0.304*   Anemia Panel: No results for input(s): VITAMINB12, FOLATE, FERRITIN, TIBC, IRON, RETICCTPCT in the last 72 hours. Urine analysis:    Component Value Date/Time   COLORURINE YELLOW 04/15/2016 1727   APPEARANCEUR CLOUDY (A) 04/15/2016 1727   LABSPEC 1.017 04/15/2016 1727   PHURINE 6.0 04/15/2016 1727   GLUCOSEU NEGATIVE 04/15/2016 1727   HGBUR MODERATE (A) 04/15/2016 1727   BILIRUBINUR NEGATIVE 04/15/2016 1727   KETONESUR NEGATIVE 04/15/2016 1727   PROTEINUR NEGATIVE 04/15/2016 1727   UROBILINOGEN 0.2 05/09/2010 1015   NITRITE POSITIVE (A) 04/15/2016 1727   LEUKOCYTESUR SMALL (A) 04/15/2016 1727   Sepsis Labs: @LABRCNTIP (procalcitonin:4,lacticidven:4) )No results found for this or any previous visit (from the past 240 hour(s)).   Radiological Exams on Admission: No results found.  EKG: Independently reviewed. Sinus bradycardia-with first-degree AV block beats around 51 bpm.  Assessment/Plan Principal Problem:   Angioedema Active Problems:   Diabetes mellitus, type II (HCC)   Hyperlipidemia   Hypothyroidism   Dizziness    1. Angioedema involving the uvula and soft palate- most likely from lisinopril. Patient advised not to use lisinopril or ACE inhibitors. Since there is not much improvement with  Solu-Medrol Pepcid and Benadryl I have requested pharmacy to dose [place]. We'll continue steroids and Pepcid. Closely observe in stepdown unit. Pulmonary  critical care was notified by ER physician. Patient will be kept nothing by mouth. 2. Dizziness/vertigo - check CT head. Patient eventually will need MRI brain. 3. Hypertension - discontinue lisinopril. Patient is on when necessary IV hydralazine for now. 4. Diabetes mellitus type 2 - since patient is nothing by mouth I have placed patient on sliding scale coverage. Note that patient is on Solu-Medrol. 5. Hyperlipidemia - continue statins once patient can take orally. 6. Chronic anemia - follow CBC. 7. Hypothyroidism - on IV Synthroid.   DVT prophylaxis: SCDs. Code Status: Full code.  Family Communication: Discussed with patient.  Disposition Plan: Home.  Consults called: None. ER physician had discussed with pulmonary critical care.  Admission status: Observation.    Rise Patience MD [facility] Pager [phone].  If 7PM-7AM, please contact night-coverage [email or web address] Password [facility]  04/15/2016, 9:13 PM

## 2016-04-15 NOTE — ED Notes (Signed)
Nurse unable to get labs

## 2016-04-15 NOTE — ED Provider Notes (Signed)
[facility] DEPT Provider Note   CSN: [id number] Arrival date & time: 04/15/16  1046     History   Chief Complaint Chief Complaint  Patient presents with  . Dizziness    HPI Autumn Carson is a 60 y.o. female.  Patient presents with positional dizziness and vertigo since September 4. She states this is worse when she turns her head and leans forward but is okay when she is standing still. She feels room spinning when she moves. Has difficulty walking but no falls. She has seen her doctor 2 and been treated with meclizine and Valium with partial relief. She returns today because the dizziness is persistent and has not gone away at all today. She took Valium without relief. She is having difficulty walking. The dizziness is described as room spinning. She has 2 episodes of nausea and vomiting today. She has an intermittent occipital headache that comes and goes. No focal weakness, numbness or tingling. No vision changes. No fever. No chest pain or shortness of breath. She has had vertigo in the past but has never lasted this long.   The history is provided by the patient and a relative.  Dizziness  Associated symptoms: headaches, nausea and vomiting   Associated symptoms: no chest pain, no shortness of breath and no weakness     Past Medical History:  Diagnosis Date  . Anxiety   . Arthritis of both knees   . Back pain   . Cancer ([place])    Thyroid  . Chronic anemia   . Essential hypertension   . GERD (gastroesophageal reflux disease)   . History of thyroid cancer 2011   Papillary thyroid cancer s/p radiation  . Hypothyroidism   . Lumbar spinal stenosis   . Obesity   . Seasonal allergies   . Type 2 diabetes mellitus with diabetic neuropathy [facility])     Patient Active Problem List   Diagnosis Date Noted  . Dysphagia   . Dysphagia, idiopathic 10/27/2015  . Atypical chest pain 07/19/2015  . OA (osteoarthritis) of knee 06/23/2014  . Knee pain, right 09/16/2013    . Spinal stenosis, lumbar region, with neurogenic claudication 04/07/2013  . Positive for microalbuminuria 02/10/2013  . Hypothyroidism 02/10/2013  . Morbid obesity ([place]) 02/10/2013  . DDD (degenerative disc disease), lumbar 12/16/2012  . FH: colon cancer 11/26/2012  . Chronic anemia 11/26/2012  . Unspecified constipation 11/26/2012  . Headache(784.0) 09/15/2012  . Head injury, unspecified 09/15/2012  . Depression with anxiety 07/29/2012  . Diabetic neuropathy ([place]) 06/12/2012  . Leg swelling 06/12/2012  . GERD 04/12/2009  . Diabetes mellitus, type II ([place]) 01/13/2009  . Hyperlipidemia 01/13/2009  . Essential hypertension 01/13/2009  . OSTEOARTHRITIS 01/13/2009    Past Surgical History:  Procedure Laterality Date  . BIOPSY N/A 01/16/2013   Procedure: BIOPSY;  Surgeon: Danie Binder, MD;  [facility];  Service: Endoscopy;  Laterality: N/A;  SMALL BOWEL ULCERS  . BRAVO [place] STUDY N/A 11/22/2015   Procedure: BRAVO [place];  Surgeon: Danie Binder, MD;  [facility];  Service: Endoscopy;  Laterality: N/A;  . CARPAL TUNNEL RELEASE     4 total, bilaterally  . CESAREAN SECTION    . CHOLECYSTECTOMY  1990's  . COLONOSCOPY WITH ESOPHAGOGASTRODUODENOSCOPY (EGD) N/A 12/08/2012   SLF: 1. Normal mucosa in the terminal ileum 2. mild diverticulosis throughout the entire examined colon. 3. rectal bleeding due to moderate sized internal hemorrhoids.   . ELBOW SURGERY Bilateral   .  ENTEROSCOPY N/A 01/16/2013   Procedure: ENTEROSCOPY;  Surgeon: Danie Binder, MD;  [facility];  Service: Endoscopy;  Laterality: N/A;  2:00  . ESOPHAGOGASTRODUODENOSCOPY N/A 11/22/2015   Procedure: ESOPHAGOGASTRODUODENOSCOPY (EGD);  Surgeon: Danie Binder, MD;  [facility];  Service: Endoscopy;  Laterality: N/A;  130 - moved to 2:00 - office to notify pt  . GIVENS CAPSULE STUDY N/A 12/31/2012   Procedure: GIVENS CAPSULE STUDY;  Surgeon: Danie Binder, MD;  [facility];  Service: Endoscopy;  Laterality: N/A;  7:30  . ROTATOR CUFF REPAIR Bilateral    bilaterally  . SAVORY DILATION N/A 11/22/2015   Procedure: SAVORY DILATION;  Surgeon: Danie Binder, MD;  [facility];  Service: Endoscopy;  Laterality: N/A;  . SPINE SURGERY     Lumbar Fusion- Dr. Trenton Gammon  . TONSILECTOMY, ADENOIDECTOMY, BILATERAL MYRINGOTOMY AND TUBES  1987  . TOTAL THYROIDECTOMY  2011  . TUBAL LIGATION      OB History    No data available       Home Medications    Prior to Admission medications   Medication Sig Start Date End Date Taking? Authorizing Provider  albuterol (PROVENTIL HFA;VENTOLIN HFA) 108 (90 BASE) MCG/ACT inhaler Inhale 2 puffs into the lungs every 6 (six) hours as needed for wheezing. 08/20/13   Alycia Rossetti, MD  aspirin 81 MG tablet Take 81 mg by mouth every other day.    Historical Provider, MD  cyclobenzaprine (FLEXERIL) 10 MG tablet TAKE 1 TABLET BY MOUTH EVERY 8 HOURS AS NEEDED FOR MUSCLE SPASMS 07/19/15   Alycia Rossetti, MD  diazepam (VALIUM) 5 MG tablet Take 1 tablet (5 mg total) by mouth every 12 (twelve) hours as needed for anxiety. 04/11/16   Alycia Rossetti, MD  diclofenac sodium (VOLTAREN) 1 % GEL Apply 2 g topically 4 (four) times daily. 04/15/15   Alycia Rossetti, MD  FLUoxetine (PROZAC) 40 MG capsule TAKE 1 CAPSULE EVERY DAY BEFORE BREAKFAST (REQUIRE OFFICE VISIT BEFORE ANY FURTHER REFILLS CAN BE GIVEN) 01/05/16   Susy Frizzle, MD  fluticasone (FLONASE) 50 MCG/ACT nasal spray PLACE 2 SPRAYS INTO BOTH NOSTRILS DAILY.    Alycia Rossetti, MD  furosemide (LASIX) 20 MG tablet TAKE 1 TABLET EVERY DAY BEFORE BREAKFAST 02/01/16   Alycia Rossetti, MD  gabapentin (NEURONTIN) 300 MG capsule TAKE 1 CAPSULE TWICE DAILY 02/01/16   Alycia Rossetti, MD  lisinopril (PRINIVIL,ZESTRIL) 40 MG tablet TAKE 1 TABLET EVERY DAY 04/09/16   Alycia Rossetti, MD  meclizine (ANTIVERT) 25 MG tablet Take 1 tablet (25 mg total) by mouth 3 (three) times daily as  needed for dizziness. 01/12/15   Alycia Rossetti, MD  metFORMIN (GLUCOPHAGE) 1000 MG tablet TAKE 1 TABLET TWICE DAILY EVERY MORNING AND EVENING 12/02/15   Alycia Rossetti, MD  metoprolol succinate (TOPROL-XL) 25 MG 24 hr tablet TAKE 1 TABLET EVERY DAY 10/10/15   Alycia Rossetti, MD  naproxen (NAPROSYN) 500 MG tablet TAKE 1 TABLET TWICE DAILY WITH MEALS 02/01/16   Alycia Rossetti, MD  omeprazole (PRILOSEC) 40 MG capsule 1 PO 30 MINS PRIOR TO MEALS BID 12/11/15   Danie Binder, MD  promethazine (PHENERGAN) 12.5 MG tablet Take 1 tablet (12.5 mg total) by mouth every 8 (eight) hours as needed for nausea or vomiting. 04/06/16   Alycia Rossetti, MD  simvastatin (ZOCOR) 40 MG tablet Take 1 tablet (40 mg total) by mouth daily.  07/19/15   Alycia Rossetti, MD  SYNTHROID 125 MCG tablet Take 250 mcg by mouth daily before breakfast.  01/14/15   Historical Provider, MD  zolpidem (AMBIEN) 5 MG tablet TAKE 1 TABLET BY MOUTH EVERY NIGHT AT BEDTIME AS NEEDED FOR SLEEP 03/21/16   Alycia Rossetti, MD    Family History Family History  Problem Relation Age of Onset  . Heart disease Mother   . Lung cancer Father     Age 66  . Colon cancer Brother     Age 67  . Heart attack Daughter 19  . Hypertension Daughter   . Other Brother     Agent orange, age 50, cancer    Social History Social History  Substance Use Topics  . Smoking status: Never Smoker  . Smokeless tobacco: Never Used  . Alcohol use No     Comment: Occassional     Allergies   Grapefruit oil   Review of Systems Review of Systems  Constitutional: Negative for activity change, appetite change and fever.  HENT: Negative for congestion and rhinorrhea.   Respiratory: Negative for cough, chest tightness and shortness of breath.   Cardiovascular: Negative for chest pain and leg swelling.  Gastrointestinal: Positive for nausea and vomiting. Negative for abdominal pain.  Genitourinary: Negative for dysuria, hematuria, vaginal bleeding and vaginal  discharge.  Musculoskeletal: Negative for arthralgias, myalgias, neck pain and neck stiffness.  Skin: Negative for rash.  Neurological: Positive for dizziness, light-headedness and headaches. Negative for weakness.  A complete 10 system review of systems was obtained and all systems are negative except as noted in the HPI and PMH.     Physical Exam Updated Vital Signs BP 163/83 (BP Location: Right Arm)   Pulse 65   Temp 98 F (36.7 C) (Oral)   Resp 19   LMP 09/07/2011   SpO2 100%   Physical Exam  Constitutional: She is oriented to person, place, and time. She appears well-developed and well-nourished. No distress.  HENT:  Head: Normocephalic and atraumatic.  Mouth/Throat: Oropharynx is clear and moist. No oropharyngeal exudate.  Eyes: Conjunctivae and EOM are normal. Pupils are equal, round, and reactive to light.  Neck: Normal range of motion. Neck supple.  No meningismus.  Cardiovascular: Normal rate, regular rhythm, normal heart sounds and intact distal pulses.   No murmur heard. Pulmonary/Chest: Effort normal and breath sounds normal. No respiratory distress.  Abdominal: Soft. There is no tenderness. There is no rebound and no guarding.  Musculoskeletal: Normal range of motion. She exhibits no edema or tenderness.  Neurological: She is alert and oriented to person, place, and time. No cranial nerve deficit. She exhibits normal muscle tone. Coordination normal.  No ataxia on finger to nose bilaterally. No pronator drift. 5/5 strength throughout. CN 2-12 intact.Equal grip strength. Sensation intact.  Negative romberg. Normal gait Test of skew negative. Head impulse testing negative. No appreciable nystagmus  Skin: Skin is warm.  Psychiatric: She has a normal mood and affect. Her behavior is normal.  Nursing note and vitals reviewed.    ED Treatments / Results  Labs (all labs ordered are listed, but only abnormal results are displayed) Labs Reviewed  BASIC METABOLIC  PANEL - Abnormal; Notable for the following:       Result Value   Glucose, Bld 112 (*)    All other components within normal limits  CBC - Abnormal; Notable for the following:    Hemoglobin 10.3 (*)    HCT 34.8 (*)  MCH 23.4 (*)    MCHC 29.6 (*)    RDW 16.9 (*)    All other components within normal limits  URINALYSIS, ROUTINE W REFLEX MICROSCOPIC (NOT AT [facility])  TROPONIN I  HEPATIC FUNCTION PANEL  TSH    EKG  EKG Interpretation  Date/Time:  Sunday April 15 2016 11:03:02 EDT Ventricular Rate:  51 PR Interval:  218 QRS Duration: 96 QT Interval:  456 QTC Calculation: 420 R Axis:   17 Text Interpretation:  Sinus bradycardia with 1st degree A-V block Low voltage QRS Cannot rule out Anterior infarct , age undetermined Abnormal ECG No significant change was found Confirmed by Wyvonnia Dusky  MD, Keane Martelli [id number]) on 04/15/2016 4:53:31 PM       Radiology No results found.  Procedures Procedures (including critical care time)  Medications Ordered in ED Medications  meclizine (ANTIVERT) tablet 25 mg (not administered)  diazepam (VALIUM) tablet 5 mg (not administered)     Initial Impression / Assessment and Plan / ED Course  I have reviewed the triage vital signs and the nursing notes.  Pertinent labs & imaging results that were available during my care of the patient were reviewed by me and considered in my medical decision making (see chart for details).  Clinical Course  positional vertigo for the past 13 days.  Nausea and vomiting today.  Worse with head movement and position change.  Meclizine and valium given. Plan to obtain MRI given persistent nature of dizziness. nonfocal neuro exam.  7:30 PM. Patient reported to the nurse that it feels like her throat was closing and swelling in the back of her throat. Only medications she have received are meclizine and Valium.  Patient is in no distress. There is some mild swelling of her uvula and posterior soft palate. There is  no swelling of her tongue and lips. She is speaking in full sentences. There is no stridor or drooling. Patient does take lisinopril. Question whether this could be angioedema from that.  She will be given steroids, Benadryl and antihistamines Will cancel MRI at this time.  Recheck.  Swelling of uvula stable, not worse.  Patient told to d/c lisinopril, added to allergy list.  Plan observation admission for airway swelling. No need for intubation at this time.  D/w Dr. Hal Hope.  MRI cancelled.  D/w Dr. Tamala Julian of PCCM who agrees with stepdown admission and is aware of patient.   CRITICAL CARE Performed by: Ezequiel Essex Total critical care time: 35 minutes Critical care time was exclusive of separately billable procedures and treating other patients. Critical care was necessary to treat or prevent imminent or life-threatening deterioration. Critical care was time spent personally by me on the following activities: development of treatment plan with patient and/or surrogate as well as nursing, discussions with consultants, evaluation of patient's response to treatment, examination of patient, obtaining history from patient or surrogate, ordering and performing treatments and interventions, ordering and review of laboratory studies, ordering and review of radiographic studies, pulse oximetry and re-evaluation of patient's condition.     Final Clinical Impressions(s) / ED Diagnoses   Final diagnoses:  TIA (transient ischemic attack)  Dizziness    New Prescriptions New Prescriptions   No medications on file     Ezequiel Essex, MD 04/16/16 [phone]

## 2016-04-16 ENCOUNTER — Observation Stay (HOSPITAL_COMMUNITY): Payer: Commercial Managed Care - HMO

## 2016-04-16 ENCOUNTER — Other Ambulatory Visit: Payer: Self-pay

## 2016-04-16 ENCOUNTER — Telehealth: Payer: Self-pay | Admitting: Family Medicine

## 2016-04-16 DIAGNOSIS — E1143 Type 2 diabetes mellitus with diabetic autonomic (poly)neuropathy: Secondary | ICD-10-CM

## 2016-04-16 DIAGNOSIS — Z923 Personal history of irradiation: Secondary | ICD-10-CM | POA: Diagnosis not present

## 2016-04-16 DIAGNOSIS — R42 Dizziness and giddiness: Secondary | ICD-10-CM | POA: Diagnosis not present

## 2016-04-16 DIAGNOSIS — T783XXD Angioneurotic edema, subsequent encounter: Secondary | ICD-10-CM | POA: Diagnosis not present

## 2016-04-16 DIAGNOSIS — F419 Anxiety disorder, unspecified: Secondary | ICD-10-CM | POA: Diagnosis not present

## 2016-04-16 DIAGNOSIS — E785 Hyperlipidemia, unspecified: Secondary | ICD-10-CM | POA: Diagnosis not present

## 2016-04-16 DIAGNOSIS — E039 Hypothyroidism, unspecified: Secondary | ICD-10-CM | POA: Diagnosis not present

## 2016-04-16 DIAGNOSIS — R001 Bradycardia, unspecified: Secondary | ICD-10-CM

## 2016-04-16 DIAGNOSIS — D649 Anemia, unspecified: Secondary | ICD-10-CM | POA: Diagnosis not present

## 2016-04-16 DIAGNOSIS — T783XXA Angioneurotic edema, initial encounter: Secondary | ICD-10-CM | POA: Diagnosis not present

## 2016-04-16 DIAGNOSIS — I10 Essential (primary) hypertension: Secondary | ICD-10-CM | POA: Diagnosis not present

## 2016-04-16 DIAGNOSIS — Z85850 Personal history of malignant neoplasm of thyroid: Secondary | ICD-10-CM | POA: Diagnosis not present

## 2016-04-16 LAB — CBC
HCT: 35.7 % — ABNORMAL LOW (ref 36.0–46.0)
Hemoglobin: 10.6 g/dL — ABNORMAL LOW (ref 12.0–15.0)
MCH: 23.3 pg — AB (ref 26.0–34.0)
MCHC: 29.7 g/dL — AB (ref 30.0–36.0)
MCV: 78.5 fL (ref 78.0–100.0)
Platelets: 262 10*3/uL (ref 150–400)
RBC: 4.55 MIL/uL (ref 3.87–5.11)
RDW: 16.9 % — AB (ref 11.5–15.5)
WBC: 6.2 10*3/uL (ref 4.0–10.5)

## 2016-04-16 LAB — COMPREHENSIVE METABOLIC PANEL
ALT: 12 U/L — AB (ref 14–54)
AST: 14 U/L — ABNORMAL LOW (ref 15–41)
Albumin: 3.9 g/dL (ref 3.5–5.0)
Alkaline Phosphatase: 66 U/L (ref 38–126)
Anion gap: 11 (ref 5–15)
BUN: 11 mg/dL (ref 6–20)
CALCIUM: 9.1 mg/dL (ref 8.9–10.3)
CO2: 25 mmol/L (ref 22–32)
CREATININE: 0.96 mg/dL (ref 0.44–1.00)
Chloride: 105 mmol/L (ref 101–111)
GFR calc non Af Amer: >60 mL/min (ref >60)
GLUCOSE: 183 mg/dL — AB (ref 65–99)
Potassium: 3.9 mmol/L (ref 3.5–5.1)
Sodium: 141 mmol/L (ref 135–145)
Total Bilirubin: 0.3 mg/dL (ref 0.3–1.2)
Total Protein: 6.9 g/dL (ref 6.5–8.1)

## 2016-04-16 LAB — MRSA PCR SCREENING: MRSA BY PCR: NEGATIVE

## 2016-04-16 LAB — GLUCOSE, CAPILLARY
GLUCOSE-CAPILLARY: 152 mg/dL — AB (ref 65–99)
GLUCOSE-CAPILLARY: 150 mg/dL — AB (ref 65–99)
GLUCOSE-CAPILLARY: 217 mg/dL — AB (ref 65–99)
Glucose-Capillary: 122 mg/dL — ABNORMAL HIGH (ref 65–99)

## 2016-04-16 LAB — TROPONIN I: Troponin I: <0.03 ng/mL (ref <0.03)

## 2016-04-16 MED ORDER — DIAZEPAM 5 MG PO TABS
5.0000 mg | ORAL_TABLET | Freq: Two times a day (BID) | ORAL | Status: DC | PRN
  Administered 2016-04-17: 5 mg via ORAL
  Filled 2016-04-16: qty 1

## 2016-04-16 MED ORDER — LEVOTHYROXINE SODIUM 50 MCG PO TABS
250.0000 ug | ORAL_TABLET | Freq: Every day | ORAL | Status: DC
  Administered 2016-04-17: 250 ug via ORAL
  Filled 2016-04-16: qty 2

## 2016-04-16 MED ORDER — GADOBENATE DIMEGLUMINE 529 MG/ML IV SOLN
20.0000 mL | Freq: Once | INTRAVENOUS | Status: AC
  Administered 2016-04-17: 20 mL via INTRAVENOUS

## 2016-04-16 MED ORDER — CYCLOBENZAPRINE HCL 10 MG PO TABS: 10.0000 mg | ORAL_TABLET | Freq: Three times a day (TID) | ORAL | Status: DC | PRN

## 2016-04-16 MED ORDER — SIMVASTATIN 40 MG PO TABS
40.0000 mg | ORAL_TABLET | Freq: Every day | ORAL | Status: DC
  Administered 2016-04-16: 40 mg via ORAL
  Filled 2016-04-16: qty 1

## 2016-04-16 MED ORDER — INSULIN ASPART 100 UNIT/ML ~~LOC~~ SOLN: 0.0000 [IU] | Freq: Three times a day (TID) | SUBCUTANEOUS | Status: DC

## 2016-04-16 MED ORDER — INSULIN ASPART 100 UNIT/ML ~~LOC~~ SOLN
0.0000 [IU] | SUBCUTANEOUS | Status: DC
  Administered 2016-04-16: 1 [IU] via SUBCUTANEOUS
  Administered 2016-04-16: 2 [IU] via SUBCUTANEOUS

## 2016-04-16 MED ORDER — ENOXAPARIN SODIUM 80 MG/0.8ML ~~LOC~~ SOLN
65.0000 mg | SUBCUTANEOUS | Status: DC
Start: 2016-04-16 — End: 2016-04-17
  Administered 2016-04-16: 65 mg via SUBCUTANEOUS
  Filled 2016-04-16: qty 0.65

## 2016-04-16 MED ORDER — ALBUTEROL SULFATE (2.5 MG/3ML) 0.083% IN NEBU: 2.5000 mg | INHALATION_SOLUTION | Freq: Four times a day (QID) | RESPIRATORY_TRACT | Status: DC | PRN

## 2016-04-16 MED ORDER — GABAPENTIN 300 MG PO CAPS
300.0000 mg | ORAL_CAPSULE | Freq: Two times a day (BID) | ORAL | Status: DC
  Administered 2016-04-16 – 2016-04-17 (×2): 300 mg via ORAL
  Filled 2016-04-16 (×2): qty 1

## 2016-04-16 MED ORDER — INSULIN ASPART 100 UNIT/ML ~~LOC~~ SOLN: 0.0000 [IU] | Freq: Every day | SUBCUTANEOUS | Status: DC

## 2016-04-16 MED ORDER — ENOXAPARIN SODIUM 80 MG/0.8ML ~~LOC~~ SOLN
65.0000 mg | SUBCUTANEOUS | Status: DC
  Filled 2016-04-16: qty 0.65

## 2016-04-16 MED ORDER — ALBUTEROL SULFATE HFA 108 (90 BASE) MCG/ACT IN AERS: 2.0000 | INHALATION_SPRAY | Freq: Four times a day (QID) | RESPIRATORY_TRACT | Status: DC | PRN

## 2016-04-16 MED ORDER — INSULIN ASPART 100 UNIT/ML ~~LOC~~ SOLN
0.0000 [IU] | Freq: Three times a day (TID) | SUBCUTANEOUS | Status: DC
  Administered 2016-04-16 – 2016-04-17 (×3): 2 [IU] via SUBCUTANEOUS

## 2016-04-16 MED ORDER — INFLUENZA VAC SPLIT QUAD 0.5 ML IM SUSY
0.5000 mL | PREFILLED_SYRINGE | INTRAMUSCULAR | Status: AC
  Administered 2016-04-17: 0.5 mL via INTRAMUSCULAR
  Filled 2016-04-16: qty 0.5

## 2016-04-16 MED ORDER — ENSURE ENLIVE PO LIQD
237.0000 mL | Freq: Two times a day (BID) | ORAL | Status: DC
  Administered 2016-04-17: 237 mL via ORAL

## 2016-04-16 NOTE — Telephone Encounter (Signed)
Patient calling to let you know she is in the hospital for the same thing she was seen for last time she was here, would like to speak to you regarding this

## 2016-04-16 NOTE — Progress Notes (Signed)
Initial Nutrition Assessment  DOCUMENTATION CODES:   Morbid obesity, Severe malnutrition in context of acute illness/injury  INTERVENTION:  Ensure Enlive po BID, each supplement provides 350 kcal and 20 grams of protein  Encouraged pt to eat adequate protein in setting of recent weight loss and also in setting of low protein intake at home.   NUTRITION DIAGNOSIS:   Malnutrition (Severe) related to acute illness as evidenced by energy intake < or equal to 50% for > or equal to 5 days, 5 percent weight loss in 1.5 weeks.  GOAL:   Patient will meet greater than or equal to 90% of their needs  MONITOR:   PO intake, Supplement acceptance, Weight trends  REASON FOR ASSESSMENT:   Malnutrition Screening Tool    ASSESSMENT:   60 y.o. female with diabetes mellitus type 2, hypertension, hypothyroidism, hyperlipidemia presents to the ER because of persistent dizziness. Patient's dizziness started on September 4 at that time it was associated with nausea vomiting and diarrhea. Patient's diarrhea lasted for 3 days. Patient's dizziness persisted and patient had gone to her PCP twice and was eventually placed on meclizine and Valium despite which patient was still feeling dizzy. Patient's dizziness is only when she tries to move. Patient admitted for angioedema.    Patient reports her UBW has been 325 lbs most recently. Her highest weight was 360 lbs, but she purposefully began losing weight by cutting back on fried foods and drinking more water. Most recent weight loss below 325 lbs has been unintentional. She reports poor appetite and early satiety since onset of dizziness, nausea, vomiting, and diarrhea in the past month. She has been eating 50% of usual intake. Patient reports she has no questions regarding CHO mod diet.   Per chart, patient has lost 6.7 kg (5% body weight) in 1.5 weeks (since 04/06/2016).   Usual intake:  B - coffee, 2 pieces toast D - vegetables with bread or pasta Snacks  - Crackers with peanut butter (7-8 crackers, 3-4 tsp Pb) 2x/day, popcorn all day, Ensure 2x/day (pt unsure if it is original or Plus)  Medications reviewed and include: famotidine, Novolog sliding scale Q4hrs, methylprednisolone, NS @ 50 ml/hr.  Labs reviewed: CBG 150-152.   Nutrition-Focused physical exam completed. Findings are no fat depletion, no muscle depletion, and no edema.   Discussed plan with RN.   Diet Order:  Diet Carb Modified Fluid consistency: Thin; Room service appropriate? Yes  Skin:  Reviewed, no issues  Last BM:  04/14/2016 (PTA)  Height:   Ht Readings from Last 1 Encounters:  04/16/16 5\' 6"  (1.676 m)    Weight:   Wt Readings from Last 1 Encounters:  04/16/16 296 lb 4.8 oz (134.4 kg)    Ideal Body Weight:  59.09 kg  BMI:  Body mass index is 47.82 kg/m.  Estimated Nutritional Needs:   Kcal:  [id number]  Protein:  135-160 grams  Fluid:  2.3-2.5 L/day  EDUCATION NEEDS:   No education needs identified at this time  Willey Blade, MS, RD, LDN Pager: [phone] After Hours Pager: [phone]

## 2016-04-16 NOTE — Progress Notes (Signed)
[place] TEAM 1 - Stepdown/ICU TEAM  Autumn Carson  [id number] DOB: [birth year] DOA: 04/15/2016 PCP: Vic Blackbird, MD    Brief Narrative:  60 y.o. female with diabetes mellitus 2, hypertension, hypothyroidism, and hyperlipidemia who presented w/ complaints of persistent dizziness which started on September 4 and was associated with nausea vomiting and diarrhea. Diarrhea lasted for 3 days but the dizziness persisted and she was eventually placed on meclizine and Valium which did not help.  In the ER the patient developed throat tightness with swelling of the soft palate and uvula. Patient was given Solu-Medrol Benadryl and Pepcid despite which swelling persisted. Patient was able to talk and denied any difficulty breathing. The only medications patient received in the ER were meclizine and Valium. Patient had been on lisinopril for more than 10 years.   Subjective: The pt is feeling much better.  She denies any swelling of the face, throat, or tongue.  No sob or wheezing.  She is very hungry and wishes to eat.    Assessment & Plan:  Angioedema involving the uvula and soft palate most likely from lisinopril - advised not to use lisinopril or ACE inhibitors - not much improvement noted with Solu-Medrol Pepcid and Benadryl but had rapid marked improvement with Berinert - stop steroids and follow   Dizziness/vertigo CT head unrevealing - will proceed w/ MRI brain to allow better visualization of the posterior fossa  Bradycardia Potential cause of dizziness - d/c toprol - follow on tele   Hypertension  BP reasonably well controlled at this time   DM2  CBG reasonably controlled   Hyperlipidemia Resume statin   Chronic anemia Hgb stable   Hypothyroidism Cont home synthroid dose  Obesity - Body mass index is 47.82 kg/m.  DVT prophylaxis: lovenox  Code Status: FULL CODE Family Communication: no family present at time of exam  Disposition Plan: transfer to tele bed  - MRI brain - watch bradycardia - PT/OT evals - follow angioedema   Consultants:  none  Procedures: TTE - pending   Antimicrobials:  none  Objective: Blood pressure 135/74, pulse (!) 56, temperature 98 F (36.7 C), temperature source Oral, resp. rate 17, height 5\' 6"  (1.676 m), weight 134.4 kg (296 lb 4.8 oz), last menstrual period 09/07/2011, SpO2 97 %.  Intake/Output Summary (Last 24 hours) at 04/16/16 1556 Last data filed at 04/16/16 0631  Gross per 24 hour  Intake            602.5 ml  Output              650 ml  Net            -47.5 ml   Filed Weights   04/16/16 0234  Weight: 134.4 kg (296 lb 4.8 oz)    Examination: General: No acute respiratory distress Lungs: Clear to auscultation bilaterally without wheezes or crackles - distant BS Cardiovascular: bradycardic - no M or rub - regular rythm Abdomen: Nontender, obese, soft, bowel sounds positive, no rebound, no ascites, no appreciable mass Extremities: No significant cyanosis, clubbing, or edema bilateral lower extremities  CBC:  Recent Labs Lab 04/15/16 1126 04/16/16 0306  WBC 4.4 6.2  HGB 10.3* 10.6*  HCT 34.8* 35.7*  MCV 79.1 78.5  PLT 235 [id number]   Basic Metabolic Panel:  Recent Labs Lab 04/15/16 1126 04/16/16 0306  NA 141 141  K 4.1 3.9  CL 105 105  CO2 27 25  GLUCOSE 112* 183*  BUN 13 11  CREATININE 0.95  0.96  CALCIUM 9.1 9.1   GFR: Estimated Creatinine Clearance: 90 mL/min (by C-G formula based on SCr of 0.96 mg/dL).  Liver Function Tests:  Recent Labs Lab 04/15/16 1719 04/16/16 0306  AST 13* 14*  ALT 11* 12*  ALKPHOS 64 66  BILITOT 0.5 0.3  PROT 7.0 6.9  ALBUMIN 4.0 3.9   Cardiac Enzymes:  Recent Labs Lab 04/15/16 1719 04/16/16 0806  TROPONINI <0.03 <0.03    HbA1C: Hgb A1c MFr Bld  Date/Time Value Ref Range Status  04/06/2016 11:01 AM 6.4 (H) <5.7 % Final    Comment:      For someone without known diabetes, a hemoglobin A1c value between 5.7% and 6.4% is consistent  with prediabetes and should be confirmed with a follow-up test.   For someone with known diabetes, a value <7% indicates that their diabetes is well controlled. A1c targets should be individualized based on duration of diabetes, age, co-morbid conditions and other considerations.   This assay result is consistent with an increased risk of diabetes.   Currently, no consensus exists regarding use of hemoglobin A1c for diagnosis of diabetes in children.     07/19/2015 11:01 AM 7.1 (H) <5.7 % Final    Comment:                                                                           According to the ADA Clinical Practice Recommendations for 2011, when HbA1c is used as a screening test:     >=6.5%   Diagnostic of Diabetes Mellitus            (if abnormal result is confirmed)   5.7-6.4%   Increased risk of developing Diabetes Mellitus   References:Diagnosis and Classification of Diabetes Mellitus,Diabetes [id number] 1):S62-S69 and Standards of Medical Care in         Diabetes - 2011,Diabetes [id number] (Suppl 1):S11-S61.       CBG:  Recent Labs Lab 04/16/16 0852 04/16/16 1139  GLUCAP 152* 150*    Recent Results (from the past 240 hour(s))  MRSA PCR Screening     Status: None   Collection Time: 04/16/16  2:32 AM  Result Value Ref Range Status   MRSA by PCR NEGATIVE NEGATIVE Final    Comment:        The GeneXpert MRSA Assay (FDA approved for NASAL specimens only), is one component of a comprehensive MRSA colonization surveillance program. It is not intended to diagnose MRSA infection nor to guide or monitor treatment for MRSA infections.      Scheduled Meds: . famotidine (PEPCID) IV  20 mg Intravenous Q12H  . [START ON 04/17/2016] Influenza vac split quadrivalent PF  0.5 mL Intramuscular Tomorrow-1000  . insulin aspart  0-9 Units Subcutaneous Q4H  . levothyroxine  62.5 mcg Intravenous Daily  . methylPREDNISolone (SOLU-MEDROL) injection  40 mg  Intravenous Q12H   Continuous Infusions: . sodium chloride 75 mL/hr at 04/15/16 2238  . sodium chloride Stopped (04/16/16 0200)     LOS: 0 days   Cherene Altes, MD [facility] Office  [phone] Pager - Text Page per Amion as per below:  On-Call/Text Page:      [email or web address]      password [facility]  If 7PM-7AM, please contact night-coverage [email or web address] Password [facility] 04/16/2016, 3:56 PM

## 2016-04-17 ENCOUNTER — Other Ambulatory Visit: Payer: Self-pay | Admitting: Family Medicine

## 2016-04-17 ENCOUNTER — Observation Stay (HOSPITAL_BASED_OUTPATIENT_CLINIC_OR_DEPARTMENT_OTHER): Payer: Commercial Managed Care - HMO

## 2016-04-17 DIAGNOSIS — E118 Type 2 diabetes mellitus with unspecified complications: Secondary | ICD-10-CM

## 2016-04-17 DIAGNOSIS — R9431 Abnormal electrocardiogram [ECG] [EKG]: Secondary | ICD-10-CM

## 2016-04-17 DIAGNOSIS — E038 Other specified hypothyroidism: Secondary | ICD-10-CM | POA: Diagnosis not present

## 2016-04-17 DIAGNOSIS — R42 Dizziness and giddiness: Secondary | ICD-10-CM | POA: Diagnosis not present

## 2016-04-17 DIAGNOSIS — T783XXA Angioneurotic edema, initial encounter: Secondary | ICD-10-CM | POA: Diagnosis not present

## 2016-04-17 DIAGNOSIS — I10 Essential (primary) hypertension: Secondary | ICD-10-CM

## 2016-04-17 DIAGNOSIS — R001 Bradycardia, unspecified: Secondary | ICD-10-CM

## 2016-04-17 LAB — ECHOCARDIOGRAM COMPLETE
HEIGHTINCHES: 66 in
WEIGHTICAEL: 4740.77 [oz_av]

## 2016-04-17 LAB — GLUCOSE, CAPILLARY
GLUCOSE-CAPILLARY: 141 mg/dL — AB (ref 65–99)
GLUCOSE-CAPILLARY: 136 mg/dL — AB (ref 65–99)

## 2016-04-17 MED ORDER — HYDRALAZINE HCL 10 MG PO TABS: 5.0000 mg | ORAL_TABLET | Freq: Three times a day (TID) | ORAL | 0 refills | Status: DC

## 2016-04-17 MED ORDER — HYDRALAZINE HCL 10 MG PO TABS
5.0000 mg | ORAL_TABLET | Freq: Three times a day (TID) | ORAL | Status: DC
  Administered 2016-04-17: 5 mg via ORAL
  Filled 2016-04-17: qty 1

## 2016-04-17 NOTE — Progress Notes (Signed)
  Echocardiogram 2D Echocardiogram has been performed.  Autumn Carson 04/17/2016, 3:09 PM

## 2016-04-17 NOTE — Care Management Obs Status (Signed)
[place] NOTIFICATION   Patient Details  Name: Autumn Carson MRN: [id number] Date of Birth: 1955-10-08   Medicare Observation Status Notification Given:  Yes    Meleny Tregoning T, RN 04/17/2016, 11:00 AM

## 2016-04-17 NOTE — Progress Notes (Signed)
OT Cancellation Note  Patient Details Name: Autumn Carson MRN: [id number] DOB: [birth year]   Cancelled Treatment:    Reason Eval/Treat Not Completed: OT screened, no needs identified, will sign off. Alder, OTR/L [id number]   Lucille Passy M 04/17/2016, 10:49 AM

## 2016-04-17 NOTE — Discharge Summary (Signed)
Physician Discharge Summary  NEVIA MALLOZZI [id number] DOB: 09-30-1955 DOA: 04/15/2016  PCP: Vic Blackbird, MD  Admit date: 04/15/2016 Discharge date: 04/22/2016  Time spent: 40 minutes  Recommendations for Outpatient Follow-up:   Angioedemainvolving the uvula and soft palate -most likely from lisinopril - advised not to use lisinopril or ACE inhibitors,not much improvement noted with Solu-Medrol Pepcid and Benadryl neck line -Resolved with Berinert  -Scheduled follow-up in 2-3 weeks with Dr. Vic Blackbird, angioedema   Dizziness/vertigo CT head unrevealing - will proceed w/ MRI brain to allow better visualization of the posterior fossa  Bradycardia -Hold all AV nodal blocking agents  -Scheduled follow-up with Dr[facility] cardiology in 1-2 weeks for chronic bradycardia. Pacer requirement future?. Appointment: April 26, 2016 @ 1:10pm   Hypertension -BP reasonably well controlled at this time  -Will discharge on hydralazine 5 mg TID  DM Type 2 controlled with complications -9/8 Hemoglobin A1c = 6.4    Hyperlipidemia -Zocor 40 mg daily    Chronic anemia -Hgb stable   Hypothyroidism -Cont home synthroid dose  Obesity - Body mass index is 47.82 kg/m.   Discharge Diagnoses:  Principal Problem:   Angioedema Active Problems:   Diabetes mellitus, type II ([place])   Hyperlipidemia   Hypothyroidism   Dizziness   Controlled diabetes mellitus type 2 with complications (HCC)   Bradycardia   Discharge Condition: Stable  Diet recommendation: Heart healthy, American diabetic Association  Filed Weights   04/16/16 0234  Weight: 134.4 kg (296 lb 4.8 oz)    History of present illness:  60 y.o. female PMHx diabetes mellitus type 2, hypertension, hypothyroidism, hyperlipidemia presents to the ER because of persistent dizziness. Patient's dizziness started on September 4 at that time it was associated with nausea vomiting and diarrhea. Patient's  diarrhea lasted for 3 days. Patient's dizziness persisted and patient had gone to her PCP twice and was eventually placed on meclizine and Valium despite which patient was still feeling dizzy. Patient's dizziness is only when she tries to move. In the ER patient start developing throat tightness and 1 exam was having swelling of the soft palate and uvula. Patient was given Solu-Medrol Benadryl and Pepcid despite which patient was still having swelling. Patient was able to talk and denies any difficulty breathing. Patient is being admitted for angioedema. The only medications patient received in the ER was meclizine and Valium. Patient is on lisinopril for more than 10 years now. Denies any other new medications. Denies any itching or skin rash. During his hospital physician patient was treated for angioedema, and bradycardia. Patient's BP medication was changed (lisinopril discontinue started on hydralazine). Patient angioedema resolved stable for discharge   Procedures: 9/17 CT head: Unremarkable.      Discharge Exam: Vitals:   04/17/16 0300 04/17/16 0400 04/17/16 0807 04/17/16 1149  BP: (!) 122/56 133/88 (!) 146/69 (!) 161/79  Pulse: (!) 55 (!) 50 (!) 50   Resp: 16 15 15    Temp:  98 F (36.7 C) 97.9 F (36.6 C) 98.2 F (36.8 C)  TempSrc:  Oral Oral Oral  SpO2: 95% 97% 100%   Weight:      Height:        General: A/O 4, NAD, Cardiovascular: Regular rhythm and rate, negative murmurs rubs gallops, normal S1/S2 Respiratory: Clear to auscultation bilateral  Discharge Instructions     Medication List    STOP taking these medications   furosemide 20 MG tablet Commonly known as:  LASIX   lisinopril 40 MG tablet Commonly  known as:  PRINIVIL,ZESTRIL   metoprolol succinate 25 MG 24 hr tablet Commonly known as:  TOPROL-XL     TAKE these medications   albuterol 108 (90 Base) MCG/ACT inhaler Commonly known as:  PROVENTIL HFA;VENTOLIN HFA Inhale 2 puffs into the lungs every 6  (six) hours as needed for wheezing.   aspirin 81 MG tablet Take 81 mg by mouth every other day.   cyclobenzaprine 10 MG tablet Commonly known as:  FLEXERIL TAKE 1 TABLET BY MOUTH EVERY 8 HOURS AS NEEDED FOR MUSCLE SPASMS What changed:  how much to take  how to take this  when to take this  reasons to take this  additional instructions   diazepam 5 MG tablet Commonly known as:  VALIUM Take 1 tablet (5 mg total) by mouth every 12 (twelve) hours as needed for anxiety.   FLUoxetine 40 MG capsule Commonly known as:  PROZAC TAKE 1 CAPSULE EVERY DAY BEFORE BREAKFAST (REQUIRE OFFICE VISIT BEFORE ANY FURTHER REFILLS CAN BE GIVEN) What changed:  See the new instructions.   fluticasone 50 MCG/ACT nasal spray Commonly known as:  FLONASE PLACE 2 SPRAYS INTO BOTH NOSTRILS DAILY. What changed:  See the new instructions.   gabapentin 300 MG capsule Commonly known as:  NEURONTIN TAKE 1 CAPSULE TWICE DAILY What changed:  See the new instructions.   hydrALAZINE 10 MG tablet Commonly known as:  APRESOLINE Take 0.5 tablets (5 mg total) by mouth every 8 (eight) hours.   naproxen 500 MG tablet Commonly known as:  NAPROSYN TAKE 1 TABLET TWICE DAILY WITH MEALS   omeprazole 40 MG capsule Commonly known as:  PRILOSEC 1 PO 30 MINS PRIOR TO MEALS BID What changed:  how much to take  how to take this  when to take this  additional instructions   promethazine 12.5 MG tablet Commonly known as:  PHENERGAN Take 1 tablet (12.5 mg total) by mouth every 8 (eight) hours as needed for nausea or vomiting.   simvastatin 40 MG tablet Commonly known as:  ZOCOR Take 1 tablet (40 mg total) by mouth daily.   SYNTHROID 125 MCG tablet Generic drug:  levothyroxine Take 250 mcg by mouth daily before breakfast.   zolpidem 5 MG tablet Commonly known as:  AMBIEN TAKE 1 TABLET BY MOUTH EVERY NIGHT AT BEDTIME AS NEEDED FOR SLEEP What changed:  See the new instructions.      Allergies   Allergen Reactions  . Grapefruit Oil Anaphylaxis    Grape fruit causes Anaphylaxis  . Lisinopril Anaphylaxis    angioedema   Follow-up Information    Rozann Lesches, MD. Schedule an appointment as soon as possible for a visit in 1 week(s).   Specialty:  Cardiology Why:  Scheduled follow-up with Dr[facility] cardiology in 1-2 weeks for chronic bradycardia. Pacer requirement future?. Appointment: April 26, 2016 @ 1:10pm Contact information: [place] [address] [phone]        Vic Blackbird, MD. Schedule an appointment as soon as possible for a visit in 2 weeks.   Specialty:  Family Medicine Why:  Scheduled follow-up in 2-3 weeks with Dr. Vic Blackbird, angioedema. Appointment: May 09, 2016 @ 10:30am Contact information: [place] [address] [phone]            The results of significant diagnostics from this hospitalization (including imaging, microbiology, ancillary and laboratory) are listed below for reference.    Significant Diagnostic Studies: Ct Head Wo Contrast  Result Date:  04/16/2016 CLINICAL DATA:  Acute onset of severe dizziness.  Initial encounter. EXAM: CT HEAD WITHOUT CONTRAST TECHNIQUE: Contiguous axial images were obtained from the base of the skull through the vertex without intravenous contrast. COMPARISON:  MRI of the brain performed 03/21/2005 FINDINGS: Brain: No evidence of acute infarction, hemorrhage, hydrocephalus, extra-axial collection or mass lesion/mass effect. The posterior fossa, including the cerebellum, brainstem and fourth ventricle, is within normal limits. The third and lateral ventricles, and basal ganglia are unremarkable in appearance. The cerebral hemispheres are symmetric in appearance, with normal gray-white differentiation. No mass effect or midline shift is seen. Vascular: No hyperdense vessel or unexpected calcification. Skull: There is no evidence of fracture; visualized  osseous structures are unremarkable in appearance. Sinuses/Orbits: The visualized portions of the orbits are within normal limits. The paranasal sinuses and mastoid air cells are well-aerated. Other: No significant soft tissue abnormalities are seen. IMPRESSION: Unremarkable noncontrast CT of the head. Electronically Signed   By: Garald Balding M.D.   On: 04/16/2016 00:43   Mr Jeri Cos [id number] Contrast  Result Date: 04/17/2016 CLINICAL DATA:  Evaluation for persistent dizziness. EXAM: MRI HEAD WITHOUT AND WITH CONTRAST TECHNIQUE: Multiplanar, multiecho pulse sequences of the brain and surrounding structures were obtained without and with intravenous contrast. CONTRAST:  8mL MULTIHANCE GADOBENATE DIMEGLUMINE 529 MG/ML IV SOLN COMPARISON:  Prior CT from 04/15/2016. FINDINGS: Brain: Cerebral volume within normal limits for age. No focal parenchymal signal abnormality identified. No abnormal foci of restricted diffusion to suggest acute or subacute ischemia. Gray-white matter differentiation well maintained. No evidence for acute or chronic intracranial hemorrhage. No areas of chronic infarction. No mass lesion, midline shift or mass effect. No hydrocephalus. No extra-axial fluid collection. Major dural sinuses are grossly patent. No abnormal enhancement. Pituitary gland normal. Vascular: Major intracranial vascular flow voids are well preserved. Skull and upper cervical spine: Craniocervical junction normal. Mild degenerative spondylolysis and disc bulge noted at T2-3 without stenosis. Remainder the visualized upper cervical spine unremarkable. Bone marrow signal intensity normal. No scalp soft tissue abnormality. Sinuses/Orbits: Globes and orbital soft tissues within normal limits. Paranasal sinuses are clear. No mastoid effusion. Inner ear structures grossly normal. Other: No other significant finding. IMPRESSION: Normal brain MRI. Electronically Signed   By: Jeannine Boga M.D.   On: 04/17/2016 02:21     Microbiology: Recent Results (from the past 240 hour(s))  MRSA PCR Screening     Status: None   Collection Time: 04/16/16  2:32 AM  Result Value Ref Range Status   MRSA by PCR NEGATIVE NEGATIVE Final    Comment:        The GeneXpert MRSA Assay (FDA approved for NASAL specimens only), is one component of a comprehensive MRSA colonization surveillance program. It is not intended to diagnose MRSA infection nor to guide or monitor treatment for MRSA infections.      Labs: Basic Metabolic Panel:  Recent Labs Lab 04/16/16 0306  NA 141  K 3.9  CL 105  CO2 25  GLUCOSE 183*  BUN 11  CREATININE 0.96  CALCIUM 9.1   Liver Function Tests:  Recent Labs Lab 04/16/16 0306  AST 14*  ALT 12*  ALKPHOS 66  BILITOT 0.3  PROT 6.9  ALBUMIN 3.9   No results for input(s): LIPASE, AMYLASE in the last 168 hours. No results for input(s): AMMONIA in the last 168 hours. CBC:  Recent Labs Lab 04/16/16 0306  WBC 6.2  HGB 10.6*  HCT 35.7*  MCV 78.5  PLT 262  Cardiac Enzymes:  Recent Labs Lab 04/16/16 0806  TROPONINI <0.03   BNP: BNP (last 3 results) No results for input(s): BNP in the last 8760 hours.  ProBNP (last 3 results) No results for input(s): PROBNP in the last 8760 hours.  CBG:  Recent Labs Lab 04/16/16 1139 04/16/16 1721 04/16/16 2140 04/17/16 0804 04/17/16 1148  GLUCAP 150* 122* 217* 141* 136*       Signed:  Dia Crawford, MD [facility] [phone] pager

## 2016-04-17 NOTE — Care Management Note (Signed)
Case Management Note  Patient Details  Name: Autumn Carson MRN: [id number] Date of Birth: May 09, 1956  Subjective/Objective:   PT recommended outpatient vestibular rehab and pt agrees - prefers [facility] as she has taken a family member there in the past.  Documentation and order faxed per request.                             Expected Discharge Plan:  Home/Self Care  Discharge planning Services  CM Consult  Status of Service:  Completed, signed off  Girard Cooter, [place] 04/17/2016, 2:24 PM

## 2016-04-17 NOTE — Evaluation (Signed)
Physical Therapy Evaluation and D/C Patient Details Name: Autumn Carson MRN: [id number] DOB: 1956-01-22 Today's Date: 04/17/2016   History of Present Illness  60 y.o.femalewith diabetes mellitus 2, hypertension, hypothyroidism, and hyperlipidemia who presented w/ complaints of persistent dizziness which started on September 4 and was associated with nausea vomiting and diarrhea. Diarrhea lasted for 3 days but the dizziness persisted and she was eventually placed on meclizine and Valium which did not help.  Clinical Impression  Pt admitted with above diagnosis. Pt currently without significant functional limitations and is Modif I.  Will need Outpt PT f/u for vestibular rehab but is modif I for all mobility using compensatory strategies.  All further therapy can be done at [facility].  Going home today per MD.   Follow Up Recommendations Outpatient PT;Supervision - Intermittent (vestibular rehab)    Equipment Recommendations  None recommended by PT    Recommendations for Other Services       Precautions / Restrictions Precautions Precautions: Fall Restrictions Weight Bearing Restrictions: No      Mobility  Bed Mobility Overal bed mobility: Independent                Transfers Overall transfer level: Independent                  Ambulation/Gait Ambulation/Gait assistance: Modified independent (Device/Increase time) Ambulation Distance (Feet): 350 Feet Assistive device: Rolling walker (2 wheeled) Gait Pattern/deviations: WFL(Within Functional Limits)   Gait velocity interpretation: Below normal speed for age/gender General Gait Details: No LOB with RW.  Good safety with RW.  Tried ambulationwithout RW and pt with one LOB.  Pt aware to use RW at home.Took socks off and put them back on.   Stairs            Wheelchair Mobility    Modified Rankin (Stroke Patients Only)       Balance Overall balance assessment: Needs assistance Sitting-balance  support: No upper extremity supported;Feet supported Sitting balance-Leahy Scale: Good     Standing balance support: No upper extremity supported;During functional activity Standing balance-Leahy Scale: Fair Standing balance comment: can stand statically without device but struggles with challenges at times.                              Pertinent Vitals/Pain Pain Assessment: No/denies pain  VSS    Home Living Family/patient expects to be discharged to:: Private residence Living Arrangements: Children Available Help at Discharge: Family;Available 24 hours/day (daughter injured so supervision only) Type of Home: Mobile home Home Access: Stairs to enter Entrance Stairs-Rails: Left Entrance Stairs-Number of Steps: 4 Home Layout: One level Home Equipment: Walker - 2 wheels;Cane - single point;Bedside commode;Shower seat      Prior Function Level of Independence: Independent with assistive device(s)         Comments: used cane     Hand Dominance   Dominant Hand: Right    Extremity/Trunk Assessment   Upper Extremity Assessment: Defer to OT evaluation           Lower Extremity Assessment: Generalized weakness      Cervical / Trunk Assessment: Normal  Communication   Communication: No difficulties  Cognition Arousal/Alertness: Awake/alert Behavior During Therapy: WFL for tasks assessed/performed Overall Cognitive Status: Within Functional Limits for tasks assessed                      General Comments General comments (skin integrity, edema,  etc.): Tested vestibular system with pt positive for right vestibular hypofunction.      Exercises Other Exercises Other Exercises: Initiated x1 exercises with pt and gave her progression of handout.     Assessment/Plan    PT Assessment All further PT needs can be met in the next venue of care  PT Problem List Decreased balance;Decreased activity tolerance          PT Treatment Interventions       PT Goals (Current goals can be found in the Care Plan section)  Acute Rehab PT Goals Patient Stated Goal: to go home today PT Goal Formulation: All assessment and education complete, DC therapy    Frequency     Barriers to discharge        Co-evaluation               End of Session Equipment Utilized During Treatment: Gait belt Activity Tolerance: Patient tolerated treatment well Patient left: in chair;with call bell/phone within reach Nurse Communication: Mobility status    Functional Assessment Tool Used: clinical judgment Functional Limitation: Mobility: Walking and moving around Mobility: Walking and Moving Around Current Status (T0757): At least 1 percent but less than 20 percent impaired, limited or restricted Mobility: Walking and Moving Around Goal Status [id number]): At least 1 percent but less than 20 percent impaired, limited or restricted Mobility: Walking and Moving Around Discharge Status [phone]): At least 1 percent but less than 20 percent impaired, limited or restricted    Time: 1032-1101 PT Time Calculation (min) (ACUTE ONLY): 29 min   Charges:   PT Evaluation $PT Eval Moderate Complexity: 1 Procedure PT Treatments $Gait Training: 8-22 mins   PT G Codes:   PT G-Codes **NOT FOR INPATIENT CLASS** Functional Assessment Tool Used: clinical judgment Functional Limitation: Mobility: Walking and moving around Mobility: Walking and Moving Around Current Status (Z8022): At least 1 percent but less than 20 percent impaired, limited or restricted Mobility: Walking and Moving Around Goal Status [phone]): At least 1 percent but less than 20 percent impaired, limited or restricted Mobility: Walking and Moving Around Discharge Status [phone]): At least 1 percent but less than 20 percent impaired, limited or restricted    Irwin Brakeman F 04/17/2016, 11:56 AM [facility] [phone] (pager)

## 2016-04-18 NOTE — Telephone Encounter (Signed)
Call placed to patient. LMTRC.  

## 2016-04-19 NOTE — Telephone Encounter (Signed)
Call placed to patient. LMTRC.  

## 2016-04-20 NOTE — Telephone Encounter (Signed)
Multiple calls placed to patient with no answer and no return call.   Message to be closed.  

## 2016-04-23 ENCOUNTER — Encounter: Payer: Self-pay | Admitting: Physical Therapy

## 2016-04-23 ENCOUNTER — Ambulatory Visit: Payer: Commercial Managed Care - HMO | Attending: Internal Medicine | Admitting: Physical Therapy

## 2016-04-23 DIAGNOSIS — H8111 Benign paroxysmal vertigo, right ear: Secondary | ICD-10-CM | POA: Diagnosis not present

## 2016-04-23 DIAGNOSIS — H8112 Benign paroxysmal vertigo, left ear: Secondary | ICD-10-CM | POA: Diagnosis not present

## 2016-04-23 DIAGNOSIS — R42 Dizziness and giddiness: Secondary | ICD-10-CM | POA: Diagnosis not present

## 2016-04-23 NOTE — Therapy (Signed)
[facility] [address] [place], [place], [address] Phone: [phone]   Fax:  [phone]  Physical Therapy Evaluation  Patient Details  Name: Autumn Carson MRN: [id number] Date of Birth: [birth year] Referring Provider: Orlean Patten, MD  Encounter Date: 04/23/2016      PT End of Session - 04/23/16 0948    Visit Number 1   Number of Visits 9  eval + 8 visits   Date for PT Re-Evaluation 05/23/16   Authorization Type [employer] (pt also reports having Medicare and Medicaid)   Authorization Time Period G Codes required   PT Start Time 0800   PT Stop Time 0846   PT Time Calculation (min) 46 min   Activity Tolerance Patient tolerated treatment well   Behavior During Therapy [facility] for tasks assessed/performed      Past Medical History:  Diagnosis Date  . Anxiety   . Arthritis of both knees   . Back pain   . Cancer ([place])    Thyroid  . Chronic anemia   . Essential hypertension   . GERD (gastroesophageal reflux disease)   . History of thyroid cancer 2011   Papillary thyroid cancer s/p radiation  . Hypothyroidism   . Lumbar spinal stenosis   . Obesity   . Seasonal allergies   . Type 2 diabetes mellitus with diabetic neuropathy [facility])     Past Surgical History:  Procedure Laterality Date  . BIOPSY N/A 01/16/2013   Procedure: BIOPSY;  Surgeon: Danie Binder, MD;  [facility];  Service: Endoscopy;  Laterality: N/A;  SMALL BOWEL ULCERS  . BRAVO [place] STUDY N/A 11/22/2015   Procedure: BRAVO [place];  Surgeon: Danie Binder, MD;  Location: [facility];  Service: Endoscopy;  Laterality: N/A;  . CARPAL TUNNEL RELEASE     4 total, bilaterally  . CESAREAN SECTION    . CHOLECYSTECTOMY  1990's  . COLONOSCOPY WITH ESOPHAGOGASTRODUODENOSCOPY (EGD) N/A 12/08/2012   SLF: 1. Normal mucosa in the terminal ileum 2. mild diverticulosis throughout the entire examined colon. 3. rectal bleeding due to moderate  sized internal hemorrhoids.   . ELBOW SURGERY Bilateral   . ENTEROSCOPY N/A 01/16/2013   Procedure: ENTEROSCOPY;  Surgeon: Danie Binder, MD;  Location[facility];  Service: Endoscopy;  Laterality: N/A;  2:00  . ESOPHAGOGASTRODUODENOSCOPY N/A 11/22/2015   Procedure: ESOPHAGOGASTRODUODENOSCOPY (EGD);  Surgeon: Danie Binder, MD;  Location[facility];  Service: Endoscopy;  Laterality: N/A;  130 - moved to 2:00 - office to notify pt  . GIVENS CAPSULE STUDY N/A 12/31/2012   Procedure: GIVENS CAPSULE STUDY;  Surgeon: Danie Binder, MD;  Location: [facility];  Service: Endoscopy;  Laterality: N/A;  7:30  . ROTATOR CUFF REPAIR Bilateral    bilaterally  . SAVORY DILATION N/A 11/22/2015   Procedure: SAVORY DILATION;  Surgeon: Danie Binder, MD;  Location: [facility];  Service: Endoscopy;  Laterality: N/A;  . SPINE SURGERY     Lumbar Fusion- Dr. Trenton Gammon  . TONSILECTOMY, ADENOIDECTOMY, BILATERAL MYRINGOTOMY AND TUBES  1987  . TOTAL THYROIDECTOMY  2011  . TUBAL LIGATION      There were no vitals filed for this visit.       Subjective Assessment - 04/23/16 0808    Subjective "I've had vertigo in the past...but this has lasted much longer. It started on September 4th. It's the worse when I'm on my back and look to the left and  look to the right. That's why I went to the doctor, because this isn't clearing up like it has before. The reason I went to the hospital because I kind of fell out."   Pertinent History PMH significant for: HTN, HLD, hypothyroidism, thyroid cancer (in remission), DM II, DDD (lumbar), R knee OA.   Patient Stated Goals "To figure out if I have vertigo, and If I do, to get it treated."   Currently in Pain? No/denies            [facility] PT Assessment - 04/23/16 0001      Assessment   Medical Diagnosis Vertigo   Referring Provider Orlean Patten, MD   Onset Date/Surgical Date 04/02/16     Precautions   Precautions Fall     Restrictions   Weight Bearing  Restrictions No     Balance Screen   Has the patient fallen in the past 6 months Yes   How many times? 1   Has the patient had a decrease in activity level because of a fear of falling?  Yes   Is the patient reluctant to leave their home because of a fear of falling?  Yes     [place] Private residence   [place]  lives with daughter   Type of [place] to enter   Entrance Stairs-Number of Steps [place] One level   [place] - single point;Walker - 4 wheels     Prior Function   Level of Independence Requires assistive device for independence  due to R knee OA   Vocation On disability   Leisure Likes to go to church, spending time with grandkids, and walking dog, Ruby     Cognition   Overall Cognitive Status Within Functional Limits for tasks assessed            Vestibular Assessment - 04/23/16 0001      Vestibular Assessment   General Observation Onset of "spinning" with positional change on 04/02/16; room-spinning has subsided and pt now describes a funny feeling in head with supine head turns.     Symptom Behavior   Type of Dizziness "Funny feeling in head"  lightheadedness, and imbalance   Frequency of Dizziness weekly   Duration of Dizziness seconds; up to one minute   Aggravating Factors Activity in general;Rolling to right;Rolling to left;Forward bending   Relieving Factors Head stationary;Rest     Occulomotor Exam   Occulomotor Alignment Normal   Spontaneous Absent   Gaze-induced Absent   Smooth Pursuits Intact   Saccades Intact   Comment (+) R Head Thrust Test     Vestibulo-Occular Reflex   VOR Cancellation Normal     Positional Testing   Dix-Hallpike Dix-Hallpike Right   Sidelying Test Sidelying Right;Sidelying Left   Horizontal Canal Testing Horizontal Canal Right;Horizontal Canal Left;Horizontal Canal Right  Intensity;Horizontal Canal Left Intensity     Dix-Hallpike Right   Dix-Hallpike Right Duration NA   Dix-Hallpike Right Symptoms No nystagmus  Unable to reproduce nystagmus seen in R Sidelying Test     Sidelying Right   Sidelying Right Duration x15 seconds   Sidelying Right Symptoms Upbeat, right rotatory nystagmus     Sidelying Left   Sidelying Left Duration NA   Sidelying Left Symptoms No nystagmus     Horizontal Canal Right   Horizontal Canal Right Duration x10 sec   Horizontal  Canal Right Symptoms Geotrophic;Nystagmus     Horizontal Canal Left   Horizontal Canal Left Duration x20 sec   Horizontal Canal Left Symptoms Nystagmus;Geotrophic     Horizontal Canal Right Intensity   Horizontal Canal Right Intensity Moderate     Horizontal Canal Left Intensity   Horizontal Canal Left Intensity Moderate   Left Intensity Comment Pt unable to decide if symptom intensity worse on L vs. R horizontal canal testing; therefore, treated based on amplitude/duration of nystagmus on L > R.               [facility] Adult PT Treatment/Exercise - 04/23/16 0001      Ambulation/Gait   Ambulation/Gait Yes   Ambulation/Gait Assistance 6: Modified independent (Device/Increase time)   Ambulation/Gait Assistance Details Minimal head movement; slow and guarded during ambulation. Gait pattern antalgic due to longstanding history of R knee OA.   Ambulation Distance (Feet) 100 Feet   Assistive device Straight cane   Gait Pattern Step-through pattern;Decreased arm swing - right;Decreased arm swing - left;Decreased stance time - right;Decreased weight shift to right;Wide base of support   Ambulation Surface Level;Indoor         Vestibular Treatment/Exercise - 04/23/16 0001      Vestibular Treatment/Exercise   Vestibular Treatment Provided Canalith Repositioning   Canalith Repositioning Canal Roll Left     Canal Roll Left   Number of Reps  1   Overall Response  No change   Response Details   Unable to formally reassess due to time constaint.               PT Education - 04/23/16 [phone]    Education provided Yes   Education Details PT eval findings, goals, and POC. Explained nature of BPPV, treatment, and what to expect after this session.   Person(s) Educated Patient   Methods Explanation   Comprehension Verbalized understanding          PT Short Term Goals - 04/23/16 0954      PT SHORT TERM GOAL #1   Title STG's = LTG's           PT Long Term Goals - 04/23/16 1001      PT LONG TERM GOAL #1   Title Positional vertigo testing will be negative to indicate resolved BPPV.  (Target date for all LTG's: 05/21/16)     PT LONG TERM GOAL #2   Title Pt will independently initiate vestibular HEP to maximize functional gains made in PT.     PT LONG TERM GOAL #3   Title Pt will decrease DHI score from 50 to < / = 32 to indicate significant decrease in pt-perceived disability due to dizziness.      PT LONG TERM GOAL #4   Title Assess strength and balance, if indicated, after vertigo clears.                Plan - 04/23/16 0949    Clinical Impression Statement Pt is a 60 y/o F referred to outpatient PT to address vertigo. PMH significant for: HTN, HLD, hypothyroidism, thyroid cancer (in remission), DM II, DDD (lumbar), R knee OA. PT evaluation reveals the following: (+) R Head Thrust Test; R Sidelying Test with upbeating, right torsional nystagmus; horizontal canal testing with agetrophic nystagmus (amplitude, duration of nystagmus on L > R). Based on findings, unable to rule out R posterior canalithiasis, L horizontal canalithiasis, and vestibular hypofunction.  Pt will benefit from skilled outpatient PT 2x/week for 4 weeks to  address said impairments.    Rehab Potential Good   PT Frequency 2x / week   PT Duration 4 weeks   PT Treatment/Interventions ADLs/Self Care Home Management;Canalith Repostioning;Gait training;Stair training;Functional mobility  training;Therapeutic activities;Patient/family education;Neuromuscular re-education;Balance training;Therapeutic exercise;Vestibular   PT Next Visit Plan Reassess for BPPV (L HC then R PC) and treat prn. Intiate HEP for habituation   Consulted and Agree with Plan of Care Patient      Patient will benefit from skilled therapeutic intervention in order to improve the following deficits and impairments:  Dizziness  Visit Diagnosis: BPPV (benign paroxysmal positional vertigo), right - Plan: PT plan of care cert/re-cert  BPPV (benign paroxysmal positional vertigo), left - Plan: PT plan of care cert/re-cert  Dizziness and giddiness - Plan: PT plan of care cert/re-cert      G-Codes - [id number] 1005    Functional Assessment Tool Used DHI = 50   Functional Limitation Self care   Self Care Current Status [id number]) At least 40 percent but less than 60 percent impaired, limited or restricted   Self Care Goal Status [id number]) At least 20 percent but less than 40 percent impaired, limited or restricted       Problem List Patient Active Problem List   Diagnosis Date Noted  . Controlled diabetes mellitus type 2 with complications ([place])   . Bradycardia   . Angioedema 04/15/2016  . Dizziness 04/15/2016  . Dysphagia   . Dysphagia, idiopathic 10/27/2015  . Atypical chest pain 07/19/2015  . OA (osteoarthritis) of knee 06/23/2014  . Knee pain, right 09/16/2013  . Spinal stenosis, lumbar region, with neurogenic claudication 04/07/2013  . Positive for microalbuminuria 02/10/2013  . Hypothyroidism 02/10/2013  . Morbid obesity ([place]) 02/10/2013  . DDD (degenerative disc disease), lumbar 12/16/2012  . FH: colon cancer 11/26/2012  . Chronic anemia 11/26/2012  . Unspecified constipation 11/26/2012  . Headache(784.0) 09/15/2012  . Head injury, unspecified 09/15/2012  . Depression with anxiety 07/29/2012  . Diabetic neuropathy ([place]) 06/12/2012  . Leg swelling 06/12/2012  . GERD 04/12/2009  . Diabetes  mellitus, type II ([place]) 01/13/2009  . Hyperlipidemia 01/13/2009  . Essential hypertension 01/13/2009  . OSTEOARTHRITIS 01/13/2009    Billie Ruddy, PT, DPT [facility] [address] [place], [place], [address] Phone: [phone]   Fax:  [phone] 04/23/16, 10:23 AM  Name: Autumn Carson MRN: [id number] Date of Birth: 07-05-1956

## 2016-04-24 ENCOUNTER — Ambulatory Visit: Payer: Commercial Managed Care - HMO | Admitting: Physical Therapy

## 2016-04-24 DIAGNOSIS — H8112 Benign paroxysmal vertigo, left ear: Secondary | ICD-10-CM | POA: Diagnosis not present

## 2016-04-24 DIAGNOSIS — R42 Dizziness and giddiness: Secondary | ICD-10-CM | POA: Diagnosis not present

## 2016-04-24 DIAGNOSIS — H8111 Benign paroxysmal vertigo, right ear: Secondary | ICD-10-CM | POA: Diagnosis not present

## 2016-04-24 NOTE — Patient Instructions (Signed)
Gaze Stabilization: Tip Card 1.Target must remain in focus, not blurry, and appear stationary while head is in motion. 2.Perform exercises with small head movements (45 to either side of midline). 3.Increase speed of head motion so long as target is in focus. 4.If you wear eyeglasses, be sure you can see target through lens (therapist will give specific instructions for bifocal / progressive lenses). 5.These exercises may provoke dizziness or nausea. Work through these symptoms. If too dizzy, slow head movement slightly. Rest between each exercise. 6.Exercises demand concentration; avoid distractions. 7.For safety, perform standing exercises close to a counter, wall, corner, or next to someone.  Copyright  VHI. All rights reserved.  Gaze Stabilization: Standing Feet Apart   Wear your glasses for this exercise. Stand with feet shoulder width apart, keeping eyes on target (at eye-level) on wall 3 feet away, tilt head down slightly and move head side to side for 30 seconds. Repeat while moving head up and down for 30 seconds. Do 2 sessions per day. Copyright  VHI. All rights reserved.   You can try this exercise if you feel like you have positional vertigo in the future. If you ever experience vertigo that is constant, occurs at rest, or does not change with head/body movement (getting in and out of bed), seek immediate medical attention.   Tip Card 1.The goal of habituation training is to assist in decreasing symptoms of vertigo, dizziness, or nausea provoked by specific head and body motions. 2.These exercises may initially increase symptoms; however, be persistent and work through symptoms. With repetition and time, the exercises will assist in reducing or eliminating symptoms. 3.Exercises should be stopped and discussed with the therapist if you experience any of the following: - Sudden change or fluctuation in hearing - New onset of ringing in the ears, or increase in current intensity -  Any fluid discharge from the ear - Severe pain in neck or back - Extreme nausea  Copyright  VHI. All rights reserved.  Rolling   With pillow under head, start on back. Roll to your right side.  Hold until dizziness stops, plus 20 seconds and then roll to the left side.  Hold until dizziness stops, plus 20 seconds.  Repeat sequence 5 times per session. Do 2 sessions per day.  Copyright  VHI. All rights reserved.  Sit to Side-Lying   Sit on edge of bed. Lie down onto the right side and hold until dizziness stops, plus 20 seconds.  Return to sitting and wait until dizziness stops, plus 20 seconds.  Repeat to the left side. Repeat sequence 5 times per session. Do 2 sessions per day.  Copyright  VHI. All rights reserved.

## 2016-04-24 NOTE — Therapy (Signed)
[place] [address] [place], [place], [address] Phone: [phone]   Fax:  [phone]  Physical Therapy Treatment and Discharge Summary  Patient Details  Name: Autumn Carson MRN: [id number] Date of Birth: [birth year] Referring Provider: Orlean Patten, MD  Encounter Date: 04/24/2016      PT End of Session - 04/24/16 1026    Visit Number 2   Number of Visits 9   Date for PT Re-Evaluation 05/23/16   Authorization Type [employer] (pt also reports having Medicare and Medicaid)   Authorization Time Period G Codes required   PT Start Time 0932   PT Stop Time [phone]  Session ended early due to all goals met, patient discharged   PT Time Calculation (min) 27 min   Activity Tolerance Patient tolerated treatment well   Behavior During Therapy [facility] for tasks assessed/performed      Past Medical History:  Diagnosis Date  . Anxiety   . Arthritis of both knees   . Back pain   . Cancer ([place])    Thyroid  . Chronic anemia   . Essential hypertension   . GERD (gastroesophageal reflux disease)   . History of thyroid cancer 2011   Papillary thyroid cancer s/p radiation  . Hypothyroidism   . Lumbar spinal stenosis   . Obesity   . Seasonal allergies   . Type 2 diabetes mellitus with diabetic neuropathy [facility])     Past Surgical History:  Procedure Laterality Date  . BIOPSY N/A 01/16/2013   Procedure: BIOPSY;  Surgeon: Danie Binder, MD;  [facility];  Service: Endoscopy;  Laterality: N/A;  SMALL BOWEL ULCERS  . BRAVO [place] STUDY N/A 11/22/2015   Procedure: BRAVO [place];  Surgeon: Danie Binder, MD;  [facility];  Service: Endoscopy;  Laterality: N/A;  . CARPAL TUNNEL RELEASE     4 total, bilaterally  . CESAREAN SECTION    . CHOLECYSTECTOMY  1990's  . COLONOSCOPY WITH ESOPHAGOGASTRODUODENOSCOPY (EGD) N/A 12/08/2012   SLF: 1. Normal mucosa in the terminal ileum 2. mild diverticulosis  throughout the entire examined colon. 3. rectal bleeding due to moderate sized internal hemorrhoids.   . ELBOW SURGERY Bilateral   . ENTEROSCOPY N/A 01/16/2013   Procedure: ENTEROSCOPY;  Surgeon: Danie Binder, MD;  [facility];  Service: Endoscopy;  Laterality: N/A;  2:00  . ESOPHAGOGASTRODUODENOSCOPY N/A 11/22/2015   Procedure: ESOPHAGOGASTRODUODENOSCOPY (EGD);  Surgeon: Danie Binder, MD;  [facility];  Service: Endoscopy;  Laterality: N/A;  130 - moved to 2:00 - office to notify pt  . GIVENS CAPSULE STUDY N/A 12/31/2012   Procedure: GIVENS CAPSULE STUDY;  Surgeon: Danie Binder, MD;  [facility];  Service: Endoscopy;  Laterality: N/A;  7:30  . ROTATOR CUFF REPAIR Bilateral    bilaterally  . SAVORY DILATION N/A 11/22/2015   Procedure: SAVORY DILATION;  Surgeon: Danie Binder, MD;  [facility];  Service: Endoscopy;  Laterality: N/A;  . SPINE SURGERY     Lumbar Fusion- Dr. Trenton Gammon  . TONSILECTOMY, ADENOIDECTOMY, BILATERAL MYRINGOTOMY AND TUBES  1987  . TOTAL THYROIDECTOMY  2011  . TUBAL LIGATION      There were no vitals filed for this visit.      Subjective Assessment - 04/24/16 0938    Subjective Pt arrived to session without cane. "Last night I did the exercise you gave me in the bed... I feel better."  Pertinent History PMH significant for: HTN, HLD, hypothyroidism, thyroid cancer (in remission), DM II, DDD (lumbar), R knee OA.   Patient Stated Goals "To figure out if I have vertigo, and If I do, to get it treated."   Currently in Pain? No/denies                Vestibular Assessment - 04/24/16 0001      Positional Testing   Sidelying Test Sidelying Right;Sidelying Left   Horizontal Canal Testing Horizontal Canal Right;Horizontal Canal Left     Sidelying Right   Sidelying Right Duration NA   Sidelying Right Symptoms No nystagmus     Sidelying Left   Sidelying Left Duration NA   Sidelying Left Symptoms No nystagmus      Horizontal Canal Right   Horizontal Canal Right Duration nA   Horizontal Canal Right Symptoms Normal     Horizontal Canal Left   Horizontal Canal Left Duration NA   Horizontal Canal Left Symptoms Normal                  Vestibular Treatment/Exercise - 04/24/16 0001      Vestibular Treatment/Exercise   Vestibular Treatment Provided Habituation;Gaze   Habituation Exercises Horizontal Roll;Brandt Daroff   Gaze Exercises X1 Viewing Horizontal;X1 Viewing Vertical     Nestor Lewandowsky   Number of Reps  2   Symptom Description  with verbal/demo cueing for technique     Horizontal Roll   Number of Reps  2   Symptom Description  with verbal cueing for technique     X1 Viewing Horizontal   Foot Position standing; feet shoulder width apart   Reps 2   Comments 2 x30-sec trial with cueing for technique     X1 Viewing Vertical   Foot Position standing; shoulder width   Reps 1   Comments 30 seconds with cueing for technique               PT Education - 04/23/16 0942    Education provided Yes   Education Details PT eval findings, goals, and POC. Explained nature of BPPV, treatment, and what to expect after this session.   Person(s) Educated Patient   Methods Explanation   Comprehension Verbalized understanding          PT Short Term Goals - 04/23/16 0954      PT SHORT TERM GOAL #1   Title STG's = LTG's           PT Long Term Goals - 04/24/16 1023      PT LONG TERM GOAL #1   Title Positional vertigo testing will be negative to indicate resolved BPPV.  (Target date for all LTG's: 05/21/16)   Baseline Met 04/24/16.   Status Achieved     PT LONG TERM GOAL #2   Title Pt will independently initiate vestibular HEP to maximize functional gains made in PT.   Baseline Met 9/26.   Status Achieved     PT LONG TERM GOAL #3   Title Pt will decrease DHI score from 50 to < / = 32 to indicate significant decrease in pt-perceived disability due to dizziness.     Baseline 9/26: DHI = 4   Status Achieved     PT LONG TERM GOAL #4   Title Assess strength and balance, if indicated, after vertigo clears.    Status Achieved               Plan - 04/24/16 1027  Clinical Impression Statement All positonal testing negative and asymptomatic. DHI score improved from 50 to 4, suggesting significant improvement in QoL related to dizziness. Therefore, pt will be discharged from vestibular PT at this time. Session focused on educating pt on self-management of episodic vertigo. Pt verbalized understanding and gave effective return demo of all education.    Consulted and Agree with Plan of Care Patient      Patient will benefit from skilled therapeutic intervention in order to improve the following deficits and impairments:     Visit Diagnosis: Dizziness and giddiness       G-Codes - 04-29-2016 1023    Functional Assessment Tool Used DHI = 4   Functional Limitation Self care   Self Care Goal Status (T0626) At least 20 percent but less than 40 percent impaired, limited or restricted   Self Care Discharge Status [phone]) At least 1 percent but less than 20 percent impaired, limited or restricted      Problem List Patient Active Problem List   Diagnosis Date Noted  . Controlled diabetes mellitus type 2 with complications ([place])   . Bradycardia   . Angioedema 04/15/2016  . Dizziness 04/15/2016  . Dysphagia   . Dysphagia, idiopathic 10/27/2015  . Atypical chest pain 07/19/2015  . OA (osteoarthritis) of knee 06/23/2014  . Knee pain, right 09/16/2013  . Spinal stenosis, lumbar region, with neurogenic claudication 04/07/2013  . Positive for microalbuminuria 02/10/2013  . Hypothyroidism 02/10/2013  . Morbid obesity ([place]) 02/10/2013  . DDD (degenerative disc disease), lumbar 12/16/2012  . FH: colon cancer 11/26/2012  . Chronic anemia 11/26/2012  . Unspecified constipation 11/26/2012  . Headache(784.0) 09/15/2012  . Head injury, unspecified  09/15/2012  . Depression with anxiety 07/29/2012  . Diabetic neuropathy ([place]) 06/12/2012  . Leg swelling 06/12/2012  . GERD 04/12/2009  . Diabetes mellitus, type II ([place]) 01/13/2009  . Hyperlipidemia 01/13/2009  . Essential hypertension 01/13/2009  . OSTEOARTHRITIS 01/13/2009    PHYSICAL THERAPY DISCHARGE SUMMARY  Visits from Start of Care: 2  Current functional level related to goals / functional outcomes: See above.  Remaining deficits: See above.   Education / Equipment: See above.  Plan: Patient agrees to discharge.  Patient goals were met. Patient is being discharged due to meeting the stated rehab goals.  ?????           Billie Ruddy, PT, DPT [facility] [address] [place], [place], [address] Phone: [phone]   Fax:  [phone] Apr 29, 2016, 12:18 PM  Name: Autumn Carson MRN: [id number] Date of Birth: 09/01/1955

## 2016-04-26 ENCOUNTER — Encounter: Payer: Self-pay | Admitting: Adult Health

## 2016-04-26 ENCOUNTER — Ambulatory Visit (INDEPENDENT_AMBULATORY_CARE_PROVIDER_SITE_OTHER): Payer: Commercial Managed Care - HMO | Admitting: Adult Health

## 2016-04-26 VITALS — BP 144/88 | HR 80 | Ht 67.0 in | Wt 313.0 lb

## 2016-04-26 DIAGNOSIS — R001 Bradycardia, unspecified: Secondary | ICD-10-CM

## 2016-04-26 DIAGNOSIS — I10 Essential (primary) hypertension: Secondary | ICD-10-CM | POA: Diagnosis not present

## 2016-04-26 DIAGNOSIS — E7800 Pure hypercholesterolemia, unspecified: Secondary | ICD-10-CM | POA: Diagnosis not present

## 2016-04-26 MED ORDER — HYDRALAZINE HCL 10 MG PO TABS: 5.0000 mg | ORAL_TABLET | Freq: Three times a day (TID) | ORAL | 3 refills | Status: DC

## 2016-04-26 NOTE — Patient Instructions (Signed)
Your physician wants you to follow-up in: 6 Months with Dr. McDowell.  You will receive a reminder letter in the mail two months in advance. If you don't receive a letter, please call our office to schedule the follow-up appointment.  Your physician recommends that you continue on your current medications as directed. Please refer to the Current Medication list given to you today.  If you need a refill on your cardiac medications before your next appointment, please call your pharmacy.  Thank you for choosing [facility]! '  

## 2016-04-26 NOTE — Progress Notes (Signed)
Cardiology Office Note   Date:  04/26/2016   ID:  Autumn Carson, DOB [birth year], MRN [id number]  PCP:  Vic Blackbird, MD  Cardiologist: McDowell/  Jory Sims, NP   Chief Complaint  Patient presents with  . Chest Pain      History of Present Illness: Autumn Carson is a 60 y.o. female who presents for chest discomfort with hx of diabetes, hypertension, mild hyperlipidemia. A Cardiolite stress test was ordered for evaluation.    There was no ST segment deviation noted during stress.  The study is normal.  This is a low risk study.  Nuclear stress EF: 62%.  Left ventricle: The cavity size was moderately dilated. There was   mild concentric hypertrophy. Systolic function was normal. The   estimated ejection fraction was in the range of 60% to 65%. Wall   motion was normal; there were no regional wall motion   abnormalities. Left ventricular diastolic function parameters   were normal. Doppler parameters are consistent with indeterminate   ventricular filling pressure. - Aortic valve: Transvalvular velocity was within the normal range.   There was no stenosis. There was no regurgitation. - Mitral valve: Transvalvular velocity was within the normal range.   There was no evidence for stenosis. There was no regurgitation. - Left atrium: The atrium was severely dilated. - Right ventricle: The cavity size was mildly dilated. Wall   thickness was normal. Systolic function was normal. - Tricuspid valve: There was no regurgitation.  She was recently discharged from the hospital after having dizziness. She was taken off of lisinopril, Lasix, and metoprolol. She was also found to have bradycardia with heart rates in the 40s and 50s prior to removing beta blocker. Of note she was also treated for angioedema which was thought to be related to ACE inhibitor. She was changed to Hydralazine every 8 hours at a dose of 5 mg (one half tablet every 8 hours).   She is  feeling much better was concerned that she was going to have to have a pacemaker. She is having trouble cutting the hydralazine pills in half as they're small. She requests that we send Rx to her sendoff pharmacy through [facility]   Past Medical History:  Diagnosis Date  . Anxiety   . Arthritis of both knees   . Back pain   . Cancer ([place])    Thyroid  . Chronic anemia   . Essential hypertension   . GERD (gastroesophageal reflux disease)   . History of thyroid cancer 2011   Papillary thyroid cancer s/p radiation  . Hypothyroidism   . Lumbar spinal stenosis   . Obesity   . Seasonal allergies   . Type 2 diabetes mellitus with diabetic neuropathy [facility])     Past Surgical History:  Procedure Laterality Date  . BIOPSY N/A 01/16/2013   Procedure: BIOPSY;  Surgeon: Danie Binder, MD;  [facility];  Service: Endoscopy;  Laterality: N/A;  SMALL BOWEL ULCERS  . BRAVO [place] STUDY N/A 11/22/2015   Procedure: BRAVO [place];  Surgeon: Danie Binder, MD;  [facility];  Service: Endoscopy;  Laterality: N/A;  . CARPAL TUNNEL RELEASE     4 total, bilaterally  . CESAREAN SECTION    . CHOLECYSTECTOMY  1990's  . COLONOSCOPY WITH ESOPHAGOGASTRODUODENOSCOPY (EGD) N/A 12/08/2012   SLF: 1. Normal mucosa in the terminal ileum 2. mild diverticulosis throughout the entire examined colon. 3. rectal bleeding due to moderate sized internal hemorrhoids.   Marland Kitchen  ELBOW SURGERY Bilateral   . ENTEROSCOPY N/A 01/16/2013   Procedure: ENTEROSCOPY;  Surgeon: Danie Binder, MD;  [facility];  Service: Endoscopy;  Laterality: N/A;  2:00  . ESOPHAGOGASTRODUODENOSCOPY N/A 11/22/2015   Procedure: ESOPHAGOGASTRODUODENOSCOPY (EGD);  Surgeon: Danie Binder, MD;  [facility];  Service: Endoscopy;  Laterality: N/A;  130 - moved to 2:00 - office to notify pt  . GIVENS CAPSULE STUDY N/A 12/31/2012   Procedure: GIVENS CAPSULE STUDY;  Surgeon: Danie Binder, MD;  [facility];   Service: Endoscopy;  Laterality: N/A;  7:30  . ROTATOR CUFF REPAIR Bilateral    bilaterally  . SAVORY DILATION N/A 11/22/2015   Procedure: SAVORY DILATION;  Surgeon: Danie Binder, MD;  [facility];  Service: Endoscopy;  Laterality: N/A;  . SPINE SURGERY     Lumbar Fusion- Dr. Trenton Gammon  . TONSILECTOMY, ADENOIDECTOMY, BILATERAL MYRINGOTOMY AND TUBES  1987  . TOTAL THYROIDECTOMY  2011  . TUBAL LIGATION       Current Outpatient Prescriptions  Medication Sig Dispense Refill  . albuterol (PROVENTIL HFA;VENTOLIN HFA) 108 (90 BASE) MCG/ACT inhaler Inhale 2 puffs into the lungs every 6 (six) hours as needed for wheezing. 1 Inhaler 2  . aspirin 81 MG tablet Take 81 mg by mouth every other day.    . cyclobenzaprine (FLEXERIL) 10 MG tablet TAKE 1 TABLET BY MOUTH EVERY 8 HOURS AS NEEDED FOR MUSCLE SPASMS (Patient taking differently: Take 10 mg by mouth every 8 (eight) hours as needed for muscle spasms. ) 45 tablet 2  . diazepam (VALIUM) 5 MG tablet Take 1 tablet (5 mg total) by mouth every 12 (twelve) hours as needed for anxiety. 20 tablet 0  . FLUoxetine (PROZAC) 40 MG capsule TAKE 1 CAPSULE EVERY DAY BEFORE BREAKFAST (REQUIRE OFFICE VISIT BEFORE ANY FURTHER REFILLS CAN BE GIVEN) (Patient taking differently: TAKE 1 CAPSULE (40 mg) EVERY DAY BEFORE BREAKFAST) 90 capsule 0  . fluticasone (FLONASE) 50 MCG/ACT nasal spray PLACE 2 SPRAYS INTO BOTH NOSTRILS DAILY. (Patient taking differently: PLACE 2 SPRAYS INTO BOTH NOSTRILS every evening) 48 g 3  . gabapentin (NEURONTIN) 300 MG capsule TAKE 1 CAPSULE TWICE DAILY (Patient taking differently: TAKE 1 CAPSULE (300 mg) TWICE DAILY) 180 capsule 1  . hydrALAZINE (APRESOLINE) 10 MG tablet Take 0.5 tablets (5 mg total) by mouth every 8 (eight) hours. 30 tablet 0  . metFORMIN (GLUCOPHAGE) 1000 MG tablet TAKE 1 TABLET TWICE DAILY EVERY MORNING  AND EVERY EVENING 180 tablet 1  . naproxen (NAPROSYN) 500 MG tablet TAKE 1 TABLET TWICE DAILY WITH MEALS 180 tablet  1  . omeprazole (PRILOSEC) 40 MG capsule 1 PO 30 MINS PRIOR TO MEALS BID (Patient taking differently: Take 40 mg by mouth 2 (two) times daily. 30 MINS PRIOR TO MEALS) 180 capsule 3  . promethazine (PHENERGAN) 12.5 MG tablet Take 1 tablet (12.5 mg total) by mouth every 8 (eight) hours as needed for nausea or vomiting. 20 tablet 0  . simvastatin (ZOCOR) 40 MG tablet Take 1 tablet (40 mg total) by mouth daily. 90 tablet 3  . SYNTHROID 125 MCG tablet Take 250 mcg by mouth daily before breakfast.     . zolpidem (AMBIEN) 5 MG tablet TAKE 1 TABLET BY MOUTH EVERY NIGHT AT BEDTIME AS NEEDED FOR SLEEP (Patient taking differently: TAKE 1 TABLET (5 mg) BY MOUTH EVERY NIGHT AT BEDTIME AS NEEDED FOR SLEEP) 15 tablet 3   No current facility-administered medications for this  visit.     Allergies:   Grapefruit oil and Lisinopril    Social History:  The patient  reports that she has never smoked. She has never used smokeless tobacco. She reports that she does not drink alcohol or use drugs.   Family History:  The patient's family history includes Colon cancer in her brother; Heart attack (age of onset: 57) in her daughter; Heart disease in her mother; Hypertension in her daughter; Lung cancer in her father; Other in her brother.    ROS: All other systems are reviewed and negative. Unless otherwise mentioned in H&P    PHYSICAL EXAM: VS:  BP (!) 144/88   Pulse 80   Ht 5\' 7"  (1.702 m)   Wt (!) 313 lb (142 kg)   LMP 09/07/2011   SpO2 96%   BMI 49.02 kg/m  , BMI Body mass index is 49.02 kg/m. GEN: Well nourished, well developed, in no acute distress morbidly obese HEENT: normal  Neck: no JVD, carotid bruits, or masses Cardiac: RRR; no murmurs, rubs, or gallops,no edema  Respiratory:  clear to auscultation bilaterally, normal work of breathing GI: soft, nontender, nondistended, + BS MS: no deformity or atrophy  Skin: warm and dry, no rash Neuro:  Strength and sensation are intact Psych: euthymic  mood, full affect  Recent Labs: 04/15/2016: TSH 0.304 04/16/2016: ALT 12; BUN 11; Creatinine, Ser 0.96; Hemoglobin 10.6; Platelets 262; Potassium 3.9; Sodium 141    Lipid Panel    Component Value Date/Time   CHOL 185 04/06/2016 1101   TRIG 121 04/06/2016 1101   HDL 52 04/06/2016 1101   CHOLHDL 3.6 04/06/2016 1101   VLDL 24 04/06/2016 1101   LDLCALC 109 04/06/2016 1101      Wt Readings from Last 3 Encounters:  04/26/16 (!) 313 lb (142 kg)  04/16/16 296 lb 4.8 oz (134.4 kg)  04/11/16 (!) 308 lb (139.7 kg)       ASSESSMENT AND PLAN:  1.  Dizziness: Felt to be related to bradycardia and hypotension. Metoprolol was discontinued along with lisinopril and Lasix. She is feeling much better off that medication regimen. I explained to her that she was not in need of a pacemaker at this time. Was felt that the metoprolol was causing the bradycardia.  2. Hypertension: Recent hospitalization for angioedema in the setting of ACE inhibitor use. This was late presentation and she had been on it for several years prior. She is now on hydralazine 10 mg, (one half tablet every 8 hours). An Rx is sent for her medication to be sent off with instructions to cut pills in half. She's only been taking the medication twice a day. Have explained her that this is a 3 times a day drug. She verbalizes understanding. We will see her again in 6 months. She is to see primary care next month for labs.  Of note, lisinopril is been added to her allergy list.  She is encouraged to exercise by taking a walk daily. She states that she was walking in the past and tells she began to feel badly. Now that she is feeling better she may restart this regimen.   3. Hyperlipidemia: Continues on statin therapy without any myalgia pain. Labs are going to be drawn next week by PCP, will request copy   Current medicines are reviewed at length with the patient today.    Labs/ tests ordered today include:  No orders of the defined  types were placed in this encounter.  Disposition:   FU with 6 months unless symptomatic  Signed, Jory Sims, NP  04/26/2016 1:16 PM    Autumn Carson [address], [place], [place] [address] Phone: [phone]; Fax: [phone]

## 2016-04-26 NOTE — Progress Notes (Signed)
Name: Autumn Carson    DOB: November 25, 1955  Age: 60 y.o.  MR#: [id number]       PCP:  Vic Blackbird, MD      Insurance: Payor: [email or web address] MEDICARE / Plan: [employer] THN/NTSP / Product Type: *No Product type* /   CC:    Chief Complaint  Patient presents with  . Chest Pain    VS Vitals:   04/26/16 1305  Weight: (!) 313 lb (142 kg)  Height: 5\' 7"  (1.702 m)    Weights Current Weight  04/26/16 (!) 313 lb (142 kg)  04/16/16 296 lb 4.8 oz (134.4 kg)  04/11/16 (!) 308 lb (139.7 kg)    Blood Pressure  BP Readings from Last 3 Encounters:  04/17/16 (!) 161/79  04/11/16 140/78  04/06/16 132/78     Admit date:  (Not on file) Last encounter with RMR:  Visit date not found   Allergy Grapefruit oil and Lisinopril  Current Outpatient Prescriptions  Medication Sig Dispense Refill  . albuterol (PROVENTIL HFA;VENTOLIN HFA) 108 (90 BASE) MCG/ACT inhaler Inhale 2 puffs into the lungs every 6 (six) hours as needed for wheezing. 1 Inhaler 2  . aspirin 81 MG tablet Take 81 mg by mouth every other day.    . cyclobenzaprine (FLEXERIL) 10 MG tablet TAKE 1 TABLET BY MOUTH EVERY 8 HOURS AS NEEDED FOR MUSCLE SPASMS (Patient taking differently: Take 10 mg by mouth every 8 (eight) hours as needed for muscle spasms. ) 45 tablet 2  . diazepam (VALIUM) 5 MG tablet Take 1 tablet (5 mg total) by mouth every 12 (twelve) hours as needed for anxiety. 20 tablet 0  . FLUoxetine (PROZAC) 40 MG capsule TAKE 1 CAPSULE EVERY DAY BEFORE BREAKFAST (REQUIRE OFFICE VISIT BEFORE ANY FURTHER REFILLS CAN BE GIVEN) (Patient taking differently: TAKE 1 CAPSULE (40 mg) EVERY DAY BEFORE BREAKFAST) 90 capsule 0  . fluticasone (FLONASE) 50 MCG/ACT nasal spray PLACE 2 SPRAYS INTO BOTH NOSTRILS DAILY. (Patient taking differently: PLACE 2 SPRAYS INTO BOTH NOSTRILS every evening) 48 g 3  . gabapentin (NEURONTIN) 300 MG capsule TAKE 1 CAPSULE TWICE DAILY (Patient taking differently: TAKE 1 CAPSULE (300 mg) TWICE DAILY) 180  capsule 1  . hydrALAZINE (APRESOLINE) 10 MG tablet Take 0.5 tablets (5 mg total) by mouth every 8 (eight) hours. 30 tablet 0  . metFORMIN (GLUCOPHAGE) 1000 MG tablet TAKE 1 TABLET TWICE DAILY EVERY MORNING  AND EVERY EVENING 180 tablet 1  . naproxen (NAPROSYN) 500 MG tablet TAKE 1 TABLET TWICE DAILY WITH MEALS 180 tablet 1  . omeprazole (PRILOSEC) 40 MG capsule 1 PO 30 MINS PRIOR TO MEALS BID (Patient taking differently: Take 40 mg by mouth 2 (two) times daily. 30 MINS PRIOR TO MEALS) 180 capsule 3  . promethazine (PHENERGAN) 12.5 MG tablet Take 1 tablet (12.5 mg total) by mouth every 8 (eight) hours as needed for nausea or vomiting. 20 tablet 0  . simvastatin (ZOCOR) 40 MG tablet Take 1 tablet (40 mg total) by mouth daily. 90 tablet 3  . SYNTHROID 125 MCG tablet Take 250 mcg by mouth daily before breakfast.     . zolpidem (AMBIEN) 5 MG tablet TAKE 1 TABLET BY MOUTH EVERY NIGHT AT BEDTIME AS NEEDED FOR SLEEP (Patient taking differently: TAKE 1 TABLET (5 mg) BY MOUTH EVERY NIGHT AT BEDTIME AS NEEDED FOR SLEEP) 15 tablet 3   No current facility-administered medications for this visit.     Discontinued Meds:   There are no discontinued medications.  Patient Active Problem List   Diagnosis Date Noted  . Controlled diabetes mellitus type 2 with complications ([place])   . Bradycardia   . Angioedema 04/15/2016  . Dizziness 04/15/2016  . Dysphagia   . Dysphagia, idiopathic 10/27/2015  . Atypical chest pain 07/19/2015  . OA (osteoarthritis) of knee 06/23/2014  . Knee pain, right 09/16/2013  . Spinal stenosis, lumbar region, with neurogenic claudication 04/07/2013  . Positive for microalbuminuria 02/10/2013  . Hypothyroidism 02/10/2013  . Morbid obesity ([place]) 02/10/2013  . DDD (degenerative disc disease), lumbar 12/16/2012  . FH: colon cancer 11/26/2012  . Chronic anemia 11/26/2012  . Unspecified constipation 11/26/2012  . Headache(784.0) 09/15/2012  . Head injury, unspecified 09/15/2012  .  Depression with anxiety 07/29/2012  . Diabetic neuropathy ([place]) 06/12/2012  . Leg swelling 06/12/2012  . GERD 04/12/2009  . Diabetes mellitus, type II ([place]) 01/13/2009  . Hyperlipidemia 01/13/2009  . Essential hypertension 01/13/2009  . OSTEOARTHRITIS 01/13/2009    LABS    Component Value Date/Time   NA 141 04/16/2016 0306   NA 141 04/15/2016 1126   NA 141 04/06/2016 1101   K 3.9 04/16/2016 0306   K 4.1 04/15/2016 1126   K 4.3 04/06/2016 1101   CL 105 04/16/2016 0306   CL 105 04/15/2016 1126   CL 103 04/06/2016 1101   CO2 25 04/16/2016 0306   CO2 27 04/15/2016 1126   CO2 28 04/06/2016 1101   GLUCOSE 183 (H) 04/16/2016 0306   GLUCOSE 112 (H) 04/15/2016 1126   GLUCOSE 109 (H) 04/06/2016 1101   BUN 11 04/16/2016 0306   BUN 13 04/15/2016 1126   BUN 11 04/06/2016 1101   CREATININE 0.96 04/16/2016 0306   CREATININE 0.95 04/15/2016 1126   CREATININE 0.92 04/06/2016 1101   CREATININE 1.11 (H) 07/19/2015 1101   CREATININE 1.01 01/18/2015 0929   CALCIUM 9.1 04/16/2016 0306   CALCIUM 9.1 04/15/2016 1126   CALCIUM 9.1 04/06/2016 1101   GFRNONAA >60 04/16/2016 0306   GFRNONAA >60 04/15/2016 1126   GFRNONAA 76 (L) 04/01/2013 0907   GFRAA >60 04/16/2016 0306   GFRAA >60 04/15/2016 1126   GFRAA 88 (L) 04/01/2013 0907   CMP     Component Value Date/Time   NA 141 04/16/2016 0306   K 3.9 04/16/2016 0306   CL 105 04/16/2016 0306   CO2 25 04/16/2016 0306   GLUCOSE 183 (H) 04/16/2016 0306   BUN 11 04/16/2016 0306   CREATININE 0.96 04/16/2016 0306   CREATININE 0.92 04/06/2016 1101   CALCIUM 9.1 04/16/2016 0306   PROT 6.9 04/16/2016 0306   ALBUMIN 3.9 04/16/2016 0306   AST 14 (L) 04/16/2016 0306   ALT 12 (L) 04/16/2016 0306   ALKPHOS 66 04/16/2016 0306   BILITOT 0.3 04/16/2016 0306   GFRNONAA >60 04/16/2016 0306   GFRAA >60 04/16/2016 0306       Component Value Date/Time   WBC 6.2 04/16/2016 0306   WBC 4.4 04/15/2016 1126   WBC 4.9 04/06/2016 1101   HGB 10.6 (L)  04/16/2016 0306   HGB 10.3 (L) 04/15/2016 1126   HGB 10.0 (L) 04/06/2016 1101   HCT 35.7 (L) 04/16/2016 0306   HCT 34.8 (L) 04/15/2016 1126   HCT 32.4 (L) 04/06/2016 1101   MCV 78.5 04/16/2016 0306   MCV 79.1 04/15/2016 1126   MCV 76.8 (L) 04/06/2016 1101    Lipid Panel     Component Value Date/Time   CHOL 185 04/06/2016 1101   TRIG 121 04/06/2016 1101  HDL 52 04/06/2016 1101   CHOLHDL 3.6 04/06/2016 1101   VLDL 24 04/06/2016 1101   LDLCALC 109 04/06/2016 1101    ABG No results found for: PHART, PCO2ART, PO2ART, HCO3, TCO2, ACIDBASEDEF, O2SAT   Lab Results  Component Value Date   TSH 0.304 (L) 04/15/2016   BNP (last 3 results) No results for input(s): BNP in the last 8760 hours.  ProBNP (last 3 results) No results for input(s): PROBNP in the last 8760 hours.  Cardiac Panel (last 3 results) No results for input(s): CKTOTAL, CKMB, TROPONINI, RELINDX in the last 72 hours.  Iron/TIBC/Ferritin/ %Sat    Component Value Date/Time   FERRITIN 49 07/25/2010 2054     EKG Orders placed or performed during the hospital encounter of 04/15/16  . ED EKG  . ED EKG  . EKG 12-Lead  . EKG 12-Lead  . EKG     Prior Assessment and Plan Problem List as of 04/26/2016 Reviewed: 04/23/2016  8:05 AM by Stefano Gaul, PT     Cardiovascular and Mediastinum   Essential hypertension   Last Assessment & Plan 04/06/2016 Office Visit Written 04/07/2016 12:26 PM by Alycia Rossetti, MD    Controlled no hypotension Due for A1C and fasting labs for hyperlipidemia so will go ahead and obtain today  Goal A1C less than 7%         Digestive   GERD   Last Assessment & Plan 10/27/2015 Office Visit Edited 10/27/2015 11:10 AM by Danie Binder, MD    SYMPTOMS FAIRLY WELL CONTROLLED BUT HAVE NON-ACID OR ATYPICAL ACID REFLUX DUE TO PAIN PERCEPTION BEING IMPAIRED BY DIABETES.  CONTINUE WEIGHT LOSS EFFORTS. AVOID REFLUX TRIGGERS.  HANDOUT GIVEN. CONTINUE OMEPRAZOLE.  TAKE 30 MINUTES PRIOR TO YOUR  FIrST MEAL. MAY NEED TO CHANGE TO DEXILANT, OR ADD BACLOFEN. EXPLAINED TO PT NOT A CANDIDATE FOR REFLUX SURGERY UNLESS BMI < 40. COMPLETE BARIUM SWALLOW next week. UPPER ENDOSCOPY TO stretch YOUR ESOPHAGUS and place the BRAVO CAPSULE IN YOUR ESOPHAGUS IN 2-3 WEEKS. FOLLOW UP IN 4 MOS.         Unspecified constipation   Last Assessment & Plan 11/26/2012 Office Visit Written 11/26/2012  9:59 AM by Mahala Menghini, PA-C    Add MiraLax 17 g daily as needed for chronic constipation. Prior to her bowel prep she will begin 17 g twice a day for 3 days followed by once daily until her bowel prep.      Dysphagia, idiopathic   Last Assessment & Plan 10/27/2015 Office Visit Written 10/27/2015 11:07 AM by Danie Binder, MD    SYMPTOMS NOT CONTROLLED & LIKELY DUE TO UNCONTROLLED GERD, LESS LIKELY PEPTIC STRICTURE OR EG JXN CANCER   CONTINUE YOUR WEIGHT LOSS EFFORTS. AVOID REFLUX TRIGGERS.  HANDOUT GIVEN. CONTINUE OMEPRAZOLE.  TAKE 30 MINUTES PRIOR TO YOUR FIrST MEAL. COMPLETE BARIUM SWALLOW next week. UPPER ENDOSCOPY TO stretch YOUR ESOPHAGUS and place the BRAVO CAPSULE IN YOUR ESOPHAGUS IN 2-3 WEEKS. PHENERGAN 12.5 MG IV IN PREOP. DISCUSSED PROCEDURE, BENEFITS, & RISKS: < 1% chance of medication reaction, PERFORATION, CAPSULE IN LUNG, OR bleeding. FOLLOW UP IN 4 MOS.       Dysphagia     Endocrine   Diabetes mellitus, type II [facility])   Last Assessment & Plan 07/19/2015 Office Visit Written 07/19/2015  2:01 PM by Alycia Rossetti, MD    Check A1C, well controlled, no change to meds      Diabetic neuropathy [facility])   Last Assessment &  Plan 01/18/2015 Office Visit Written 01/18/2015  9:30 AM by Alycia Rossetti, MD    Continue gabapentin,       Hypothyroidism   Last Assessment & Plan 02/10/2013 Office Visit Written 02/10/2013  9:19 AM by Alycia Rossetti, MD     She's followed by endocrinology I did not remember this when I changed her Synthroid to 175 mcg. She is to see him next week as well as repeat  labs for thyroid studies. I will forward a copy  Of her labs and the date of the change      Controlled diabetes mellitus type 2 with complications [facility])     Musculoskeletal and Integument   OSTEOARTHRITIS   Last Assessment & Plan 07/29/2012 Office Visit Written 07/29/2012 10:03 PM by Alycia Rossetti, MD    Likley cause of joint pain      DDD (degenerative disc disease), lumbar   Last Assessment & Plan 06/23/2014 Office Visit Written 06/23/2014  4:21 PM by Alycia Rossetti, MD    F/u with neurosurgeon, pain meds given Use cane for ambulation especially with severe OA both knees       OA (osteoarthritis) of knee     Other   Hyperlipidemia   Last Assessment & Plan 08/14/2013 Office Visit Written 08/16/2013 11:45 AM by Alycia Rossetti, MD    Check FLP, on statin      Leg swelling   Last Assessment & Plan 08/14/2013 Office Visit Edited 08/16/2013 11:44 AM by Alycia Rossetti, MD    Continue lasix , elevate feet       Depression with anxiety   Last Assessment & Plan 01/18/2015 Office Visit Written 01/18/2015  9:32 AM by Alycia Rossetti, MD    Doing well on prozac      [id number])   Last Assessment & Plan 09/15/2012 Office Visit Written 09/15/2012 10:44 AM by Alycia Rossetti, MD    Head injury no focal deficits, takes ASA on regular basis, obtain CT head Vicodin for pain , no concussive symptoms      Head injury, unspecified   Last Assessment & Plan 09/15/2012 Office Visit Written 09/15/2012 10:44 AM by Alycia Rossetti, MD    Occurred 1 week ago, CT head      FH: colon cancer   Last Assessment & Plan 10/27/2015 Office Visit Written 10/27/2015 11:07 AM by Danie Binder, MD    TCS IN 2024-MOTHER HAD COLON CANCER AFTER AGE 59      Chronic anemia   Last Assessment & Plan 02/10/2013 Office Visit Written 02/10/2013  9:17 AM by Alycia Rossetti, MD    Chronic anemia followed by GI our repeat her CBC      Positive for microalbuminuria   Last Assessment & Plan 02/10/2013 Office  Visit Written 02/10/2013  9:16 AM by Alycia Rossetti, MD    She had a positive urine microalbumin that was sent to me from a study she was involved with [facility]. I will have this repeated today she is on an ACE inhibitor her diabetes has been well-controlled blood pressure is well-controlled We may need a renal ultrasound depending on result      Morbid obesity [facility])   Last Assessment & Plan 07/19/2015 Office Visit Written 07/19/2015  2:02 PM by Alycia Rossetti, MD    Discussed how weight loss will help OA      Spinal stenosis, lumbar region, with neurogenic claudication   Last Assessment & Plan 08/14/2013  Office Visit Written 08/16/2013 11:43 AM by Alycia Rossetti, MD    S/p surgeries Pain medication refilled      Knee pain, right   Last Assessment & Plan 12/14/2013 Office Visit Written 12/14/2013  5:34 PM by Alycia Rossetti, MD    Referral to orthopedics for evaluation Continue naprosyn Percocet refill      Atypical chest pain   Last Assessment & Plan 07/19/2015 Office Visit Written 07/19/2015  2:01 PM by Alycia Rossetti, MD    Her chest pain sounds musculoskeletal but she continues to have recurrent episodes despite treatment for muscles and Reflux. She has risk factors for heart disease. Clear she is having some other type of spasm. On medicine or cardiology for evaluation      Angioedema   Dizziness   Bradycardia       Imaging: Ct Head Wo Contrast  Result Date: 04/16/2016 CLINICAL DATA:  Acute onset of severe dizziness.  Initial encounter. EXAM: CT HEAD WITHOUT CONTRAST TECHNIQUE: Contiguous axial images were obtained from the base of the skull through the vertex without intravenous contrast. COMPARISON:  MRI of the brain performed 03/21/2005 FINDINGS: Brain: No evidence of acute infarction, hemorrhage, hydrocephalus, extra-axial collection or mass lesion/mass effect. The posterior fossa, including the cerebellum, brainstem and fourth ventricle, is within normal limits.  The third and lateral ventricles, and basal ganglia are unremarkable in appearance. The cerebral hemispheres are symmetric in appearance, with normal gray-white differentiation. No mass effect or midline shift is seen. Vascular: No hyperdense vessel or unexpected calcification. Skull: There is no evidence of fracture; visualized osseous structures are unremarkable in appearance. Sinuses/Orbits: The visualized portions of the orbits are within normal limits. The paranasal sinuses and mastoid air cells are well-aerated. Other: No significant soft tissue abnormalities are seen. IMPRESSION: Unremarkable noncontrast CT of the head. Electronically Signed   By: Garald Balding M.D.   On: 04/16/2016 00:43   Mr Jeri Cos [id number] Contrast  Result Date: 04/17/2016 CLINICAL DATA:  Evaluation for persistent dizziness. EXAM: MRI HEAD WITHOUT AND WITH CONTRAST TECHNIQUE: Multiplanar, multiecho pulse sequences of the brain and surrounding structures were obtained without and with intravenous contrast. CONTRAST:  36mL MULTIHANCE GADOBENATE DIMEGLUMINE 529 MG/ML IV SOLN COMPARISON:  Prior CT from 04/15/2016. FINDINGS: Brain: Cerebral volume within normal limits for age. No focal parenchymal signal abnormality identified. No abnormal foci of restricted diffusion to suggest acute or subacute ischemia. Gray-white matter differentiation well maintained. No evidence for acute or chronic intracranial hemorrhage. No areas of chronic infarction. No mass lesion, midline shift or mass effect. No hydrocephalus. No extra-axial fluid collection. Major dural sinuses are grossly patent. No abnormal enhancement. Pituitary gland normal. Vascular: Major intracranial vascular flow voids are well preserved. Skull and upper cervical spine: Craniocervical junction normal. Mild degenerative spondylolysis and disc bulge noted at T2-3 without stenosis. Remainder the visualized upper cervical spine unremarkable. Bone marrow signal intensity normal. No scalp soft  tissue abnormality. Sinuses/Orbits: Globes and orbital soft tissues within normal limits. Paranasal sinuses are clear. No mastoid effusion. Inner ear structures grossly normal. Other: No other significant finding. IMPRESSION: Normal brain MRI. Electronically Signed   By: Jeannine Boga M.D.   On: 04/17/2016 02:21

## 2016-04-27 ENCOUNTER — Telehealth: Payer: Self-pay | Admitting: Family Medicine

## 2016-04-27 NOTE — Telephone Encounter (Signed)
Requesting Autumn Carson for follow up visits  Dr Bronson Ing, MD and Arnold Long, NP.   Approved for 6 visits 04/26/16 - 10/23/16  Dx R07.2, I10, E11.40 and E78.5  Fax to office

## 2016-05-03 ENCOUNTER — Ambulatory Visit: Payer: Commercial Managed Care - HMO | Admitting: Rehabilitative and Restorative Service Providers"

## 2016-05-07 ENCOUNTER — Telehealth: Payer: Self-pay | Admitting: Adult Health

## 2016-05-07 NOTE — Telephone Encounter (Signed)
Patient states that [place] is needing clarification on dosage of Hydralazine. / tg

## 2016-05-07 NOTE — Telephone Encounter (Signed)
[employer] cannot cut pills for pt,pt can cut her hydralazine from 10 mg in half  for 5 mg dose,will notify pt

## 2016-05-09 ENCOUNTER — Ambulatory Visit (INDEPENDENT_AMBULATORY_CARE_PROVIDER_SITE_OTHER): Payer: Commercial Managed Care - HMO | Admitting: Family Medicine

## 2016-05-09 ENCOUNTER — Encounter: Payer: Self-pay | Admitting: Family Medicine

## 2016-05-09 VITALS — BP 140/82 | HR 84 | Temp 98.8°F | Resp 14 | Ht 67.0 in | Wt 311.0 lb

## 2016-05-09 DIAGNOSIS — D649 Anemia, unspecified: Secondary | ICD-10-CM

## 2016-05-09 DIAGNOSIS — E782 Mixed hyperlipidemia: Secondary | ICD-10-CM

## 2016-05-09 DIAGNOSIS — I10 Essential (primary) hypertension: Secondary | ICD-10-CM | POA: Diagnosis not present

## 2016-05-09 NOTE — Assessment & Plan Note (Signed)
Chronic anemia stable, has had work up in past

## 2016-05-09 NOTE — Assessment & Plan Note (Signed)
Bp looks okay, a little elevated, but no longer symptomatic Will have her monitor at home, if BP start to run > 140 consistently then increase to 10mg  TID of hydralzine,  Reviewed all labs in past month, no other changes to medications

## 2016-05-09 NOTE — Progress Notes (Signed)
   Subjective:    Patient ID: Autumn Carson, female    DOB: August 08, 1955, 60 y.o.   MRN: [id number]  Patient presents for Hospital F/U (is fasting)  Patient here for hospital follow-up she was seen on her last visit for ongoing vertigo-like symptoms we have tried meclizine and Valium had advised the next step would be imaging of the brain if it did not improve.. She presented to the emergency room due to ongoing symptoms however while she was being evaluated she began having throat tightness and swelling consistent with angioedema the only medication she received in the ER with meclizine and Valium she has been on ACE inhibitor for greater than 10 years. Her ACE inhibitor was discontinued and she was started on hydralazine her MRI of brain and CT were negative She did have cardiac workup while she was inpatient as well which was negative with preserved ejection fraction seems that she had some bradycardia that occurred while she was inpatient and therefore metoprolol was discontinued as well during the hospitalization. No further dizzy episodes no chest pain or shortness of breath  She has seen cardiology as an outpatient she is continued on hydralazine 5 mg every 8 hours  Date on her last from the hospital her TSH was borderline low she has an endocrinologist has been adjusting her dose this resulted a 0.3 she says she spoke with her endocrinologist she saw the recent lab results and told her to continue the current dose   Review Of Systems:  GEN- denies fatigue, fever, weight loss,weakness, recent illness HEENT- denies eye drainage, change in vision, nasal discharge, CVS- denies chest pain, palpitations RESP- denies SOB, cough, wheeze ABD- denies N/V, change in stools, abd pain GU- denies dysuria, hematuria, dribbling, incontinence MSK- denies joint pain, muscle aches, injury Neuro- denies headache, dizziness, syncope, seizure activity       Objective:    BP 140/82 (BP Location:  Left Arm, Patient Position: Sitting, Cuff Size: Large)   Pulse 84   Temp 98.8 F (37.1 C) (Oral)   Resp 14   Ht 5\' 7"  (1.702 m)   Wt (!) 311 lb (141.1 kg)   LMP 09/07/2011   BMI 48.71 kg/m  GEN- NAD, alert and oriented x3 HEENT- PERRL, EOMI, non injected sclera, pink conjunctiva, MMM, oropharynx clear CVS- RRR, no murmur RESP-CTAB EXT- No edema Pulses- Radial, DP- 2+        Assessment & Plan:      Problem List Items Addressed This Visit    Morbid obesity ([place]) - Primary   Hyperlipidemia    Lipids recently checked in Sept, no change to meds      Essential hypertension    Bp looks okay, a little elevated, but no longer symptomatic Will have her monitor at home, if BP start to run > 140 consistently then increase to 10mg  TID of hydralzine,  Reviewed all labs in past month, no other changes to medications      Chronic anemia    Chronic anemia stable, has had work up in past       Other Visit Diagnoses   None.     Note: This dictation was prepared with Dragon dictation along with smaller phrase technology. Any transcriptional errors that result from this process are unintentional.

## 2016-05-09 NOTE — Patient Instructions (Signed)
F/U 4 months  

## 2016-05-09 NOTE — Assessment & Plan Note (Signed)
Lipids recently checked in Sept, no change to meds

## 2016-05-10 ENCOUNTER — Telehealth: Payer: Self-pay | Admitting: Family Medicine

## 2016-05-10 ENCOUNTER — Encounter: Payer: Self-pay | Admitting: Rehabilitative and Restorative Service Providers"

## 2016-05-10 NOTE — Telephone Encounter (Signed)
Has upcoming follow up visits with Dr Gladstone Lighter for her knee pain M25.561, [place] # [id number] for 6 visits 05/14/16 - 11/10/16.

## 2016-05-11 ENCOUNTER — Encounter: Payer: Self-pay | Admitting: Physical Therapy

## 2016-05-14 DIAGNOSIS — M1711 Unilateral primary osteoarthritis, right knee: Secondary | ICD-10-CM | POA: Diagnosis not present

## 2016-05-15 ENCOUNTER — Encounter: Payer: Self-pay | Admitting: Physical Therapy

## 2016-05-17 ENCOUNTER — Encounter: Payer: Self-pay | Admitting: Physical Therapy

## 2016-05-22 ENCOUNTER — Encounter: Payer: Self-pay | Admitting: Physical Therapy

## 2016-05-25 ENCOUNTER — Encounter: Payer: Self-pay | Admitting: Physical Therapy

## 2016-06-20 ENCOUNTER — Other Ambulatory Visit: Payer: Self-pay | Admitting: Family Medicine

## 2016-07-10 ENCOUNTER — Other Ambulatory Visit: Payer: Self-pay | Admitting: Family Medicine

## 2016-07-12 ENCOUNTER — Telehealth: Payer: Self-pay | Admitting: *Deleted

## 2016-07-12 MED ORDER — SIMVASTATIN 40 MG PO TABS: 40.0000 mg | ORAL_TABLET | Freq: Every day | ORAL | 3 refills | Status: DC

## 2016-07-12 NOTE — Telephone Encounter (Signed)
Received fax requesting refill on Zocor.   Refill appropriate and filled per protocol. 

## 2016-07-26 LAB — HEMOGLOBIN A1C: HEMOGLOBIN A1C: 6.2

## 2016-08-10 DIAGNOSIS — M170 Bilateral primary osteoarthritis of knee: Secondary | ICD-10-CM | POA: Diagnosis not present

## 2016-09-05 ENCOUNTER — Encounter: Payer: Self-pay | Admitting: Physician Assistant

## 2016-09-05 ENCOUNTER — Ambulatory Visit (INDEPENDENT_AMBULATORY_CARE_PROVIDER_SITE_OTHER): Payer: Medicare HMO | Admitting: Physician Assistant

## 2016-09-05 VITALS — BP 190/116 | HR 75 | Temp 98.0°F | Resp 16 | Wt 311.0 lb

## 2016-09-05 DIAGNOSIS — I10 Essential (primary) hypertension: Secondary | ICD-10-CM

## 2016-09-05 MED ORDER — HYDRALAZINE HCL 10 MG PO TABS
10.0000 mg | ORAL_TABLET | Freq: Three times a day (TID) | ORAL | 3 refills | Status: DC
Start: 2016-09-05 — End: 2016-11-28

## 2016-09-05 NOTE — Progress Notes (Signed)
Patient ID: Autumn Carson MRN: [id number], DOB: 1956-07-07, 61 y.o. Date of Encounter: 09/05/2016, 1:28 PM    Chief Complaint:  Chief Complaint  Patient presents with  . Headache    x2wks/more severe today/ blood pressure has been up     HPI: 61 y.o. year old female presents with above.   I reviewed Dr. Dorian Heckle last office note 05/09/16.  At that visit patient's blood pressure was controlled. Did note that if blood pressure started to increase, then would increase hydralazine to 10 mg 3 times a day.  Today patient states that she has been checking her blood pressure on a routine basis. Says that the readings would be up and down. Sometimes would get high readings like 202/117. At other times would get lower readings. Says that she doesn't usually have a headache but has been having headache for the past couple of weeks. No chest discomfort. No chest pressure heaviness or tightness or squeezing.  Has had no symptoms of TIA or CVA. Says that she has been doing better with her diet--however, does eat a lot of bread. Says that she has not been eating soups or any canned foods. Says she has had increased stress --- that her husband left and also she does worry about her daughter.     Home Meds:   Outpatient Medications Prior to Visit  Medication Sig Dispense Refill  . albuterol (PROVENTIL HFA;VENTOLIN HFA) 108 (90 BASE) MCG/ACT inhaler Inhale 2 puffs into the lungs every 6 (six) hours as needed for wheezing. 1 Inhaler 2  . aspirin 81 MG tablet Take 81 mg by mouth every other day.    . cyclobenzaprine (FLEXERIL) 10 MG tablet TAKE 1 TABLET BY MOUTH EVERY 8 HOURS AS NEEDED FOR MUSCLE SPASMS (Patient taking differently: Take 10 mg by mouth every 8 (eight) hours as needed for muscle spasms. ) 45 tablet 2  . diazepam (VALIUM) 5 MG tablet Take 1 tablet (5 mg total) by mouth every 12 (twelve) hours as needed for anxiety. 20 tablet 0  . FLUoxetine (PROZAC) 40 MG capsule TAKE 1 CAPSULE  EVERY DAY BEFORE BREAKFAST (REQUIRE OFFICE VISIT BEFORE ANY FURTHER REFILLS CAN BE GIVEN) 90 capsule 0  . fluticasone (FLONASE) 50 MCG/ACT nasal spray PLACE 2 SPRAYS INTO BOTH NOSTRILS DAILY. (Patient taking differently: PLACE 2 SPRAYS INTO BOTH NOSTRILS every evening) 48 g 3  . gabapentin (NEURONTIN) 300 MG capsule TAKE 1 CAPSULE TWICE DAILY 180 capsule 1  . metFORMIN (GLUCOPHAGE) 1000 MG tablet TAKE 1 TABLET TWICE DAILY EVERY MORNING  AND EVERY EVENING 180 tablet 1  . naproxen (NAPROSYN) 500 MG tablet TAKE 1 TABLET TWICE DAILY WITH MEALS 180 tablet 1  . omeprazole (PRILOSEC) 40 MG capsule 1 PO 30 MINS PRIOR TO MEALS BID (Patient taking differently: Take 40 mg by mouth 2 (two) times daily. 30 MINS PRIOR TO MEALS) 180 capsule 3  . promethazine (PHENERGAN) 12.5 MG tablet Take 1 tablet (12.5 mg total) by mouth every 8 (eight) hours as needed for nausea or vomiting. 20 tablet 0  . simvastatin (ZOCOR) 40 MG tablet Take 1 tablet (40 mg total) by mouth daily. 90 tablet 3  . SYNTHROID 125 MCG tablet Take 250 mcg by mouth daily before breakfast.     . zolpidem (AMBIEN) 5 MG tablet TAKE 1 TABLET BY MOUTH EVERY NIGHT AT BEDTIME AS NEEDED FOR SLEEP (Patient taking differently: TAKE 1 TABLET (5 mg) BY MOUTH EVERY NIGHT AT BEDTIME AS NEEDED FOR SLEEP) 15 tablet  3  . hydrALAZINE (APRESOLINE) 10 MG tablet Take 0.5 tablets (5 mg total) by mouth every 8 (eight) hours. 45 tablet 3   No facility-administered medications prior to visit.     Allergies:  Allergies  Allergen Reactions  . Grapefruit Oil Anaphylaxis    Grape fruit causes Anaphylaxis  . Lisinopril Anaphylaxis    angioedema      Review of Systems: See HPI for pertinent ROS. All other ROS negative.    Physical Exam: Blood pressure (!) 190/116, pulse 75, temperature 98 F (36.7 C), temperature source Oral, resp. rate 16, weight (!) 311 lb (141.1 kg), last menstrual period 09/07/2011, SpO2 99 %., Body mass index is 48.71 kg/m. General:  Obese AAF.  Appears in no acute distress. Neck: Supple. No thyromegaly. No lymphadenopathy. Lungs: Clear bilaterally to auscultation without wheezes, rales, or rhonchi. Breathing is unlabored. Heart: Regular rhythm. No murmurs, rubs, or gallops. Msk:  Strength and tone normal for age. Extremities/Skin: Warm and dry. Neuro: Alert and oriented X 3. Moves all extremities spontaneously. Gait is normal. CNII-XII grossly in tact. Psych:  Responds to questions appropriately with a normal affect.     ASSESSMENT AND PLAN:  61 y.o. year old female with  1. Essential hypertension She has been taking 1/2 TID----So far today, she has only taken morning dose. She is to go home and take whole tablet now as her mid-day dose and A whole tablet again tonight. Take whole tablet 3 times a day and then continue this dose. To schedule follow-up visit here in one week with either me or Dr. Buelah Manis. She is to bring in her home blood pressure readings with her to that visit. - hydrALAZINE (APRESOLINE) 10 MG tablet; Take 1 tablet (10 mg total) by mouth 3 (three) times daily.  Dispense: 90 tablet; Refill: 3   Signed, [address] [place], [place], [facility] 09/05/2016 1:28 PM

## 2016-09-12 ENCOUNTER — Encounter: Payer: Self-pay | Admitting: Physician Assistant

## 2016-09-12 ENCOUNTER — Ambulatory Visit (INDEPENDENT_AMBULATORY_CARE_PROVIDER_SITE_OTHER): Payer: Medicare HMO | Admitting: Physician Assistant

## 2016-09-12 VITALS — BP 150/102 | HR 72 | Temp 98.4°F | Resp 18 | Wt 311.6 lb

## 2016-09-12 DIAGNOSIS — E118 Type 2 diabetes mellitus with unspecified complications: Secondary | ICD-10-CM

## 2016-09-12 DIAGNOSIS — I10 Essential (primary) hypertension: Secondary | ICD-10-CM

## 2016-09-12 MED ORDER — AMLODIPINE BESYLATE 5 MG PO TABS: ORAL_TABLET | ORAL | 3 refills | Status: DC

## 2016-09-12 NOTE — Progress Notes (Signed)
Patient ID: Autumn Carson MRN: [id number], DOB: 1955-09-27, 61 y.o. Date of Encounter: 09/12/2016, 9:11 AM    Chief Complaint:  Chief Complaint  Patient presents with  . Hypertension    1 wk f/u     HPI: 61 y.o. year old female presents with above.    09/05/2016: I reviewed Dr. Dorian Heckle last office note 05/09/16.  At that visit patient's blood pressure was controlled. Did note that if blood pressure started to increase, then would increase hydralazine to 10 mg 3 times a day.  Today patient states that she has been checking her blood pressure on a routine basis. Says that the readings would be up and down. Sometimes would get high readings like 202/117. At other times would get lower readings. Says that she doesn't usually have a headache but has been having headache for the past couple of weeks. No chest discomfort. No chest pressure heaviness or tightness or squeezing.  Has had no symptoms of TIA or CVA. Says that she has been doing better with her diet--however, does eat a lot of bread. Says that she has not been eating soups or any canned foods. Says she has had increased stress --- that her husband left and also she does worry about her daughter.   Essential hypertension She has been taking 1/2 TID----So far today, she has only taken morning dose. She is to go home and take whole tablet now as her mid-day dose and A whole tablet again tonight. Take whole tablet 3 times a day and then continue this dose. To schedule follow-up visit here in one week with either me or Dr. Buelah Manis. She is to bring in her home blood pressure readings with her to that visit. - hydrALAZINE (APRESOLINE) 10 MG tablet; Take 1 tablet (10 mg total) by mouth 3 (three) times daily.  Dispense: 90 tablet; Refill: 3   09/12/2016: Today she reports that she has been taking the whole hydralazine 3 times daily as directed. She has continued to check her blood pressure at home and brings in her meter for me to  review the memory. Still getting high readings. Blood pressure reading here is 150/102. She has no other complaints or concerns today.   Home Meds:   Outpatient Medications Prior to Visit  Medication Sig Dispense Refill  . albuterol (PROVENTIL HFA;VENTOLIN HFA) 108 (90 BASE) MCG/ACT inhaler Inhale 2 puffs into the lungs every 6 (six) hours as needed for wheezing. 1 Inhaler 2  . aspirin 81 MG tablet Take 81 mg by mouth every other day.    . cyclobenzaprine (FLEXERIL) 10 MG tablet TAKE 1 TABLET BY MOUTH EVERY 8 HOURS AS NEEDED FOR MUSCLE SPASMS (Patient taking differently: Take 10 mg by mouth every 8 (eight) hours as needed for muscle spasms. ) 45 tablet 2  . diazepam (VALIUM) 5 MG tablet Take 1 tablet (5 mg total) by mouth every 12 (twelve) hours as needed for anxiety. 20 tablet 0  . FLUoxetine (PROZAC) 40 MG capsule TAKE 1 CAPSULE EVERY DAY BEFORE BREAKFAST (REQUIRE OFFICE VISIT BEFORE ANY FURTHER REFILLS CAN BE GIVEN) 90 capsule 0  . fluticasone (FLONASE) 50 MCG/ACT nasal spray PLACE 2 SPRAYS INTO BOTH NOSTRILS DAILY. (Patient taking differently: PLACE 2 SPRAYS INTO BOTH NOSTRILS every evening) 48 g 3  . gabapentin (NEURONTIN) 300 MG capsule TAKE 1 CAPSULE TWICE DAILY 180 capsule 1  . hydrALAZINE (APRESOLINE) 10 MG tablet Take 1 tablet (10 mg total) by mouth 3 (three) times daily. 90 tablet  3  . metFORMIN (GLUCOPHAGE) 1000 MG tablet TAKE 1 TABLET TWICE DAILY EVERY MORNING  AND EVERY EVENING 180 tablet 1  . naproxen (NAPROSYN) 500 MG tablet TAKE 1 TABLET TWICE DAILY WITH MEALS 180 tablet 1  . omeprazole (PRILOSEC) 40 MG capsule 1 PO 30 MINS PRIOR TO MEALS BID (Patient taking differently: Take 40 mg by mouth 2 (two) times daily. 30 MINS PRIOR TO MEALS) 180 capsule 3  . promethazine (PHENERGAN) 12.5 MG tablet Take 1 tablet (12.5 mg total) by mouth every 8 (eight) hours as needed for nausea or vomiting. 20 tablet 0  . simvastatin (ZOCOR) 40 MG tablet Take 1 tablet (40 mg total) by mouth daily. 90  tablet 3  . SYNTHROID 125 MCG tablet Take 250 mcg by mouth daily before breakfast.     . zolpidem (AMBIEN) 5 MG tablet TAKE 1 TABLET BY MOUTH EVERY NIGHT AT BEDTIME AS NEEDED FOR SLEEP (Patient taking differently: TAKE 1 TABLET (5 mg) BY MOUTH EVERY NIGHT AT BEDTIME AS NEEDED FOR SLEEP) 15 tablet 3   No facility-administered medications prior to visit.     Allergies:  Allergies  Allergen Reactions  . Grapefruit Oil Anaphylaxis    Grape fruit causes Anaphylaxis  . Lisinopril Anaphylaxis    angioedema      Review of Systems: See HPI for pertinent ROS. All other ROS negative.    Physical Exam: Blood pressure (!) 150/102, pulse 72, temperature 98.4 F (36.9 C), temperature source Oral, resp. rate 18, weight (!) 311 lb 9.6 oz (141.3 kg), last menstrual period 09/07/2011, SpO2 98 %., Body mass index is 48.8 kg/m. General:  Obese AAF. Appears in no acute distress. Neck: Supple. No thyromegaly. No lymphadenopathy. Lungs: Clear bilaterally to auscultation without wheezes, rales, or rhonchi. Breathing is unlabored. Heart: Regular rhythm. No murmurs, rubs, or gallops. Msk:  Strength and tone normal for age. Extremities/Skin: Warm and dry. Neuro: Alert and oriented X 3. Moves all extremities spontaneously. Gait is normal. CNII-XII grossly in tact. Psych:  Responds to questions appropriately with a normal affect.     ASSESSMENT AND PLAN:  61 y.o. year old female with   1. Essential hypertension Pressure is uncontrolled. 2 new current dose of hydralazine. I reviewed her comorbidities to help in deciding additional blood pressure medication to add. Given her diabetes I was looking at Ace inhibitors or arms but she has had anaphylaxis with Ace so definitely will not use that and also will avoid ARB. At this time will add Norvasc 5 mg. She is to take 1 tablet daily for 2 days and then start taking 2 tablets daily. She is to schedule follow-up visit here in about 10 days---- prior to needing  refill on that medicine. Follow up sooner if needed. - amLODipine (NORVASC) 5 MG tablet; Take one daily for 2 days then go up to 2 daily.  Dispense: 30 tablet; Refill: 3  2. Controlled type 2 diabetes mellitus with complication, without long-term current use of insulin [facility])     Signed, [address] [place], [place], [facility] 09/12/2016 9:11 AM

## 2016-09-21 DIAGNOSIS — M170 Bilateral primary osteoarthritis of knee: Secondary | ICD-10-CM | POA: Diagnosis not present

## 2016-09-24 ENCOUNTER — Ambulatory Visit (INDEPENDENT_AMBULATORY_CARE_PROVIDER_SITE_OTHER): Payer: Medicare HMO | Admitting: Physician Assistant

## 2016-09-24 ENCOUNTER — Encounter: Payer: Self-pay | Admitting: Physician Assistant

## 2016-09-24 VITALS — BP 160/90 | HR 78 | Temp 98.4°F | Resp 18 | Wt 307.0 lb

## 2016-09-24 DIAGNOSIS — I10 Essential (primary) hypertension: Secondary | ICD-10-CM

## 2016-09-24 MED ORDER — NEBIVOLOL HCL 5 MG PO TABS: 5.0000 mg | ORAL_TABLET | Freq: Every day | ORAL | 3 refills | Status: DC

## 2016-09-24 MED ORDER — AMLODIPINE BESYLATE 10 MG PO TABS: 10.0000 mg | ORAL_TABLET | Freq: Every day | ORAL | 3 refills | Status: DC

## 2016-09-24 NOTE — Progress Notes (Signed)
Patient ID: Autumn Carson MRN: [id number], DOB: Sep 12, 1955, 61 y.o. Date of Encounter: 09/24/2016, 9:30 AM    Chief Complaint:  Chief Complaint  Patient presents with  . Hypertension    f/u     HPI: 61 y.o. year old female presents with above.    09/05/2016: I reviewed Dr. Dorian Heckle last office note 05/09/16.  At that visit patient's blood pressure was controlled. Did note that if blood pressure started to increase, then would increase hydralazine to 10 mg 3 times a day.  Today patient states that she has been checking her blood pressure on a routine basis. Says that the readings would be up and down. Sometimes would get high readings like 202/117. At other times would get lower readings. Says that she doesn't usually have a headache but has been having headache for the past couple of weeks. No chest discomfort. No chest pressure heaviness or tightness or squeezing.  Has had no symptoms of TIA or CVA. Says that she has been doing better with her diet--however, does eat a lot of bread. Says that she has not been eating soups or any canned foods. Says she has had increased stress --- that her husband left and also she does worry about her daughter.   Essential hypertension She has been taking 1/2 TID----So far today, she has only taken morning dose. She is to go home and take whole tablet now as her mid-day dose and A whole tablet again tonight. Take whole tablet 3 times a day and then continue this dose. To schedule follow-up visit here in one week with either me or Dr. Buelah Manis. She is to bring in her home blood pressure readings with her to that visit. - hydrALAZINE (APRESOLINE) 10 MG tablet; Take 1 tablet (10 mg total) by mouth 3 (three) times daily.  Dispense: 90 tablet; Refill: 3   09/12/2016: Today she reports that she has been taking the whole hydralazine 3 times daily as directed. She has continued to check her blood pressure at home and brings in her meter for me to  review the memory. Still getting high readings. Blood pressure reading here is 150/102. She has no other complaints or concerns today.  1. Essential hypertension Blood Pressure is uncontrolled. Continue current dose of hydralazine. I reviewed her comorbidities to help in deciding additional blood pressure medication to add. Given her diabetes I was looking at Ace inhibitors or arms but she has had anaphylaxis with Ace so definitely will not use that and also will avoid ARB. At this time will add Norvasc 5 mg. She is to take 1 tablet daily for 2 days and then start taking 2 tablets daily. She is to schedule follow-up visit here in about 10 days---- prior to needing refill on that medicine. Follow up sooner if needed. - amLODipine (NORVASC) 5 MG tablet; Take one daily for 2 days then go up to 2 daily.  Dispense: 30 tablet; Refill: 3    09/24/2016: Reports that she is taking the Norvasc as directed. Took one a day for couple of days and then has increased to taking 2 daily. This is causing no adverse effects. Taking the hydralazine 1 whole tablet 3 times daily. Has been checking her blood pressure some. Says that the diastolic was [id number] and has come down to 90 and lowest number she's gotten is 89 there. Says last night she did get reading 140/90 which is one of the lowest readings she has had.  Home Meds:   Outpatient Medications Prior to Visit  Medication Sig Dispense Refill  . albuterol (PROVENTIL HFA;VENTOLIN HFA) 108 (90 BASE) MCG/ACT inhaler Inhale 2 puffs into the lungs every 6 (six) hours as needed for wheezing. 1 Inhaler 2  . amLODipine (NORVASC) 5 MG tablet Take one daily for 2 days then go up to 2 daily. 30 tablet 3  . aspirin 81 MG tablet Take 81 mg by mouth every other day.    . cyclobenzaprine (FLEXERIL) 10 MG tablet TAKE 1 TABLET BY MOUTH EVERY 8 HOURS AS NEEDED FOR MUSCLE SPASMS (Patient taking differently: Take 10 mg by mouth every 8 (eight) hours as needed for muscle spasms.  ) 45 tablet 2  . diazepam (VALIUM) 5 MG tablet Take 1 tablet (5 mg total) by mouth every 12 (twelve) hours as needed for anxiety. 20 tablet 0  . FLUoxetine (PROZAC) 40 MG capsule TAKE 1 CAPSULE EVERY DAY BEFORE BREAKFAST (REQUIRE OFFICE VISIT BEFORE ANY FURTHER REFILLS CAN BE GIVEN) 90 capsule 0  . fluticasone (FLONASE) 50 MCG/ACT nasal spray PLACE 2 SPRAYS INTO BOTH NOSTRILS DAILY. (Patient taking differently: PLACE 2 SPRAYS INTO BOTH NOSTRILS every evening) 48 g 3  . gabapentin (NEURONTIN) 300 MG capsule TAKE 1 CAPSULE TWICE DAILY 180 capsule 1  . hydrALAZINE (APRESOLINE) 10 MG tablet Take 1 tablet (10 mg total) by mouth 3 (three) times daily. 90 tablet 3  . metFORMIN (GLUCOPHAGE) 1000 MG tablet TAKE 1 TABLET TWICE DAILY EVERY MORNING  AND EVERY EVENING 180 tablet 1  . naproxen (NAPROSYN) 500 MG tablet TAKE 1 TABLET TWICE DAILY WITH MEALS 180 tablet 1  . omeprazole (PRILOSEC) 40 MG capsule 1 PO 30 MINS PRIOR TO MEALS BID (Patient taking differently: Take 40 mg by mouth 2 (two) times daily. 30 MINS PRIOR TO MEALS) 180 capsule 3  . promethazine (PHENERGAN) 12.5 MG tablet Take 1 tablet (12.5 mg total) by mouth every 8 (eight) hours as needed for nausea or vomiting. 20 tablet 0  . simvastatin (ZOCOR) 40 MG tablet Take 1 tablet (40 mg total) by mouth daily. 90 tablet 3  . SYNTHROID 125 MCG tablet Take 250 mcg by mouth daily before breakfast.     . zolpidem (AMBIEN) 5 MG tablet TAKE 1 TABLET BY MOUTH EVERY NIGHT AT BEDTIME AS NEEDED FOR SLEEP (Patient taking differently: TAKE 1 TABLET (5 mg) BY MOUTH EVERY NIGHT AT BEDTIME AS NEEDED FOR SLEEP) 15 tablet 3   No facility-administered medications prior to visit.     Allergies:  Allergies  Allergen Reactions  . Grapefruit Oil Anaphylaxis    Grape fruit causes Anaphylaxis  . Lisinopril Anaphylaxis    angioedema      Review of Systems: See HPI for pertinent ROS. All other ROS negative.    Physical Exam: Blood pressure (!) 160/90, pulse 78,  temperature 98.4 F (36.9 C), temperature source Oral, resp. rate 18, weight (!) 307 lb (139.3 kg), last menstrual period 09/07/2011, SpO2 98 %., Body mass index is 48.08 kg/m. General:  Obese AAF. Appears in no acute distress. Neck: Supple. No thyromegaly. No lymphadenopathy. Lungs: Clear bilaterally to auscultation without wheezes, rales, or rhonchi. Breathing is unlabored. Heart: Regular rhythm. No murmurs, rubs, or gallops. Msk:  Strength and tone normal for age. Extremities/Skin: Warm and dry. Neuro: Alert and oriented X 3. Moves all extremities spontaneously. Gait is normal. CNII-XII grossly in tact. Psych:  Responds to questions appropriately with a normal affect.     ASSESSMENT AND PLAN:  61  y.o. year old female with   1. Essential hypertension Blood Pressure is still suboptimal. Has history of angioedema with lisinopril so will avoid ACE inhibitors and also ARB's just to be on the safe side. At this time I have changed the Norvasc 5 mg 2 daily to a 10 mg tablet once daily and sent this to her [facility]. Told her to make sure when she gets this new prescription to realize this is the 10 mg dose and she will decrease to taking just 1 daily. Continue hydralazine at 1 whole tablet 3 times daily. At this time will add Bystolic 5 mg daily. Was sent to local pharmacy. F/U office visit in 3 weeks.  2. Controlled type 2 diabetes mellitus with complication, without long-term current use of insulin [facility])     Signed, [address] [place], [place], [facility] 09/24/2016 9:30 AM

## 2016-09-27 DIAGNOSIS — M170 Bilateral primary osteoarthritis of knee: Secondary | ICD-10-CM | POA: Diagnosis not present

## 2016-09-28 DIAGNOSIS — E119 Type 2 diabetes mellitus without complications: Secondary | ICD-10-CM | POA: Diagnosis not present

## 2016-09-28 LAB — HM DIABETES EYE EXAM

## 2016-10-01 ENCOUNTER — Encounter: Payer: Self-pay | Admitting: Family Medicine

## 2016-10-05 DIAGNOSIS — M170 Bilateral primary osteoarthritis of knee: Secondary | ICD-10-CM | POA: Diagnosis not present

## 2016-10-12 ENCOUNTER — Encounter: Payer: Self-pay | Admitting: Family Medicine

## 2016-10-15 ENCOUNTER — Ambulatory Visit (INDEPENDENT_AMBULATORY_CARE_PROVIDER_SITE_OTHER): Payer: Medicare HMO | Admitting: Physician Assistant

## 2016-10-15 VITALS — BP 140/78 | HR 59 | Temp 98.1°F | Resp 18 | Wt 312.2 lb

## 2016-10-15 DIAGNOSIS — I10 Essential (primary) hypertension: Secondary | ICD-10-CM

## 2016-10-15 MED ORDER — NEBIVOLOL HCL 10 MG PO TABS: 10.0000 mg | ORAL_TABLET | Freq: Every day | ORAL | 3 refills | Status: DC

## 2016-10-15 NOTE — Progress Notes (Signed)
Patient ID: Autumn Carson MRN: [id number], DOB: [birth year], 61 y.o. Date of Encounter: 10/15/2016, 8:38 AM    Chief Complaint:  Chief Complaint  Patient presents with  . Hypertension     HPI: 61 y.o. year old female presents with above.    09/05/2016: I reviewed Dr. Dorian Heckle last office note 05/09/16.  At that visit patient's blood pressure was controlled. Did note that if blood pressure started to increase, then would increase hydralazine to 10 mg 3 times a day.  Today patient states that she has been checking her blood pressure on a routine basis. Says that the readings would be up and down. Sometimes would get high readings like 202/117. At other times would get lower readings. Says that she doesn't usually have a headache but has been having headache for the past couple of weeks. No chest discomfort. No chest pressure heaviness or tightness or squeezing.  Has had no symptoms of TIA or CVA. Says that she has been doing better with her diet--however, does eat a lot of bread. Says that she has not been eating soups or any canned foods. Says she has had increased stress --- that her husband left and also she does worry about her daughter.   Essential hypertension She has been taking 1/2 TID----So far today, she has only taken morning dose. She is to go home and take whole tablet now as her mid-day dose and A whole tablet again tonight. Take whole tablet 3 times a day and then continue this dose. To schedule follow-up visit here in one week with either me or Dr. Buelah Manis. She is to bring in her home blood pressure readings with her to that visit. - hydrALAZINE (APRESOLINE) 10 MG tablet; Take 1 tablet (10 mg total) by mouth 3 (three) times daily.  Dispense: 90 tablet; Refill: 3   09/12/2016: Today she reports that she has been taking the whole hydralazine 3 times daily as directed. She has continued to check her blood pressure at home and brings in her meter for me to review the  memory. Still getting high readings. Blood pressure reading here is 150/102. She has no other complaints or concerns today.  1. Essential hypertension Blood Pressure is uncontrolled. Continue current dose of hydralazine. I reviewed her comorbidities to help in deciding additional blood pressure medication to add. Given her diabetes I was looking at Ace inhibitors or arms but she has had anaphylaxis with Ace so definitely will not use that and also will avoid ARB. At this time will add Norvasc 5 mg. She is to take 1 tablet daily for 2 days and then start taking 2 tablets daily. She is to schedule follow-up visit here in about 10 days---- prior to needing refill on that medicine. Follow up sooner if needed. - amLODipine (NORVASC) 5 MG tablet; Take one daily for 2 days then go up to 2 daily.  Dispense: 30 tablet; Refill: 3    09/24/2016: Reports that she is taking the Norvasc as directed. Took one a day for couple of days and then has increased to taking 2 daily. This is causing no adverse effects. Taking the hydralazine 1 whole tablet 3 times daily. Has been checking her blood pressure some. Says that the diastolic was 025 and has come down to 90 and lowest number she's gotten is 89 there. Says last night she did get reading 140/90 which is one of the lowest readings she has had.  At that visit added: Bystolic 5mg  QD  10/15/2016: She states sh ei staking the [employer] daily. Says it was not expensive. She is having no adverse effects. She forgot the paper where she  Was writing down her BP readings from home but says she knows this morning she got 148/78 and other readings around 153/80.    Home Meds:   Outpatient Medications Prior to Visit  Medication Sig Dispense Refill  . albuterol (PROVENTIL HFA;VENTOLIN HFA) 108 (90 BASE) MCG/ACT inhaler Inhale 2 puffs into the lungs every 6 (six) hours as needed for wheezing. 1 Inhaler 2  . amLODipine (NORVASC) 10 MG tablet Take 1 tablet (10 mg total)  by mouth daily. 90 tablet 3  . aspirin 81 MG tablet Take 81 mg by mouth every other day.    . cyclobenzaprine (FLEXERIL) 10 MG tablet TAKE 1 TABLET BY MOUTH EVERY 8 HOURS AS NEEDED FOR MUSCLE SPASMS (Patient taking differently: Take 10 mg by mouth every 8 (eight) hours as needed for muscle spasms. ) 45 tablet 2  . diazepam (VALIUM) 5 MG tablet Take 1 tablet (5 mg total) by mouth every 12 (twelve) hours as needed for anxiety. 20 tablet 0  . FLUoxetine (PROZAC) 40 MG capsule TAKE 1 CAPSULE EVERY DAY BEFORE BREAKFAST (REQUIRE OFFICE VISIT BEFORE ANY FURTHER REFILLS CAN BE GIVEN) 90 capsule 0  . fluticasone (FLONASE) 50 MCG/ACT nasal spray PLACE 2 SPRAYS INTO BOTH NOSTRILS DAILY. (Patient taking differently: PLACE 2 SPRAYS INTO BOTH NOSTRILS every evening) 48 g 3  . gabapentin (NEURONTIN) 300 MG capsule TAKE 1 CAPSULE TWICE DAILY 180 capsule 1  . hydrALAZINE (APRESOLINE) 10 MG tablet Take 1 tablet (10 mg total) by mouth 3 (three) times daily. 90 tablet 3  . metFORMIN (GLUCOPHAGE) 1000 MG tablet TAKE 1 TABLET TWICE DAILY EVERY MORNING  AND EVERY EVENING 180 tablet 1  . naproxen (NAPROSYN) 500 MG tablet TAKE 1 TABLET TWICE DAILY WITH MEALS 180 tablet 1  . nebivolol (BYSTOLIC) 5 MG tablet Take 1 tablet (5 mg total) by mouth daily. 30 tablet 3  . omeprazole (PRILOSEC) 40 MG capsule 1 PO 30 MINS PRIOR TO MEALS BID (Patient taking differently: Take 40 mg by mouth 2 (two) times daily. 30 MINS PRIOR TO MEALS) 180 capsule 3  . promethazine (PHENERGAN) 12.5 MG tablet Take 1 tablet (12.5 mg total) by mouth every 8 (eight) hours as needed for nausea or vomiting. 20 tablet 0  . simvastatin (ZOCOR) 40 MG tablet Take 1 tablet (40 mg total) by mouth daily. 90 tablet 3  . SYNTHROID 125 MCG tablet Take 250 mcg by mouth daily before breakfast.     . zolpidem (AMBIEN) 5 MG tablet TAKE 1 TABLET BY MOUTH EVERY NIGHT AT BEDTIME AS NEEDED FOR SLEEP (Patient taking differently: TAKE 1 TABLET (5 mg) BY MOUTH EVERY NIGHT AT BEDTIME  AS NEEDED FOR SLEEP) 15 tablet 3   No facility-administered medications prior to visit.     Allergies:  Allergies  Allergen Reactions  . Grapefruit Oil Anaphylaxis    Grape fruit causes Anaphylaxis  . Lisinopril Anaphylaxis    angioedema      Review of Systems: See HPI for pertinent ROS. All other ROS negative.    Physical Exam: Blood pressure 140/78, pulse (!) 59, temperature 98.1 F (36.7 C), resp. rate 18, weight (!) 312 lb 3.2 oz (141.6 kg), last menstrual period 09/07/2011, SpO2 97 %., Body mass index is 48.9 kg/m. General:  Obese AAF. Appears in no acute distress. Neck: Supple. No thyromegaly. No lymphadenopathy. Lungs: Clear  bilaterally to auscultation without wheezes, rales, or rhonchi. Breathing is unlabored. Heart: Regular rhythm. No murmurs, rubs, or gallops. Msk:  Strength and tone normal for age. Extremities/Skin: Warm and dry. Neuro: Alert and oriented X 3. Moves all extremities spontaneously. Gait is normal. CNII-XII grossly in tact. Psych:  Responds to questions appropriately with a normal affect.     ASSESSMENT AND PLAN:  61 y.o. year old female with   1. Essential hypertension Blood Pressure is still suboptimal. Has history of angioedema with lisinopril so will avoid ACE inhibitors and also ARB's just to be on the safe side. Continue Norvasc 10mg  QD. Continue hydralazine at 1 whole tablet 3 times daily. At this time will increase Bystolic to 10 mg daily. Was sent to local pharmacy. F/U office visit in 3 weeks. Now that BP getting controled, will have her schedule next OV with Dr. Buelah Manis to get back on track for ROV with Dr. Buelah Manis. LOV with Dr. Buelah Manis was in October.   2. Controlled type 2 diabetes mellitus with complication, without long-term current use of insulin [facility])     Signed, [address] [place], [place], [facility] 10/15/2016 8:38 AM

## 2016-10-18 ENCOUNTER — Encounter: Payer: Self-pay | Admitting: Family Medicine

## 2016-11-08 DIAGNOSIS — Z85850 Personal history of malignant neoplasm of thyroid: Secondary | ICD-10-CM | POA: Diagnosis not present

## 2016-11-08 DIAGNOSIS — E890 Postprocedural hypothyroidism: Secondary | ICD-10-CM | POA: Diagnosis not present

## 2016-11-12 ENCOUNTER — Ambulatory Visit: Payer: Medicare HMO | Admitting: Family Medicine

## 2016-11-12 DIAGNOSIS — E890 Postprocedural hypothyroidism: Secondary | ICD-10-CM | POA: Diagnosis not present

## 2016-11-12 DIAGNOSIS — Z85850 Personal history of malignant neoplasm of thyroid: Secondary | ICD-10-CM | POA: Diagnosis not present

## 2016-11-12 DIAGNOSIS — E119 Type 2 diabetes mellitus without complications: Secondary | ICD-10-CM | POA: Diagnosis not present

## 2016-11-14 ENCOUNTER — Ambulatory Visit: Payer: Medicare HMO | Admitting: Family Medicine

## 2016-11-21 ENCOUNTER — Encounter: Payer: Self-pay | Admitting: Family Medicine

## 2016-11-21 ENCOUNTER — Ambulatory Visit (INDEPENDENT_AMBULATORY_CARE_PROVIDER_SITE_OTHER): Payer: Medicare HMO | Admitting: Family Medicine

## 2016-11-21 DIAGNOSIS — E782 Mixed hyperlipidemia: Secondary | ICD-10-CM | POA: Diagnosis not present

## 2016-11-21 DIAGNOSIS — F418 Other specified anxiety disorders: Secondary | ICD-10-CM | POA: Diagnosis not present

## 2016-11-21 DIAGNOSIS — E1143 Type 2 diabetes mellitus with diabetic autonomic (poly)neuropathy: Secondary | ICD-10-CM

## 2016-11-21 DIAGNOSIS — I10 Essential (primary) hypertension: Secondary | ICD-10-CM | POA: Diagnosis not present

## 2016-11-21 LAB — COMPREHENSIVE METABOLIC PANEL
ALBUMIN: 4.3 g/dL (ref 3.6–5.1)
ALT: 11 U/L (ref 6–29)
AST: 14 U/L (ref 10–35)
Alkaline Phosphatase: 58 U/L (ref 33–130)
BUN: 12 mg/dL (ref 7–25)
CHLORIDE: 104 mmol/L (ref 98–110)
CO2: 23 mmol/L (ref 20–31)
CREATININE: 0.99 mg/dL (ref 0.50–1.05)
Calcium: 9 mg/dL (ref 8.6–10.4)
Glucose, Bld: 109 mg/dL — ABNORMAL HIGH (ref 70–99)
POTASSIUM: 4.6 mmol/L (ref 3.5–5.3)
SODIUM: 141 mmol/L (ref 135–146)
TOTAL PROTEIN: 6.7 g/dL (ref 6.1–8.1)
Total Bilirubin: 0.3 mg/dL (ref 0.2–1.2)

## 2016-11-21 LAB — CBC WITH DIFFERENTIAL/PLATELET
BASOS ABS: 0 {cells}/uL (ref 0–200)
Basophils Relative: 0 %
EOS ABS: 147 {cells}/uL (ref 15–500)
Eosinophils Relative: 3 %
HCT: 34.3 % — ABNORMAL LOW (ref 35.0–45.0)
HEMOGLOBIN: 10.5 g/dL — AB (ref 12.0–15.0)
LYMPHS ABS: 1421 {cells}/uL (ref 850–3900)
Lymphocytes Relative: 29 %
MCH: 24.2 pg — AB (ref 27.0–33.0)
MCHC: 30.6 g/dL — ABNORMAL LOW (ref 32.0–36.0)
MCV: 79 fL — AB (ref 80.0–100.0)
MONO ABS: 392 {cells}/uL (ref 200–950)
MPV: 10.8 fL (ref 7.5–12.5)
Monocytes Relative: 8 %
NEUTROS ABS: 2940 {cells}/uL (ref 1500–7800)
Neutrophils Relative %: 60 %
Platelets: 278 10*3/uL (ref 140–400)
RBC: 4.34 MIL/uL (ref 3.80–5.10)
RDW: 17.6 % — ABNORMAL HIGH (ref 11.0–15.0)
WBC: 4.9 10*3/uL (ref 3.8–10.8)

## 2016-11-21 LAB — LIPID PANEL
Cholesterol: 164 mg/dL (ref <200)
HDL: 54 mg/dL (ref >50)
LDL Cholesterol: 82 mg/dL (ref <100)
Total CHOL/HDL Ratio: 3 Ratio (ref <5.0)
Triglycerides: 140 mg/dL (ref <150)
VLDL: 28 mg/dL (ref <30)

## 2016-11-21 MED ORDER — BLOOD GLUCOSE TEST VI STRP: ORAL_STRIP | 1 refills | Status: DC

## 2016-11-21 MED ORDER — ZOLPIDEM TARTRATE 5 MG PO TABS: ORAL_TABLET | ORAL | 3 refills | Status: DC

## 2016-11-21 MED ORDER — BLOOD GLUCOSE SYSTEM PAK KIT: PACK | 1 refills | Status: DC

## 2016-11-21 NOTE — Assessment & Plan Note (Addendum)
Blood pressure much improved, no change to meds  angioedeme with ACEI

## 2016-11-21 NOTE — Patient Instructions (Signed)
F/U 3 months for Physical We will call with results

## 2016-11-21 NOTE — Addendum Note (Signed)
Addended by: Sheral Flow on: 11/21/2016 08:46 AM   Modules accepted: Orders
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
## 2016-11-21 NOTE — Assessment & Plan Note (Signed)
Continue metformin, ASA ,statin, allergy to ACEI Check urine micro/ foot exam done  Eye exam UTD
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
## 2016-11-21 NOTE — Assessment & Plan Note (Signed)
Discussed diet and need for some activity which will also help stress levels

## 2016-11-22 LAB — MICROALBUMIN / CREATININE URINE RATIO
CREATININE, URINE: 167 mg/dL (ref 20–320)
MICROALB/CREAT RATIO: 59 ug/mg{creat} — AB (ref <30)
Microalb, Ur: 9.9 mg/dL

## 2016-11-22 LAB — HEMOGLOBIN A1C
Hgb A1c MFr Bld: 6.2 % — ABNORMAL HIGH (ref <5.7)
Mean Plasma Glucose: 131 mg/dL

## 2016-11-23 ENCOUNTER — Encounter: Payer: Self-pay | Admitting: *Deleted

## 2016-11-27 ENCOUNTER — Encounter: Payer: Self-pay | Admitting: Family Medicine

## 2016-11-28 ENCOUNTER — Other Ambulatory Visit: Payer: Self-pay | Admitting: Family Medicine

## 2016-11-28 ENCOUNTER — Other Ambulatory Visit: Payer: Self-pay | Admitting: Physician Assistant

## 2016-11-28 DIAGNOSIS — I10 Essential (primary) hypertension: Secondary | ICD-10-CM

## 2016-11-28 NOTE — Telephone Encounter (Signed)
Refill appropriate 

## 2016-12-12 ENCOUNTER — Other Ambulatory Visit: Payer: Self-pay | Admitting: Family Medicine

## 2017-01-14 DIAGNOSIS — E890 Postprocedural hypothyroidism: Secondary | ICD-10-CM | POA: Diagnosis not present

## 2017-02-09 ENCOUNTER — Other Ambulatory Visit: Payer: Self-pay | Admitting: Family Medicine

## 2017-02-09 ENCOUNTER — Other Ambulatory Visit: Payer: Self-pay | Admitting: Gastroenterology

## 2017-02-10 ENCOUNTER — Other Ambulatory Visit: Payer: Self-pay | Admitting: Physician Assistant

## 2017-02-10 DIAGNOSIS — I10 Essential (primary) hypertension: Secondary | ICD-10-CM

## 2017-02-11 NOTE — Telephone Encounter (Signed)
Refill appropriate 

## 2017-02-20 ENCOUNTER — Ambulatory Visit: Payer: Medicare HMO | Admitting: Family Medicine

## 2017-03-04 ENCOUNTER — Encounter: Payer: Self-pay | Admitting: Family Medicine

## 2017-03-04 ENCOUNTER — Telehealth: Payer: Self-pay | Admitting: Family Medicine

## 2017-03-04 ENCOUNTER — Ambulatory Visit (INDEPENDENT_AMBULATORY_CARE_PROVIDER_SITE_OTHER): Payer: Medicare HMO | Admitting: Family Medicine

## 2017-03-04 VITALS — BP 134/72 | HR 54 | Temp 98.7°F | Resp 14 | Ht 67.0 in | Wt 313.0 lb

## 2017-03-04 DIAGNOSIS — F418 Other specified anxiety disorders: Secondary | ICD-10-CM

## 2017-03-04 DIAGNOSIS — M79671 Pain in right foot: Secondary | ICD-10-CM | POA: Diagnosis not present

## 2017-03-04 DIAGNOSIS — E1143 Type 2 diabetes mellitus with diabetic autonomic (poly)neuropathy: Secondary | ICD-10-CM | POA: Diagnosis not present

## 2017-03-04 DIAGNOSIS — Z0000 Encounter for general adult medical examination without abnormal findings: Secondary | ICD-10-CM | POA: Diagnosis not present

## 2017-03-04 DIAGNOSIS — I10 Essential (primary) hypertension: Secondary | ICD-10-CM

## 2017-03-04 DIAGNOSIS — Z124 Encounter for screening for malignant neoplasm of cervix: Secondary | ICD-10-CM

## 2017-03-04 DIAGNOSIS — Z1231 Encounter for screening mammogram for malignant neoplasm of breast: Secondary | ICD-10-CM | POA: Diagnosis not present

## 2017-03-04 DIAGNOSIS — Z1159 Encounter for screening for other viral diseases: Secondary | ICD-10-CM | POA: Diagnosis not present

## 2017-03-04 DIAGNOSIS — E782 Mixed hyperlipidemia: Secondary | ICD-10-CM | POA: Diagnosis not present

## 2017-03-04 DIAGNOSIS — Z1239 Encounter for other screening for malignant neoplasm of breast: Secondary | ICD-10-CM

## 2017-03-04 LAB — CBC WITH DIFFERENTIAL/PLATELET
BASOS ABS: 0 {cells}/uL (ref 0–200)
Basophils Relative: 0 %
EOS ABS: 188 {cells}/uL (ref 15–500)
Eosinophils Relative: 4 %
HEMATOCRIT: 33.6 % — AB (ref 35.0–45.0)
HEMOGLOBIN: 10.5 g/dL — AB (ref 12.0–15.0)
LYMPHS ABS: 1316 {cells}/uL (ref 850–3900)
Lymphocytes Relative: 28 %
MCH: 24.6 pg — ABNORMAL LOW (ref 27.0–33.0)
MCHC: 31.3 g/dL — ABNORMAL LOW (ref 32.0–36.0)
MCV: 78.7 fL — AB (ref 80.0–100.0)
MPV: 10.4 fL (ref 7.5–12.5)
Monocytes Absolute: 282 cells/uL (ref 200–950)
Monocytes Relative: 6 %
NEUTROS ABS: 2914 {cells}/uL (ref 1500–7800)
Neutrophils Relative %: 62 %
Platelets: 284 10*3/uL (ref 140–400)
RBC: 4.27 MIL/uL (ref 3.80–5.10)
RDW: 16.5 % — ABNORMAL HIGH (ref 11.0–15.0)
WBC: 4.7 10*3/uL (ref 3.8–10.8)

## 2017-03-04 LAB — COMPREHENSIVE METABOLIC PANEL
ALBUMIN: 4.2 g/dL (ref 3.6–5.1)
ALT: 10 U/L (ref 6–29)
AST: 14 U/L (ref 10–35)
Alkaline Phosphatase: 56 U/L (ref 33–130)
BUN: 10 mg/dL (ref 7–25)
CALCIUM: 8.8 mg/dL (ref 8.6–10.4)
CHLORIDE: 104 mmol/L (ref 98–110)
CO2: 26 mmol/L (ref 20–32)
CREATININE: 0.96 mg/dL (ref 0.50–1.05)
Glucose, Bld: 92 mg/dL (ref 70–99)
POTASSIUM: 4.4 mmol/L (ref 3.5–5.3)
Sodium: 141 mmol/L (ref 135–146)
TOTAL PROTEIN: 6.5 g/dL (ref 6.1–8.1)
Total Bilirubin: 0.3 mg/dL (ref 0.2–1.2)

## 2017-03-04 LAB — LIPID PANEL
CHOL/HDL RATIO: 2.7 ratio (ref <5.0)
Cholesterol: 137 mg/dL (ref <200)
HDL: 50 mg/dL — AB (ref >50)
LDL Cholesterol: 70 mg/dL (ref <100)
Triglycerides: 87 mg/dL (ref <150)
VLDL: 17 mg/dL (ref <30)

## 2017-03-04 MED ORDER — NEBIVOLOL HCL 10 MG PO TABS: ORAL_TABLET | ORAL | 2 refills | Status: DC

## 2017-03-04 MED ORDER — ZOSTER VAC RECOMB ADJUVANTED 50 MCG/0.5ML IM SUSR
0.5000 mL | Freq: Once | INTRAMUSCULAR | 0 refills | Status: AC
Start: 2017-03-04 — End: 2017-03-04

## 2017-03-04 NOTE — Telephone Encounter (Signed)
Pt wanted to know if it is okay to donate blood or not, please call her and let her know they are doing a blood drive at church and she wants to donate if she can. You can leave her a message on her phone if you do not reach her.

## 2017-03-04 NOTE — Telephone Encounter (Signed)
Will let her know when her labs come back

## 2017-03-04 NOTE — Telephone Encounter (Signed)
Call placed to patient and patient made aware.  

## 2017-03-04 NOTE — Progress Notes (Signed)
Subjective:   Patient presents for Medicare Annual/Subsequent preventive examination.    Due for PAP Smear  Medications reviewed   Follows with her specialist for thyroid  DM- Last A1C 6.2%  , CBG run around 140 or less  , on MTF, ASA, Statin , Drinks coffee/cream/sugar a lot, states will only eat once a day some days, weight down 3lbs   Discussed HIV/HEP C screening   Right foot pain on side, feels like it had a knot- pain for 2 months, had bone spurs in the past removed by podiatry  Review Past Medical/Family/Social: Per EMR   Risk Factors  Current exercise habits: None Dietary issues discussed: YES  Cardiac risk factors: Obesity (BMI >= 30 kg/m2). HTN, DM, Hyperlipidemia   Depression Screen  (Note: if answer to either of the following is "Yes", a more complete depression screening is indicated)  Over the past two weeks, have you felt down, depressed or hopeless? No Over the past two weeks, have you felt little interest or pleasure in doing things? No Have you lost interest or pleasure in daily life? No Do you often feel hopeless? No Do you cry easily over simple problems? No   Activities of Daily Living  In your present state of health, do you have any difficulty performing the following activities?:  Driving? No  Managing money? No  Feeding yourself? No  Getting from bed to chair? No  Climbing a flight of stairs? yes  Preparing food and eating?: No  Bathing or showering? No  Getting dressed: No  Getting to the toilet? No  Using the toilet:No  Moving around from place to place: yes  In the past year have you fallen or had a near fall?:No  Are you sexually active? No  Do you have more than one partner? No   Hearing Difficulties: No  Do you often ask people to speak up or repeat themselves? No  Do you experience ringing or noises in your ears? No Do you have difficulty understanding soft or whispered voices? No  Do you feel that you have a problem with  memory?yes Do you often misplace items? yes Do you feel safe at home? Yes  Cognitive Testing  Alert? Yes Normal Appearance?Yes  Oriented to person? Yes Place? Yes  Time? Yes  Recall of three objects? Yes  Can perform simple calculations? Yes  Displays appropriate judgment?Yes  Can read the correct time from a watch face?Yes   List the Names of Other Physician/Practitioners you currently use: Endocrine, orthopedics- Dr. Emiliano Dyer -completed gel shots, next step is surgery, she is using topicals Keeps getting thyroid medication changed, feels like this effects her wieght a lot     Screening Tests / Date Colonoscopy          UTD           Zostavax  Due  Mammogram Due PAP Smear Due , last in 2014  Influenza Vaccine  UD Tetanus/tdap not  Covered  Pneumonia- UTD  ROS: GEN- denies fatigue, fever, weight loss,weakness, recent illness HEENT- denies eye drainage, change in vision, nasal discharge, CVS- denies chest pain, palpitations RESP- denies SOB, cough, wheeze ABD- denies N/V, change in stools, abd pain GU- denies dysuria, hematuria, dribbling, incontinence MSK- denies joint pain, muscle aches, injury Neuro- denies headache, dizziness, syncope, seizure activity  Physical:  GEN- NAD, alert and oriented x3 HEENT- PERRL, EOMI, non injected sclera, pink conjunctiva, MMM, oropharynx clear Neck- Supple, no thryomegaly Breast- normal symmetry, no nipple inversion,no nipple  drainage, no nodules or lumps felt Nodes- no axillary nodes CVS- RRR, no murmur RESP-CTAB ABD-NABS,soft,NT,ND GU- normal external genitalia, vaginal mucosa pink and moist, cervix visualized no growth, no blood form os, minimal thin clear discharge, no CMT, no ovarian masses, uterus normal size EXT- chronic pedal edema , right foot, small nodule palpated lateral edge of foot, TTP, good ROM ankle, foot  Pulses- Radial, DP- 2+    Assessment:    Annual wellness medicare exam   Plan:    During the course of  the visit the patient was educated and counseled about appropriate screening and preventive services including:  Screening mammography  PAP Smear done  Shingles vaccine. Prescription given to that she can get the vaccine at the pharmacy or Medicare part D.       Fasting labs done       Declines  HIV screen Hep C to be done      Depresssion/ with anxiety- she feels like things have improved over the past 2 months. Her brothers have moved out of her home her daughter however still has some health issues and the grandchildren are there every other week. She felt like she benefited from counseling with her pastor and his wife. She does feel like her Prozac is working she still has some down days but overall feels much better. She does that she needs to exercise this will help with her stress and her mental cloudiness that time. With regards to her memory she states that she often forgets names. She will write list just to keep her on track.  No change to Prozac days     Foot nodule-possible spur or nodule/neuroma, she declines work up at this time, can apply her topical anti-inflammatory    Given handout on advanced directives    Diet review for nutrition referral? Yes ____ Not Indicated __x__  Patient Instructions (the written plan) was given to the patient.  Medicare Attestation  I have personally reviewed:  The patient's medical and social history  Their use of alcohol, tobacco or illicit drugs  Their current medications and supplements  The patient's functional ability including ADLs,fall risks, home safety risks, cognitive, and hearing and visual impairment  Diet and physical activities  Evidence for depression or mood disorders  The patient's weight, height, BMI, and visual acuity have been recorded in the chart. I have made referrals, counseling, and provided education to the patient based on review of the above and I have provided the patient with a written personalized care plan for  preventive services.

## 2017-03-04 NOTE — Telephone Encounter (Signed)
MD please advise

## 2017-03-04 NOTE — Patient Instructions (Signed)
Shingles vaccine sent to pharmacy We will call with lab results Work on exercise  Call if you want xray  of foot Schedule your mammogram  F/U 4 Months

## 2017-03-05 LAB — HEPATITIS C ANTIBODY: HCV AB: NONREACTIVE

## 2017-03-05 LAB — HEMOGLOBIN A1C
HEMOGLOBIN A1C: 6.3 % — AB (ref <5.7)
MEAN PLASMA GLUCOSE: 134 mg/dL

## 2017-03-06 LAB — PAP THINPREP ASCUS RFLX HPV RFLX TYPE

## 2017-03-08 ENCOUNTER — Encounter: Payer: Self-pay | Admitting: *Deleted

## 2017-03-18 DIAGNOSIS — E890 Postprocedural hypothyroidism: Secondary | ICD-10-CM | POA: Diagnosis not present

## 2017-04-12 ENCOUNTER — Ambulatory Visit: Payer: Self-pay

## 2017-04-18 ENCOUNTER — Ambulatory Visit
Admission: RE | Admit: 2017-04-18 | Discharge: 2017-04-18 | Disposition: A | Discharge status: Home / Self Care | Payer: Commercial Managed Care - HMO | Source: Ambulatory Visit | Attending: Family Medicine | Admitting: Family Medicine

## 2017-04-18 DIAGNOSIS — Z1231 Encounter for screening mammogram for malignant neoplasm of breast: Secondary | ICD-10-CM | POA: Diagnosis not present

## 2017-04-18 DIAGNOSIS — Z1239 Encounter for other screening for malignant neoplasm of breast: Secondary | ICD-10-CM

## 2017-05-19 ENCOUNTER — Other Ambulatory Visit: Payer: Self-pay | Admitting: Physician Assistant

## 2017-05-19 ENCOUNTER — Other Ambulatory Visit: Payer: Self-pay | Admitting: Family Medicine

## 2017-05-19 DIAGNOSIS — I10 Essential (primary) hypertension: Secondary | ICD-10-CM

## 2017-05-20 ENCOUNTER — Other Ambulatory Visit: Payer: Self-pay | Admitting: *Deleted

## 2017-05-20 MED ORDER — METFORMIN HCL 1000 MG PO TABS: ORAL_TABLET | ORAL | 0 refills | Status: DC

## 2017-05-20 NOTE — Telephone Encounter (Signed)
Prescription faxed

## 2017-05-20 NOTE — Telephone Encounter (Signed)
Ok to refill??  Last office visit 03/04/2017.  Last refill 11/21/2016, #3 refills.

## 2017-05-20 NOTE — Telephone Encounter (Signed)
Medication refilled per protocol. 

## 2017-05-20 NOTE — Telephone Encounter (Signed)
Okay to refill? 

## 2017-07-05 ENCOUNTER — Other Ambulatory Visit: Payer: Self-pay

## 2017-07-05 ENCOUNTER — Encounter: Payer: Self-pay | Admitting: Family Medicine

## 2017-07-05 ENCOUNTER — Ambulatory Visit: Payer: Medicare HMO | Admitting: Family Medicine

## 2017-07-05 VITALS — BP 130/72 | HR 68 | Temp 98.8°F | Resp 18 | Ht 67.0 in | Wt 314.0 lb

## 2017-07-05 DIAGNOSIS — E1143 Type 2 diabetes mellitus with diabetic autonomic (poly)neuropathy: Secondary | ICD-10-CM

## 2017-07-05 DIAGNOSIS — D649 Anemia, unspecified: Secondary | ICD-10-CM | POA: Diagnosis not present

## 2017-07-05 DIAGNOSIS — I10 Essential (primary) hypertension: Secondary | ICD-10-CM

## 2017-07-05 DIAGNOSIS — E782 Mixed hyperlipidemia: Secondary | ICD-10-CM

## 2017-07-05 NOTE — Assessment & Plan Note (Addendum)
Has been controlled, needs significant weight loss, goal is to get below 300lbs Goal A1C < 7% On MTF, ACE, STATIN

## 2017-07-05 NOTE — Assessment & Plan Note (Signed)
Hb has been stable

## 2017-07-05 NOTE — Assessment & Plan Note (Signed)
Well controlled no changes Fasting labs today 

## 2017-07-05 NOTE — Progress Notes (Signed)
   Subjective:    Patient ID: Autumn Carson, female    DOB: 1955-11-18, 61 y.o.   MRN: [id number]  Patient presents for Follow-up (is fasting)   Pt here to f/u chronic medical problems Seen by her orthopedic told both knees need to replaced- [employer]- she is planning for possibly off the holidays    HTN- taking BP medications as prescribed  DM- Last A1C 6.3%, highest 120, taking MTF twice a day , she is going through a diabetes program at church, it has nutrition   Still following with her endocrinollogist for her thyroid, told labs are stable, no dose change   FLu shot done       Review Of Systems:  GEN- denies fatigue, fever, weight loss,weakness, recent illness HEENT- denies eye drainage, change in vision, nasal discharge, CVS- denies chest pain, palpitations RESP- denies SOB, cough, wheeze ABD- denies N/V, change in stools, abd pain GU- denies dysuria, hematuria, dribbling, incontinence MSK-+ joint pain, muscle aches, injury Neuro- denies headache, dizziness, syncope, seizure activity       Objective:    BP 130/72   Pulse 68   Temp 98.8 F (37.1 C) (Oral)   Resp 18   Ht 5\' 7"  (1.702 m)   Wt (!) 314 lb (142.4 kg)   LMP 09/07/2011   SpO2 96%   BMI 49.18 kg/m  GEN- NAD, alert and oriented x3,obese  HEENT- PERRL, EOMI, non injected sclera, pink conjunctiva, MMM, oropharynx clear Neck- Supple, no thyromegaly CVS- RRR, no murmur RESP-CTAB ABD-NABS,soft,NT,ND EXT- No edema,wearing compression hose  Pulses- Radial,2+        Assessment & Plan:      Problem List Items Addressed This Visit      Unprioritized   Diabetic neuropathy ([place])   Morbid obesity ([place])   Hyperlipidemia    Lipids at goal on statin drug       Essential hypertension    Well controlled no changes Fasting labs today       Relevant Orders   CBC with Differential/Platelet   Comprehensive metabolic panel   Diabetes mellitus, type II ([place]) - Primary    Has  been controlled, needs significant weight loss, goal is to get below 300lbs Goal A1C < 7% On MTF, ACE, STATIN      Relevant Orders   CBC with Differential/Platelet   Hemoglobin A1c   Chronic anemia    Hb has been stable          Note: This dictation was prepared with Dragon dictation along with smaller phrase technology. Any transcriptional errors that result from this process are unintentional.

## 2017-07-05 NOTE — Patient Instructions (Signed)
F/U 4 months We will call with results Weight goal less than 300lbs

## 2017-07-05 NOTE — Assessment & Plan Note (Signed)
Lipids at goal on statin drug. 

## 2017-07-06 LAB — COMPREHENSIVE METABOLIC PANEL
AG Ratio: 1.6 (calc) (ref 1.0–2.5)
ALT: 12 U/L (ref 6–29)
AST: 17 U/L (ref 10–35)
Albumin: 4.1 g/dL (ref 3.6–5.1)
Alkaline phosphatase (APISO): 54 U/L (ref 33–130)
BUN: 10 mg/dL (ref 7–25)
CO2: 30 mmol/L (ref 20–32)
Calcium: 8.9 mg/dL (ref 8.6–10.4)
Chloride: 106 mmol/L (ref 98–110)
Creat: 0.89 mg/dL (ref 0.50–1.05)
Globulin: 2.6 g/dL (calc) (ref 1.9–3.7)
Glucose, Bld: 104 mg/dL — ABNORMAL HIGH (ref 65–99)
Potassium: 4.4 mmol/L (ref 3.5–5.3)
Sodium: 142 mmol/L (ref 135–146)
Total Bilirubin: 0.4 mg/dL (ref 0.2–1.2)
Total Protein: 6.7 g/dL (ref 6.1–8.1)

## 2017-07-06 LAB — HEMOGLOBIN A1C
Hgb A1c MFr Bld: 6.2 % of total Hgb — ABNORMAL HIGH (ref <5.7)
Mean Plasma Glucose: 131 (calc)
eAG (mmol/L): 7.3 (calc)

## 2017-07-06 LAB — CBC WITH DIFFERENTIAL/PLATELET
Basophils Absolute: 32 cells/uL (ref 0–200)
Basophils Relative: 0.7 %
Eosinophils Absolute: 161 cells/uL (ref 15–500)
Eosinophils Relative: 3.5 %
HCT: 32.8 % — ABNORMAL LOW (ref 35.0–45.0)
Hemoglobin: 10.2 g/dL — ABNORMAL LOW (ref 11.7–15.5)
Lymphs Abs: 1270 cells/uL (ref 850–3900)
MCH: 24.4 pg — ABNORMAL LOW (ref 27.0–33.0)
MCHC: 31.1 g/dL — ABNORMAL LOW (ref 32.0–36.0)
MCV: 78.5 fL — ABNORMAL LOW (ref 80.0–100.0)
MPV: 12 fL (ref 7.5–12.5)
Monocytes Relative: 7.2 %
Neutro Abs: 2806 cells/uL (ref 1500–7800)
Neutrophils Relative %: 61 %
Platelets: 259 10*3/uL (ref 140–400)
RBC: 4.18 10*6/uL (ref 3.80–5.10)
RDW: 15.7 % — ABNORMAL HIGH (ref 11.0–15.0)
Total Lymphocyte: 27.6 %
WBC mixed population: 331 cells/uL (ref 200–950)
WBC: 4.6 10*3/uL (ref 3.8–10.8)

## 2017-07-11 ENCOUNTER — Encounter: Payer: Self-pay | Admitting: *Deleted

## 2017-08-27 ENCOUNTER — Other Ambulatory Visit: Payer: Self-pay | Admitting: Family Medicine

## 2017-08-27 ENCOUNTER — Other Ambulatory Visit: Payer: Self-pay | Admitting: Physician Assistant

## 2017-08-27 DIAGNOSIS — I10 Essential (primary) hypertension: Secondary | ICD-10-CM

## 2017-08-28 ENCOUNTER — Other Ambulatory Visit: Payer: Self-pay | Admitting: Family Medicine

## 2017-09-02 ENCOUNTER — Other Ambulatory Visit: Payer: Self-pay | Admitting: Physician Assistant

## 2017-09-02 ENCOUNTER — Other Ambulatory Visit: Payer: Self-pay | Admitting: Family Medicine

## 2017-09-02 DIAGNOSIS — I10 Essential (primary) hypertension: Secondary | ICD-10-CM

## 2017-09-25 ENCOUNTER — Encounter: Payer: Self-pay | Admitting: Family Medicine

## 2017-09-25 ENCOUNTER — Other Ambulatory Visit: Payer: Self-pay

## 2017-09-25 ENCOUNTER — Ambulatory Visit (INDEPENDENT_AMBULATORY_CARE_PROVIDER_SITE_OTHER): Payer: Medicare HMO | Admitting: Family Medicine

## 2017-09-25 VITALS — BP 128/64 | HR 54 | Temp 98.7°F | Resp 14 | Ht 67.0 in | Wt 319.0 lb

## 2017-09-25 DIAGNOSIS — M48062 Spinal stenosis, lumbar region with neurogenic claudication: Secondary | ICD-10-CM

## 2017-09-25 DIAGNOSIS — Z981 Arthrodesis status: Secondary | ICD-10-CM | POA: Diagnosis not present

## 2017-09-25 DIAGNOSIS — M5136 Other intervertebral disc degeneration, lumbar region: Secondary | ICD-10-CM | POA: Diagnosis not present

## 2017-09-25 DIAGNOSIS — M4807 Spinal stenosis, lumbosacral region: Secondary | ICD-10-CM

## 2017-09-25 MED ORDER — TIZANIDINE HCL 4 MG PO TABS: 4.0000 mg | ORAL_TABLET | Freq: Four times a day (QID) | ORAL | 0 refills | Status: DC | PRN

## 2017-09-25 MED ORDER — PREDNISONE 10 MG PO TABS
ORAL_TABLET | ORAL | 0 refills | Status: DC
Start: 2017-09-25 — End: 2017-11-04

## 2017-09-25 MED ORDER — HYDROCODONE-ACETAMINOPHEN 5-325 MG PO TABS: 1.0000 | ORAL_TABLET | Freq: Four times a day (QID) | ORAL | 0 refills | Status: DC | PRN

## 2017-09-25 NOTE — Assessment & Plan Note (Signed)
She has known degenerative disc disease as well as some spinal stenosis with disc bulge from previous MRIs.  She is status post lumbar fusion years ago as well.  I am concerned that her remaining disc bulge with spinal stenosis has worsened after this fall where she jarred things.  Concerned about her neurological symptoms on the left side.  We can obtain an MRI of the lumbar spine we will also get in contact with her neurosurgeon.  In the meantime she is going to use her cane and I am to start her on steroid taper muscle relaxer she has been given hydrocodone for pain

## 2017-09-25 NOTE — Patient Instructions (Addendum)
MRI of lumbar spine to be done Take the steroids Take muscle relaxer  Take pain medicine as prescribed Use your cane  Referral to Dr. Trenton Gammon  F/U pending results

## 2017-09-25 NOTE — Progress Notes (Signed)
Subjective:    Patient ID: Autumn Carson, female    DOB: 04-03-1956, 62 y.o.   MRN: [id number]  Patient presents for Back Pain/ L Leg Pain (x2 weeks- S/P fall in ice- now has lower back pain that radiates down her L leg- reports that if she stands more than 10 minutes she has numbness in L leg)   2 weeks ago, stepped onto her deck in the ice and slipped onto her butt but hit her lower back. She fell down 4 steps. That night started having left leg pain and numbness. Has had intermittant numbness, tingling for the past 2 weeks. Yesterday at church was trying to stand to and her leg went numb and she went to move and stumbled. History of back surgery - fusion in 2012 MRI from 2016 Impression:  IMPRESSION: 1. Posterior lumbar interbody fusion at L3-L5 without recurrent foraminal or central canal stenosis. 2. At L2-3 there is a mild broad-based disc bulge with moderate bilateral facet arthropathy and mild spinal stenosis. 3. At L5-S1 there is severe bilateral facet arthropathy. She has been taking naproxen     Review Of Systems:  GEN- denies fatigue, fever, weight loss,weakness, recent illness HEENT- denies eye drainage, change in vision, nasal discharge, CVS- denies chest pain, palpitations RESP- denies SOB, cough, wheeze ABD- denies N/V, change in stools, abd pain GU- denies dysuria, hematuria, dribbling, incontinence MSK-+ joint pain, muscle aches, injury Neuro- denies headache, dizziness, syncope, seizure activity       Objective:    BP 128/64   Pulse (!) 54   Temp 98.7 F (37.1 C) (Oral)   Resp 14   Ht 5\' 7"  (1.702 m)   Wt (!) 319 lb (144.7 kg)   LMP 09/07/2011   SpO2 97%   BMI 49.96 kg/m  GEN- NAD, alert and oriented x3 MSK- TTP lumbar spine, previous incision noted, + spasm paraspinals, decreased ROM spine +SLR left side Neuro- decreased strength left LE compared to right, normal tone, antalgic gait, DTR in tact Ext- trace pedal edema         Assessment & Plan:      Problem List Items Addressed This Visit      Unprioritized   DDD (degenerative disc disease), lumbar - Primary    She has known degenerative disc disease as well as some spinal stenosis with disc bulge from previous MRIs.  She is status post lumbar fusion years ago as well.  I am concerned that her remaining disc bulge with spinal stenosis has worsened after this fall where she jarred things.  Concerned about her neurological symptoms on the left side.  We can obtain an MRI of the lumbar spine we will also get in contact with her neurosurgeon.  In the meantime she is going to use her cane and I am to start her on steroid taper muscle relaxer she has been given hydrocodone for pain      Relevant Medications   tiZANidine (ZANAFLEX) 4 MG tablet   predniSONE (DELTASONE) 10 MG tablet   HYDROcodone-acetaminophen (NORCO) 5-325 MG tablet   Other Relevant Orders   Ambulatory referral to Neurosurgery    Other Visit Diagnoses    Spinal stenosis of lumbar region with neurogenic claudication       Relevant Medications   tiZANidine (ZANAFLEX) 4 MG tablet   predniSONE (DELTASONE) 10 MG tablet   HYDROcodone-acetaminophen (NORCO) 5-325 MG tablet   Other Relevant Orders   Ambulatory referral to Neurosurgery   S/P lumbar spinal  fusion       Relevant Orders   Ambulatory referral to Neurosurgery   Spinal stenosis of lumbosacral region       Relevant Orders   MR Lumbar Spine Wo Contrast      Note: This dictation was prepared with Dragon dictation along with smaller phrase technology. Any transcriptional errors that result from this process are unintentional.

## 2017-10-15 ENCOUNTER — Ambulatory Visit
Admission: RE | Admit: 2017-10-15 | Discharge: 2017-10-15 | Disposition: A | Discharge status: Home / Self Care | Payer: Medicare HMO | Source: Ambulatory Visit | Attending: Family Medicine | Admitting: Family Medicine

## 2017-10-15 DIAGNOSIS — M4807 Spinal stenosis, lumbosacral region: Secondary | ICD-10-CM

## 2017-10-15 DIAGNOSIS — M48061 Spinal stenosis, lumbar region without neurogenic claudication: Secondary | ICD-10-CM | POA: Diagnosis not present

## 2017-11-04 ENCOUNTER — Other Ambulatory Visit: Payer: Self-pay

## 2017-11-04 ENCOUNTER — Ambulatory Visit (INDEPENDENT_AMBULATORY_CARE_PROVIDER_SITE_OTHER): Payer: Medicare HMO | Admitting: Family Medicine

## 2017-11-04 ENCOUNTER — Other Ambulatory Visit: Payer: Self-pay | Admitting: Family Medicine

## 2017-11-04 ENCOUNTER — Encounter: Payer: Self-pay | Admitting: Family Medicine

## 2017-11-04 VITALS — BP 130/68 | HR 62 | Temp 98.4°F | Resp 12 | Ht 67.0 in | Wt 317.0 lb

## 2017-11-04 DIAGNOSIS — E1143 Type 2 diabetes mellitus with diabetic autonomic (poly)neuropathy: Secondary | ICD-10-CM

## 2017-11-04 DIAGNOSIS — E782 Mixed hyperlipidemia: Secondary | ICD-10-CM | POA: Diagnosis not present

## 2017-11-04 DIAGNOSIS — I10 Essential (primary) hypertension: Secondary | ICD-10-CM

## 2017-11-04 NOTE — Patient Instructions (Signed)
F/U 4 months Physical  We will call with lab results

## 2017-11-04 NOTE — Assessment & Plan Note (Signed)
Blood pressure well controlled no changes ?

## 2017-11-04 NOTE — Progress Notes (Signed)
   Subjective:    Patient ID: Autumn Carson, female    DOB: [birth year], 62 y.o.   MRN: [id number]  Patient presents for Follow-up (is fasting)   Has appt on Thursday with Neurosurgery for her back pain, continues to get left leg pain and burning in her leg. Had MRI   Continues to see Orthopedics for bilat knee OA, has been getting steroid injectoins   DM- last A1C 6.2%, highest CBG 120, taking  Metformin, no hypoglycemia symptoms,eats lots of bread and pasta    Hyperlipidemia- taking statin drug    HTN- taking BP meds as prescribed    Due for eye exam    Has upcoming endocrinology appt for thyroid   Review Of Systems:  GEN- denies fatigue, fever, weight loss,weakness, recent illness HEENT- denies eye drainage, change in vision, nasal discharge, CVS- denies chest pain, palpitations RESP- denies SOB, cough, wheeze ABD- denies N/V, change in stools, abd pain GU- denies dysuria, hematuria, dribbling, incontinence MSK-+ joint pain, muscle aches, injury Neuro- denies headache, dizziness, syncope, seizure activity       Objective:    BP 130/68   Pulse 62   Temp 98.4 F (36.9 C) (Oral)   Resp 12   Ht 5\' 7"  (1.702 m)   Wt (!) 317 lb (143.8 kg)   LMP 09/07/2011   SpO2 98%   BMI 49.65 kg/m  GEN- NAD, alert and oriented x3 HEENT- PERRL, EOMI, non injected sclera, pink conjunctiva, MMM, oropharynx clear Neck- Supple, no thyromegaly CVS- RRR, soft systolic murmur? RESP-CTAB EXT- No edema Pulses- Radial, DP- 2+        Assessment & Plan:  Follow-up with neurosurgery this week.   Problem List Items Addressed This Visit      Unprioritized   Diabetic neuropathy (Autumn Carson)   Relevant Orders   CBC with Differential/Platelet   Comprehensive metabolic panel   Hemoglobin A1c   Microalbumin / creatinine urine ratio   HM DIABETES FOOT EXAM (Completed)   Morbid obesity (Autumn Carson)   Hyperlipidemia    Lipids to be rechecked.  She is on statin drug tolerating without  difficulty.      Relevant Orders   Lipid panel   Essential hypertension    Blood pressure well controlled no changes.      Diabetes mellitus, type II (Autumn Carson) - Primary    Autumn Carson has been well controlled.  He is significant weight loss and change in her diet.  We discussed cutting out the carbs bread pasta rice which she admits that she does eat a lot of.  She does not eat very many snacks or drinks soda.  Her first goal is to get her below 300 pounds which she has done in the past with dietary changes. She is to schedule eye exam         Note: This dictation was prepared with Dragon dictation along with smaller phrase technology. Any transcriptional errors that result from this process are unintentional.

## 2017-11-04 NOTE — Assessment & Plan Note (Signed)
Lipids to be rechecked.  She is on statin drug tolerating without difficulty.

## 2017-11-04 NOTE — Assessment & Plan Note (Addendum)
[name] has been well controlled.  He is significant weight loss and change in her diet.  We discussed cutting out the carbs bread pasta rice which she admits that she does eat a lot of.  She does not eat very many snacks or drinks soda.  Her first goal is to get her below 300 pounds which she has done in the past with dietary changes. She is to schedule eye exam

## 2017-11-05 LAB — LIPID PANEL
CHOL/HDL RATIO: 2.7 (calc) (ref <5.0)
Cholesterol: 149 mg/dL (ref <200)
HDL: 55 mg/dL (ref >50)
LDL Cholesterol (Calc): 75 mg/dL (calc)
Non-HDL Cholesterol (Calc): 94 mg/dL (calc) (ref <130)
TRIGLYCERIDES: 105 mg/dL (ref <150)

## 2017-11-05 LAB — CBC WITH DIFFERENTIAL/PLATELET
Basophils Absolute: 38 cells/uL (ref 0–200)
Basophils Relative: 0.6 %
EOS PCT: 3 %
Eosinophils Absolute: 189 cells/uL (ref 15–500)
HEMATOCRIT: 32.4 % — AB (ref 35.0–45.0)
Hemoglobin: 10.4 g/dL — ABNORMAL LOW (ref 11.7–15.5)
LYMPHS ABS: 1644 {cells}/uL (ref 850–3900)
MCH: 24.9 pg — ABNORMAL LOW (ref 27.0–33.0)
MCHC: 32.1 g/dL (ref 32.0–36.0)
MCV: 77.5 fL — ABNORMAL LOW (ref 80.0–100.0)
MONOS PCT: 7.4 %
MPV: 11.8 fL (ref 7.5–12.5)
NEUTROS PCT: 62.9 %
Neutro Abs: 3963 cells/uL (ref 1500–7800)
PLATELETS: 282 10*3/uL (ref 140–400)
RBC: 4.18 10*6/uL (ref 3.80–5.10)
RDW: 15.3 % — ABNORMAL HIGH (ref 11.0–15.0)
TOTAL LYMPHOCYTE: 26.1 %
WBC mixed population: 466 cells/uL (ref 200–950)
WBC: 6.3 10*3/uL (ref 3.8–10.8)

## 2017-11-05 LAB — COMPREHENSIVE METABOLIC PANEL
AG Ratio: 2.5 (calc) (ref 1.0–2.5)
ALBUMIN MSPROF: 4.7 g/dL (ref 3.6–5.1)
ALT: 11 U/L (ref 6–29)
AST: 15 U/L (ref 10–35)
Alkaline phosphatase (APISO): 59 U/L (ref 33–130)
BUN: 10 mg/dL (ref 7–25)
CO2: 29 mmol/L (ref 20–32)
CREATININE: 0.84 mg/dL (ref 0.50–1.05)
Calcium: 9.2 mg/dL (ref 8.6–10.4)
Chloride: 103 mmol/L (ref 98–110)
GLUCOSE: 104 mg/dL — AB (ref 65–99)
Globulin: 1.9 g/dL (calc) (ref 1.9–3.7)
POTASSIUM: 4.1 mmol/L (ref 3.5–5.3)
SODIUM: 141 mmol/L (ref 135–146)
TOTAL PROTEIN: 6.6 g/dL (ref 6.1–8.1)
Total Bilirubin: 0.3 mg/dL (ref 0.2–1.2)

## 2017-11-05 LAB — MICROALBUMIN / CREATININE URINE RATIO
Creatinine, Urine: 129 mg/dL (ref 20–275)
MICROALB UR: 12.1 mg/dL
MICROALB/CREAT RATIO: 94 ug/mg{creat} — AB (ref <30)

## 2017-11-05 LAB — HEMOGLOBIN A1C
EAG (MMOL/L): 7.7 (calc)
Hgb A1c MFr Bld: 6.5 % of total Hgb — ABNORMAL HIGH (ref <5.7)
MEAN PLASMA GLUCOSE: 140 (calc)

## 2017-11-06 ENCOUNTER — Encounter: Payer: Self-pay | Admitting: *Deleted

## 2017-11-08 DIAGNOSIS — E890 Postprocedural hypothyroidism: Secondary | ICD-10-CM | POA: Diagnosis not present

## 2017-11-08 DIAGNOSIS — Z85850 Personal history of malignant neoplasm of thyroid: Secondary | ICD-10-CM | POA: Diagnosis not present

## 2017-11-08 DIAGNOSIS — M48062 Spinal stenosis, lumbar region with neurogenic claudication: Secondary | ICD-10-CM | POA: Diagnosis not present

## 2017-11-08 DIAGNOSIS — I10 Essential (primary) hypertension: Secondary | ICD-10-CM | POA: Diagnosis not present

## 2017-11-08 DIAGNOSIS — Z6841 Body Mass Index (BMI) 40.0 and over, adult: Secondary | ICD-10-CM | POA: Diagnosis not present

## 2017-11-14 DIAGNOSIS — Z1211 Encounter for screening for malignant neoplasm of colon: Secondary | ICD-10-CM | POA: Diagnosis not present

## 2017-11-14 DIAGNOSIS — E890 Postprocedural hypothyroidism: Secondary | ICD-10-CM | POA: Diagnosis not present

## 2017-11-14 DIAGNOSIS — Z6841 Body Mass Index (BMI) 40.0 and over, adult: Secondary | ICD-10-CM | POA: Diagnosis not present

## 2017-11-14 DIAGNOSIS — Z85850 Personal history of malignant neoplasm of thyroid: Secondary | ICD-10-CM | POA: Diagnosis not present

## 2017-12-20 DIAGNOSIS — E890 Postprocedural hypothyroidism: Secondary | ICD-10-CM | POA: Diagnosis not present

## 2018-01-16 DIAGNOSIS — H5213 Myopia, bilateral: Secondary | ICD-10-CM | POA: Diagnosis not present

## 2018-01-17 ENCOUNTER — Telehealth: Payer: Self-pay | Admitting: Family Medicine

## 2018-01-17 ENCOUNTER — Other Ambulatory Visit: Payer: Self-pay | Admitting: Nurse Practitioner

## 2018-01-17 DIAGNOSIS — I10 Essential (primary) hypertension: Secondary | ICD-10-CM

## 2018-01-17 MED ORDER — LEVOTHYROXINE SODIUM 150 MCG PO TABS: 150.0000 ug | ORAL_TABLET | Freq: Every day | ORAL | 1 refills | Status: DC

## 2018-01-17 MED ORDER — GABAPENTIN 300 MG PO CAPS: 300.0000 mg | ORAL_CAPSULE | Freq: Two times a day (BID) | ORAL | 1 refills | Status: DC

## 2018-01-17 MED ORDER — AMLODIPINE BESYLATE 10 MG PO TABS
10.0000 mg | ORAL_TABLET | Freq: Every day | ORAL | 3 refills | Status: DC
Start: 2018-01-17 — End: 2018-02-13

## 2018-01-17 MED ORDER — METFORMIN HCL 1000 MG PO TABS: ORAL_TABLET | ORAL | 1 refills | Status: DC

## 2018-01-17 NOTE — Telephone Encounter (Signed)
Pt needs refill on metformin, amlodipine, synthroid, and gabapentin sent to [facility].

## 2018-01-17 NOTE — Telephone Encounter (Signed)
Prescription sent to pharmacy.

## 2018-01-22 ENCOUNTER — Encounter: Payer: Self-pay | Admitting: *Deleted

## 2018-01-22 ENCOUNTER — Telehealth: Payer: Self-pay | Admitting: Family Medicine

## 2018-01-22 DIAGNOSIS — Z6841 Body Mass Index (BMI) 40.0 and over, adult: Secondary | ICD-10-CM | POA: Diagnosis not present

## 2018-01-22 DIAGNOSIS — M48062 Spinal stenosis, lumbar region with neurogenic claudication: Secondary | ICD-10-CM | POA: Diagnosis not present

## 2018-01-22 NOTE — Telephone Encounter (Signed)
Returned call to patient.   States that she was seen by ortho and recommended that she have epidural injections for her back pain.   States that PCP advised her if she started steroids to notify office d/t enlarged heart. Wanted MD to review and advise if injections would be safe.   MD please advise.

## 2018-01-22 NOTE — Telephone Encounter (Signed)
Patient calling to talk to you regarding what the ortho went over with her  [phone]

## 2018-01-22 NOTE — Telephone Encounter (Signed)
Okay to have the injection Her blood sugars may run up some

## 2018-01-22 NOTE — Telephone Encounter (Signed)
This encounter was created in error - please disregard.

## 2018-01-23 NOTE — Telephone Encounter (Signed)
Call placed to patient and patient made aware.  

## 2018-02-13 ENCOUNTER — Other Ambulatory Visit: Payer: Self-pay | Admitting: Family Medicine

## 2018-02-13 ENCOUNTER — Other Ambulatory Visit: Payer: Self-pay | Admitting: Physician Assistant

## 2018-02-13 DIAGNOSIS — I10 Essential (primary) hypertension: Secondary | ICD-10-CM

## 2018-02-14 DIAGNOSIS — H40013 Open angle with borderline findings, low risk, bilateral: Secondary | ICD-10-CM | POA: Diagnosis not present

## 2018-02-14 LAB — HM DIABETES EYE EXAM

## 2018-02-14 NOTE — Telephone Encounter (Signed)
Ok to refill Ambien to mail order??  Last office visit 11/04/2017.  Last refill 05/20/2017.

## 2018-02-18 DIAGNOSIS — Z6841 Body Mass Index (BMI) 40.0 and over, adult: Secondary | ICD-10-CM | POA: Diagnosis not present

## 2018-02-18 DIAGNOSIS — E1169 Type 2 diabetes mellitus with other specified complication: Secondary | ICD-10-CM | POA: Diagnosis not present

## 2018-02-18 DIAGNOSIS — M48062 Spinal stenosis, lumbar region with neurogenic claudication: Secondary | ICD-10-CM | POA: Diagnosis not present

## 2018-02-18 DIAGNOSIS — I10 Essential (primary) hypertension: Secondary | ICD-10-CM | POA: Diagnosis not present

## 2018-03-05 DIAGNOSIS — M48062 Spinal stenosis, lumbar region with neurogenic claudication: Secondary | ICD-10-CM | POA: Diagnosis not present

## 2018-03-10 ENCOUNTER — Encounter: Payer: Self-pay | Admitting: Family Medicine

## 2018-03-10 ENCOUNTER — Ambulatory Visit (INDEPENDENT_AMBULATORY_CARE_PROVIDER_SITE_OTHER): Payer: Medicare HMO | Admitting: Family Medicine

## 2018-03-10 VITALS — BP 134/72 | HR 64 | Temp 97.9°F | Resp 16 | Ht 67.0 in | Wt 308.0 lb

## 2018-03-10 DIAGNOSIS — E038 Other specified hypothyroidism: Secondary | ICD-10-CM

## 2018-03-10 DIAGNOSIS — Z1239 Encounter for other screening for malignant neoplasm of breast: Secondary | ICD-10-CM

## 2018-03-10 DIAGNOSIS — Z114 Encounter for screening for human immunodeficiency virus [HIV]: Secondary | ICD-10-CM

## 2018-03-10 DIAGNOSIS — D649 Anemia, unspecified: Secondary | ICD-10-CM | POA: Diagnosis not present

## 2018-03-10 DIAGNOSIS — E1143 Type 2 diabetes mellitus with diabetic autonomic (poly)neuropathy: Secondary | ICD-10-CM

## 2018-03-10 DIAGNOSIS — Z1231 Encounter for screening mammogram for malignant neoplasm of breast: Secondary | ICD-10-CM | POA: Diagnosis not present

## 2018-03-10 DIAGNOSIS — Z0000 Encounter for general adult medical examination without abnormal findings: Secondary | ICD-10-CM

## 2018-03-10 DIAGNOSIS — M150 Primary generalized (osteo)arthritis: Secondary | ICD-10-CM | POA: Diagnosis not present

## 2018-03-10 DIAGNOSIS — M159 Polyosteoarthritis, unspecified: Secondary | ICD-10-CM

## 2018-03-10 DIAGNOSIS — M8949 Other hypertrophic osteoarthropathy, multiple sites: Secondary | ICD-10-CM

## 2018-03-10 MED ORDER — ZOSTER VAC RECOMB ADJUVANTED 50 MCG/0.5ML IM SUSR: 0.5000 mL | Freq: Once | INTRAMUSCULAR | 1 refills | Status: AC

## 2018-03-10 MED ORDER — TETANUS-DIPHTH-ACELL PERTUSSIS 5-2.5-18.5 LF-MCG/0.5 IM SUSP: 0.5000 mL | Freq: Once | INTRAMUSCULAR | 0 refills | Status: AC

## 2018-03-10 NOTE — Patient Instructions (Addendum)
Schedule your mammogram  Pain medication refilled  Shingles sent to pharmacy  We will call with lab results  F/U December

## 2018-03-10 NOTE — Progress Notes (Signed)
Subjective:   Patient presents for Medicare Annual/Subsequent preventive examination.    Seen by Dr. Brien Few for chronic back pain, had injections done based off last MRI, history of back surgery    Dr Quay Burow- checked eyes, pressures mildly elevated, has recheck in 6 months    Dr. Buddy Duty- had thyroid checked and stable has f/u in 1 year    DM- last A1C 6.5%, CBG did spike last week to 188 fasting after steroid injection in her back, yesterday CBG 96 , taking metformin , ASA    HTN-  Blood pressure has been controlled, taking all meds as prescribed    Obesity- has lost 10lbs since April, she has been doing somewhat of a liquid diet has broth meat, will have at least 1 regular meal with protein, feels appetite changes with her thyroid , she still snacks on protein       Review Past Medical/Family/Social: Per EMR    Risk Factors  Current exercise habits: walks Dietary issues discussed: Yes  Cardiac risk factors: Obesity (BMI >= 30 kg/m2). DM, HTN  Depression Screen  (Note: if answer to either of the following is "Yes", a more complete depression screening is indicated)  Over the past two weeks, have you felt down, depressed or hopeless? No Over the past two weeks, have you felt little interest or pleasure in doing things? No Have you lost interest or pleasure in daily life? No Do you often feel hopeless? No Do you cry easily over simple problems? No   Activities of Daily Living  In your present state of health, do you have any difficulty performing the following activities?:  Driving? No  Managing money? No  Feeding yourself? No  Getting from bed to chair? No  Climbing a flight of stairs? No  Preparing food and eating?: No  Bathing or showering? No  Getting dressed: No  Getting to the toilet? No  Using the toilet:No  Moving around from place to place: No  In the past year have you fallen or had a near fall?:Yes  Are you sexually active? No  Do you have more than one  partner? No   Hearing Difficulties: No  Do you often ask people to speak up or repeat themselves? No  Do you experience ringing or noises in your ears? No Do you have difficulty understanding soft or whispered voices? No  Do you feel that you have a problem with memory? Yes- forgets simple things like keys  Do you often misplace items? Yes  Do you feel safe at home? Yes  Cognitive Testing  Alert? Yes Normal Appearance?Yes  Oriented to person? Yes Place? Yes  Time? Yes  Recall of three objects? Yes  Can perform simple calculations? Yes  Displays appropriate judgment?Yes  Can read the correct time from a watch face?Yes   List the Names of Other Physician/Practitioners you currently use:   Dr. Brien Few - pain   Dr. Quay Burow - Eye doctor  Dr. Buddy Duty - thyroid   Screening Tests / Date Colonoscopy      UTD                Pneumonia UTD  Mammogram  UTD Influenza Vaccine UTD Tetanus/tdap - not covered Shingles- Due   ROS: GEN- denies fatigue, fever, weight loss,weakness, recent illness HEENT- denies eye drainage, change in vision, nasal discharge, CVS- denies chest pain, palpitations RESP- denies SOB, cough, wheeze ABD- denies N/V, change in stools, abd pain GU- denies dysuria, hematuria, dribbling, incontinence MSK- +  joint pain, muscle aches, injury Neuro- denies headache, dizziness, syncope, seizure activity  Physical:  GEN- NAD, alert and oriented x3 HEENT- PERRL, EOMI, non injected sclera, pink conjunctiva, MMM, oropharynx clear, TM clear bilat, wears glasses  Neck- Supple, no thryomegaly, no bruit  CVS- RRR, soft systolic murmur RESP-CTAB ABD-NABS,soft,NT,ND EXT- No edema Pulses- Radial, DP- 2+    Assessment:    Annual wellness medicare exam   Plan:    During the course of the visit the patient was educated and counseled about appropriate screening and preventive services including:     CAGE/Depression screen neg    fULL Code  HIV screening to be  done  Immunization shingles vaccine tetanus sent to pharmacy to see if covered  Mammogram patient to schedule for September  Diabetes mellitus has been fairly well controlled recheck A1c continue with dietary changes but advised that she does need actual protein and vegetables not to drink liquids only for most of her meals.  Osteoarthritis she has system multiple joint to the in the visit asked about her finger she gets some stiffness and soreness in her hand mostly the thumb.  She can use her topical anti-inflammatories for this.  She is still able to do her regular activities.  Morbid obesity she continues to work on dietary changes and weight loss  Son and daughter are her power of attorney she states   Patient Instructions (the written plan) was given to the patient.  Medicare Attestation  I have personally reviewed:  The patient's medical and social history  Their use of alcohol, tobacco or illicit drugs  Their current medications and supplements  The patient's functional ability including ADLs,fall risks, home safety risks, cognitive, and hearing and visual impairment  Diet and physical activities  Evidence for depression or mood disorders  The patient's weight, height, BMI, and visual acuity have been recorded in the chart. I have made referrals, counseling, and provided education to the patient based on review of the above and I have provided the patient with a written personalized care plan for preventive services.

## 2018-03-11 ENCOUNTER — Other Ambulatory Visit: Payer: Self-pay | Admitting: *Deleted

## 2018-03-11 MED ORDER — HYDROCODONE-ACETAMINOPHEN 5-325 MG PO TABS: 1.0000 | ORAL_TABLET | Freq: Four times a day (QID) | ORAL | 0 refills | Status: DC | PRN

## 2018-03-11 NOTE — Telephone Encounter (Signed)
Received call from patient.   Requested refill on Hydrocodone/APAP.   Ok to refill??  Last office visit 03/10/2018.  Last refill 09/25/2017.

## 2018-03-12 ENCOUNTER — Other Ambulatory Visit: Payer: Self-pay | Admitting: *Deleted

## 2018-03-12 DIAGNOSIS — D649 Anemia, unspecified: Secondary | ICD-10-CM

## 2018-03-13 ENCOUNTER — Encounter: Payer: Self-pay | Admitting: *Deleted

## 2018-03-13 LAB — CBC WITH DIFFERENTIAL/PLATELET
Basophils Absolute: 22 cells/uL (ref 0–200)
Basophils Relative: 0.4 %
EOS PCT: 3.5 %
Eosinophils Absolute: 189 cells/uL (ref 15–500)
HEMATOCRIT: 31.5 % — AB (ref 35.0–45.0)
Hemoglobin: 9.7 g/dL — ABNORMAL LOW (ref 11.7–15.5)
LYMPHS ABS: 1507 {cells}/uL (ref 850–3900)
MCH: 24.2 pg — ABNORMAL LOW (ref 27.0–33.0)
MCHC: 30.8 g/dL — ABNORMAL LOW (ref 32.0–36.0)
MCV: 78.6 fL — ABNORMAL LOW (ref 80.0–100.0)
MONOS PCT: 7.1 %
MPV: 11.4 fL (ref 7.5–12.5)
Neutro Abs: 3299 cells/uL (ref 1500–7800)
Neutrophils Relative %: 61.1 %
PLATELETS: 275 10*3/uL (ref 140–400)
RBC: 4.01 10*6/uL (ref 3.80–5.10)
RDW: 14.6 % (ref 11.0–15.0)
Total Lymphocyte: 27.9 %
WBC mixed population: 383 cells/uL (ref 200–950)
WBC: 5.4 10*3/uL (ref 3.8–10.8)

## 2018-03-13 LAB — IRON,TIBC AND FERRITIN PANEL
%SAT: 16 % (calc) (ref 16–45)
Ferritin: 9 ng/mL — ABNORMAL LOW (ref 16–232)
Iron: 53 ug/dL (ref 45–160)
TIBC: 340 mcg/dL (calc) (ref 250–450)

## 2018-03-13 LAB — COMPREHENSIVE METABOLIC PANEL
AG RATIO: 1.8 (calc) (ref 1.0–2.5)
ALKALINE PHOSPHATASE (APISO): 60 U/L (ref 33–130)
ALT: 8 U/L (ref 6–29)
AST: 9 U/L — ABNORMAL LOW (ref 10–35)
Albumin: 4.1 g/dL (ref 3.6–5.1)
BILIRUBIN TOTAL: 0.4 mg/dL (ref 0.2–1.2)
BUN: 11 mg/dL (ref 7–25)
CALCIUM: 9 mg/dL (ref 8.6–10.4)
CO2: 28 mmol/L (ref 20–32)
Chloride: 105 mmol/L (ref 98–110)
Creat: 0.86 mg/dL (ref 0.50–0.99)
Globulin: 2.3 g/dL (calc) (ref 1.9–3.7)
Glucose, Bld: 104 mg/dL — ABNORMAL HIGH (ref 65–99)
POTASSIUM: 4.2 mmol/L (ref 3.5–5.3)
SODIUM: 144 mmol/L (ref 135–146)
Total Protein: 6.4 g/dL (ref 6.1–8.1)

## 2018-03-13 LAB — HIV ANTIBODY (ROUTINE TESTING W REFLEX): HIV 1&2 Ab, 4th Generation: NONREACTIVE

## 2018-03-13 LAB — TEST AUTHORIZATION

## 2018-03-13 LAB — HEMOGLOBIN A1C
EAG (MMOL/L): 7.4 (calc)
HEMOGLOBIN A1C: 6.3 %{Hb} — AB (ref <5.7)
MEAN PLASMA GLUCOSE: 134 (calc)

## 2018-03-13 LAB — LIPID PANEL
Cholesterol: 144 mg/dL (ref <200)
HDL: 49 mg/dL — ABNORMAL LOW (ref >50)
LDL Cholesterol (Calc): 75 mg/dL (calc)
Non-HDL Cholesterol (Calc): 95 mg/dL (calc) (ref <130)
TRIGLYCERIDES: 122 mg/dL (ref <150)
Total CHOL/HDL Ratio: 2.9 (calc) (ref <5.0)

## 2018-05-30 ENCOUNTER — Other Ambulatory Visit: Payer: Self-pay | Admitting: Family Medicine

## 2018-05-30 DIAGNOSIS — Z1231 Encounter for screening mammogram for malignant neoplasm of breast: Secondary | ICD-10-CM

## 2018-07-11 ENCOUNTER — Ambulatory Visit: Payer: Self-pay

## 2018-07-15 ENCOUNTER — Encounter: Payer: Self-pay | Admitting: Family Medicine

## 2018-07-15 ENCOUNTER — Ambulatory Visit (INDEPENDENT_AMBULATORY_CARE_PROVIDER_SITE_OTHER): Payer: Medicare HMO | Admitting: Family Medicine

## 2018-07-15 ENCOUNTER — Other Ambulatory Visit: Payer: Self-pay

## 2018-07-15 VITALS — BP 128/74 | HR 60 | Temp 98.2°F | Resp 14 | Ht 67.0 in | Wt 317.0 lb

## 2018-07-15 DIAGNOSIS — I10 Essential (primary) hypertension: Secondary | ICD-10-CM

## 2018-07-15 DIAGNOSIS — E1143 Type 2 diabetes mellitus with diabetic autonomic (poly)neuropathy: Secondary | ICD-10-CM

## 2018-07-15 DIAGNOSIS — F5104 Psychophysiologic insomnia: Secondary | ICD-10-CM

## 2018-07-15 DIAGNOSIS — M170 Bilateral primary osteoarthritis of knee: Secondary | ICD-10-CM

## 2018-07-15 DIAGNOSIS — E782 Mixed hyperlipidemia: Secondary | ICD-10-CM

## 2018-07-15 MED ORDER — ZOLPIDEM TARTRATE 5 MG PO TABS: ORAL_TABLET | ORAL | 3 refills | Status: DC

## 2018-07-15 MED ORDER — HYDROCODONE-ACETAMINOPHEN 5-325 MG PO TABS: 1.0000 | ORAL_TABLET | Freq: Four times a day (QID) | ORAL | 0 refills | Status: DC | PRN

## 2018-07-15 NOTE — Assessment & Plan Note (Signed)
Follows with ortho Refilled norco Shoulder injury, but still has good ROM- she will call her orthopedics if it gets worse

## 2018-07-15 NOTE — Patient Instructions (Signed)
F/U 4 months  

## 2018-07-15 NOTE — Assessment & Plan Note (Signed)
Lipids at goal

## 2018-07-15 NOTE — Assessment & Plan Note (Signed)
ambien refilled 

## 2018-07-15 NOTE — Progress Notes (Signed)
   Subjective:    Patient ID: Autumn Carson, female    DOB: [birth year], 62 y.o.   MRN: [id number]  Patient presents for Follow-up (is fasting)   Pt here to f/u chronic medical problems  Medications reviewed   She had a fall when she was on vacation in [place].She fell and hit her shoulder, used OTC creams and rubbing alcohol it is now improved. Did not have to see the orthopedist    Obesity- weight is up 10lbs , She states she is no longer stressing over her husband and daughter , her appetite is back and she has been to a lot of holiday events    DM- CBG 97-115, LAST a1c 6.3%, TAKING metformin without any difficulties    HTN- taking BP meds as prescribed    OA- needs refill on norco, likley will have knee replacement next year        Review Of Systems:  GEN- denies fatigue, fever, weight loss,weakness, recent illness HEENT- denies eye drainage, change in vision, nasal discharge, CVS- denies chest pain, palpitations RESP- denies SOB, cough, wheeze ABD- denies N/V, change in stools, abd pain GU- + joint pain, muscle aches, injury Neuro- denies headache, dizziness, syncope, seizure activity       Objective:    BP 128/74   Pulse 60   Temp 98.2 F (36.8 C) (Oral)   Resp 14   Ht 5\' 7"  (1.702 m)   Wt (!) 317 lb (143.8 kg)   LMP 09/07/2011   SpO2 96%   BMI 49.65 kg/m  GEN- NAD, alert and oriented x3 HEENT- PERRL, EOMI, non injected sclera, pink conjunctiva, MMM, oropharynx clear, TM clear bilat, wears glasses  Neck- Supple, no thryomegaly  CVS- RRR, soft systolic murmur RESP-CTAB ABD-NABS,soft,NT,ND MSK- fair ROM bilat Upper ext, biceps in tact, neg empty can EXT- No edema Pulses- Radial, DP- 2+        Assessment & Plan:      Problem List Items Addressed This Visit      Unprioritized   Chronic insomnia    ambien refilled      Diabetes mellitus, type II ([place])    Has been well controlled, discussed her diet, she has gained back her  weight Admits to eating much more  Cut back calories and sweets No change to metformin Goal A1C < 7%      Relevant Orders   Hemoglobin A1c   Diabetic neuropathy ([place])   Essential hypertension - Primary    Well controlled       Relevant Orders   CBC with Differential/Platelet   Comprehensive metabolic panel   Hyperlipidemia    Lipids at goal      Morbid obesity ([place])   OA (osteoarthritis) of knee    Follows with ortho Refilled norco Shoulder injury, but still has good ROM- she will call her orthopedics if it gets worse      Relevant Medications   HYDROcodone-acetaminophen ([place]) 5-325 MG tablet      Note: This dictation was prepared with Dragon dictation along with smaller phrase technology. Any transcriptional errors that result from this process are unintentional.

## 2018-07-15 NOTE — Assessment & Plan Note (Signed)
Has been well controlled, discussed her diet, she has gained back her weight Admits to eating much more  Cut back calories and sweets No change to metformin Goal A1C < 7%

## 2018-07-15 NOTE — Assessment & Plan Note (Signed)
Well controlled 

## 2018-07-16 LAB — COMPREHENSIVE METABOLIC PANEL
AG Ratio: 1.8 (calc) (ref 1.0–2.5)
ALT: 10 U/L (ref 6–29)
AST: 13 U/L (ref 10–35)
Albumin: 4.1 g/dL (ref 3.6–5.1)
Alkaline phosphatase (APISO): 54 U/L (ref 33–130)
BILIRUBIN TOTAL: 0.3 mg/dL (ref 0.2–1.2)
BUN / CREAT RATIO: 15 (calc) (ref 6–22)
BUN: 17 mg/dL (ref 7–25)
CALCIUM: 9.4 mg/dL (ref 8.6–10.4)
CO2: 28 mmol/L (ref 20–32)
Chloride: 104 mmol/L (ref 98–110)
Creat: 1.14 mg/dL — ABNORMAL HIGH (ref 0.50–0.99)
GLUCOSE: 107 mg/dL — AB (ref 65–99)
Globulin: 2.3 g/dL (calc) (ref 1.9–3.7)
Potassium: 4.5 mmol/L (ref 3.5–5.3)
SODIUM: 140 mmol/L (ref 135–146)
TOTAL PROTEIN: 6.4 g/dL (ref 6.1–8.1)

## 2018-07-16 LAB — HEMOGLOBIN A1C
Hgb A1c MFr Bld: 6.1 % of total Hgb — ABNORMAL HIGH (ref <5.7)
Mean Plasma Glucose: 128 (calc)
eAG (mmol/L): 7.1 (calc)

## 2018-07-16 LAB — CBC WITH DIFFERENTIAL/PLATELET
ABSOLUTE MONOCYTES: 382 {cells}/uL (ref 200–950)
BASOS ABS: 29 {cells}/uL (ref 0–200)
Basophils Relative: 0.6 %
EOS ABS: 221 {cells}/uL (ref 15–500)
Eosinophils Relative: 4.5 %
HEMATOCRIT: 32.6 % — AB (ref 35.0–45.0)
HEMOGLOBIN: 9.9 g/dL — AB (ref 11.7–15.5)
LYMPHS ABS: 1450 {cells}/uL (ref 850–3900)
MCH: 23.8 pg — AB (ref 27.0–33.0)
MCHC: 30.4 g/dL — AB (ref 32.0–36.0)
MCV: 78.4 fL — AB (ref 80.0–100.0)
MPV: 12.3 fL (ref 7.5–12.5)
Monocytes Relative: 7.8 %
NEUTROS ABS: 2818 {cells}/uL (ref 1500–7800)
NEUTROS PCT: 57.5 %
Platelets: 263 10*3/uL (ref 140–400)
RBC: 4.16 10*6/uL (ref 3.80–5.10)
RDW: 15.9 % — AB (ref 11.0–15.0)
Total Lymphocyte: 29.6 %
WBC: 4.9 10*3/uL (ref 3.8–10.8)

## 2018-08-20 DIAGNOSIS — H40013 Open angle with borderline findings, low risk, bilateral: Secondary | ICD-10-CM | POA: Diagnosis not present

## 2018-09-22 ENCOUNTER — Other Ambulatory Visit: Payer: Self-pay | Admitting: *Deleted

## 2018-09-22 MED ORDER — GLUCOSE BLOOD VI STRP: ORAL_STRIP | 1 refills | Status: DC

## 2018-09-24 ENCOUNTER — Other Ambulatory Visit: Payer: Self-pay | Admitting: *Deleted

## 2018-09-24 MED ORDER — GLUCOSE BLOOD VI STRP: ORAL_STRIP | 1 refills | Status: DC

## 2018-09-24 MED ORDER — FLUOXETINE HCL 40 MG PO CAPS: ORAL_CAPSULE | ORAL | 1 refills | Status: DC

## 2018-09-24 MED ORDER — SIMVASTATIN 40 MG PO TABS: 40.0000 mg | ORAL_TABLET | Freq: Every day | ORAL | 3 refills | Status: DC

## 2018-09-25 ENCOUNTER — Other Ambulatory Visit: Payer: Self-pay | Admitting: Family Medicine

## 2018-11-14 ENCOUNTER — Ambulatory Visit (INDEPENDENT_AMBULATORY_CARE_PROVIDER_SITE_OTHER): Payer: Medicare HMO | Admitting: Family Medicine

## 2018-11-14 ENCOUNTER — Encounter: Payer: Self-pay | Admitting: Family Medicine

## 2018-11-14 ENCOUNTER — Other Ambulatory Visit: Payer: Self-pay

## 2018-11-14 DIAGNOSIS — M79671 Pain in right foot: Secondary | ICD-10-CM

## 2018-11-14 DIAGNOSIS — M150 Primary generalized (osteo)arthritis: Secondary | ICD-10-CM

## 2018-11-14 DIAGNOSIS — D508 Other iron deficiency anemias: Secondary | ICD-10-CM | POA: Diagnosis not present

## 2018-11-14 DIAGNOSIS — E890 Postprocedural hypothyroidism: Secondary | ICD-10-CM | POA: Diagnosis not present

## 2018-11-14 DIAGNOSIS — M8949 Other hypertrophic osteoarthropathy, multiple sites: Secondary | ICD-10-CM

## 2018-11-14 DIAGNOSIS — I10 Essential (primary) hypertension: Secondary | ICD-10-CM

## 2018-11-14 DIAGNOSIS — E782 Mixed hyperlipidemia: Secondary | ICD-10-CM | POA: Diagnosis not present

## 2018-11-14 DIAGNOSIS — D509 Iron deficiency anemia, unspecified: Secondary | ICD-10-CM | POA: Insufficient documentation

## 2018-11-14 DIAGNOSIS — F5104 Psychophysiologic insomnia: Secondary | ICD-10-CM

## 2018-11-14 DIAGNOSIS — E1143 Type 2 diabetes mellitus with diabetic autonomic (poly)neuropathy: Secondary | ICD-10-CM | POA: Diagnosis not present

## 2018-11-14 DIAGNOSIS — F418 Other specified anxiety disorders: Secondary | ICD-10-CM

## 2018-11-14 DIAGNOSIS — M159 Polyosteoarthritis, unspecified: Secondary | ICD-10-CM

## 2018-11-14 MED ORDER — HYDRALAZINE HCL 10 MG PO TABS: 10.0000 mg | ORAL_TABLET | Freq: Three times a day (TID) | ORAL | 3 refills | Status: DC

## 2018-11-14 MED ORDER — AMLODIPINE BESYLATE 10 MG PO TABS: 10.0000 mg | ORAL_TABLET | Freq: Every day | ORAL | 3 refills | Status: DC

## 2018-11-14 MED ORDER — ZOLPIDEM TARTRATE 5 MG PO TABS: ORAL_TABLET | ORAL | 3 refills | Status: DC

## 2018-11-14 MED ORDER — FLUOXETINE HCL 40 MG PO CAPS: ORAL_CAPSULE | ORAL | 1 refills | Status: DC

## 2018-11-14 MED ORDER — NEBIVOLOL HCL 10 MG PO TABS: ORAL_TABLET | ORAL | 2 refills | Status: DC

## 2018-11-14 MED ORDER — HYDROCODONE-ACETAMINOPHEN 5-325 MG PO TABS: 1.0000 | ORAL_TABLET | Freq: Four times a day (QID) | ORAL | 0 refills | Status: DC | PRN

## 2018-11-14 MED ORDER — GABAPENTIN 300 MG PO CAPS: 300.0000 mg | ORAL_CAPSULE | Freq: Two times a day (BID) | ORAL | 1 refills | Status: DC

## 2018-11-14 MED ORDER — SIMVASTATIN 40 MG PO TABS: 40.0000 mg | ORAL_TABLET | Freq: Every day | ORAL | 3 refills | Status: DC

## 2018-11-14 MED ORDER — METFORMIN HCL 1000 MG PO TABS: ORAL_TABLET | ORAL | 1 refills | Status: DC

## 2018-11-14 NOTE — Progress Notes (Signed)
Virtual Visit via Telephone Note  I connected with Autumn Carson on 11/14/18 at 10:43am  by telephone and verified that I am speaking with the correct person using two identifiers.   Pt location: at home   Physician location: in  Office, [place], Vic Blackbird MD I discussed the limitations, risks, security and privacy concerns of performing an evaluation and management service by telephone and the availability of in person appointments. I also discussed with the patient that there may be a patient responsible charge related to this service. The patient expressed understanding and agreed to proceed.  LVM at  10:12am to return call   History of Present Illness:   Had lab work for endocrinology- Dr. Buddy Duty this morning, has appt next week to f/u results    Walking every other day, but does not have scale  DM- taking metformin 1000mg  twice day, Fasting have been below 120, no hypoglycemia symptoms, no difficulty with medications  HTN-  BP 138/89 yesterday  from congregational nurse/ temp 98.40F  Due to her [place] for food bank , no Chest pain, no SOB   MDD- depression feels good prozac   Reviewed medications: Needs refill bystolic,naproxen,ambien,prozac ( [place])    Needs Hydrocodone for her DDD/OA   Past 2 months has had pain in right foot just below the medical aspect of ankle on the side, and on top - using OTC pain patch and that helps.  Get mild swelling on top, also using rubbing alcohol. No specific injury  Gets pain at night where she gets shooting pains.  During the day she is able to walk normally.    Anemia- now taking iron tablets twice a day recommended on her lab work from December Observations/Objective: Unable to visualize over the telephone no acute distress noted over the phone  Assessment and Plan: Foot swelling- possible arthritis or  possible gout we will try her on Epson salt soaks as well as oral anti-inflammatory  elevating the foot.  If this does not improve she will call us back.  She can also use a topical patches.  Diabetes mellitus her blood sugars sound good her last A1c 6.1% showing good control.  No change her metformin will have her come in in 1 month for her labs.  Hypertension she does not monitor her blood pressure at home in general has been fairly well controlled no changes to medication  Iron def anemia no active bleeding she is taking her iron tablets twice a day we will repeat her iron and ferritin with her labs in 1 month  MDD- continue prozac, no concerns, ambien for sleep    Follow Up Instructions:  1 month for fasting labs    I discussed the assessment and treatment plan with the patient. The patient was provided an opportunity to ask questions and all were answered. The patient agreed with the plan and demonstrated an understanding of the instructions.   The patient was advised to call back or seek an in-person evaluation if the symptoms worsen or if the condition fails to improve as anticipated.  I provided  12 minutes of non-face-to-face time during this encounter. End TIME 10:55  Vic Blackbird, MD

## 2018-11-18 DIAGNOSIS — E890 Postprocedural hypothyroidism: Secondary | ICD-10-CM | POA: Diagnosis not present

## 2018-11-18 DIAGNOSIS — Z85850 Personal history of malignant neoplasm of thyroid: Secondary | ICD-10-CM | POA: Diagnosis not present

## 2018-12-09 ENCOUNTER — Encounter (HOSPITAL_COMMUNITY): Payer: Self-pay | Admitting: *Deleted

## 2018-12-09 ENCOUNTER — Emergency Department (HOSPITAL_COMMUNITY)
Admission: EM | Admit: 2018-12-09 | Discharge: 2018-12-09 | Disposition: A | Discharge status: Home / Self Care | Payer: Medicare HMO | Attending: Emergency Medicine | Admitting: Emergency Medicine

## 2018-12-09 ENCOUNTER — Other Ambulatory Visit: Payer: Self-pay

## 2018-12-09 DIAGNOSIS — M5442 Lumbago with sciatica, left side: Secondary | ICD-10-CM | POA: Diagnosis not present

## 2018-12-09 DIAGNOSIS — I10 Essential (primary) hypertension: Secondary | ICD-10-CM | POA: Diagnosis not present

## 2018-12-09 DIAGNOSIS — Z85850 Personal history of malignant neoplasm of thyroid: Secondary | ICD-10-CM | POA: Diagnosis not present

## 2018-12-09 DIAGNOSIS — G8929 Other chronic pain: Secondary | ICD-10-CM | POA: Diagnosis not present

## 2018-12-09 DIAGNOSIS — E119 Type 2 diabetes mellitus without complications: Secondary | ICD-10-CM | POA: Insufficient documentation

## 2018-12-09 DIAGNOSIS — M545 Low back pain: Secondary | ICD-10-CM | POA: Diagnosis present

## 2018-12-09 DIAGNOSIS — E039 Hypothyroidism, unspecified: Secondary | ICD-10-CM | POA: Diagnosis not present

## 2018-12-09 DIAGNOSIS — Z79899 Other long term (current) drug therapy: Secondary | ICD-10-CM | POA: Insufficient documentation

## 2018-12-09 DIAGNOSIS — Z7984 Long term (current) use of oral hypoglycemic drugs: Secondary | ICD-10-CM | POA: Insufficient documentation

## 2018-12-09 DIAGNOSIS — Z7982 Long term (current) use of aspirin: Secondary | ICD-10-CM | POA: Diagnosis not present

## 2018-12-09 MED ORDER — METHOCARBAMOL 500 MG PO TABS: 500.0000 mg | ORAL_TABLET | Freq: Two times a day (BID) | ORAL | 0 refills | Status: DC

## 2018-12-09 MED ORDER — LIDOCAINE 5 % EX PTCH
1.0000 | MEDICATED_PATCH | CUTANEOUS | Status: DC
  Administered 2018-12-09: 16:00:00 1 via TRANSDERMAL
  Filled 2018-12-09: qty 1

## 2018-12-09 MED ORDER — KETOROLAC TROMETHAMINE 15 MG/ML IJ SOLN
15.0000 mg | Freq: Once | INTRAMUSCULAR | Status: AC
  Administered 2018-12-09: 15 mg via INTRAMUSCULAR
  Filled 2018-12-09: qty 1

## 2018-12-09 NOTE — ED Triage Notes (Signed)
Pt in c/o lower back pain ongoing for the last year since falling, denies new falls, pt denies BM & Bladder incontinence, pt in back brace upon arrival, pt sees Dr. Earnie Larsson and has appt this Thursday with pain getting worse today, A&O x4

## 2018-12-09 NOTE — ED Notes (Signed)
Pt verbalizes understanding of follow up care and medication regimen

## 2018-12-09 NOTE — ED Provider Notes (Signed)
[facility] EMERGENCY DEPARTMENT Provider Note   CSN: [id number] Arrival date & time: 12/09/18  1435    History   Chief Complaint Chief Complaint  Patient presents with  . Back Pain    HPI Autumn Carson is a 63 y.o. female with history of chronic back pain, lumbar spinal stenosis, obesity, history of thyroid cancer, diabetes type 2, degenerative disc disease, lumbar fusion presents today for back pain.  Patient reports that she has had a left lower back pain has been intermittent for 1 year she reports that this pain has worsened over the past 3 months.  She reports a moderate intensity pulling sensation constant worsened with leaning over and without alleviating factors.  Patient has been using tramadol without relief of her symptoms.  Patient reports that when pain is at most severe will occasionally travel down her left leg briefly.  Patient reports that she saw [name] approximately 1 year ago for the same and received a shot in the lower back which relieved her pain.  Patient states that she contacted [name] and has a follow-up appointment with him in 2 days for her back pain.  Patient is requesting medications to bridge her over the next 2 days.  Patient denies any injury/trauma, fall, fever/chills, dysuria/hematuria, bowel/bladder incontinence, urinary retention, numbness weakness or tingling, abdominal pain or any additional concerns today.  She denies change in nature of her back pain from her chronic back pain.    HPI  Past Medical History:  Diagnosis Date  . Anxiety   . Arthritis of both knees   . Back pain   . Cancer ([place])    Thyroid  . Chronic anemia   . Essential hypertension   . GERD (gastroesophageal reflux disease)   . History of thyroid cancer 2011   Papillary thyroid cancer s/p radiation  . Hypothyroidism   . Lumbar spinal stenosis   . Obesity   . Seasonal allergies   . Type 2 diabetes mellitus with diabetic neuropathy [facility])      Patient Active Problem List   Diagnosis Date Noted  . Iron deficiency anemia 11/14/2018  . Chronic insomnia 07/15/2018  . Dysphagia, idiopathic 10/27/2015  . OA (osteoarthritis) of knee 06/23/2014  . Knee pain, right 09/16/2013  . Spinal stenosis, lumbar region, with neurogenic claudication 04/07/2013  . Hypothyroidism 02/10/2013  . Morbid obesity ([place]) 02/10/2013  . DDD (degenerative disc disease), lumbar 12/16/2012  . FH: colon cancer 11/26/2012  . Chronic anemia 11/26/2012  . Unspecified constipation 11/26/2012  . Depression with anxiety 07/29/2012  . Diabetic neuropathy ([place]) 06/12/2012  . Leg swelling 06/12/2012  . GERD 04/12/2009  . Diabetes mellitus, type II ([place]) 01/13/2009  . Hyperlipidemia 01/13/2009  . Essential hypertension 01/13/2009  . Osteoarthritis 01/13/2009    Past Surgical History:  Procedure Laterality Date  . BIOPSY N/A 01/16/2013   Procedure: BIOPSY;  Surgeon: Danie Binder, MD;  Location: [facility];  Service: Endoscopy;  Laterality: N/A;  SMALL BOWEL ULCERS  . BRAVO [place] STUDY N/A 11/22/2015   Procedure: BRAVO [place];  Surgeon: Danie Binder, MD;  Location: [facility] ENDO SUITE;  Service: Endoscopy;  Laterality: N/A;  . CARPAL TUNNEL RELEASE     4 total, bilaterally  . CESAREAN SECTION    . CHOLECYSTECTOMY  1990's  . COLONOSCOPY WITH ESOPHAGOGASTRODUODENOSCOPY (EGD) N/A 12/08/2012   SLF: 1. Normal mucosa in the terminal ileum 2. mild diverticulosis throughout the entire examined colon. 3. rectal bleeding due to  moderate sized internal hemorrhoids.   . ELBOW SURGERY Bilateral   . ENTEROSCOPY N/A 01/16/2013   Procedure: ENTEROSCOPY;  Surgeon: Danie Binder, MD;  Location: [facility] END[facility];  Service: Endoscopy;  Laterality: N/A;  2:00  . ESOPHAGOGASTRODUODENOSCOPY N/A 11/22/2015   Procedure: ESOPHAGOGASTRODUODENOSCOPY (EGD);  Surgeon: Danie Binder, MD;  Location: [facility] END[facility] SU[facility];  Service: Endoscopy;  Laterality: N/A;  130 - moved to 2:00 - office to notify pt   . GIVENS CAPSULE STUDY N/A 12/31/2012   Procedure: GIVENS CAPSULE STUDY;  Surgeon: Danie Binder, MD;  Location: [facility];  Service: Endoscopy;  Laterality: N/A;  7:30  . ROTATOR CUFF REPAIR Bilateral    bilaterally  . SAVORY DILATION N/A 11/22/2015   Procedure: SAVORY DILATION;  Surgeon: Danie Binder, MD;  Location: [facility];  Service: Endoscopy;  Laterality: N/A;  . SPINE SURGERY     Lumbar Fusion- Dr. Trenton Gammon  . TONSILECTOMY, ADENOIDECTOMY, BILATERAL MYRINGOTOMY AND TUBES  1987  . TOTAL THYROIDECTOMY  2011  . TUBAL LIGATION       OB History   No obstetric history on file.      Home Medications    Prior to Admission medications   Medication Sig Start Date End Date Taking? Authorizing Provider  amLODipine (NORVASC) 10 MG tablet Take 1 tablet (10 mg total) by mouth daily. 11/14/18   Alycia Rossetti, MD  aspirin 81 MG tablet Take 81 mg by mouth every other day.    [provider]  Blood Glucose Monitoring Suppl (TRUE METRIX AIR GLUCOSE METER) w/Device KIT USE AS DIRECTED 11/28/16   Alycia Rossetti, MD  FLUoxetine (PROZAC) 40 MG capsule TAKE 1 CAPSULE EVERY DAY BEFORE BREAKFAST 11/14/18   [place], Modena Nunnery, MD  gabapentin (NEURONTIN) 300 MG capsule Take 1 capsule (300 mg total) by mouth 2 (two) times daily. 11/14/18   Alycia Rossetti, MD  glucose blood test strip CHECK FINGER STICK BLOOD SUGAR TWICE DAILY AS DIRECTED 09/25/18   Alycia Rossetti, MD  hydrALAZINE (APRESOLINE) 10 MG tablet Take 1 tablet (10 mg total) by mouth 3 (three) times daily. 11/14/18   [place], Modena Nunnery, MD  HYDROcodone-acetaminophen (NORCO) 5-325 MG tablet Take 1 tablet by mouth every 6 (six) hours as needed for moderate pain. Chronic Pain. Dx: G89.4 11/14/18   Alycia Rossetti, MD  levothyroxine (SYNTHROID, LEVOTHROID) 150 MCG tablet Take 1 tablet (150 mcg total) by mouth daily before breakfast. 01/17/18   Alycia Rossetti, MD  metFORMIN (GLUCOPHAGE) 1000 MG tablet TAKE 1 TABLET TWICE DAILY IN  THE MORNING  AND IN THE EVENING 11/14/18   [place], Modena Nunnery, MD  methocarbamol (ROBAXIN) 500 MG tablet Take 1 tablet (500 mg total) by mouth 2 (two) times daily. 12/09/18   Nuala Alpha A, PA-C  naproxen (NAPROSYN) 500 MG tablet TAKE 1 TABLET TWICE DAILY WITH MEALS 06/25/16   [place], Modena Nunnery, MD  nebivolol (BYSTOLIC) 10 MG tablet TAKE 1 TABLET(10 MG) BY MOUTH DAILY 11/14/18   Alycia Rossetti, MD  omeprazole (PRILOSEC) 40 MG capsule TAKE 1 CAPSULE TWICE DAILY  30  MINUTES  PRIOR  TO  MEALS 01/17/18   Annitta Needs, NP  simvastatin (ZOCOR) 40 MG tablet Take 1 tablet (40 mg total) by mouth daily. 11/14/18   [place], Modena Nunnery, MD  zolpidem (AMBIEN) 5 MG tablet TAKE 1 TABLET EVERY NIGHT AT BEDTIME AS NEEDED FOR SLEEP 11/14/18   Alycia Rossetti, MD    Family  History Family History  Problem Relation Age of Onset  . Heart disease Mother   . Lung cancer Father        Age 58  . Colon cancer Brother        Age 68  . Heart attack Daughter 36  . Hypertension Daughter   . Other Brother        Agent orange, age 16, cancer  . Breast cancer Sister     Social History Social History   Tobacco Use  . Smoking status: Never Smoker  . Smokeless tobacco: Never Used  Substance Use Topics  . Alcohol use: No    Alcohol/week: 0.0 standard drinks    Comment: Occassional  . Drug use: No     Allergies   Lisinopril   Review of Systems Review of Systems  Constitutional: Negative.  Negative for chills and fever.  Respiratory: Negative.  Negative for shortness of breath.   Cardiovascular: Negative.  Negative for chest pain.  Gastrointestinal: Negative.  Negative for abdominal pain.  Musculoskeletal: Positive for back pain. Negative for neck pain.  Neurological: Negative.  Negative for dizziness, syncope, weakness, numbness and headaches.  All other systems reviewed and are negative.    Physical Exam Updated Vital Signs BP 135/75   Pulse (!) 54   Temp 98.5 F (36.9 C) (Oral)   Resp 16    Ht 5' 7" (1.702 m)   Wt (!) 141.5 kg   LMP 09/07/2011   SpO2 96%   BMI 48.87 kg/m   Physical Exam Constitutional:      General: She is not in acute distress.    Appearance: Normal appearance. She is obese. She is not ill-appearing or diaphoretic.  HENT:     Head: Normocephalic and atraumatic. No raccoon eyes or Battle's sign.     Jaw: There is normal jaw occlusion. No trismus.     Right Ear: Tympanic membrane, ear canal and external ear normal. No hemotympanum.     Left Ear: Tympanic membrane, ear canal and external ear normal. No hemotympanum.     Nose: Nose normal.     Mouth/Throat:     Lips: Pink.     Mouth: Mucous membranes are moist.     Pharynx: Oropharynx is clear. Uvula midline.  Eyes:     General: Vision grossly intact. Gaze aligned appropriately.     Extraocular Movements: Extraocular movements intact.     Conjunctiva/sclera: Conjunctivae normal.     Pupils: Pupils are equal, round, and reactive to light.  Neck:     Musculoskeletal: Normal range of motion and neck supple. No neck rigidity.     Trachea: Trachea and phonation normal. No tracheal tenderness or tracheal deviation.  Cardiovascular:     Rate and Rhythm: Normal rate and regular rhythm.     Pulses:          Radial pulses are 2+ on the right side and 2+ on the left side.       Dorsalis pedis pulses are 2+ on the right side and 2+ on the left side.       Posterior tibial pulses are 2+ on the right side and 2+ on the left side.     Heart sounds: Normal heart sounds.  Pulmonary:     Effort: Pulmonary effort is normal. No respiratory distress.     Breath sounds: Normal breath sounds and air entry. No decreased breath sounds or rhonchi.  Abdominal:     General: Bowel sounds are normal. There  is no distension.     Palpations: Abdomen is soft. There is no pulsatile mass.     Tenderness: There is no abdominal tenderness. There is no right CVA tenderness, left CVA tenderness, guarding or rebound.  Musculoskeletal:        Back:     Comments: No midline C/T/L spinal tenderness to palpation no deformity, crepitus, or step-off noted. No sign of injury to the neck or back.  Reproducible tenderness to palpation of the Left lower back/SI joint muscles.  Positive straight leg raise left.  Feet:     Right foot:     Protective Sensation: 5 sites tested. 5 sites sensed.     Left foot:     Protective Sensation: 5 sites tested. 5 sites sensed.  Skin:    General: Skin is warm and dry.     Capillary Refill: Capillary refill takes less than 2 seconds.  Neurological:     Mental Status: She is alert.     GCS: GCS eye subscore is 4. GCS verbal subscore is 5. GCS motor subscore is 6.     Comments: Speech is clear and goal oriented, follows commands Major Cranial nerves without deficit, no facial droop Normal strength in upper and lower extremities bilaterally including dorsiflexion and plantar flexion, strong and equal grip strength Sensation normal to light and touch Moves extremities without ataxia, coordination intact Slow steady gait No clonus of feet  Psychiatric:        Behavior: Behavior is cooperative.    ED Treatments / Results  Labs (all labs ordered are listed, but only abnormal results are displayed) Labs Reviewed - No data to display  EKG None  Radiology No results found.  Procedures Procedures (including critical care time)  Medications Ordered in ED Medications  lidocaine (LIDODERM) 5 % 1 patch (1 patch Transdermal Patch Applied 12/09/18 1612)  ketorolac (TORADOL) 15 MG/ML injection 15 mg (15 mg Intramuscular Given 12/09/18 1644)     Initial Impression / Assessment and Plan / ED Course  I have reviewed the triage vital signs and the nursing notes.  Pertinent labs & imaging results that were available during my care of the patient were reviewed by me and considered in my medical decision making (see chart for details).    Autumn Carson is a 63 y.o. female presenting  with chronic left-sided lower back pain.  Pain has been present for greater than one year and is described as their typical back pain.  Patient denies history of trauma, fever, IV drug use, night sweats, weight loss, saddle anesthesia, urinary rentention, bowel/bladder incontinence. No neurological deficits and normal neuro exam.   MRI Lumbar spine 10/15/2017:  IMPRESSION: 1. Chronic mild to moderate spinal stenosis at L2-3 due to a combination of hypertrophy of the facet joints and ligamentum flavum, retrolisthesis, and a small broad-based disc bulge. 2. New right far lateral small disc protrusion at L2-3 a slight mass effect upon the L2 nerve lateral to the neural foramen. 3. Chronic severe bilateral facet arthritis at L5-S1 without neural impingement.  Suspect musculoskeletal etiology of patient's pain. Pain is consistently reproducible with palpation of the back musculature. Abdomen soft/nontender and without pulsatile mass. Patient with equal pedal pulses. Doubt spinal epidural abscess, cauda equina or AAA.  Patient does report distant history of thyroid cancer, she has had no change to the nature of her pain and there is no midline spinal tenderness to palpation, low suspicion for bone mets at this time additionally patient with follow-up  in 2 days with neurosurgery, imaging not indicated at this time.  Shared decision making made and patient agrees with pain control, discharge with neurosurgery follow-up in 2 days, does not want imaging at this time.  Patient is ambulatory in the emergency department without assistance. RICE protocol and pain medicine indicated and discussed with patient.   Toradol 58m IM given. Patient denies history of CKD or gastric ulcers/bleeding. Robaxin 5053mBID prescribed. Patient informed to avoid driving or operating heavy machinery while taking muscle relaxer. Lidoderm patch given.  At this time there does not appear to be any evidence of an acute  emergency medical condition and the patient appears stable for discharge with appropriate outpatient follow up. Diagnosis was discussed with patient who verbalizes understanding of care plan and is agreeable to discharge. I have discussed return precautions with patient who verbalizes understanding of return precautions. Patient encouraged to go to her neurosurgery appointment as scheduled in 2 days in addition to following up with your PCP this week. All questions answered.  Note: Portions of this report may have been transcribed using voice recognition software. Every effort was made to ensure accuracy; however, inadvertent computerized transcription errors may still be present. Final Clinical Impressions(s) / ED Diagnoses   Final diagnoses:  Chronic left-sided low back pain with left-sided sciatica    ED Discharge Orders         Ordered    methocarbamol (ROBAXIN) 500 MG tablet  2 times daily     12/09/18 1715           MoGari Crown5/12/20 1722    [place], ChGwenyth AllegraMD 12/09/18 1925

## 2018-12-09 NOTE — Discharge Instructions (Addendum)
You have been diagnosed today with chronic left-sided lower back pain with left-sided sciatica.  At this time there does not appear to be the presence of an emergent medical condition, however there is always the potential for conditions to change. Please read and follow the below instructions.  Please return to the Emergency Department immediately for any new or worsening symptoms. Please be sure to follow up with your Primary Care Provider within one week regarding your visit today; please call their office to schedule an appointment even if you are feeling better for a follow-up visit. Please go to your appointment with [name] on Thursday as you have scheduled. You have been given a medication today called Toradol this is an anti-inflammatory medication/NSAID.  Please avoid further NSAID use including ibuprofen/Motrin or other NSAID-containing medications for the next 2 days.  Please drink plenty of water over the next few days. You may use the muscle relaxer Robaxin as prescribed do not take with other muscle relaxers, narcotics or with tramadol as will make you sleepy.  Do not drive or operate heavy machinery while taking Robaxin as it may make you drowsy.  Get help right away if: You have weakness or numbness in one or both of your legs or feet. You have trouble controlling your bladder or your bowels. You have nausea or vomiting. You have pain in your abdomen. You have shortness of breath or you faint. Get help right away if: You have severe pain, weakness, or numbness. You have difficulty with bladder or bowel control. Any new or worsening symptoms  Please read the additional information packets attached to your discharge summary.  Do not take your medicine if  develop an itchy rash, swelling in your mouth or lips, or difficulty breathing.

## 2018-12-09 NOTE — ED Notes (Signed)
Pt's dtr Lezlie Lye [phone]. Pls contact for update and D/C

## 2018-12-09 NOTE — ED Notes (Signed)
2 month hx of L lower back/flank pain.  Using Tramadol and muscle relaxers without relief.  Denies any urinary sx, no blood or difficulty.  Back surgery 5 years ago, put on brace to help her get up out of bed.  Appt. With MD on Thursday.

## 2018-12-10 DIAGNOSIS — G8929 Other chronic pain: Secondary | ICD-10-CM | POA: Diagnosis not present

## 2018-12-10 DIAGNOSIS — M5442 Lumbago with sciatica, left side: Secondary | ICD-10-CM | POA: Diagnosis not present

## 2018-12-11 DIAGNOSIS — Z6841 Body Mass Index (BMI) 40.0 and over, adult: Secondary | ICD-10-CM | POA: Diagnosis not present

## 2018-12-11 DIAGNOSIS — M48062 Spinal stenosis, lumbar region with neurogenic claudication: Secondary | ICD-10-CM | POA: Diagnosis not present

## 2018-12-15 ENCOUNTER — Other Ambulatory Visit: Payer: Self-pay | Admitting: Family Medicine

## 2018-12-16 ENCOUNTER — Other Ambulatory Visit: Payer: Medicare HMO

## 2018-12-16 DIAGNOSIS — Z85850 Personal history of malignant neoplasm of thyroid: Secondary | ICD-10-CM | POA: Diagnosis not present

## 2018-12-16 DIAGNOSIS — E890 Postprocedural hypothyroidism: Secondary | ICD-10-CM | POA: Diagnosis not present

## 2018-12-18 ENCOUNTER — Other Ambulatory Visit: Payer: Self-pay | Admitting: Neurosurgery

## 2018-12-18 DIAGNOSIS — M48062 Spinal stenosis, lumbar region with neurogenic claudication: Secondary | ICD-10-CM

## 2019-01-13 DIAGNOSIS — E890 Postprocedural hypothyroidism: Secondary | ICD-10-CM | POA: Diagnosis not present

## 2019-01-13 DIAGNOSIS — Z85850 Personal history of malignant neoplasm of thyroid: Secondary | ICD-10-CM | POA: Diagnosis not present

## 2019-01-14 ENCOUNTER — Ambulatory Visit
Admission: RE | Admit: 2019-01-14 | Discharge: 2019-01-14 | Disposition: A | Discharge status: Home / Self Care | Payer: Medicare HMO | Source: Ambulatory Visit | Attending: Neurosurgery | Admitting: Neurosurgery

## 2019-01-14 DIAGNOSIS — M4696 Unspecified inflammatory spondylopathy, lumbar region: Secondary | ICD-10-CM | POA: Diagnosis not present

## 2019-01-14 DIAGNOSIS — M48061 Spinal stenosis, lumbar region without neurogenic claudication: Secondary | ICD-10-CM | POA: Diagnosis not present

## 2019-01-14 DIAGNOSIS — M48062 Spinal stenosis, lumbar region with neurogenic claudication: Secondary | ICD-10-CM

## 2019-01-14 DIAGNOSIS — M5126 Other intervertebral disc displacement, lumbar region: Secondary | ICD-10-CM | POA: Diagnosis not present

## 2019-01-14 MED ORDER — GADOBENATE DIMEGLUMINE 529 MG/ML IV SOLN
20.0000 mL | Freq: Once | INTRAVENOUS | Status: AC | PRN
  Administered 2019-01-14: 20 mL via INTRAVENOUS

## 2019-01-20 ENCOUNTER — Other Ambulatory Visit: Payer: Self-pay

## 2019-01-20 DIAGNOSIS — Z20822 Contact with and (suspected) exposure to covid-19: Secondary | ICD-10-CM

## 2019-01-20 NOTE — Progress Notes (Signed)
lab

## 2019-01-21 DIAGNOSIS — M48062 Spinal stenosis, lumbar region with neurogenic claudication: Secondary | ICD-10-CM | POA: Diagnosis not present

## 2019-01-24 LAB — NOVEL CORONAVIRUS, NAA: SARS-CoV-2, NAA: NOT DETECTED

## 2019-02-19 ENCOUNTER — Other Ambulatory Visit: Payer: Self-pay | Admitting: Family Medicine

## 2019-02-19 DIAGNOSIS — I10 Essential (primary) hypertension: Secondary | ICD-10-CM

## 2019-03-20 ENCOUNTER — Other Ambulatory Visit: Payer: Self-pay | Admitting: Family Medicine

## 2019-03-20 DIAGNOSIS — I10 Essential (primary) hypertension: Secondary | ICD-10-CM

## 2019-03-23 DIAGNOSIS — E890 Postprocedural hypothyroidism: Secondary | ICD-10-CM | POA: Diagnosis not present

## 2019-03-26 ENCOUNTER — Telehealth: Payer: Self-pay | Admitting: Family Medicine

## 2019-03-26 DIAGNOSIS — M25561 Pain in right knee: Secondary | ICD-10-CM | POA: Diagnosis not present

## 2019-03-26 DIAGNOSIS — M25562 Pain in left knee: Secondary | ICD-10-CM | POA: Diagnosis not present

## 2019-03-26 NOTE — Telephone Encounter (Signed)
Patient would like referral to another ortho for 2nd opinion  For her knee pain please call her at [phone]

## 2019-03-26 NOTE — Telephone Encounter (Signed)
Ok to place referral.

## 2019-03-27 NOTE — Telephone Encounter (Signed)
Call placed to patient.   States that she did see ortho yesterday and received steroid injection into R knee. States that MD will not discuss any other options until she looses more weight.   Advised that most ortho do require a specific weight prior to more invasive treatments. States that she will continue to see current ortho if a second opinion would not be any different.   MD to be made aware.

## 2019-03-27 NOTE — Telephone Encounter (Signed)
noted 

## 2019-03-27 NOTE — Telephone Encounter (Signed)
What is the concerns? Has been seeing ortho for years now

## 2019-04-11 DIAGNOSIS — Z20828 Contact with and (suspected) exposure to other viral communicable diseases: Secondary | ICD-10-CM | POA: Diagnosis not present

## 2019-05-25 ENCOUNTER — Other Ambulatory Visit: Payer: Self-pay

## 2019-05-25 ENCOUNTER — Ambulatory Visit (INDEPENDENT_AMBULATORY_CARE_PROVIDER_SITE_OTHER): Payer: Medicare HMO | Admitting: Family Medicine

## 2019-05-25 ENCOUNTER — Encounter: Payer: Self-pay | Admitting: Family Medicine

## 2019-05-25 VITALS — BP 134/62 | HR 64 | Temp 98.1°F | Resp 14 | Ht 67.0 in | Wt 311.0 lb

## 2019-05-25 DIAGNOSIS — M5136 Other intervertebral disc degeneration, lumbar region: Secondary | ICD-10-CM

## 2019-05-25 DIAGNOSIS — E782 Mixed hyperlipidemia: Secondary | ICD-10-CM

## 2019-05-25 DIAGNOSIS — E1140 Type 2 diabetes mellitus with diabetic neuropathy, unspecified: Secondary | ICD-10-CM | POA: Diagnosis not present

## 2019-05-25 DIAGNOSIS — D508 Other iron deficiency anemias: Secondary | ICD-10-CM

## 2019-05-25 DIAGNOSIS — I10 Essential (primary) hypertension: Secondary | ICD-10-CM | POA: Diagnosis not present

## 2019-05-25 DIAGNOSIS — Z6841 Body Mass Index (BMI) 40.0 and over, adult: Secondary | ICD-10-CM | POA: Diagnosis not present

## 2019-05-25 DIAGNOSIS — E1143 Type 2 diabetes mellitus with diabetic autonomic (poly)neuropathy: Secondary | ICD-10-CM | POA: Diagnosis not present

## 2019-05-25 DIAGNOSIS — F5104 Psychophysiologic insomnia: Secondary | ICD-10-CM

## 2019-05-25 DIAGNOSIS — D649 Anemia, unspecified: Secondary | ICD-10-CM

## 2019-05-25 DIAGNOSIS — E038 Other specified hypothyroidism: Secondary | ICD-10-CM

## 2019-05-25 MED ORDER — NAPROXEN 500 MG PO TABS: 500.0000 mg | ORAL_TABLET | Freq: Two times a day (BID) | ORAL | 2 refills | Status: DC

## 2019-05-25 MED ORDER — HYDROCODONE-ACETAMINOPHEN 5-325 MG PO TABS: 1.0000 | ORAL_TABLET | Freq: Four times a day (QID) | ORAL | 0 refills | Status: DC | PRN

## 2019-05-25 MED ORDER — SIMVASTATIN 40 MG PO TABS: 40.0000 mg | ORAL_TABLET | Freq: Every day | ORAL | 3 refills | Status: DC

## 2019-05-25 MED ORDER — DICLOFENAC SODIUM 1 % TD GEL: TRANSDERMAL | 3 refills | Status: DC

## 2019-05-25 MED ORDER — LEVOTHYROXINE SODIUM 175 MCG PO TABS: 175.0000 ug | ORAL_TABLET | Freq: Every day | ORAL | Status: DC

## 2019-05-25 MED ORDER — HYDRALAZINE HCL 10 MG PO TABS: 10.0000 mg | ORAL_TABLET | Freq: Three times a day (TID) | ORAL | 3 refills | Status: DC

## 2019-05-25 MED ORDER — ZOLPIDEM TARTRATE 5 MG PO TABS: ORAL_TABLET | ORAL | 3 refills | Status: DC

## 2019-05-25 NOTE — Assessment & Plan Note (Signed)
She is trying to work on dietary changes has lost some weight since her last visit.

## 2019-05-25 NOTE — Patient Instructions (Signed)
F/U 4 months for wellness visit  

## 2019-05-25 NOTE — Assessment & Plan Note (Signed)
We will recheck her A1c her fasting blood sugar suggest that her A1c is lower than 7%.  We will also check her renal function and her lipid panel.

## 2019-05-25 NOTE — Assessment & Plan Note (Signed)
Continue prn ambien  

## 2019-05-25 NOTE — Assessment & Plan Note (Signed)
Blood pressure is controlled no change in medication. 

## 2019-05-25 NOTE — Progress Notes (Signed)
Subjective:    Patient ID: Autumn Carson, female    DOB: [birth year], 63 y.o.   MRN: [id number]  Patient presents for Follow-up (had A1C and Microalbumin from [facility]- also needs mammo)  Patient here to follow-up chronic medical problems.  Medications and history reviewed her last visit was a telehealth visit back in April. She did not have had labs drawm  Walking every other day , weight down 6lbs since December   DM- taking metformin 1000mg  twice day, Fasting have been below 112-120 , no hypoglycemia symptoms, no difficulty with medications,last A1C 6.1% back in Dec 2019   7.1%- Done in August by [facility]   Urine micro as well   Had visit with [facility] in January   HTN-  Taking meds as prescribed  , no Chest pain, no SOB   MDD- depression feels good prozac   Reviewed medications: Needs refill bystolic,naproxen,ambien,prozac ( [place])    Needs Hydrocodone for her DDD/OA - her knees bother her the most   Chronic anemia- due for repeat CBC, iron levels   Review Of Systems:  GEN- denies fatigue, fever, weight loss,weakness, recent illness HEENT- denies eye drainage, change in vision, nasal discharge, CVS- denies chest pain, palpitations RESP- denies SOB, cough, wheeze ABD- denies N/V, change in stools, abd pain GU- denies dysuria, hematuria, dribbling, incontinence MSK- +joint pain, muscle aches, injury Neuro- denies headache, dizziness, syncope, seizure activity       Objective:    BP 134/62   Pulse 64   Temp 98.1 F (36.7 C) (Temporal)   Resp 14   Ht 5\' 7"  (1.702 m)   Wt (!) 311 lb (141.1 kg)   LMP 09/07/2011   SpO2 96%   BMI 48.71 kg/m  GEN- NAD, alert and oriented x3 HEENT- PERRL, EOMI, non injected sclera, pink conjunctiva, MMM, oropharynx clear Neck- Supple, no thyromegaly CVS- RRR, soft sytolic murmur RESP-CTAB ABD-NABS,soft,NT,ND EXT- No edema Pulses- Radial, DP- 2+        Assessment & Plan:      Problem List Items  Addressed This Visit      Unprioritized   Chronic anemia   Chronic insomnia    Continue prn ambien      DDD (degenerative disc disease), lumbar    We will restart her Naprosyn she also asked about Voltaren gel she can try this and may be able to decrease her daily Naprosyn.  Hydrocodone also for severe pain      Relevant Medications   naproxen (NAPROSYN) 500 MG tablet   HYDROcodone-acetaminophen (NORCO) 5-325 MG tablet   Diabetes mellitus, type II ([place]) - Primary    We will recheck her A1c her fasting blood sugar suggest that her A1c is lower than 7%.  We will also check her renal function and her lipid panel.      Relevant Medications   simvastatin (ZOCOR) 40 MG tablet   Diabetic neuropathy ([facility])   Relevant Medications   simvastatin (ZOCOR) 40 MG tablet   Essential hypertension    Blood pressure is controlled no change in medication.      Relevant Medications   simvastatin (ZOCOR) 40 MG tablet   hydrALAZINE (APRESOLINE) 10 MG tablet   Hyperlipidemia   Relevant Medications   simvastatin (ZOCOR) 40 MG tablet   hydrALAZINE (APRESOLINE) 10 MG tablet   Hypothyroidism    Per endocrinology      Relevant Medications   levothyroxine (SYNTHROID) 175 MCG tablet   Morbid obesity ([place])  She is trying to work on dietary changes has lost some weight since her last visit.       Other Visit Diagnoses    Iron deficiency anemia secondary to inadequate dietary iron intake          Note: This dictation was prepared with Dragon dictation along with smaller phrase technology. Any transcriptional errors that result from this process are unintentional.

## 2019-05-25 NOTE — Assessment & Plan Note (Signed)
We will restart her Naprosyn she also asked about Voltaren gel she can try this and may be able to decrease her daily Naprosyn.  Hydrocodone also for severe pain

## 2019-05-25 NOTE — Assessment & Plan Note (Signed)
Per endocrinology 

## 2019-05-26 LAB — HEMOGLOBIN A1C
Hgb A1c MFr Bld: 6.3 % of total Hgb — ABNORMAL HIGH (ref <5.7)
Mean Plasma Glucose: 134 (calc)
eAG (mmol/L): 7.4 (calc)

## 2019-05-26 LAB — CBC WITH DIFFERENTIAL/PLATELET
Absolute Monocytes: 432 cells/uL (ref 200–950)
Basophils Absolute: 30 cells/uL (ref 0–200)
Basophils Relative: 0.5 %
Eosinophils Absolute: 162 cells/uL (ref 15–500)
Eosinophils Relative: 2.7 %
HCT: 35.7 % (ref 35.0–45.0)
Hemoglobin: 11.5 g/dL — ABNORMAL LOW (ref 11.7–15.5)
Lymphs Abs: 1830 cells/uL (ref 850–3900)
MCH: 25.4 pg — ABNORMAL LOW (ref 27.0–33.0)
MCHC: 32.2 g/dL (ref 32.0–36.0)
MCV: 79 fL — ABNORMAL LOW (ref 80.0–100.0)
MPV: 12.3 fL (ref 7.5–12.5)
Monocytes Relative: 7.2 %
Neutro Abs: 3546 cells/uL (ref 1500–7800)
Neutrophils Relative %: 59.1 %
Platelets: 291 10*3/uL (ref 140–400)
RBC: 4.52 10*6/uL (ref 3.80–5.10)
RDW: 14.9 % (ref 11.0–15.0)
Total Lymphocyte: 30.5 %
WBC: 6 10*3/uL (ref 3.8–10.8)

## 2019-05-26 LAB — MICROALBUMIN / CREATININE URINE RATIO
Creatinine, Urine: 261 mg/dL (ref 20–275)
Microalb Creat Ratio: 90 mcg/mg creat — ABNORMAL HIGH (ref <30)
Microalb, Ur: 23.5 mg/dL

## 2019-05-26 LAB — COMPREHENSIVE METABOLIC PANEL
AG Ratio: 1.8 (calc) (ref 1.0–2.5)
ALT: 12 U/L (ref 6–29)
AST: 14 U/L (ref 10–35)
Albumin: 4.2 g/dL (ref 3.6–5.1)
Alkaline phosphatase (APISO): 66 U/L (ref 37–153)
BUN/Creatinine Ratio: 11 (calc) (ref 6–22)
BUN: 12 mg/dL (ref 7–25)
CO2: 26 mmol/L (ref 20–32)
Calcium: 9 mg/dL (ref 8.6–10.4)
Chloride: 103 mmol/L (ref 98–110)
Creat: 1.1 mg/dL — ABNORMAL HIGH (ref 0.50–0.99)
Globulin: 2.3 g/dL (calc) (ref 1.9–3.7)
Glucose, Bld: 141 mg/dL — ABNORMAL HIGH (ref 65–99)
Potassium: 4.4 mmol/L (ref 3.5–5.3)
Sodium: 140 mmol/L (ref 135–146)
Total Bilirubin: 0.3 mg/dL (ref 0.2–1.2)
Total Protein: 6.5 g/dL (ref 6.1–8.1)

## 2019-05-26 LAB — IRON,TIBC AND FERRITIN PANEL
%SAT: 13 % (calc) — ABNORMAL LOW (ref 16–45)
Ferritin: 13 ng/mL — ABNORMAL LOW (ref 16–288)
Iron: 47 ug/dL (ref 45–160)
TIBC: 351 mcg/dL (calc) (ref 250–450)

## 2019-05-26 LAB — LIPID PANEL
Cholesterol: 190 mg/dL (ref <200)
HDL: 48 mg/dL — ABNORMAL LOW (ref >=50)
LDL Cholesterol (Calc): 106 mg/dL (calc) — ABNORMAL HIGH
Non-HDL Cholesterol (Calc): 142 mg/dL (calc) — ABNORMAL HIGH (ref <130)
Total CHOL/HDL Ratio: 4 (calc) (ref <5.0)
Triglycerides: 251 mg/dL — ABNORMAL HIGH (ref <150)

## 2019-06-01 ENCOUNTER — Ambulatory Visit: Payer: Medicare HMO | Admitting: Family Medicine

## 2019-06-17 ENCOUNTER — Other Ambulatory Visit: Payer: Self-pay

## 2019-06-17 DIAGNOSIS — Z20822 Contact with and (suspected) exposure to covid-19: Secondary | ICD-10-CM

## 2019-06-19 LAB — NOVEL CORONAVIRUS, NAA: SARS-CoV-2, NAA: NOT DETECTED

## 2019-07-25 ENCOUNTER — Other Ambulatory Visit: Payer: Self-pay | Admitting: Family Medicine

## 2019-07-28 ENCOUNTER — Other Ambulatory Visit: Payer: Self-pay

## 2019-07-28 ENCOUNTER — Ambulatory Visit
Admission: RE | Admit: 2019-07-28 | Discharge: 2019-07-28 | Disposition: A | Discharge status: Home / Self Care | Payer: Medicare HMO | Source: Ambulatory Visit | Attending: Family Medicine | Admitting: Family Medicine

## 2019-07-28 DIAGNOSIS — Z1231 Encounter for screening mammogram for malignant neoplasm of breast: Secondary | ICD-10-CM

## 2019-08-05 ENCOUNTER — Other Ambulatory Visit: Payer: Self-pay | Admitting: *Deleted

## 2019-08-05 MED ORDER — OMEPRAZOLE 40 MG PO CPDR: DELAYED_RELEASE_CAPSULE | ORAL | 3 refills | Status: DC

## 2019-09-29 ENCOUNTER — Other Ambulatory Visit: Payer: Self-pay

## 2019-09-29 ENCOUNTER — Encounter: Payer: Self-pay | Admitting: Family Medicine

## 2019-09-29 ENCOUNTER — Ambulatory Visit (INDEPENDENT_AMBULATORY_CARE_PROVIDER_SITE_OTHER): Payer: Medicare HMO | Admitting: Family Medicine

## 2019-09-29 ENCOUNTER — Ambulatory Visit: Payer: Medicare HMO | Attending: Internal Medicine

## 2019-09-29 VITALS — BP 136/80 | HR 80 | Temp 98.1°F | Resp 14 | Ht 67.0 in | Wt 317.0 lb

## 2019-09-29 DIAGNOSIS — F418 Other specified anxiety disorders: Secondary | ICD-10-CM | POA: Diagnosis not present

## 2019-09-29 DIAGNOSIS — Z0000 Encounter for general adult medical examination without abnormal findings: Secondary | ICD-10-CM | POA: Diagnosis not present

## 2019-09-29 DIAGNOSIS — M170 Bilateral primary osteoarthritis of knee: Secondary | ICD-10-CM | POA: Diagnosis not present

## 2019-09-29 DIAGNOSIS — E038 Other specified hypothyroidism: Secondary | ICD-10-CM | POA: Diagnosis not present

## 2019-09-29 DIAGNOSIS — E1143 Type 2 diabetes mellitus with diabetic autonomic (poly)neuropathy: Secondary | ICD-10-CM

## 2019-09-29 DIAGNOSIS — E782 Mixed hyperlipidemia: Secondary | ICD-10-CM

## 2019-09-29 DIAGNOSIS — I10 Essential (primary) hypertension: Secondary | ICD-10-CM | POA: Diagnosis not present

## 2019-09-29 DIAGNOSIS — E1140 Type 2 diabetes mellitus with diabetic neuropathy, unspecified: Secondary | ICD-10-CM | POA: Diagnosis not present

## 2019-09-29 DIAGNOSIS — F5104 Psychophysiologic insomnia: Secondary | ICD-10-CM

## 2019-09-29 DIAGNOSIS — Z23 Encounter for immunization: Secondary | ICD-10-CM

## 2019-09-29 DIAGNOSIS — Z6841 Body Mass Index (BMI) 40.0 and over, adult: Secondary | ICD-10-CM | POA: Diagnosis not present

## 2019-09-29 MED ORDER — SHINGRIX 50 MCG/0.5ML IM SUSR: 0.5000 mL | Freq: Once | INTRAMUSCULAR | 1 refills | Status: AC

## 2019-09-29 MED ORDER — TETANUS-DIPHTHERIA TOXOIDS TD 2-2 LF/0.5ML IM SUSP: 0.5000 mL | Freq: Once | INTRAMUSCULAR | 0 refills | Status: AC

## 2019-09-29 MED ORDER — LEVOTHYROXINE SODIUM 200 MCG PO TABS: 200.0000 ug | ORAL_TABLET | Freq: Every day | ORAL | Status: DC

## 2019-09-29 NOTE — Progress Notes (Signed)
Subjective:   Patient presents for Medicare Annual/Subsequent preventive examination.      DM- taking metformin 1000mg  twice day, Did not brin gmeter today, states highest was  142, this AM  117 , no hypoglycemia symptoms, no difficulty with medications,last A1C 6.3 % back in Oct 2020 . Taking Metformin 1000mg  BID   She has cut out coffee due to the sugar, but drinks hot green tea    HTN- Taking meds as prescribed  , no Chest pain, no SOB   MDD- depression feels good prozac   Reviewed medications: Needs refill bystolic,naproxen,ambien,prozac ( [place])   Needs Hydrocodone for her DDD/OA - her knees bother her the most   Chronic anemia- due for repeat CBC, iron levels   Chronic INsmonia- taking ambien 5mg  at bedtime    Review Past Medical/Family/Social: Per EMR    Risk Factors  Current exercise habits: walks Dietary issues discussed: Yes  Cardiac risk factors: Obesity (BMI >= 30 kg/m2). DM, HTN  Depression Screen  (Note: if answer to either of the following is "Yes", a more complete depression screening is indicated)  Over the past two weeks, have you felt down, depressed or hopeless? No Over the past two weeks, have you felt little interest or pleasure in doing things? No Have you lost interest or pleasure in daily life? No Do you often feel hopeless? No Do you cry easily over simple problems? No   Activities of Daily Living  In your present state of health, do you have any difficulty performing the following activities?:  Driving? No  Managing money? No  Feeding yourself? No  Getting from bed to chair? No  Climbing a flight of stairs? No  Preparing food and eating?: No  Bathing or showering? No  Getting dressed: No  Getting to the toilet? No  Using the toilet:No  Moving around from place to place: No  In the past year have you fallen or had a near fall?:Yes  Are you sexually active? No  Do you have more than one partner? No   Hearing  Difficulties: No  Do you often ask people to speak up or repeat themselves? No  Do you experience ringing or noises in your ears? No Do you have difficulty understanding soft or whispered voices? No  Do you feel that you have a problem with memory? Yes- forgets simple things like keys  Do you often misplace items? Yes  Do you feel safe at home? Yes  Cognitive Testing  Alert? Yes Normal Appearance?Yes  Oriented to person? Yes Place? Yes  Time? Yes  Recall of three objects? Yes  Can perform simple calculations? Yes  Displays appropriate judgment?Yes  Can read the correct time from a watch face?Yes   List the Names of Other Physician/Practitioners you currently use:   Dr. Brien Few - pain   [facility]. [facility]  - Eye doctor  Dr. Buddy Duty - thyroid   Screening Tests / Date Colonoscopy      UTD                Pneumonia UTD  Mammogram  UTD Influenza Vacccine UTD TDAP- Due  Shingles- Due  PAP Smear UTD 2018  COVID Vaccine #1 today   ROS: GEN- denies fatigue, fever, weight loss,weakness, recent illness HEENT- denies eye drainage, change in vision, nasal discharge, CVS- denies chest pain, palpitations RESP- denies SOB, cough, wheeze ABD- denies N/V, change in stools, abd pain GU- denies dysuria, hematuria, dribbling, incontinence MSK- + joint pain,  muscle aches, injury Neuro- denies headache, dizziness, syncope, seizure activity  Physical:  GEN- NAD, alert and oriented x3 vitals reviewed HEENT- PERRL, EOMI, non injected sclera, pink conjunctiva, MMM, oropharynx clear, TM clear bilat, wears glasses  Neck- Supple, no thryomegaly, no bruit  CVS- RRR, soft systolic murmur RESP-CTAB ABD-NABS,soft,NT,ND EXT- No edema Pulses- Radial, DP- 2+    Assessment:    Annual wellness medicare exam   Plan:    During the course of the visit the patient was educated and counseled about appropriate screening and preventive services including:     CAGE/Depression screen  neg    fULL Code  HIV screening to be done  Immunization shingles vaccine tetanus sent to pharmacy to see if covered   Diabetes mellitus  With neuropathy has been fairly well controlled recheck A1c continue with dietary changes , she would benefit from weight loss, cutting down on sugary beverages to start with   Hyperlipidemia recheck lipids, continue zocor   HTN- controlled  MDD/ chronic insomnia- controlled on current meds   Osteoarthritis - prn norco, muscle relaxer, NSAID   Morbid obesity she continues to work on dietary changes and weight loss  Son and daughter are her power of attorney she states  Diet review for nutrition referral? Yes ____ Not Indicated __x__  Patient Instructions (the written plan) was given to the patient.  Medicare Attestation  I have personally reviewed:  The patient's medical and social history  Their use of alcohol, tobacco or illicit drugs  Their current medications and supplements  The patient's functional ability including ADLs,fall risks, home safety risks, cognitive, and hearing and visual impairment  Diet and physical activities  Evidence for depression or mood disorders  The patient's weight, height, BMI, and visual acuity have been recorded in the chart. I have made referrals, counseling, and provided education to the patient based on review of the above and I have provided the patient with a written personalized care plan for preventive services.

## 2019-09-29 NOTE — Addendum Note (Signed)
Addended by: Sheral Flow on: 09/29/2019 10:58 AM   Modules accepted: Orders

## 2019-09-29 NOTE — Patient Instructions (Signed)
F/U 4 months  

## 2019-09-30 LAB — CBC WITH DIFFERENTIAL/PLATELET
Absolute Monocytes: 490 {cells}/uL (ref 200–950)
Basophils Absolute: 30 {cells}/uL (ref 0–200)
Basophils Relative: 0.6 %
Eosinophils Absolute: 200 {cells}/uL (ref 15–500)
Eosinophils Relative: 4 %
HCT: 33.8 % — ABNORMAL LOW (ref 35.0–45.0)
Hemoglobin: 10.8 g/dL — ABNORMAL LOW (ref 11.7–15.5)
Lymphs Abs: 1455 {cells}/uL (ref 850–3900)
MCH: 25.3 pg — ABNORMAL LOW (ref 27.0–33.0)
MCHC: 32 g/dL (ref 32.0–36.0)
MCV: 79.2 fL — ABNORMAL LOW (ref 80.0–100.0)
MPV: 11.9 fL (ref 7.5–12.5)
Monocytes Relative: 9.8 %
Neutro Abs: 2825 {cells}/uL (ref 1500–7800)
Neutrophils Relative %: 56.5 %
Platelets: 233 Thousand/uL (ref 140–400)
RBC: 4.27 Million/uL (ref 3.80–5.10)
RDW: 14.7 % (ref 11.0–15.0)
Total Lymphocyte: 29.1 %
WBC: 5 Thousand/uL (ref 3.8–10.8)

## 2019-09-30 LAB — COMPREHENSIVE METABOLIC PANEL
AG Ratio: 2 (calc) (ref 1.0–2.5)
ALT: 13 U/L (ref 6–29)
AST: 14 U/L (ref 10–35)
Albumin: 4.3 g/dL (ref 3.6–5.1)
Alkaline phosphatase (APISO): 55 U/L (ref 37–153)
BUN: 14 mg/dL (ref 7–25)
CO2: 26 mmol/L (ref 20–32)
Calcium: 8.6 mg/dL (ref 8.6–10.4)
Chloride: 105 mmol/L (ref 98–110)
Creat: 0.9 mg/dL (ref 0.50–0.99)
Globulin: 2.2 g/dL (calc) (ref 1.9–3.7)
Glucose, Bld: 115 mg/dL — ABNORMAL HIGH (ref 65–99)
Potassium: 4.3 mmol/L (ref 3.5–5.3)
Sodium: 141 mmol/L (ref 135–146)
Total Bilirubin: 0.4 mg/dL (ref 0.2–1.2)
Total Protein: 6.5 g/dL (ref 6.1–8.1)

## 2019-09-30 LAB — LIPID PANEL
Cholesterol: 163 mg/dL
HDL: 50 mg/dL
LDL Cholesterol (Calc): 90 mg/dL
Non-HDL Cholesterol (Calc): 113 mg/dL
Total CHOL/HDL Ratio: 3.3 (calc)
Triglycerides: 135 mg/dL

## 2019-09-30 LAB — HEMOGLOBIN A1C
Hgb A1c MFr Bld: 6.4 % of total Hgb — ABNORMAL HIGH (ref <5.7)
Mean Plasma Glucose: 137 (calc)
eAG (mmol/L): 7.6 (calc)

## 2019-10-27 ENCOUNTER — Ambulatory Visit: Payer: Medicare HMO | Attending: Internal Medicine

## 2019-10-27 DIAGNOSIS — Z23 Encounter for immunization: Secondary | ICD-10-CM

## 2019-10-27 NOTE — Progress Notes (Signed)
   Covid-19 Vaccination Clinic  Name:  Autumn Carson    MRN: [id number] DOB: [birth year]  10/27/2019  Autumn Carson was observed post Covid-19 immunization for 15 minutes without incident. She was provided with Vaccine Information Sheet and instruction to access the V-Safe system.   Autumn Carson was instructed to call 911 with any severe reactions post vaccine: Marland Kitchen Difficulty breathing  . Swelling of face and throat  . A fast heartbeat  . A bad rash all over body  . Dizziness and weakness   Immunizations Administered    Name Date Dose VIS Date Route   Moderna COVID-19 Vaccine 10/27/2019 11:43 AM 0.5 mL 06/30/2019 Intramuscular   Manufacturer: Moderna   Lot: [id number]   DelphiPO:[id number]

## 2019-11-13 DIAGNOSIS — E890 Postprocedural hypothyroidism: Secondary | ICD-10-CM | POA: Diagnosis not present

## 2019-11-13 DIAGNOSIS — Z85850 Personal history of malignant neoplasm of thyroid: Secondary | ICD-10-CM | POA: Diagnosis not present

## 2019-11-17 DIAGNOSIS — Z85850 Personal history of malignant neoplasm of thyroid: Secondary | ICD-10-CM | POA: Diagnosis not present

## 2019-11-17 DIAGNOSIS — E890 Postprocedural hypothyroidism: Secondary | ICD-10-CM | POA: Diagnosis not present

## 2019-12-03 ENCOUNTER — Telehealth: Payer: Self-pay | Admitting: Family Medicine

## 2019-12-03 NOTE — Chronic Care Management (AMB) (Signed)
  Chronic Care Management   Note  12/03/2019 Name: Autumn Carson MRN: [id number] DOB: [birth year]  Autumn Carson is a 64 y.o. year old female who is a primary care patient of [place], Modena Nunnery, MD. I reached out to [name] by phone today in response to a referral sent by Ms. Nancy Marus PCP, Buelah Manis Modena Nunnery, MD.   Ms. Leacock was given information about Chronic Care Management services today including:  1. CCM service includes personalized support from designated clinical staff supervised by her physician, including individualized plan of care and coordination with other care providers 2. 24/7 contact phone numbers for assistance for urgent and routine care needs. 3. Service will only be billed when office clinical staff spend 20 minutes or more in a month to coordinate care. 4. Only one practitioner may furnish and bill the service in a calendar month. 5. The patient may stop CCM services at any time (effective at the end of the month) by phone call to the office staff.   Patient agreed to services and verbal consent obtained.   Follow up plan:   [place]

## 2019-12-10 DIAGNOSIS — M4310 Spondylolisthesis, site unspecified: Secondary | ICD-10-CM | POA: Insufficient documentation

## 2019-12-10 DIAGNOSIS — Z981 Arthrodesis status: Secondary | ICD-10-CM | POA: Insufficient documentation

## 2019-12-10 DIAGNOSIS — M5416 Radiculopathy, lumbar region: Secondary | ICD-10-CM | POA: Diagnosis not present

## 2019-12-14 ENCOUNTER — Other Ambulatory Visit: Payer: Self-pay | Admitting: Student

## 2019-12-14 DIAGNOSIS — M5416 Radiculopathy, lumbar region: Secondary | ICD-10-CM

## 2019-12-30 ENCOUNTER — Other Ambulatory Visit: Payer: Self-pay | Admitting: Family Medicine

## 2019-12-30 DIAGNOSIS — E1143 Type 2 diabetes mellitus with diabetic autonomic (poly)neuropathy: Secondary | ICD-10-CM

## 2019-12-30 DIAGNOSIS — E782 Mixed hyperlipidemia: Secondary | ICD-10-CM

## 2019-12-30 DIAGNOSIS — I10 Essential (primary) hypertension: Secondary | ICD-10-CM

## 2020-01-04 NOTE — Chronic Care Management (AMB) (Signed)
Chronic Care Management Pharmacy  Name: Autumn Carson  MRN: [id number] DOB: May 18, 1956  Chief Complaint/ HPI  Autumn Carson,  64 y.o. , female presents for their Initial CCM visit with the clinical pharmacist via telephone.  PCP : Alycia Rossetti, MD  Their chronic conditions include: HTN, GERD, DM, hypothyroidism, hyperlipidemia, depression/anxiety, osteoarthritis of knee.  Office Visits:  09/29/2019 [facility]) - annual visit, fairly well controlled DM with neuropathy, has cut down on sugary beverages, on statin, son and daughter are POA  Consult Visit:none recent   Medications: Outpatient Encounter Medications as of 01/05/2020  Medication Sig  . amLODipine (NORVASC) 10 MG tablet TAKE 1 TABLET EVERY DAY  . aspirin 81 MG tablet Take 81 mg by mouth every other day.  . Blood Glucose Monitoring Suppl (TRUE METRIX AIR GLUCOSE METER) w/Device KIT USE AS DIRECTED  . diclofenac sodium (VOLTAREN) 1 % GEL Apply dime size to knees three times a day as needed  . FLUoxetine (PROZAC) 40 MG capsule TAKE 1 CAPSULE EVERY DAY BEFORE BREAKFAST  . gabapentin (NEURONTIN) 300 MG capsule TAKE 1 CAPSULE TWICE DAILY  . glucose blood test strip CHECK FINGER STICK BLOOD SUGAR TWICE DAILY AS DIRECTED  . hydrALAZINE (APRESOLINE) 10 MG tablet Take 1 tablet (10 mg total) by mouth 3 (three) times daily.  Marland Kitchen HYDROcodone-acetaminophen (NORCO) 5-325 MG tablet Take 1 tablet by mouth every 6 (six) hours as needed for moderate pain. Chronic Pain. Dx: G89.4  . levothyroxine (SYNTHROID) 200 MCG tablet Take 1 tablet (200 mcg total) by mouth daily before breakfast.  . metFORMIN (GLUCOPHAGE) 1000 MG tablet TAKE 1 TABLET TWICE DAILY IN THE MORNING  AND IN THE EVENING  . naproxen (NAPROSYN) 500 MG tablet Take 1 tablet (500 mg total) by mouth 2 (two) times daily with a meal.  . nebivolol (BYSTOLIC) 10 MG tablet TAKE 1 TABLET DAILY  . omeprazole (PRILOSEC) 40 MG capsule TAKE 1 CAPSULE TWICE DAILY  30   MINUTES  PRIOR  TO  MEALS  . simvastatin (ZOCOR) 40 MG tablet Take 1 tablet (40 mg total) by mouth daily.  Marland Kitchen zolpidem (AMBIEN) 5 MG tablet TAKE 1 TABLET EVERY NIGHT AT BEDTIME AS NEEDED FOR SLEEP  . methocarbamol (ROBAXIN) 500 MG tablet Take 1 tablet (500 mg total) by mouth 2 (two) times daily.   No facility-administered encounter medications on file as of 01/05/2020.     Current Diagnosis/Assessment:    Emergency planning/management officer Strain: Low Risk   . Difficulty of Paying Living Expenses: Not very hard     Goals Addressed            This Visit's Progress   . Pharmacy Care Plan:       CARE PLAN ENTRY  Current Barriers:  . Chronic Disease Management support, education, and care coordination needs related to Hypertension, Hyperlipidemia, Diabetes, and Hypothyroidism   Hypertension . Pharmacist Clinical Goal(s): o Over the next 180 days, patient will work with PharmD and providers to achieve BP goal <130/80 . Current regimen:  o Bystolic 19FX o Hydralazine 71m tid o Amlodipine 175m. Interventions: o Comprehensive medication review o Continue current therapy and monitoring plan . Patient self care activities - Over the next 180 days, patient will: o Check BP when feeling symptomatic, document, and provide at future appointments o Ensure daily salt intake < 2300 mg/day o Contact providers with consistent blood pressure readings > 130/80.  Hyperlipidemia . Pharmacist Clinical Goal(s): o Over the next 180 days, patient will work  with PharmD and providers to maintain LDL goal < 100 . Current regimen:  o Simvastatin 19m . Interventions: o Comprehensive medication review . Patient self care activities - Over the next 180 days, patient will: o Continue to take medications as prescribed o Contact PharmD or PCP with any medication related concerns  Diabetes . Pharmacist Clinical Goal(s): o Over the next 180 days, patient will work with PharmD and providers to maintain A1c goal  <7% . Current regimen:  o Metformin 10045mtwice daily . Interventions: o Discussed diabetic diet and current exercise plan o Comprehensive medication review . Patient self care activities - Over the next 180 days, patient will: o Check blood sugar once or twice daily, document, and provide at future appointments o Contact provider with any episodes of hypoglycemia or and consistent fasting blood glucose > 130 mg/dl  Hypothyroidism . Pharmacist Clinical Goal(s) o Over the next 180 days, patient will work with PharmD and providers to optimize medication and minimize symptoms related to hypothyroidism. . Current regimen:  o Levothyroxine 200104m. Interventions: o Comprehensive medication review o Counseled on appropriate administration time \ o Recommend repeat TSH . Patient self care activities - Over the next 180 days, patient will: o Continue to take medication as directed o Attend visit with Dr. DurBuelah Manis July for follow up and updated labs    Initial goal documentation        Diabetes   Recent Relevant Labs: Lab Results  Component Value Date/Time   HGBA1C 6.4 (H) 09/29/2019 10:53 AM   HGBA1C 6.3 (H) 05/25/2019 03:48 PM   HGBA1C 6.2 07/26/2016 12:00 AM   MICROALBUR 23.5 05/25/2019 03:48 PM   MICROALBUR 12.1 11/04/2017 08:43 AM     Checking BG: 2x per Day  Recent FBG Readings: 112, 120  Recent 2hr PP BG readings:  140s-150s  Patient has failed these meds in past: none noted Patient is currently controlled on the following medications: metformin 1000 twice daily  Last diabetic Foot exam:  Lab Results  Component Value Date/Time   HMDIABEYEEXA No Retinopathy 02/14/2018 12:00 AM    Last diabetic Eye exam: No results found for: HMDIABFOOTEX   We discussed: diet and exercise extensively.  Patient has cut back on sugary beverages and making healthy diet choices lately.  She also reports she walks about a mile each night down to her brother's house and back which  has helped her weight and sugar readings.   No reports of hypoglycemia.  Plan  Continue current medications and control with diet and exercise.  Continue to monitor BG once or twice daily.  Contact PharmD or PCP with consistent blood sugar readings outside normal range.  Hyperlipidemia   Lipid Panel     Component Value Date/Time   CHOL 163 09/29/2019 1053   TRIG 135 09/29/2019 1053   HDL 50 09/29/2019 1053   LDLGreenbrier 09/29/2019 1053     The 10-year ASCVD risk score (GoMikey Bussing Jr., et al., 2013) is: 17.7%   Values used to calculate the score:     Age: [age]     Sex: Female     Is Non-Hispanic African American: Yes     Diabetic: Yes     Tobacco smoker: No     Systolic Blood Pressure: 136419Hg     Is BP treated: Yes     HDL Cholesterol: 50 mg/dL     Total Cholesterol: 163 mg/dL   Patient has failed these meds in past: none noted  Patient is currently controlled on the following medications:  . Simvastatin 69m  Lipids WNL at visit in march.  Patient making great diet and exercise lifestyle changes.  Plan  Continue current medications  Hypothyroidism   Lab Results  Component Value Date/Time   TSH 0.304 (L) 04/15/2016 05:19 PM   TSH <0.008 uIU/mL (L) 10/18/2010 02:09 PM   TSH 2.648 11/14/2009 09:52 PM   TSH 2.188 06/09/2008 08:20 PM    Patient has failed these meds in past: none noted Patient is currently controlled on the following medications: levothyroxine 2075m  Patient verbalized appropriate timing of administration.   Plan  Continue current medications.  Due for TSH recheck has visit in July wit Dr. DuBuelah ManisHypertension   Office blood pressures are  BP Readings from Last 3 Encounters:  09/29/19 136/80  05/25/19 134/62  12/09/18 135/75    Patient has failed these meds in the past: lisinopril 4025mPatient checks BP at home when feeling symptomatic  Patient home BP readings are ranging: patient did not have logs but reported all readings are  WNL  Patient is currently controlled on the following medications: Bystolic 93m43XVydralazine 93m64mree times daily, amlodipine 10 mg daily  Slight swelling with amlodipine, however not significant and manageable per patient.  Plan  Continue current medications. Contact PharmD with increased swelling, continue to monitor BP and contact provider with BP > 130/80.     GERD    Patient has failed these meds in past: none noted Patient is currently controlled on the following medications: omeprazole 40mg59mly  Currently taking every other day with minimal symptoms.  Plan  Continue current medications  Vaccines   Reviewed and discussed patient's vaccination history.    Immunization History  Administered Date(s) Administered  . Influenza Split 03/30/2014  . Influenza,inj,Quad PF,6+ Mos 04/17/2016, 05/30/2017, 04/30/2019  . Influenza,trivalent, recombinat, inj, PF 05/08/2015  . Influenza-Unspecified 05/10/2018  . Moderna SARS-COVID-2 Vaccination 09/29/2019, 10/27/2019  . Pneumococcal Polysaccharide-23 05/13/2013    Plan  Recommended patient receive shingles vaccine in office/pharmacy.  Medication Management   . Miscellaneous medications: zolpidem 5mg, 57mhocarbamol 500mg, 66mpentin 300mg,  70mC's: ASA 81mg . [facility]  Phone #  [phone] reports using pill pouch for her bottles method to organize medications and promote adherence. . Patient denies missed doses of medication.   ChristianBeverly MilchClinical Pharmacist [place] [phone]

## 2020-01-05 ENCOUNTER — Ambulatory Visit: Payer: Medicare HMO | Admitting: Pharmacist

## 2020-01-05 ENCOUNTER — Other Ambulatory Visit: Payer: Self-pay

## 2020-01-05 DIAGNOSIS — E1143 Type 2 diabetes mellitus with diabetic autonomic (poly)neuropathy: Secondary | ICD-10-CM

## 2020-01-05 DIAGNOSIS — I10 Essential (primary) hypertension: Secondary | ICD-10-CM

## 2020-01-05 DIAGNOSIS — E038 Other specified hypothyroidism: Secondary | ICD-10-CM

## 2020-01-05 DIAGNOSIS — E782 Mixed hyperlipidemia: Secondary | ICD-10-CM

## 2020-01-05 NOTE — Patient Instructions (Addendum)
Thank you for meeting with me today!  I look forward to working with you to help you meet all of your healthcare goals and answer any questions you may have.  Feel free to contact me anytime!  Visit Information  Goals Addressed            This Visit's Progress   . Pharmacy Care Plan:       CARE PLAN ENTRY  Current Barriers:  . Chronic Disease Management support, education, and care coordination needs related to Hypertension, Hyperlipidemia, Diabetes, and Hypothyroidism   Hypertension . Pharmacist Clinical Goal(s): o Over the next 180 days, patient will work with PharmD and providers to achieve BP goal <130/80 . Current regimen:  o Bystolic 10mg  o Hydralazine 10mg  tid o Amlodipine 10mg  . Interventions: o Comprehensive medication review o Continue current therapy and monitoring plan . Patient self care activities - Over the next 180 days, patient will: o Check BP when feeling symptomatic, document, and provide at future appointments o Ensure daily salt intake < 2300 mg/day o Contact providers with consistent blood pressure readings > 130/80.  Hyperlipidemia . Pharmacist Clinical Goal(s): o Over the next 180 days, patient will work with PharmD and providers to maintain LDL goal < 100 . Current regimen:  o Simvastatin 40mg  . Interventions: o Comprehensive medication review . Patient self care activities - Over the next 180 days, patient will: o Continue to take medications as prescribed o Contact PharmD or PCP with any medication related concerns  Diabetes . Pharmacist Clinical Goal(s): o Over the next 180 days, patient will work with PharmD and providers to maintain A1c goal <7% . Current regimen:  o Metformin 1000mg  twice daily . Interventions: o Discussed diabetic diet and current exercise plan o Comprehensive medication review . Patient self care activities - Over the next 180 days, patient will: o Check blood sugar once or twice daily, document, and provide at  future appointments o Contact provider with any episodes of hypoglycemia or and consistent fasting blood glucose > 130 mg/dl  Hypothyroidism . Pharmacist Clinical Goal(s) o Over the next 180 days, patient will work with PharmD and providers to optimize medication and minimize symptoms related to hypothyroidism. . Current regimen:  o Levothyroxine 253mcg . Interventions: o Comprehensive medication review o Counseled on appropriate administration time \ o Recommend repeat TSH . Patient self care activities - Over the next 180 days, patient will: o Continue to take medication as directed o Attend visit with Dr. Buelah Manis in July for follow up and updated labs    Initial goal documentation        Ms. Farrar-Summers was given information about Chronic Care Management services today including:  1. CCM service includes personalized support from designated clinical staff supervised by her physician, including individualized plan of care and coordination with other care providers 2. 24/7 contact phone numbers for assistance for urgent and routine care needs. 3. Standard insurance, coinsurance, copays and deductibles apply for chronic care management only during months in which we provide at least 20 minutes of these services. Most insurances cover these services at 100%, however patients may be responsible for any copay, coinsurance and/or deductible if applicable. This service may help you avoid the need for more expensive face-to-face services. 4. Only one practitioner may furnish and bill the service in a calendar month. 5. The patient may stop CCM services at any time (effective at the end of the month) by phone call to the office staff.  Patient agreed to  services and verbal consent obtained.   The patient verbalized understanding of instructions provided today and agreed to receive a mailed copy of patient instruction and/or educational materials. Telephone follow up appointment with  pharmacy team member scheduled for: 07/11/2020 @ 12:30 PM Beverly Milch, PharmD Clinical Pharmacist [place] [phone]   Diabetic Neuropathy Diabetic neuropathy refers to nerve damage that is caused by diabetes (diabetes mellitus). Over time, people with diabetes can develop nerve damage throughout the body. There are several types of diabetic neuropathy:  Peripheral neuropathy. This is the most common type of diabetic neuropathy. It causes damage to nerves that carry signals between the spinal cord and other parts of the body (peripheral nerves). This usually affects nerves in the feet and legs first, and may eventually affect the hands and arms. The damage affects the ability to sense touch or temperature.  Autonomic neuropathy. This type causes damage to nerves that control involuntary functions (autonomic nerves). These nerves carry signals that control: ? Heartbeat. ? Body temperature. ? Blood pressure. ? Urination. ? Digestion. ? Sweating. ? Sexual function. ? Response to changing blood sugar (glucose) levels.  Focal neuropathy. This type of nerve damage affects one area of the body, such as an arm, a leg, or the face. The injury may involve one nerve or a small group of nerves. Focal neuropathy can be painful and unpredictable, and occurs most often in older adults with diabetes. This often develops suddenly, but usually improves over time and does not cause long-term problems.  Proximal neuropathy. This type of nerve damage affects the nerves of the thighs, hips, buttocks, or legs. It causes severe pain, weakness, and muscle death (atrophy), usually in the thigh muscles. It is more common among older men and people who have type 2 diabetes. The length of recovery time may vary. What are the causes? Peripheral, autonomic, and focal neuropathies are caused by diabetes that is not well controlled with treatment. The cause of proximal neuropathy is not known,  but it may be caused by inflammation related to uncontrolled blood glucose levels. What are the signs or symptoms? Peripheral neuropathy Peripheral neuropathy develops slowly over time. When the nerves of the feet and legs no longer work, you may experience:  Burning, stabbing, or aching pain in the legs or feet.  Pain or cramping in the legs or feet.  Loss of feeling (numbness) and inability to feel pressure or pain in the feet. This can lead to: ? Thick calluses or sores on areas of constant pressure. ? Ulcers. ? Reduced ability to feel temperature changes.  Foot deformities.  Muscle weakness.  Loss of balance or coordination. Autonomic neuropathy The symptoms of autonomic neuropathy vary depending on which nerves are affected. Symptoms may include:  Problems with digestion, such as: ? Nausea or vomiting. ? Poor appetite. ? Bloating. ? Diarrhea or constipation. ? Trouble swallowing. ? Losing weight without trying to.  Problems with the heart, blood and lungs, such as: ? Dizziness, especially when standing up. ? Fainting. ? Shortness of breath. ? Irregular heartbeat.  Bladder problems, such as: ? Trouble starting or stopping urination. ? Leaking urine. ? Trouble emptying the bladder. ? Urinary tract infections (UTIs).  Problems with other body functions, such as: ? Sweat. You may sweat too much or too little. ? Temperature. You might get hot easily. Or, you might feel cold more than usual. ? Sexual function. Men may not be able to get or maintain an erection. Women may have vaginal  dryness and difficulty with arousal. Focal neuropathy Symptoms affect only one area of the body. Common symptoms include:  Numbness.  Tingling.  Burning pain.  Prickling feeling.  Very sensitive skin.  Weakness.  Inability to move (paralysis).  Muscle twitching.  Muscles getting smaller (wasting).  Poor coordination.  Double or blurred vision. Proximal  neuropathy  Sudden, severe pain in the hip, thigh, or buttocks. Pain may spread from the back into the legs (sciatica).  Pain and numbness in the arms and legs.  Tingling.  Loss of bladder control or bowel control.  Weakness and wasting of thigh muscles.  Difficulty getting up from a seated position.  Abdominal swelling.  Unexplained weight loss. How is this diagnosed? Diagnosis usually involves reviewing your medical history and any symptoms you have. Diagnosis varies depending on the type of neuropathy your health care provider suspects. Peripheral neuropathy Your health care provider will check areas that are affected by your nervous system (neurologic exam), such as your reflexes, how you move, and what you can feel. You may have other tests, such as:  Blood tests.  Removal and examination of fluid that surrounds the spinal cord (lumbar puncture).  CT scan.  MRI.  A test to check the nerves that control muscles (electromyogram, EMG).  Tests of how quickly messages pass through your nerves (nerve conduction velocity tests).  Removal of a small piece of nerve to be examined under a microscope (biopsy). Autonomic neuropathy You may have tests, such as:  Tests to measure your blood pressure and heart rate. This may include monitoring you while you are safely secured to an exam table that moves you from a lying position to an upright position (table tilt test).  Breathing tests to check your lungs.  Tests to check how food moves through the digestive system (gastric emptying tests).  Blood, sweat, or urine tests.  Ultrasound of your bladder.  Spinal fluid tests. Focal neuropathy This condition may be diagnosed with:  A neurologic exam.  CT scan.  MRI.  EMG.  Nerve conduction velocity tests. Proximal neuropathy There is no test to diagnose this type of neuropathy. You may have tests to rule out other possible causes of this type of neuropathy. Tests may  include:  X-rays of your spine and lumbar region.  Lumbar puncture.  MRI. How is this treated? The goal of treatment is to keep nerve damage from getting worse. The most important part of treatment is keeping your blood glucose level and your A1C level within your target range by following your diabetes management plan. Over time, maintaining lower blood glucose levels helps lessen symptoms. In some cases, you may need prescription pain medicine. Follow these instructions at home:  Lifestyle   Do not use any products that contain nicotine or tobacco, such as cigarettes and e-cigarettes. If you need help quitting, ask your health care provider.  Be physically active every day. Include strength training and balance exercises.  Follow a healthy meal plan.  Work with your health care provider to manage your blood pressure. General instructions  Follow your diabetes management plan as directed. ? Check your blood glucose levels as directed by your health care provider. ? Keep your blood glucose in your target range as directed by your health care provider. ? Have your A1C level checked at least two times a year, or as often as told by your health care provider.  Take over the counter and prescription medicines only as told by your health care provider. This  includes insulin and diabetes medicine.  Do not drive or use heavy machinery while taking prescription pain medicines.  Check your skin and feet every day for cuts, bruises, redness, blisters, or sores.  Keep all follow up visits as told by your health care provider. This is important. Contact a health care provider if:  You have burning, stabbing, or aching pain in your legs or feet.  You are unable to feel pressure or pain in your feet.  You develop problems with digestion, such as: ? Nausea. ? Vomiting. ? Bloating. ? Constipation. ? Diarrhea. ? Abdominal pain.  You have difficulty with urination, such as  inability: ? To control when you urinate (incontinence). ? To completely empty the bladder (retention).  You have palpitations.  You feel dizzy, weak, or faint when you stand up. Get help right away if:  You cannot urinate.  You have sudden weakness or loss of coordination.  You have trouble speaking.  You have pain or pressure in your chest.  You have an irregular heart beat.  You have sudden inability to move a part of your body. Summary  Diabetic neuropathy refers to nerve damage that is caused by diabetes. It can affect nerves throughout the entire body, causing numbness and pain in the arms, legs, digestive tract, heart, and other body systems.  Keep your blood glucose level and your blood pressure in your target range, as directed by your health care provider. This can help prevent neuropathy from getting worse.  Check your skin and feet every day for cuts, bruises, redness, blisters, or sores.  Do not use any products that contain nicotine or tobacco, such as cigarettes and e-cigarettes. If you need help quitting, ask your health care provider. This information is not intended to replace advice given to you by your health care provider. Make sure you discuss any questions you have with your health care provider. Document Revised: 08/28/2017 Document Reviewed: 08/20/2016 Elsevier Patient Education  2020 [employer].

## 2020-01-07 ENCOUNTER — Other Ambulatory Visit: Payer: Self-pay | Admitting: Family Medicine

## 2020-01-07 DIAGNOSIS — I10 Essential (primary) hypertension: Secondary | ICD-10-CM

## 2020-01-11 ENCOUNTER — Other Ambulatory Visit: Payer: Self-pay | Admitting: *Deleted

## 2020-01-11 MED ORDER — ZOLPIDEM TARTRATE 5 MG PO TABS: ORAL_TABLET | ORAL | 3 refills | Status: DC

## 2020-01-11 NOTE — Telephone Encounter (Signed)
Ok to refill Ambien to mail order??  Last office visit 01/05/2020.  Last refill 05/25/2019, #3 refills.

## 2020-01-15 ENCOUNTER — Ambulatory Visit
Admission: RE | Admit: 2020-01-15 | Discharge: 2020-01-15 | Disposition: A | Discharge status: Home / Self Care | Payer: Medicare HMO | Source: Ambulatory Visit | Attending: Student | Admitting: Student

## 2020-01-15 DIAGNOSIS — M5442 Lumbago with sciatica, left side: Secondary | ICD-10-CM | POA: Diagnosis not present

## 2020-01-15 DIAGNOSIS — M5416 Radiculopathy, lumbar region: Secondary | ICD-10-CM

## 2020-01-15 MED ORDER — GADOBENATE DIMEGLUMINE 529 MG/ML IV SOLN
20.0000 mL | Freq: Once | INTRAVENOUS | Status: AC | PRN
  Administered 2020-01-15: 20 mL via INTRAVENOUS

## 2020-01-18 DIAGNOSIS — Z85850 Personal history of malignant neoplasm of thyroid: Secondary | ICD-10-CM | POA: Diagnosis not present

## 2020-01-18 DIAGNOSIS — E890 Postprocedural hypothyroidism: Secondary | ICD-10-CM | POA: Diagnosis not present

## 2020-02-02 ENCOUNTER — Ambulatory Visit (INDEPENDENT_AMBULATORY_CARE_PROVIDER_SITE_OTHER): Payer: Medicare HMO | Admitting: Family Medicine

## 2020-02-02 ENCOUNTER — Encounter: Payer: Self-pay | Admitting: Family Medicine

## 2020-02-02 ENCOUNTER — Other Ambulatory Visit: Payer: Self-pay

## 2020-02-02 VITALS — BP 138/86 | HR 68 | Temp 98.1°F | Resp 16 | Ht 67.0 in | Wt 319.0 lb

## 2020-02-02 DIAGNOSIS — E782 Mixed hyperlipidemia: Secondary | ICD-10-CM

## 2020-02-02 DIAGNOSIS — E1143 Type 2 diabetes mellitus with diabetic autonomic (poly)neuropathy: Secondary | ICD-10-CM | POA: Diagnosis not present

## 2020-02-02 DIAGNOSIS — I10 Essential (primary) hypertension: Secondary | ICD-10-CM

## 2020-02-02 DIAGNOSIS — E669 Obesity, unspecified: Secondary | ICD-10-CM

## 2020-02-02 DIAGNOSIS — M48062 Spinal stenosis, lumbar region with neurogenic claudication: Secondary | ICD-10-CM

## 2020-02-02 DIAGNOSIS — D649 Anemia, unspecified: Secondary | ICD-10-CM | POA: Diagnosis not present

## 2020-02-02 MED ORDER — GABAPENTIN 300 MG PO CAPS: 300.0000 mg | ORAL_CAPSULE | Freq: Three times a day (TID) | ORAL | 1 refills | Status: DC

## 2020-02-02 MED ORDER — CELECOXIB 100 MG PO CAPS
100.0000 mg | ORAL_CAPSULE | Freq: Two times a day (BID) | ORAL | 1 refills | Status: DC
Start: 2020-02-02 — End: 2020-06-06

## 2020-02-02 MED ORDER — HYDROCODONE-ACETAMINOPHEN 5-325 MG PO TABS: 1.0000 | ORAL_TABLET | Freq: Four times a day (QID) | ORAL | 0 refills | Status: DC | PRN

## 2020-02-02 NOTE — Patient Instructions (Signed)
F/U 4 months  

## 2020-02-02 NOTE — Assessment & Plan Note (Signed)
Advised she can try D/C Naprosyn and see if celebrex works better norco refilled  F/usurgeon for next steps

## 2020-02-02 NOTE — Assessment & Plan Note (Signed)
Taking gabapentin but also using for spinal stenosis back pain

## 2020-02-02 NOTE — Assessment & Plan Note (Signed)
Likely needs iron replacement, recheck levels Hb No abnormal bleeding

## 2020-02-02 NOTE — Progress Notes (Signed)
Subjective:    Patient ID: Autumn Carson, female    DOB: 1956/06/07, 64 y.o.   MRN: [id number]  Patient presents for Follow-up (is fasting) and LLE Pain (x months- S/P fall numbness and tingling from hip through foot)  Pt here to f/u chronic medical problems  Medications reviewed  She has chronic back pain with back surgery- with known DDD, she had a fall with pain radiating down left leg and tingling numbness and burning sensation in her legs SHe cant get comfortable at night  She was evaluated by her back specilaist - Dr. Trenton Gammon , had MRI  She was prescribed celebrex and advised to stop the naprosyn, she has not tried it yet   She has been trying to walk more She is on gabapentin 300mg  TID - she increased a couple months ago   also on Norco 5-325 every 6 hours prn     DM- taking metformin 1000mg  twice day,  last A1C 6.4% in March  did not bring meter, denies any hypoglycemia symptoms   LDL at goal in March at 62 States she typically eats tuna for meat and veggies, CBG on average less than 130  she does try to ride her stationary bike a few minutes a day   HTN-Taking meds as prescribed, no Chest pain, no SOB   MDD- depression feels good prozac     Chronic anemia- due for repeat CBC, she has not been taking iron tablets  Chronic INsmonia- taking ambien 5mg  at bedtime   Review Of Systems:  GEN- denies fatigue, fever, weight loss,weakness, recent illness HEENT- denies eye drainage, change in vision, nasal discharge, CVS- denies chest pain, palpitations RESP- denies SOB, cough, wheeze ABD- denies N/V, change in stools, abd pain GU- denies dysuria, hematuria, dribbling, incontinence MSK- + joint pain, muscle aches, injury Neuro- denies headache, dizziness, syncope, seizure activity       Objective:    BP 138/86   Pulse 68   Temp 98.1 F (36.7 C) (Temporal)   Resp 16   Ht 5\' 7"  (1.702 m)   Wt (!) 319 lb (144.7 kg)   LMP 09/07/2011   SpO2 93%    BMI 49.96 kg/m  GEN- NAD, alert and oriented x3 , examined in chair  HEENT- PERRL, EOMI, non injected sclera, pink conjunctiva, MMM, oropharynx clear Neck- Supple, no thyromegaly CVS- RRR, systolic murmur  RESP-CTAB ABD-NABS,soft,NT,ND Psych- normal affect and mood EXT- No edema Pulses- Radial, DP- 2+        Assessment & Plan:      Problem List Items Addressed This Visit      Unprioritized   Chronic anemia    Likely needs iron replacement, recheck levels Hb No abnormal bleeding       Relevant Orders   Iron, TIBC and Ferritin Panel   Class 3 obesity   Diabetes mellitus, type II (HCC)    Goal is A1C less than 7%, she has been at goal last few checks Continue MTF       Relevant Orders   Hemoglobin A1c   Diabetic neuropathy (HCC)    Taking gabapentin but also using for spinal stenosis back pain      Essential hypertension - Primary    Mildly elevated today No change to meds Increased pain from back as well       Relevant Orders   Comprehensive metabolic panel   CBC with Differential/Platelet   Hyperlipidemia   Spinal stenosis, lumbar region, with neurogenic claudication  Advised she can try D/C Naprosyn and see if celebrex works better norco refilled  F/usurgeon for next steps      Relevant Medications   HYDROcodone-acetaminophen (NORCO) 5-325 MG tablet   celecoxib (CELEBREX) 100 MG capsule   gabapentin (NEURONTIN) 300 MG capsule      Note: This dictation was prepared with Dragon dictation along with smaller phrase technology. Any transcriptional errors that result from this process are unintentional.

## 2020-02-02 NOTE — Assessment & Plan Note (Addendum)
Mildly elevated today No change to meds Increased pain from back as well

## 2020-02-02 NOTE — Assessment & Plan Note (Signed)
Goal is A1C less than 7%, she has been at goal last few checks Continue MTF

## 2020-02-03 DIAGNOSIS — Z981 Arthrodesis status: Secondary | ICD-10-CM | POA: Diagnosis not present

## 2020-02-03 DIAGNOSIS — M48062 Spinal stenosis, lumbar region with neurogenic claudication: Secondary | ICD-10-CM | POA: Diagnosis not present

## 2020-02-03 DIAGNOSIS — I10 Essential (primary) hypertension: Secondary | ICD-10-CM | POA: Diagnosis not present

## 2020-02-03 DIAGNOSIS — Z6841 Body Mass Index (BMI) 40.0 and over, adult: Secondary | ICD-10-CM | POA: Diagnosis not present

## 2020-02-03 LAB — CBC WITH DIFFERENTIAL/PLATELET
Absolute Monocytes: 421 cells/uL (ref 200–950)
Basophils Absolute: 39 cells/uL (ref 0–200)
Basophils Relative: 0.8 %
Eosinophils Absolute: 240 cells/uL (ref 15–500)
Eosinophils Relative: 4.9 %
HCT: 32.9 % — ABNORMAL LOW (ref 35.0–45.0)
Hemoglobin: 10.3 g/dL — ABNORMAL LOW (ref 11.7–15.5)
Lymphs Abs: 1323 cells/uL (ref 850–3900)
MCH: 25.2 pg — ABNORMAL LOW (ref 27.0–33.0)
MCHC: 31.3 g/dL — ABNORMAL LOW (ref 32.0–36.0)
MCV: 80.6 fL (ref 80.0–100.0)
MPV: 12.2 fL (ref 7.5–12.5)
Monocytes Relative: 8.6 %
Neutro Abs: 2876 cells/uL (ref 1500–7800)
Neutrophils Relative %: 58.7 %
Platelets: 239 10*3/uL (ref 140–400)
RBC: 4.08 10*6/uL (ref 3.80–5.10)
RDW: 15.1 % — ABNORMAL HIGH (ref 11.0–15.0)
Total Lymphocyte: 27 %
WBC: 4.9 10*3/uL (ref 3.8–10.8)

## 2020-02-03 LAB — COMPREHENSIVE METABOLIC PANEL
AG Ratio: 2 (calc) (ref 1.0–2.5)
ALT: 11 U/L (ref 6–29)
AST: 13 U/L (ref 10–35)
Albumin: 4.2 g/dL (ref 3.6–5.1)
Alkaline phosphatase (APISO): 66 U/L (ref 37–153)
BUN: 10 mg/dL (ref 7–25)
CO2: 27 mmol/L (ref 20–32)
Calcium: 8.5 mg/dL — ABNORMAL LOW (ref 8.6–10.4)
Chloride: 104 mmol/L (ref 98–110)
Creat: 0.92 mg/dL (ref 0.50–0.99)
Globulin: 2.1 g/dL (calc) (ref 1.9–3.7)
Glucose, Bld: 114 mg/dL — ABNORMAL HIGH (ref 65–99)
Potassium: 4.5 mmol/L (ref 3.5–5.3)
Sodium: 139 mmol/L (ref 135–146)
Total Bilirubin: 0.4 mg/dL (ref 0.2–1.2)
Total Protein: 6.3 g/dL (ref 6.1–8.1)

## 2020-02-03 LAB — HEMOGLOBIN A1C
Hgb A1c MFr Bld: 6.2 % of total Hgb — ABNORMAL HIGH (ref <5.7)
Mean Plasma Glucose: 131 (calc)
eAG (mmol/L): 7.3 (calc)

## 2020-02-03 LAB — IRON,TIBC AND FERRITIN PANEL
%SAT: 14 % (calc) — ABNORMAL LOW (ref 16–45)
Ferritin: 11 ng/mL — ABNORMAL LOW (ref 16–288)
Iron: 48 ug/dL (ref 45–160)
TIBC: 341 mcg/dL (calc) (ref 250–450)

## 2020-02-04 ENCOUNTER — Other Ambulatory Visit: Payer: Self-pay | Admitting: *Deleted

## 2020-02-04 ENCOUNTER — Other Ambulatory Visit: Payer: Self-pay | Admitting: Neurosurgery

## 2020-02-04 MED ORDER — FERROUS SULFATE 325 (65 FE) MG PO TBEC: 325.0000 mg | DELAYED_RELEASE_TABLET | Freq: Two times a day (BID) | ORAL | 3 refills | Status: DC

## 2020-02-09 NOTE — Progress Notes (Signed)
Your procedure is scheduled on Friday July 16.  Report to [facility] Main Entrance "A" at 10:00 A.M., and check in at the Admitting office.  Call this number if you have problems the morning of surgery: [phone]  Call [phone] if you have any questions prior to your surgery date Monday-Friday 8am-4pm   Remember: Do not eat or drink after midnight the night before your surgery  Take these medicines the morning of surgery with A SIP OF WATER: amLODipine (NORVASC) celecoxib (CELEBREX)  FLUoxetine (PROZAC)  gabapentin (NEURONTIN)  hydrALAZINE (APRESOLINE)  levothyroxine (SYNTHROID) nebivolol (BYSTOLIC) omeprazole (PRILOSEC)  simvastatin (ZOCOR)  IF NEEDED: HYDROcodone-acetaminophen (NORCO)   As of today, STOP taking any diclofenac sodium (VOLTAREN) gel, Aspirin (unless otherwise instructed by your surgeon), Aleve, Naproxen, Ibuprofen, Motrin, Advil, Goody's, BC's, all herbal medications, fish oil, and all vitamins.    WHAT DO I DO ABOUT MY DIABETES MEDICATION?    . THE MORNING OF SURGERY --- Do not take oral diabetes medicines (pills) the morning of surgery including metFORMIN (GLUCOPHAGE)   . The day of surgery, do not take other diabetes injectables, including Byetta (exenatide), Bydureon (exenatide ER), Victoza (liraglutide), or Trulicity (dulaglutide).  . If your CBG is greater than 220 mg/dL, you may take  of your sliding scale (correction) dose of insulin.   HOW TO MANAGE YOUR DIABETES BEFORE AND AFTER SURGERY  Why is it important to control my blood sugar before and after surgery? . Improving blood sugar levels before and after surgery helps healing and can limit problems. . A way of improving blood sugar control is eating a healthy diet by: o  Eating less sugar and carbohydrates o  Increasing activity/exercise o  Talking with your doctor about reaching your blood sugar goals . High blood sugars (greater than 180 mg/dL) can raise your risk of infections  and slow your recovery, so you will need to focus on controlling your diabetes during the weeks before surgery. . Make sure that the doctor who takes care of your diabetes knows about your planned surgery including the date and location.  How do I manage my blood sugar before surgery? . Check your blood sugar at least 4 times a day, starting 2 days before surgery, to make sure that the level is not too high or low. . Check your blood sugar the morning of your surgery when you wake up and every 2 hours until you get to the Short Stay unit. o If your blood sugar is less than 70 mg/dL, you will need to treat for low blood sugar: - Do not take insulin. - Treat a low blood sugar (less than 70 mg/dL) with  cup of clear juice (cranberry or apple), 4 glucose tablets, OR glucose gel. - Recheck blood sugar in 15 minutes after treatment (to make sure it is greater than 70 mg/dL). If your blood sugar is not greater than 70 mg/dL on recheck, call [phone] for further instructions. . Report your blood sugar to the short stay nurse when you get to Short Stay.  . If you are admitted to the hospital after surgery: o Your blood sugar will be checked by the staff and you will probably be given insulin after surgery (instead of oral diabetes medicines) to make sure you have good blood sugar levels. o The goal for blood sugar control after surgery is 80-180 mg/dL.   The Morning of Surgery  Do not wear jewelry, make-up or nail polish.  Do not wear lotions, powders, or  perfumes, or deodorant  Do not shave 48 hours prior to surgery.    Do not bring valuables to the hospital.  [facility] is not responsible for any belongings or valuables.  If you are a smoker, DO NOT Smoke 24 hours prior to surgery  If you wear a CPAP at night please bring your mask the morning of surgery   Remember that you must have someone to transport you home after your surgery, and remain with you for 24 hours if you are discharged the  same day.   Please bring cases for contacts, glasses, hearing aids, dentures or bridgework because it cannot be worn into surgery.    Leave your suitcase in the car.  After surgery it may be brought to your room.  For patients admitted to the hospital, discharge time will be determined by your treatment team.  Patients discharged the day of surgery will not be allowed to drive home.    Special instructions:   [facility]- Preparing For Surgery  Before surgery, you can play an important role. Because skin is not sterile, your skin needs to be as free of germs as possible. You can reduce the number of germs on your skin by washing with CHG (chlorahexidine gluconate) Soap before surgery.  CHG is an antiseptic cleaner which kills germs and bonds with the skin to continue killing germs even after washing.    Oral Hygiene is also important to reduce your risk of infection.  Remember - BRUSH YOUR TEETH THE MORNING OF SURGERY WITH YOUR REGULAR TOOTHPASTE  Please do not use if you have an allergy to CHG or antibacterial soaps. If your skin becomes reddened/irritated stop using the CHG.  Do not shave (including legs and underarms) for at least 48 hours prior to first CHG shower. It is OK to shave your face.  Please follow these instructions carefully.   1. Shower the NIGHT BEFORE SURGERY and the MORNING OF SURGERY with CHG Soap.   2. If you chose to wash your hair and body, wash as usual with your normal shampoo and body-wash/soap.  3. Rinse your hair and body thoroughly to remove the shampoo and soap.  4. Apply CHG directly to the skin (ONLY FROM THE NECK DOWN) and wash gently with a scrungie or a clean washcloth.   5. Do not use on open wounds or open sores. Avoid contact with your eyes, ears, mouth and genitals (private parts). Wash Face and genitals (private parts)  with your normal soap.   6. Wash thoroughly, paying special attention to the area where your surgery will be  performed.  7. Thoroughly rinse your body with warm water from the neck down.  8. DO NOT shower/wash with your normal soap after using and rinsing off the CHG Soap.  9. Pat yourself dry with a CLEAN TOWEL.  10. Wear CLEAN PAJAMAS to bed the night before surgery  11. Place CLEAN SHEETS on your bed the night of your first shower and DO NOT SLEEP WITH PETS.  12. Wear comfortable clothes the morning of surgery.     Day of Surgery:  Please shower the morning of surgery with the CHG soap Do not apply any deodorants/lotions. Please wear clean clothes to the hospital/surgery center.   Remember to brush your teeth WITH YOUR REGULAR TOOTHPASTE.   Please read over the following fact sheets that you were given.

## 2020-02-10 ENCOUNTER — Encounter (HOSPITAL_COMMUNITY)
Admission: RE | Admit: 2020-02-10 | Discharge: 2020-02-10 | Disposition: A | Discharge status: Home / Self Care | Payer: Medicare HMO | Source: Ambulatory Visit | Attending: Neurosurgery | Admitting: Neurosurgery

## 2020-02-10 ENCOUNTER — Other Ambulatory Visit: Payer: Self-pay

## 2020-02-10 ENCOUNTER — Other Ambulatory Visit (HOSPITAL_COMMUNITY)
Admission: RE | Admit: 2020-02-10 | Discharge: 2020-02-10 | Disposition: A | Discharge status: Home / Self Care | Payer: Medicare HMO | Source: Ambulatory Visit | Attending: Neurosurgery | Admitting: Neurosurgery

## 2020-02-10 ENCOUNTER — Encounter (HOSPITAL_COMMUNITY): Payer: Self-pay

## 2020-02-10 DIAGNOSIS — Z20822 Contact with and (suspected) exposure to covid-19: Secondary | ICD-10-CM | POA: Insufficient documentation

## 2020-02-10 DIAGNOSIS — Z01818 Encounter for other preprocedural examination: Secondary | ICD-10-CM | POA: Diagnosis not present

## 2020-02-10 HISTORY — DX: Other specified postprocedural states: Z98.890

## 2020-02-10 HISTORY — DX: Other specified postprocedural states: R11.2

## 2020-02-10 LAB — CBC WITH DIFFERENTIAL/PLATELET
Abs Immature Granulocytes: 0.01 10*3/uL (ref 0.00–0.07)
Basophils Absolute: 0 10*3/uL (ref 0.0–0.1)
Basophils Relative: 1 %
Eosinophils Absolute: 0.1 10*3/uL (ref 0.0–0.5)
Eosinophils Relative: 3 %
HCT: 36.2 % (ref 36.0–46.0)
Hemoglobin: 11 g/dL — ABNORMAL LOW (ref 12.0–15.0)
Immature Granulocytes: 0 %
Lymphocytes Relative: 25 %
Lymphs Abs: 1.4 10*3/uL (ref 0.7–4.0)
MCH: 25.2 pg — ABNORMAL LOW (ref 26.0–34.0)
MCHC: 30.4 g/dL (ref 30.0–36.0)
MCV: 83 fL (ref 80.0–100.0)
Monocytes Absolute: 0.4 10*3/uL (ref 0.1–1.0)
Monocytes Relative: 8 %
Neutro Abs: 3.4 10*3/uL (ref 1.7–7.7)
Neutrophils Relative %: 63 %
Platelets: 267 10*3/uL (ref 150–400)
RBC: 4.36 MIL/uL (ref 3.87–5.11)
RDW: 16.3 % — ABNORMAL HIGH (ref 11.5–15.5)
WBC: 5.4 10*3/uL (ref 4.0–10.5)
nRBC: 0 % (ref 0.0–0.2)

## 2020-02-10 LAB — TYPE AND SCREEN
ABO/RH(D): AB NEG
Antibody Screen: NEGATIVE

## 2020-02-10 LAB — SURGICAL PCR SCREEN
MRSA, PCR: NEGATIVE
Staphylococcus aureus: POSITIVE — AB

## 2020-02-10 LAB — GLUCOSE, CAPILLARY: Glucose-Capillary: 105 mg/dL — ABNORMAL HIGH (ref 70–99)

## 2020-02-10 LAB — SARS CORONAVIRUS 2 (TAT 6-24 HRS): SARS Coronavirus 2: NEGATIVE

## 2020-02-10 NOTE — Progress Notes (Signed)
PCP - Dr. Hansel Starling with [facility] Cardiologist - Denies  Chest x-ray - Not indicated EKG - 02/10/20 Stress Test - Yes cannot recall date "Awhile back" ECHO - 04/17/16 Cardiac Cath - Denies  Sleep Study - Denies Scored 5 - Stop Bang tool faxed to PCP office  Fasting Blood Sugar - CBG 105 at PAT appt (fasting) Checks Blood Sugar __2___ times a day averages 96-130  Aspirin Instructions: Last dose 81mg  02/02/20  ERAS Protcol -No PRE-SURGERY Ensure or G2-   COVID TEST- 02/10/20  Anesthesia review: No  Patient denies shortness of breath, fever, cough and chest pain at PAT appointment   All instructions explained to the patient, with a verbal understanding of the material. Patient agrees to go over the instructions while at home for a better understanding. Patient also instructed to self quarantine after being tested for COVID-19. The opportunity to ask questions was provided.

## 2020-02-11 MED ORDER — DEXTROSE 5 % IV SOLN
3.0000 g | INTRAVENOUS | Status: AC
  Administered 2020-02-12: 3 g via INTRAVENOUS
  Filled 2020-02-11: qty 3

## 2020-02-12 ENCOUNTER — Observation Stay (HOSPITAL_COMMUNITY)
Admission: RE | Admit: 2020-02-12 | Discharge: 2020-02-13 | Disposition: A | Discharge status: Home / Self Care | Payer: Medicare HMO | Attending: Neurosurgery | Admitting: Neurosurgery

## 2020-02-12 ENCOUNTER — Ambulatory Visit (HOSPITAL_COMMUNITY): Payer: Medicare HMO

## 2020-02-12 ENCOUNTER — Other Ambulatory Visit: Payer: Self-pay

## 2020-02-12 ENCOUNTER — Encounter (HOSPITAL_COMMUNITY): Payer: Self-pay | Admitting: Neurosurgery

## 2020-02-12 ENCOUNTER — Encounter (HOSPITAL_COMMUNITY)
Admission: RE | Disposition: A | Discharge status: Home / Self Care | Payer: Self-pay | Source: Home / Self Care | Attending: Neurosurgery

## 2020-02-12 DIAGNOSIS — E669 Obesity, unspecified: Secondary | ICD-10-CM | POA: Insufficient documentation

## 2020-02-12 DIAGNOSIS — E039 Hypothyroidism, unspecified: Secondary | ICD-10-CM | POA: Insufficient documentation

## 2020-02-12 DIAGNOSIS — M9963 Osseous and subluxation stenosis of intervertebral foramina of lumbar region: Secondary | ICD-10-CM | POA: Diagnosis not present

## 2020-02-12 DIAGNOSIS — F419 Anxiety disorder, unspecified: Secondary | ICD-10-CM | POA: Insufficient documentation

## 2020-02-12 DIAGNOSIS — K219 Gastro-esophageal reflux disease without esophagitis: Secondary | ICD-10-CM | POA: Diagnosis not present

## 2020-02-12 DIAGNOSIS — M4326 Fusion of spine, lumbar region: Secondary | ICD-10-CM | POA: Diagnosis not present

## 2020-02-12 DIAGNOSIS — M48061 Spinal stenosis, lumbar region without neurogenic claudication: Principal | ICD-10-CM | POA: Insufficient documentation

## 2020-02-12 DIAGNOSIS — Z79899 Other long term (current) drug therapy: Secondary | ICD-10-CM | POA: Insufficient documentation

## 2020-02-12 DIAGNOSIS — Z791 Long term (current) use of non-steroidal anti-inflammatories (NSAID): Secondary | ICD-10-CM | POA: Insufficient documentation

## 2020-02-12 DIAGNOSIS — M5116 Intervertebral disc disorders with radiculopathy, lumbar region: Secondary | ICD-10-CM | POA: Diagnosis not present

## 2020-02-12 DIAGNOSIS — Z7982 Long term (current) use of aspirin: Secondary | ICD-10-CM | POA: Diagnosis not present

## 2020-02-12 DIAGNOSIS — E1140 Type 2 diabetes mellitus with diabetic neuropathy, unspecified: Secondary | ICD-10-CM | POA: Insufficient documentation

## 2020-02-12 DIAGNOSIS — Z419 Encounter for procedure for purposes other than remedying health state, unspecified: Secondary | ICD-10-CM

## 2020-02-12 DIAGNOSIS — Z7984 Long term (current) use of oral hypoglycemic drugs: Secondary | ICD-10-CM | POA: Insufficient documentation

## 2020-02-12 DIAGNOSIS — E1142 Type 2 diabetes mellitus with diabetic polyneuropathy: Secondary | ICD-10-CM | POA: Diagnosis not present

## 2020-02-12 DIAGNOSIS — I10 Essential (primary) hypertension: Secondary | ICD-10-CM | POA: Diagnosis not present

## 2020-02-12 DIAGNOSIS — E785 Hyperlipidemia, unspecified: Secondary | ICD-10-CM | POA: Diagnosis not present

## 2020-02-12 DIAGNOSIS — Z981 Arthrodesis status: Secondary | ICD-10-CM | POA: Diagnosis not present

## 2020-02-12 DIAGNOSIS — M4306 Spondylolysis, lumbar region: Secondary | ICD-10-CM | POA: Diagnosis present

## 2020-02-12 LAB — GLUCOSE, CAPILLARY
Glucose-Capillary: 123 mg/dL — ABNORMAL HIGH (ref 70–99)
Glucose-Capillary: 136 mg/dL — ABNORMAL HIGH (ref 70–99)
Glucose-Capillary: 186 mg/dL — ABNORMAL HIGH (ref 70–99)
Glucose-Capillary: 172 mg/dL — ABNORMAL HIGH (ref 70–99)

## 2020-02-12 LAB — BASIC METABOLIC PANEL
Anion gap: 10 (ref 5–15)
BUN: 10 mg/dL (ref 8–23)
CO2: 24 mmol/L (ref 22–32)
Calcium: 9 mg/dL (ref 8.9–10.3)
Chloride: 106 mmol/L (ref 98–111)
Creatinine, Ser: 0.8 mg/dL (ref 0.44–1.00)
GFR calc Af Amer: >60 mL/min (ref >60)
GFR calc non Af Amer: >60 mL/min (ref >60)
Glucose, Bld: 112 mg/dL — ABNORMAL HIGH (ref 70–99)
Potassium: 4.1 mmol/L (ref 3.5–5.1)
Sodium: 140 mmol/L (ref 135–145)

## 2020-02-12 SURGERY — POSTERIOR LUMBAR FUSION 1 LEVEL
Anesthesia: General | Site: Back

## 2020-02-12 MED ORDER — SODIUM CHLORIDE 0.9% FLUSH: 3.0000 mL | INTRAVENOUS | Status: DC | PRN

## 2020-02-12 MED ORDER — SODIUM CHLORIDE 0.9% FLUSH
3.0000 mL | Freq: Two times a day (BID) | INTRAVENOUS | Status: DC
  Administered 2020-02-12: 3 mL via INTRAVENOUS

## 2020-02-12 MED ORDER — VANCOMYCIN HCL 1000 MG IV SOLR
INTRAVENOUS | Status: AC
  Filled 2020-02-12: qty 1000

## 2020-02-12 MED ORDER — DIAZEPAM 5 MG PO TABS: 5.0000 mg | ORAL_TABLET | Freq: Four times a day (QID) | ORAL | Status: DC | PRN

## 2020-02-12 MED ORDER — SCOPOLAMINE 1 MG/3DAYS TD PT72: 1.0000 | MEDICATED_PATCH | Freq: Once | TRANSDERMAL | Status: DC

## 2020-02-12 MED ORDER — MENTHOL 3 MG MT LOZG: 1.0000 | LOZENGE | OROMUCOSAL | Status: DC | PRN

## 2020-02-12 MED ORDER — PROPOFOL 10 MG/ML IV BOLUS
INTRAVENOUS | Status: AC
  Filled 2020-02-12: qty 20

## 2020-02-12 MED ORDER — CEFAZOLIN SODIUM-DEXTROSE 2-4 GM/100ML-% IV SOLN
2.0000 g | Freq: Three times a day (TID) | INTRAVENOUS | Status: AC
  Administered 2020-02-12 – 2020-02-13 (×2): 2 g via INTRAVENOUS
  Filled 2020-02-12 (×2): qty 100

## 2020-02-12 MED ORDER — ONDANSETRON HCL 4 MG/2ML IJ SOLN
INTRAMUSCULAR | Status: DC | PRN
  Administered 2020-02-12: 4 mg via INTRAVENOUS

## 2020-02-12 MED ORDER — BUPIVACAINE HCL (PF) 0.25 % IJ SOLN
INTRAMUSCULAR | Status: DC | PRN
  Administered 2020-02-12: 30 mL

## 2020-02-12 MED ORDER — MIDAZOLAM HCL 5 MG/5ML IJ SOLN
INTRAMUSCULAR | Status: DC | PRN
  Administered 2020-02-12: 2 mg via INTRAVENOUS

## 2020-02-12 MED ORDER — LIDOCAINE 2% (20 MG/ML) 5 ML SYRINGE
INTRAMUSCULAR | Status: AC
  Filled 2020-02-12: qty 5

## 2020-02-12 MED ORDER — AMLODIPINE BESYLATE 5 MG PO TABS
10.0000 mg | ORAL_TABLET | Freq: Every day | ORAL | Status: DC
  Administered 2020-02-12 – 2020-02-13 (×2): 10 mg via ORAL
  Filled 2020-02-12 (×2): qty 2

## 2020-02-12 MED ORDER — FLUOXETINE HCL 20 MG PO CAPS
40.0000 mg | ORAL_CAPSULE | Freq: Every day | ORAL | Status: DC
  Administered 2020-02-13: 40 mg via ORAL
  Filled 2020-02-12: qty 2

## 2020-02-12 MED ORDER — METFORMIN HCL 500 MG PO TABS
1000.0000 mg | ORAL_TABLET | Freq: Two times a day (BID) | ORAL | Status: DC
  Administered 2020-02-12 – 2020-02-13 (×2): 1000 mg via ORAL
  Filled 2020-02-12 (×2): qty 2

## 2020-02-12 MED ORDER — PROPOFOL 10 MG/ML IV BOLUS
INTRAVENOUS | Status: DC | PRN
  Administered 2020-02-12: 200 mg via INTRAVENOUS

## 2020-02-12 MED ORDER — CHLORHEXIDINE GLUCONATE CLOTH 2 % EX PADS: 6.0000 | MEDICATED_PAD | Freq: Once | CUTANEOUS | Status: DC

## 2020-02-12 MED ORDER — HYDROCODONE-ACETAMINOPHEN 10-325 MG PO TABS: 1.0000 | ORAL_TABLET | ORAL | Status: DC | PRN

## 2020-02-12 MED ORDER — SODIUM CHLORIDE 0.9 % IV SOLN: 250.0000 mL | INTRAVENOUS | Status: DC

## 2020-02-12 MED ORDER — HYDROMORPHONE HCL 1 MG/ML IJ SOLN: 0.2500 mg | INTRAMUSCULAR | Status: DC | PRN

## 2020-02-12 MED ORDER — HYDROCODONE-ACETAMINOPHEN 5-325 MG PO TABS: 1.0000 | ORAL_TABLET | Freq: Four times a day (QID) | ORAL | Status: DC | PRN

## 2020-02-12 MED ORDER — LEVOTHYROXINE SODIUM 75 MCG PO TABS
175.0000 ug | ORAL_TABLET | Freq: Every day | ORAL | Status: DC
  Administered 2020-02-13: 175 ug via ORAL
  Filled 2020-02-12: qty 1

## 2020-02-12 MED ORDER — CHLORHEXIDINE GLUCONATE 0.12 % MT SOLN
OROMUCOSAL | Status: AC
  Administered 2020-02-12: 1 mL
  Filled 2020-02-12: qty 15

## 2020-02-12 MED ORDER — SIMVASTATIN 20 MG PO TABS
40.0000 mg | ORAL_TABLET | Freq: Every day | ORAL | Status: DC
  Administered 2020-02-12: 40 mg via ORAL
  Filled 2020-02-12: qty 2

## 2020-02-12 MED ORDER — PHENOL 1.4 % MT LIQD: 1.0000 | OROMUCOSAL | Status: DC | PRN

## 2020-02-12 MED ORDER — DEXAMETHASONE SODIUM PHOSPHATE 10 MG/ML IJ SOLN
10.0000 mg | Freq: Once | INTRAMUSCULAR | Status: AC
  Administered 2020-02-12: 10 mg via INTRAVENOUS
  Filled 2020-02-12: qty 1

## 2020-02-12 MED ORDER — OXYCODONE HCL 5 MG PO TABS: 5.0000 mg | ORAL_TABLET | Freq: Once | ORAL | Status: DC | PRN

## 2020-02-12 MED ORDER — HYDROMORPHONE HCL 1 MG/ML IJ SOLN: 1.0000 mg | INTRAMUSCULAR | Status: DC | PRN

## 2020-02-12 MED ORDER — LIDOCAINE 2% (20 MG/ML) 5 ML SYRINGE
INTRAMUSCULAR | Status: DC | PRN
  Administered 2020-02-12: 20 mg via INTRAVENOUS

## 2020-02-12 MED ORDER — PANTOPRAZOLE SODIUM 40 MG PO TBEC
80.0000 mg | DELAYED_RELEASE_TABLET | Freq: Every day | ORAL | Status: DC
  Administered 2020-02-13: 80 mg via ORAL
  Filled 2020-02-12: qty 2

## 2020-02-12 MED ORDER — BISACODYL 10 MG RE SUPP: 10.0000 mg | Freq: Every day | RECTAL | Status: DC | PRN

## 2020-02-12 MED ORDER — CELECOXIB 100 MG PO CAPS
100.0000 mg | ORAL_CAPSULE | Freq: Two times a day (BID) | ORAL | Status: DC
  Administered 2020-02-12: 100 mg via ORAL
  Filled 2020-02-12 (×2): qty 1

## 2020-02-12 MED ORDER — SCOPOLAMINE 1 MG/3DAYS TD PT72
MEDICATED_PATCH | TRANSDERMAL | Status: AC
  Administered 2020-02-12: 1.5 mg via TRANSDERMAL
  Filled 2020-02-12: qty 1

## 2020-02-12 MED ORDER — DEXAMETHASONE SODIUM PHOSPHATE 10 MG/ML IJ SOLN
INTRAMUSCULAR | Status: AC
  Filled 2020-02-12: qty 1

## 2020-02-12 MED ORDER — PROMETHAZINE HCL 25 MG/ML IJ SOLN: 6.2500 mg | INTRAMUSCULAR | Status: DC | PRN

## 2020-02-12 MED ORDER — ROCURONIUM BROMIDE 10 MG/ML (PF) SYRINGE
PREFILLED_SYRINGE | INTRAVENOUS | Status: DC | PRN
  Administered 2020-02-12: 60 mg via INTRAVENOUS
  Administered 2020-02-12: 15 mg via INTRAVENOUS
  Administered 2020-02-12: 40 mg via INTRAVENOUS

## 2020-02-12 MED ORDER — PHENYLEPHRINE HCL-NACL 10-0.9 MG/250ML-% IV SOLN
INTRAVENOUS | Status: DC | PRN
  Administered 2020-02-12: 20 ug/min via INTRAVENOUS

## 2020-02-12 MED ORDER — SODIUM CHLORIDE 0.9 % IV SOLN
INTRAVENOUS | Status: DC | PRN
  Administered 2020-02-12: 500 mL

## 2020-02-12 MED ORDER — ACETAMINOPHEN 650 MG RE SUPP: 650.0000 mg | RECTAL | Status: DC | PRN

## 2020-02-12 MED ORDER — HYDRALAZINE HCL 10 MG PO TABS
10.0000 mg | ORAL_TABLET | Freq: Three times a day (TID) | ORAL | Status: DC
  Administered 2020-02-12 – 2020-02-13 (×2): 10 mg via ORAL
  Filled 2020-02-12 (×2): qty 1

## 2020-02-12 MED ORDER — BUPIVACAINE HCL (PF) 0.25 % IJ SOLN
INTRAMUSCULAR | Status: AC
  Filled 2020-02-12: qty 30

## 2020-02-12 MED ORDER — ZOLPIDEM TARTRATE 5 MG PO TABS: 5.0000 mg | ORAL_TABLET | Freq: Every evening | ORAL | Status: DC | PRN

## 2020-02-12 MED ORDER — THROMBIN 20000 UNITS EX SOLR
CUTANEOUS | Status: AC
  Filled 2020-02-12: qty 20000

## 2020-02-12 MED ORDER — POLYETHYLENE GLYCOL 3350 17 G PO PACK: 17.0000 g | PACK | Freq: Every day | ORAL | Status: DC | PRN

## 2020-02-12 MED ORDER — ACETAMINOPHEN 500 MG PO TABS
ORAL_TABLET | ORAL | Status: AC
  Administered 2020-02-12: 1000 mg via ORAL
  Filled 2020-02-12: qty 2

## 2020-02-12 MED ORDER — MIDAZOLAM HCL 2 MG/2ML IJ SOLN
INTRAMUSCULAR | Status: AC
  Filled 2020-02-12: qty 2

## 2020-02-12 MED ORDER — FENTANYL CITRATE (PF) 250 MCG/5ML IJ SOLN
INTRAMUSCULAR | Status: DC | PRN
  Administered 2020-02-12: 250 ug via INTRAVENOUS
  Administered 2020-02-12 (×5): 50 ug via INTRAVENOUS

## 2020-02-12 MED ORDER — ASPIRIN EC 81 MG PO TBEC
81.0000 mg | DELAYED_RELEASE_TABLET | ORAL | Status: DC
  Administered 2020-02-13: 81 mg via ORAL
  Filled 2020-02-12: qty 1

## 2020-02-12 MED ORDER — ONDANSETRON HCL 4 MG/2ML IJ SOLN: 4.0000 mg | Freq: Four times a day (QID) | INTRAMUSCULAR | Status: DC | PRN

## 2020-02-12 MED ORDER — EPHEDRINE SULFATE-NACL 50-0.9 MG/10ML-% IV SOSY
PREFILLED_SYRINGE | INTRAVENOUS | Status: DC | PRN
  Administered 2020-02-12 (×2): 10 mg via INTRAVENOUS

## 2020-02-12 MED ORDER — LACTATED RINGERS IV SOLN: INTRAVENOUS | Status: DC

## 2020-02-12 MED ORDER — GABAPENTIN 300 MG PO CAPS
300.0000 mg | ORAL_CAPSULE | Freq: Three times a day (TID) | ORAL | Status: DC
  Administered 2020-02-12 – 2020-02-13 (×2): 300 mg via ORAL
  Filled 2020-02-12 (×2): qty 1

## 2020-02-12 MED ORDER — MEPERIDINE HCL 25 MG/ML IJ SOLN: 6.2500 mg | INTRAMUSCULAR | Status: DC | PRN

## 2020-02-12 MED ORDER — FLEET ENEMA 7-19 GM/118ML RE ENEM: 1.0000 | ENEMA | Freq: Once | RECTAL | Status: DC | PRN

## 2020-02-12 MED ORDER — ACETAMINOPHEN 500 MG PO TABS: 1000.0000 mg | ORAL_TABLET | Freq: Once | ORAL | Status: AC

## 2020-02-12 MED ORDER — NEBIVOLOL HCL 10 MG PO TABS
10.0000 mg | ORAL_TABLET | Freq: Every day | ORAL | Status: DC
  Administered 2020-02-13: 10 mg via ORAL
  Filled 2020-02-12 (×2): qty 1

## 2020-02-12 MED ORDER — OXYCODONE HCL 5 MG PO TABS
10.0000 mg | ORAL_TABLET | ORAL | Status: DC | PRN
  Administered 2020-02-12 – 2020-02-13 (×4): 10 mg via ORAL
  Filled 2020-02-12 (×4): qty 2

## 2020-02-12 MED ORDER — NEBIVOLOL HCL 10 MG PO TABS
10.0000 mg | ORAL_TABLET | Freq: Once | ORAL | Status: AC
  Administered 2020-02-12: 10 mg via ORAL
  Filled 2020-02-12: qty 1

## 2020-02-12 MED ORDER — INSULIN ASPART 100 UNIT/ML ~~LOC~~ SOLN
0.0000 [IU] | Freq: Three times a day (TID) | SUBCUTANEOUS | Status: DC
  Administered 2020-02-12: 4 [IU] via SUBCUTANEOUS

## 2020-02-12 MED ORDER — THROMBIN 20000 UNITS EX SOLR
CUTANEOUS | Status: DC | PRN
  Administered 2020-02-12: 20 mL

## 2020-02-12 MED ORDER — FENTANYL CITRATE (PF) 250 MCG/5ML IJ SOLN
INTRAMUSCULAR | Status: AC
  Filled 2020-02-12: qty 5

## 2020-02-12 MED ORDER — OXYCODONE HCL 5 MG/5ML PO SOLN: 5.0000 mg | Freq: Once | ORAL | Status: DC | PRN

## 2020-02-12 MED ORDER — ROCURONIUM BROMIDE 10 MG/ML (PF) SYRINGE
PREFILLED_SYRINGE | INTRAVENOUS | Status: AC
  Filled 2020-02-12: qty 10

## 2020-02-12 MED ORDER — ONDANSETRON HCL 4 MG/2ML IJ SOLN
INTRAMUSCULAR | Status: AC
  Filled 2020-02-12: qty 2

## 2020-02-12 MED ORDER — ONDANSETRON HCL 4 MG PO TABS: 4.0000 mg | ORAL_TABLET | Freq: Four times a day (QID) | ORAL | Status: DC | PRN

## 2020-02-12 MED ORDER — VANCOMYCIN HCL 1000 MG IV SOLR
INTRAVENOUS | Status: DC | PRN
  Administered 2020-02-12: 1000 mg

## 2020-02-12 MED ORDER — MIDAZOLAM HCL 2 MG/2ML IJ SOLN: 0.5000 mg | Freq: Once | INTRAMUSCULAR | Status: DC | PRN

## 2020-02-12 MED ORDER — FERROUS SULFATE 325 (65 FE) MG PO TABS
325.0000 mg | ORAL_TABLET | Freq: Two times a day (BID) | ORAL | Status: DC
  Administered 2020-02-12 – 2020-02-13 (×2): 325 mg via ORAL
  Filled 2020-02-12 (×2): qty 1

## 2020-02-12 MED ORDER — SUGAMMADEX SODIUM 200 MG/2ML IV SOLN
INTRAVENOUS | Status: DC | PRN
  Administered 2020-02-12: 300 mg via INTRAVENOUS

## 2020-02-12 MED ORDER — ACETAMINOPHEN 325 MG PO TABS
650.0000 mg | ORAL_TABLET | ORAL | Status: DC | PRN
  Administered 2020-02-13: 650 mg via ORAL
  Filled 2020-02-12: qty 2

## 2020-02-12 MED ORDER — 0.9 % SODIUM CHLORIDE (POUR BTL) OPTIME
TOPICAL | Status: DC | PRN
  Administered 2020-02-12: 1000 mL

## 2020-02-12 SURGICAL SUPPLY — 65 items
ADH SKN CLS APL DERMABOND .7 (GAUZE/BANDAGES/DRESSINGS) ×1
APL SKNCLS STERI-STRIP NONHPOA (GAUZE/BANDAGES/DRESSINGS) ×1
BAG DECANTER FOR FLEXI CONT (MISCELLANEOUS) ×3 IMPLANT
BASKET BONE COLLECTION (BASKET) ×2 IMPLANT
BENZOIN TINCTURE PRP APPL 2/3 (GAUZE/BANDAGES/DRESSINGS) ×3 IMPLANT
BLADE CLIPPER SURG (BLADE) IMPLANT
BUR CUTTER 7.0 ROUND (BURR) ×2 IMPLANT
BUR MATCHSTICK NEURO 3.0 LAGG (BURR) ×3 IMPLANT
CANISTER SUCT 3000ML PPV (MISCELLANEOUS) ×3 IMPLANT
CAP LCK SPNE (Orthopedic Implant) ×4 IMPLANT
CAP LOCK SPINE RADIUS (Orthopedic Implant) IMPLANT
CAP LOCKING (Orthopedic Implant) ×12 IMPLANT
CARTRIDGE OIL MAESTRO DRILL (MISCELLANEOUS) ×1 IMPLANT
CLOSURE WOUND 1/2 X4 (GAUZE/BANDAGES/DRESSINGS) ×1
CNTNR URN SCR LID CUP LEK RST (MISCELLANEOUS) ×1 IMPLANT
CONT SPEC 4OZ STRL OR WHT (MISCELLANEOUS) ×3
COVER BACK TABLE 60X90IN (DRAPES) ×3 IMPLANT
COVER WAND RF STERILE (DRAPES) ×1 IMPLANT
DECANTER SPIKE VIAL GLASS SM (MISCELLANEOUS) ×3 IMPLANT
DERMABOND ADVANCED (GAUZE/BANDAGES/DRESSINGS) ×2
DERMABOND ADVANCED .7 DNX12 (GAUZE/BANDAGES/DRESSINGS) ×1 IMPLANT
DEVICE INTERBODY ELEVATE 9X23 (Cage) ×4 IMPLANT
DIFFUSER DRILL AIR PNEUMATIC (MISCELLANEOUS) ×3 IMPLANT
DRAPE C-ARM 42X72 X-RAY (DRAPES) ×6 IMPLANT
DRAPE HALF SHEET 40X57 (DRAPES) IMPLANT
DRAPE LAPAROTOMY 100X72X124 (DRAPES) ×3 IMPLANT
DRAPE SURG 17X23 STRL (DRAPES) ×12 IMPLANT
DRSG OPSITE POSTOP 4X6 (GAUZE/BANDAGES/DRESSINGS) ×3 IMPLANT
DRSG OPSITE POSTOP 4X8 (GAUZE/BANDAGES/DRESSINGS) ×2 IMPLANT
DURAPREP 26ML APPLICATOR (WOUND CARE) ×3 IMPLANT
ELECT REM PT RETURN 9FT ADLT (ELECTROSURGICAL) ×3
ELECTRODE REM PT RTRN 9FT ADLT (ELECTROSURGICAL) ×1 IMPLANT
EVACUATOR 1/8 PVC DRAIN (DRAIN) IMPLANT
GAUZE 4X4 16PLY RFD (DISPOSABLE) IMPLANT
GAUZE SPONGE 4X4 12PLY STRL (GAUZE/BANDAGES/DRESSINGS) IMPLANT
GLOVE BIO SURGEON STRL SZ 6.5 (GLOVE) ×2 IMPLANT
GLOVE BIO SURGEONS STRL SZ 6.5 (GLOVE) ×1
GLOVE BIOGEL PI IND STRL 6.5 (GLOVE) ×1 IMPLANT
GLOVE BIOGEL PI INDICATOR 6.5 (GLOVE) ×2
GLOVE ECLIPSE 9.0 STRL (GLOVE) ×6 IMPLANT
GOWN STRL REUS W/ TWL LRG LVL3 (GOWN DISPOSABLE) IMPLANT
GOWN STRL REUS W/ TWL XL LVL3 (GOWN DISPOSABLE) ×2 IMPLANT
GOWN STRL REUS W/TWL 2XL LVL3 (GOWN DISPOSABLE) IMPLANT
GOWN STRL REUS W/TWL LRG LVL3 (GOWN DISPOSABLE)
GOWN STRL REUS W/TWL XL LVL3 (GOWN DISPOSABLE) ×6
KIT BASIN OR (CUSTOM PROCEDURE TRAY) ×3 IMPLANT
KIT TURNOVER KIT B (KITS) ×3 IMPLANT
MILL MEDIUM DISP (BLADE) ×3 IMPLANT
NEEDLE HYPO 22GX1.5 SAFETY (NEEDLE) ×3 IMPLANT
NS IRRIG 1000ML POUR BTL (IV SOLUTION) ×3 IMPLANT
OIL CARTRIDGE MAESTRO DRILL (MISCELLANEOUS) ×3
PACK LAMINECTOMY NEURO (CUSTOM PROCEDURE TRAY) ×3 IMPLANT
ROD RADIUS 45MM (Rod) ×6 IMPLANT
ROD SPNL 45X5.5XNS TI RDS (Rod) IMPLANT
SCREW 5.75X45MM (Screw) ×4 IMPLANT
SPONGE SURGIFOAM ABS GEL 100 (HEMOSTASIS) ×3 IMPLANT
STRIP CLOSURE SKIN 1/2X4 (GAUZE/BANDAGES/DRESSINGS) ×3 IMPLANT
SUT VIC AB 0 CT1 18XCR BRD8 (SUTURE) ×2 IMPLANT
SUT VIC AB 0 CT1 8-18 (SUTURE) ×6
SUT VIC AB 2-0 CT1 18 (SUTURE) ×3 IMPLANT
SUT VIC AB 3-0 SH 8-18 (SUTURE) ×6 IMPLANT
TOWEL GREEN STERILE (TOWEL DISPOSABLE) ×3 IMPLANT
TOWEL GREEN STERILE FF (TOWEL DISPOSABLE) ×3 IMPLANT
TRAY FOLEY MTR SLVR 16FR STAT (SET/KITS/TRAYS/PACK) ×3 IMPLANT
WATER STERILE IRR 1000ML POUR (IV SOLUTION) ×3 IMPLANT

## 2020-02-12 NOTE — Transfer of Care (Signed)
Immediate Anesthesia Transfer of Care Note  Patient: Autumn Carson  Procedure(s) Performed: Posterior Lumbar Interbody Fusion - Lumbar Two-Lumbar Three (N/A Back)  Patient Location: PACU  Anesthesia Type:General  Level of Consciousness: awake, alert  and oriented  Airway & Oxygen Therapy: Patient Spontanous Breathing and Patient connected to face mask oxygen  Post-op Assessment: Report given to RN and Post -op Vital signs reviewed and stable  Post vital signs: Reviewed and stable  Last Vitals:  Vitals Value Taken Time  BP 148/77 02/12/20 1456  Temp 36.6 C 02/12/20 1456  Pulse 73 02/12/20 1459  Resp 11 02/12/20 1459  SpO2 94 % 02/12/20 1459  Vitals shown include unvalidated device data.  Last Pain:  Vitals:   02/12/20 1456  TempSrc:   PainSc: (P) Asleep      Patients Stated Pain Goal: 2 (32/91/91 [id number])  Complications: No complications documented.

## 2020-02-12 NOTE — H&P (Signed)
Autumn Carson is an 64 y.o. female.   Chief Complaint: Back and left leg pain HPI: 64 year old female remotely status post L3-4 and L4-5 decompression and fusion with good results.  Patient with progressive back pain and left anterior thigh pain sensory loss and weakness over the past year.  She has failed conservative management including injection therapy.  Work-up demonstrates evidence of progressive adjacent level degeneration with marked facet arthropathy and severe lateral recess stenosis and moderately severe foraminal stenosis at the L2-3 level.  Patient presents now for L2-3 decompression and fusion in hopes of improving her symptoms. Past Medical History:  Diagnosis Date  . Anxiety   . Arthritis of both knees   . Back pain   . Cancer ([place])    Thyroid  . Chronic anemia   . Depression 1998  . Essential hypertension   . GERD (gastroesophageal reflux disease)   . History of thyroid cancer 2011   Papillary thyroid cancer s/p radiation  . Hypothyroidism   . Lumbar spinal stenosis   . Obesity   . PONV (postoperative nausea and vomiting)   . Seasonal allergies   . Type 2 diabetes mellitus with diabetic neuropathy [facility])     Past Surgical History:  Procedure Laterality Date  . BACK SURGERY    . BIOPSY N/A 01/16/2013   Procedure: BIOPSY;  Surgeon: Danie Binder, MD;  [facility];  Service: Endoscopy;  Laterality: N/A;  SMALL BOWEL ULCERS  . BRAVO [place] STUDY N/A 11/22/2015   Procedure: BRAVO [place];  Surgeon: Danie Binder, MD;  [facility];  Service: Endoscopy;  Laterality: N/A;  . CARPAL TUNNEL RELEASE     4 total, bilaterally  . CESAREAN SECTION    . CHOLECYSTECTOMY  1990's  . COLONOSCOPY WITH ESOPHAGOGASTRODUODENOSCOPY (EGD) N/A 12/08/2012   SLF: 1. Normal mucosa in the terminal ileum 2. mild diverticulosis throughout the entire examined colon. 3. rectal bleeding due to moderate sized internal hemorrhoids.   . ELBOW SURGERY Bilateral   .  ENTEROSCOPY N/A 01/16/2013   Procedure: ENTEROSCOPY;  Surgeon: Danie Binder, MD;  [facility];  Service: Endoscopy;  Laterality: N/A;  2:00  . ESOPHAGOGASTRODUODENOSCOPY N/A 11/22/2015   Procedure: ESOPHAGOGASTRODUODENOSCOPY (EGD);  Surgeon: Danie Binder, MD;  [facility];  Service: Endoscopy;  Laterality: N/A;  130 - moved to 2:00 - office to notify pt  . GIVENS CAPSULE STUDY N/A 12/31/2012   Procedure: GIVENS CAPSULE STUDY;  Surgeon: Danie Binder, MD;  [facility];  Service: Endoscopy;  Laterality: N/A;  7:30  . ROTATOR CUFF REPAIR Bilateral    bilaterally  . SAVORY DILATION N/A 11/22/2015   Procedure: SAVORY DILATION;  Surgeon: Danie Binder, MD;  [facility];  Service: Endoscopy;  Laterality: N/A;  . SPINE SURGERY     Lumbar Fusion- Dr. Trenton Gammon  . TONSILECTOMY, ADENOIDECTOMY, BILATERAL MYRINGOTOMY AND TUBES  1987  . TONSILLECTOMY  1987  . TOTAL THYROIDECTOMY  2011  . TUBAL LIGATION      Family History  Problem Relation Age of Onset  . Heart disease Mother   . Lung cancer Father        Age 53  . Colon cancer Brother        Age 3  . Heart attack Daughter 58  . Hypertension Daughter   . Other Brother        Agent orange, age 75, cancer  . Breast cancer Cousin  Social History:  reports that she has never smoked. She has never used smokeless tobacco. She reports current alcohol use. She reports that she does not use drugs.  Allergies:  Allergies  Allergen Reactions  . Lisinopril Anaphylaxis    angioedema    Medications Prior to Admission  Medication Sig Dispense Refill  . amLODipine (NORVASC) 10 MG tablet TAKE 1 TABLET EVERY DAY (Patient taking differently: Take 10 mg by mouth daily. ) 90 tablet 3  . aspirin 81 MG tablet Take 81 mg by mouth every other day.    . celecoxib (CELEBREX) 100 MG capsule Take 1 capsule (100 mg total) by mouth 2 (two) times daily. 60 capsule 1  . diclofenac sodium (VOLTAREN) 1 % GEL Apply dime  size to knees three times a day as needed (Patient taking differently: Apply 1 application topically 3 (three) times daily as needed (pain). Apply dime size to knees three times a day as needed) 100 g 3  . ferrous sulfate (FE TABS) 325 (65 FE) MG EC tablet Take 1 tablet (325 mg total) by mouth 2 (two) times daily with a meal. 60 tablet 3  . FLUoxetine (PROZAC) 40 MG capsule TAKE 1 CAPSULE EVERY DAY BEFORE BREAKFAST (Patient taking differently: Take 40 mg by mouth daily. TAKE 1 CAPSULE EVERY DAY BEFORE BREAKFAST) 90 capsule 1  . hydrALAZINE (APRESOLINE) 10 MG tablet Take 1 tablet (10 mg total) by mouth 3 (three) times daily. 270 tablet 3  . HYDROcodone-acetaminophen (NORCO) 5-325 MG tablet Take 1 tablet by mouth every 6 (six) hours as needed for moderate pain. Chronic Pain. Dx: G89.4 60 tablet 0  . levothyroxine (SYNTHROID) 175 MCG tablet Take 175 mcg by mouth daily before breakfast.    . metFORMIN (GLUCOPHAGE) 1000 MG tablet TAKE 1 TABLET TWICE DAILY IN THE MORNING  AND IN THE EVENING (Patient taking differently: Take 1,000 mg by mouth 2 (two) times daily with a meal. TAKE 1 TABLET TWICE DAILY IN THE MORNING  AND IN THE EVENING) 180 tablet 1  . nebivolol (BYSTOLIC) 10 MG tablet TAKE 1 TABLET EVERY DAY (Patient taking differently: Take 10 mg by mouth daily. TAKE 1 TABLET EVERY DAY) 90 tablet 2  . omeprazole (PRILOSEC) 40 MG capsule TAKE 1 CAPSULE TWICE DAILY  30  MINUTES  PRIOR  TO  MEALS (Patient taking differently: Take 40 mg by mouth daily. ) 180 capsule 3  . simvastatin (ZOCOR) 40 MG tablet Take 1 tablet (40 mg total) by mouth daily. 90 tablet 3  . zolpidem (AMBIEN) 5 MG tablet TAKE 1 TABLET EVERY NIGHT AT BEDTIME AS NEEDED FOR SLEEP (Patient taking differently: Take 5 mg by mouth at bedtime as needed for sleep. TAKE 1 TABLET EVERY NIGHT AT BEDTIME AS NEEDED FOR SLEEP) 30 tablet 3  . Blood Glucose Monitoring Suppl (TRUE METRIX AIR GLUCOSE METER) w/Device KIT USE AS DIRECTED 1 kit 0  . gabapentin  (NEURONTIN) 300 MG capsule Take 1 capsule (300 mg total) by mouth 3 (three) times daily. 270 capsule 1  . glucose blood test strip CHECK FINGER STICK BLOOD SUGAR TWICE DAILY AS DIRECTED 200 each 1  . naproxen (NAPROSYN) 500 MG tablet Take 1 tablet (500 mg total) by mouth 2 (two) times daily with a meal. 180 tablet 2    Results for orders placed or performed during the hospital encounter of 02/10/20 (from the past 48 hour(s))  Surgical pcr screen     Status: Abnormal   Collection Time: 02/10/20 10:49 AM  Specimen: Nasal Mucosa; Nasal Swab  Result Value Ref Range   MRSA, PCR NEGATIVE NEGATIVE   Staphylococcus aureus POSITIVE (A) NEGATIVE    Comment: (NOTE) The Xpert SA Assay (FDA approved for NASAL specimens in patients 80 years of age and older), is one component of a comprehensive surveillance program. It is not intended to diagnose infection nor to guide or monitor treatment. Performed at [facility], [place] [address] [place], [place] [address]   Type and screen     Status: None   Collection Time: 02/10/20 10:54 AM  Result Value Ref Range   ABO/RH(D) AB NEG    Antibody Screen NEG    Sample Expiration 02/24/2020,2359    Extend sample reason      NO TRANSFUSIONS OR PREGNANCY IN THE PAST 3 MONTHS Performed at [facility], [place] [address] [place], [place] [address]   CBC WITH DIFFERENTIAL     Status: Abnormal   Collection Time: 02/10/20 10:55 AM  Result Value Ref Range   WBC 5.4 4.0 - 10.5 K/uL   RBC 4.36 3.87 - 5.11 MIL/uL   Hemoglobin 11.0 (L) 12.0 - 15.0 g/dL   HCT 36.2 36 - 46 %   MCV 83.0 80.0 - 100.0 fL   MCH 25.2 (L) 26.0 - 34.0 pg   MCHC 30.4 30.0 - 36.0 g/dL   RDW 16.3 (H) 11.5 - 15.5 %   Platelets 267 150 - 400 K/uL   nRBC 0.0 0.0 - 0.2 %   Neutrophils Relative % 63 %   Neutro Abs 3.4 1.7 - 7.7 K/uL   Lymphocytes Relative 25 %   Lymphs Abs 1.4 0.7 - 4.0 K/uL   Monocytes Relative 8 %   Monocytes Absolute 0.4 0 - 1 K/uL   Eosinophils Relative 3 %    Eosinophils Absolute 0.1 0 - 0 K/uL   Basophils Relative 1 %   Basophils Absolute 0.0 0 - 0 K/uL   Immature Granulocytes 0 %   Abs Immature Granulocytes 0.01 0.00 - 0.07 K/uL    Comment: Performed at [facility], [address] [place], [place] [address]   No results found.  Pertinent items noted in HPI and remainder of comprehensive ROS otherwise negative.  Last menstrual period 09/07/2011.  Patient is awake and alert.  She is oriented and appropriate.  Speech is fluent.  Judgment insight are intact.  Cranial nerve function normal bilateral.  Motor examination of the extremities reveals weakness of her left-sided quadriceps muscle group otherwise motor strength intact.  Sensory examination with some decrease sensation pinprick and light touch in her left L3 dermatome.  Deep tendon axes are hypoactive but symmetric.  Achilles reflexes are absent.  No evidence of long track signs.  Gait is antalgic.  Posture is flexed.  Examination head ears eyes nose and throat is unremarked.  Chest and abdomen are grossly obese but otherwise unremarkable.  Extremities are free from injury or deformity. Assessment/Plan L2-3 lateral recess and foraminal stenosis with back pain and radiculopathy status post prior L3-L5 fusion.  Plan bilateral L2-3 decompressive laminotomies and foraminotomies followed by posterior lumbar interbody fusion utilizing interbody cages, locally harvested autograft, and augmented with posterior letter arthrodesis utilizing pedicle screw fixation and local autografting.  Risks and benefits been explained.  Patient wishes to proceed.  Mallie Mussel A Collan Schoenfeld 02/12/2020, 9:54 AM

## 2020-02-12 NOTE — Anesthesia Procedure Notes (Signed)
Procedure Name: Intubation Date/Time: 02/12/2020 11:09 AM Performed by: Griffin Dakin, CRNA Pre-anesthesia Checklist: Patient identified, Emergency Drugs available, Suction available, Patient being monitored and Timeout performed Patient Re-evaluated:Patient Re-evaluated prior to induction Oxygen Delivery Method: Circle system utilized Preoxygenation: Pre-oxygenation with 100% oxygen Induction Type: IV induction Ventilation: Mask ventilation without difficulty Laryngoscope Size: Mac and 3 Grade View: Grade I Tube type: Oral Tube size: 7.0 mm Number of attempts: 1 Airway Equipment and Method: Stylet Placement Confirmation: ETT inserted through vocal cords under direct vision,  positive ETCO2 and breath sounds checked- equal and bilateral Secured at: 21 cm Tube secured with: Tape Dental Injury: Teeth and Oropharynx as per pre-operative assessment

## 2020-02-12 NOTE — Anesthesia Postprocedure Evaluation (Signed)
Anesthesia Post Note  Patient: MELICIA ESQUEDA  Procedure(s) Performed: Posterior Lumbar Interbody Fusion - Lumbar Two-Lumbar Three (N/A Back)     Patient location during evaluation: PACU Anesthesia Type: General Level of consciousness: sedated, oriented and patient cooperative Pain management: pain level controlled Vital Signs Assessment: post-procedure vital signs reviewed and stable Respiratory status: spontaneous breathing, nonlabored ventilation, respiratory function stable and patient connected to nasal cannula oxygen Cardiovascular status: blood pressure returned to baseline and stable Postop Assessment: no apparent nausea or vomiting Anesthetic complications: no   No complications documented.  Last Vitals:  Vitals:   02/12/20 1540 02/12/20 1555  BP: (!) 161/77 (!) 165/77  Pulse: 68 66  Resp: 10 17  Temp: 36.7 C   SpO2: 98% 95%    Last Pain:  Vitals:   02/12/20 1525  TempSrc:   PainSc: Asleep                 Jaiden Wahab,E. Daymen Hassebrock

## 2020-02-12 NOTE — Anesthesia Preprocedure Evaluation (Addendum)
Anesthesia Evaluation  Patient identified by MRN, date of birth, ID band Patient awake    Reviewed: Allergy & Precautions, NPO status , Patient's Chart, lab work & pertinent test results  History of Anesthesia Complications (+) PONV  Airway Mallampati: I  TM Distance: >3 FB Neck ROM: Full    Dental  (+) Poor Dentition, Missing, Dental Advisory Given   Pulmonary  02/10/2020 SARS coronavirus NEG   breath sounds clear to auscultation       Cardiovascular hypertension, Pt. on medications and Pt. on home beta blockers (-) angina Rhythm:Regular Rate:Normal  '17 ECHO: EF 60-65%, valves OK   Neuro/Psych Anxiety Depression Chronic back pain    GI/Hepatic Neg liver ROS, GERD  Medicated and Controlled,  Endo/Other  diabetes, Oral Hypoglycemic AgentsHypothyroidism Morbid obesity  Renal/GU negative Renal ROS     Musculoskeletal   Abdominal (+) + obese,   Peds  Hematology negative hematology ROS (+)   Anesthesia Other Findings   Reproductive/Obstetrics                            Anesthesia Physical Anesthesia Plan  ASA: III  Anesthesia Plan: General   Post-op Pain Management:    Induction: Intravenous  PONV Risk Score and Plan: 4 or greater and Scopolamine patch - Pre-op, Dexamethasone and Ondansetron  Airway Management Planned: Oral ETT  Additional Equipment: None  Intra-op Plan:   Post-operative Plan: Extubation in OR  Informed Consent: I have reviewed the patients History and Physical, chart, labs and discussed the procedure including the risks, benefits and alternatives for the proposed anesthesia with the patient or authorized representative who has indicated his/her understanding and acceptance.     Dental advisory given  Plan Discussed with: CRNA and Surgeon  Anesthesia Plan Comments:        Anesthesia Quick Evaluation

## 2020-02-12 NOTE — Brief Op Note (Signed)
02/12/2020  2:43 PM  PATIENT:  Epimenio Foot Farrar-Summers  64 y.o. female  PRE-OPERATIVE DIAGNOSIS:  Stenosis  POST-OPERATIVE DIAGNOSIS:  Stenosis  PROCEDURE:  Procedure(s) with comments: Posterior Lumbar Interbody Fusion - Lumbar Two-Lumbar Three (N/A) - Posterior Lumbar Interbody Fusion - Lumbar Two-Lumbar Three  SURGEON:  Surgeon(s) and Role:    * Earnie Larsson, MD - Primary    * Eustace Moore, MD - Assisting  PHYSICIAN ASSISTANT:   ASSISTANTS:    ANESTHESIA:   general  EBL:  200 mL   BLOOD ADMINISTERED:none  DRAINS: none   LOCAL MEDICATIONS USED:  MARCAINE     SPECIMEN:  No Specimen  DISPOSITION OF SPECIMEN:  N/A  COUNTS:  YES  TOURNIQUET:  * No tourniquets in log *  DICTATION: .Dragon Dictation  PLAN OF CARE: Admit for overnight observation  PATIENT DISPOSITION:  PACU - hemodynamically stable.   Delay start of Pharmacological VTE agent (>24hrs) due to surgical blood loss or risk of bleeding: yes

## 2020-02-12 NOTE — Op Note (Signed)
Date of procedure: 02/12/2020  Date of dictation: Same  Service: Neurosurgery  Preoperative diagnosis: L2-3 foraminal stenosis, lateral recess stenosis with radiculopathy, status post prior L3-4 and L4-5 posterior lumbar fusion  Postoperative diagnosis: Same  Procedure Name: L2-3 redo decompressive laminotomies with foraminotomies, more than would be required for simple interbody fusion alone.  L2-3 posterior lumbar interbody fusion utilizing interbody cages and locally harvested autograft  L2-3 posterior lateral arthrodesis utilizing nonsegmental pedicle screw fixation and local autograft.  Reexploration of L3-L5 posterior lateral fusion with removal of hardware  Surgeon:Zaul Hubers A.Rayn Shorb, M.D.  Asst. Surgeon: Ronnald Ramp  Anesthesia: General  Indication: 64 year old female status post remote L3-L5 decompression and fusion with good results presents with worsening back and left lower extremity radicular pain numbness and weakness.  Work-up demonstrates evidence of severe adjacent level degeneration with severe facet arthropathy and marked lateral recess and foraminal stenosis.  Patient is failed conservative management and presents now for lumbar decompression and fusion in hopes of improving her symptoms.  Operative note: After induction of anesthesia, patient position prone on the Wilson frame and properly padded.  Lumbar region prepped and draped sterilely.  Incision made overlying L2-L5.  Dissection performed bilaterally.  Previously placed pedicle screw instrumentation from L3-L5 was dissected free, disassembled and then removed.  Fusion at L3-4 and 5 was inspected and found to be's solid.  Attention then placed L2-3.  Retractor placed.  Previous laminectomy of L3 was dissected free.  Hypertrophic lamina and inferior facets of L2 were dissected free.  Decompressive laminotomies were then performed bilaterally using [facility] and high-speed drill to remove the inferior two thirds  of the lamina of L to the entire inferior facet and pars interarticularis of L2 bilaterally the majority of the superior facet of L3 bilaterally.  Ligament flavum and epidural scar were elevated and resected.  Foraminotomies completed on the course exiting L2 and L3 nerve roots bilaterally.  The spaces dissected free.  Discectomies performed bilaterally.  The spaces then prepared for interbody fusion.  With a distractor placed patient's right side to space was cleaned of soft tissue on the left side.  A 9 mm [employer]onic extra lordotic expandable cage packed with locally harvested autograft was then impacted into place and expanded to its full extent.  Distractor removed patient's right side.  The space prepared on the right side.  Morselized autograft packed in the interspace.  Second cage packed with autograft was then impacted in place and expanded to its full extent.  Pedicles at L2 were identified using surface landmarks and intraoperative fluoroscopy.  Superficial bone around the pedicle was then removed using high-speed drill.  Each pedicle was then probed using pedicle all each pedicle tract was then probed and found to be solidly within the bones.  Each pedicle tract was then tapped with a screw tap and once again this was probed to confirm good placement.  5.75 mm radius brand screws from [employer] were placed bilaterally at L2.  Final images reveal good position of cages and the hardware at the proper upper level with normal alignment of spine.  Transverse processes and residual facets were decorticated.  Morselized autograft was packed posterior laterally.  Short segment titanium rod placed over the screw heads at L2 and L3.  Locking caps placed over the screws and locking caps then engaged with construct under compression.  Gelfoam was placed over the laminotomy sites.  Vancomycin powder placed in the deep wound space.  Wound was then closed in layers with  Vicryl sutures.  Steri-Strips and sterile  dressing were applied.  No apparent complications.  Patient tolerated the procedure well and she returns to the recovery room postop.

## 2020-02-13 DIAGNOSIS — E1140 Type 2 diabetes mellitus with diabetic neuropathy, unspecified: Secondary | ICD-10-CM | POA: Diagnosis not present

## 2020-02-13 DIAGNOSIS — Z7982 Long term (current) use of aspirin: Secondary | ICD-10-CM | POA: Diagnosis not present

## 2020-02-13 DIAGNOSIS — M48061 Spinal stenosis, lumbar region without neurogenic claudication: Secondary | ICD-10-CM | POA: Diagnosis not present

## 2020-02-13 DIAGNOSIS — E669 Obesity, unspecified: Secondary | ICD-10-CM | POA: Diagnosis not present

## 2020-02-13 DIAGNOSIS — F419 Anxiety disorder, unspecified: Secondary | ICD-10-CM | POA: Diagnosis not present

## 2020-02-13 DIAGNOSIS — Z7984 Long term (current) use of oral hypoglycemic drugs: Secondary | ICD-10-CM | POA: Diagnosis not present

## 2020-02-13 DIAGNOSIS — M9963 Osseous and subluxation stenosis of intervertebral foramina of lumbar region: Secondary | ICD-10-CM | POA: Diagnosis not present

## 2020-02-13 DIAGNOSIS — K219 Gastro-esophageal reflux disease without esophagitis: Secondary | ICD-10-CM | POA: Diagnosis not present

## 2020-02-13 DIAGNOSIS — Z791 Long term (current) use of non-steroidal anti-inflammatories (NSAID): Secondary | ICD-10-CM | POA: Diagnosis not present

## 2020-02-13 LAB — GLUCOSE, CAPILLARY: Glucose-Capillary: 119 mg/dL — ABNORMAL HIGH (ref 70–99)

## 2020-02-13 NOTE — Discharge Summary (Signed)
Physician Discharge Summary  Patient ID: Autumn Carson MRN: [id number] DOB/AGE: [id number] 62 y.o. Estimated body mass index is 50.68 kg/m as calculated from the following:   Height as of this encounter: 5' 6" (1.676 m).   Weight as of this encounter: 142.4 kg.   Admit date: 02/12/2020 Discharge date: 02/13/2020  Admission Diagnoses: Lumbar spondylosis with stenosis L2-3  Discharge Diagnoses: Same Active Problems:   Lumbar foraminal stenosis   Discharged Condition: good  Hospital Course: Patient is admitted to hospital underwent decompression stabilization procedure at L2-3.  Postoperatively patient did very well covering the floor on the floor was ambulating and voiding spontaneously tolerating regular diet and stable for discharge home.  Consults: Significant Diagnostic Studies: Treatments: L2-3 decompression fusion Discharge Exam: Blood pressure (!) 125/50, pulse 63, temperature 99.3 F (37.4 C), temperature source Oral, resp. rate 17, height 5' 6" (1.676 m), weight (!) 142.4 kg, last menstrual period 09/07/2011, SpO2 97 %. Strength 5/5 wound clean dry and intact  Disposition: Home   Allergies as of 02/13/2020      Reactions   Lisinopril Anaphylaxis   angioedema      Medication List    TAKE these medications   amLODipine 10 MG tablet Commonly known as: NORVASC TAKE 1 TABLET EVERY DAY   aspirin 81 MG tablet Take 81 mg by mouth every other day.   celecoxib 100 MG capsule Commonly known as: CeleBREX Take 1 capsule (100 mg total) by mouth 2 (two) times daily.   diclofenac sodium 1 % Gel Commonly known as: Voltaren Apply dime size to knees three times a day as needed What changed:   how much to take  how to take this  when to take this  reasons to take this   ferrous sulfate 325 (65 FE) MG EC tablet Commonly known as: Fe Tabs Take 1 tablet (325 mg total) by mouth 2 (two) times daily with a meal.   FLUoxetine 40 MG capsule Commonly known  as: PROZAC TAKE 1 CAPSULE EVERY DAY BEFORE BREAKFAST What changed: See the new instructions.   gabapentin 300 MG capsule Commonly known as: NEURONTIN Take 1 capsule (300 mg total) by mouth 3 (three) times daily.   glucose blood test strip CHECK FINGER STICK BLOOD SUGAR TWICE DAILY AS DIRECTED   hydrALAZINE 10 MG tablet Commonly known as: APRESOLINE Take 1 tablet (10 mg total) by mouth 3 (three) times daily.   HYDROcodone-acetaminophen 5-325 MG tablet Commonly known as: Norco Take 1 tablet by mouth every 6 (six) hours as needed for moderate pain. Chronic Pain. Dx: G89.4   levothyroxine 175 MCG tablet Commonly known as: SYNTHROID Take 175 mcg by mouth daily before breakfast.   metFORMIN 1000 MG tablet Commonly known as: GLUCOPHAGE TAKE 1 TABLET TWICE DAILY IN THE MORNING  AND IN THE EVENING What changed: See the new instructions.   naproxen 500 MG tablet Commonly known as: NAPROSYN Take 1 tablet (500 mg total) by mouth 2 (two) times daily with a meal.   nebivolol 10 MG tablet Commonly known as: Bystolic TAKE 1 TABLET EVERY DAY What changed:   how much to take  how to take this  when to take this   omeprazole 40 MG capsule Commonly known as: PRILOSEC TAKE 1 CAPSULE TWICE DAILY  30  MINUTES  PRIOR  TO  MEALS What changed:   how much to take  how to take this  when to take this  additional instructions   simvastatin 40 MG tablet Commonly known  as: ZOCOR Take 1 tablet (40 mg total) by mouth daily.   True Metrix Air Glucose Meter w/Device Kit USE AS DIRECTED   zolpidem 5 MG tablet Commonly known as: AMBIEN TAKE 1 TABLET EVERY NIGHT AT BEDTIME AS NEEDED FOR SLEEP What changed:   how much to take  how to take this  when to take this  reasons to take this            Durable Medical Equipment  (From admission, onward)         Start     Ordered   02/12/20 1611  DME Walker rolling  Once       Question:  Patient needs a walker to treat with  the following condition  Answer:  Lumbar foraminal stenosis   02/12/20 1610   02/12/20 1611  DME 3 n 1  Once        02/12/20 1610           Signed: Elaina Hoops 02/13/2020, 7:40 AM

## 2020-02-13 NOTE — Progress Notes (Signed)
Discharged instructions/education/AVS/Rx given to patient with daughter at bedside and they both verbalized understanding. PAin is mild to moderate per patient. Ambulating well. Minimal serousanguenous drainage noted on incision, dressing changed. Voiding and emptying bladder well. Awaiting for transport.

## 2020-02-13 NOTE — Evaluation (Signed)
Occupational Therapy Evaluation Patient Details Name: Autumn Carson MRN: [id number] DOB: [birth year] Today's Date: 02/13/2020    History of Present Illness Pt is a 64 y/o female s/p PLIF L2-L3. PMH including but not limited to HTN, DM and hypothyroidism.   Clinical Impression   Pt PTA: Pt living with family and reports independence with ADL and mobility with SPC or RW at baseline. Pt currently pt  still able to perform LB dressing with one foot on bed sitting at EOB; pt simulating walk in shower transfer with supervisionA. Pt has family to assists as needed for iADLs. Pt reports using RW or SPC for mobility; pt using RW in room safely. Pt able to state and maintain back precautions. Pt stating precautions with no cues.  Back handout provided and reviewed adls in detail. Pt educated on: clothing between brace, never sleep in brace, set an alarm at night for medication, avoid sitting for long periods of time, correct bed positioning for sleeping, correct sequence for bed mobility, avoiding lifting more than 5 pounds and never wash directly over incision. All education is complete and patient indicates understanding. Pt does not require continued OT skilled services. OT signing off.     Follow Up Recommendations  No OT follow up    Equipment Recommendations  None recommended by OT    Recommendations for Other Services       Precautions / Restrictions Precautions Precautions: Back Precaution Comments: reviewed with pt throughout Required Braces or Orthoses: Spinal Brace Spinal Brace: Lumbar corset;Applied in sitting position Restrictions Weight Bearing Restrictions: No      Mobility Bed Mobility Overal bed mobility: Needs Assistance Bed Mobility: Supine to Sit;Sit to Supine     Supine to sit: Supervision Sit to supine: Supervision   General bed mobility comments: supervisionA for log roll   Transfers Overall transfer level: Needs assistance Equipment used: Rolling  walker (2 wheeled) Transfers: Sit to/from Stand Sit to Stand: Supervision         General transfer comment: using RW for mobility; but able to stand and maneuver in room without it    Balance Overall balance assessment: Needs assistance Sitting-balance support: No upper extremity supported;Feet supported Sitting balance-Leahy Scale: Good     Standing balance support: During functional activity;No upper extremity supported Standing balance-Leahy Scale: Fair                             ADL either performed or assessed with clinical judgement   ADL Overall ADL's : At baseline                                       General ADL Comments: Pt still able to perform LB dressing with one foot on bed sitting at EOB; pt simulating walk in shower transfer with supervisionA. Pt has family to assists as needed for iADLs. Pt reports using RW or SPC for mobility; pt using RW in room safely. Pt able to state and maintain back precautions. pt with no difficulty with no back precautions.     Vision Baseline Vision/History: No visual deficits Patient Visual Report: No change from baseline Vision Assessment?: No apparent visual deficits     Perception     Praxis      Pertinent Vitals/Pain Pain Assessment: Faces Faces Pain Scale: Hurts little more Pain Location: back Pain Descriptors / Indicators: Sore  Pain Intervention(s): Monitored during session;Repositioned     Hand Dominance Right   Extremity/Trunk Assessment Upper Extremity Assessment Upper Extremity Assessment: Overall WFL for tasks assessed   Lower Extremity Assessment Lower Extremity Assessment: Overall WFL for tasks assessed   Cervical / Trunk Assessment Cervical / Trunk Assessment: Other exceptions Cervical / Trunk Exceptions: s/p lumbar sx   Communication Communication Communication: No difficulties   Cognition Arousal/Alertness: Awake/alert Behavior During Therapy: WFL for tasks  assessed/performed Overall Cognitive Status: Within Functional Limits for tasks assessed                                     General Comments  Pt's daughter in room    Exercises     Shoulder Instructions      Home Living Family/patient expects to be discharged to:: Private residence Living Arrangements: Alone Available Help at Discharge: Family Type of Home: Mobile home Home Access: Stairs to enter Entrance Stairs-Number of Steps: 5 Entrance Stairs-Rails: Right Home Layout: One level               Home Equipment: Walker - 2 wheels;Cane - single point          Prior Functioning/Environment Level of Independence: Independent with assistive device(s)        Comments: ambulates with use of a cane        OT Problem List: Decreased activity tolerance;Pain      OT Treatment/Interventions:      OT Goals(Current goals can be found in the care plan section) Acute Rehab OT Goals Patient Stated Goal: "home today"  OT Frequency:     Barriers to D/C:            Co-evaluation              AM-PAC OT "6 Clicks" Daily Activity     Outcome Measure Help from another person eating meals?: None Help from another person taking care of personal grooming?: None Help from another person toileting, which includes using toliet, bedpan, or urinal?: None Help from another person bathing (including washing, rinsing, drying)?: A Little Help from another person to put on and taking off regular upper body clothing?: None Help from another person to put on and taking off regular lower body clothing?: A Little 6 Click Score: 22   End of Session Equipment Utilized During Treatment: Back brace;Rolling walker Nurse Communication: Mobility status  Activity Tolerance: Patient tolerated treatment well Patient left: in chair;with call bell/phone within reach;with family/visitor present  OT Visit Diagnosis: Unsteadiness on feet (R26.81);Pain Pain - part of body:   (low back)                Time: 4098-1191 OT Time Calculation (min): 11 min Charges:  OT General Charges $OT Visit: 1 Visit OT Evaluation $OT Eval Low Complexity: 1 Low  Jefferey Pica, OTR/L Acute Rehabilitation Services Pager: [phone] Office: [phone]   Bernardette Waldron  C 02/13/2020, 11:55 AM

## 2020-02-13 NOTE — Evaluation (Signed)
Physical Therapy Evaluation Patient Details Name: Autumn Carson MRN: [id number] DOB: [birth year] Today's Date: 02/13/2020   History of Present Illness  Pt is a 64 y/o female s/p PLIF L2-L3. PMH including but not limited to HTN, DM and hypothyroidism.  Clinical Impression  Pt presented standing in room with daughter present and willing to participate in therapy session. Prior to admission, pt reported that she ambulated with use of a cane and was independent with ADLs. Pt lives alone in a mobile home with five steps to enter. She will have assistance from her daughter and granddaughter upon d/c. At the time of evaluation, pt overall at a supervision to min guard level for functional mobility including hallway ambulation. Pt participated in stair training without difficulties as well. PT provided pt education re: back precautions, car transfers and a generalized walking program for pt to initiate upon d/c home. All questions were answered at the end of the session. No further acute PT needs identified at this time. PT signing off.     Follow Up Recommendations No PT follow up    Equipment Recommendations  None recommended by PT    Recommendations for Other Services       Precautions / Restrictions Precautions Precautions: Back Precaution Comments: reviewed with pt throughout Required Braces or Orthoses: Spinal Brace Spinal Brace: Lumbar corset;Applied in sitting position Restrictions Weight Bearing Restrictions: No      Mobility  Bed Mobility               General bed mobility comments: pt standing in room upon arrival  Transfers                 General transfer comment: pt standing in room upon arrival and remained standing at end of session in preparation for d/c home  Ambulation/Gait Ambulation/Gait assistance: Supervision Gait Distance (Feet): 500 Feet Assistive device: Rolling walker (2 wheeled);1 person hand held assist Gait Pattern/deviations:  Step-through pattern;Decreased stride length Gait velocity: reduced   General Gait Details: pt initially ambulating with use of RW for ~50' then switching to [facility] on R to simulate use of a cane. Pt steady overall with hallway ambulation, no LOB or need for physical assistance  Stairs Stairs: Yes Stairs assistance: Min guard Stair Management: One rail Right;Step to pattern;Forwards Number of Stairs: 4 General stair comments: pt steady without LOB or need for physical assistance, min guard for safety with use of unilateral rail to simulate home set-up  Wheelchair Mobility    Modified Rankin (Stroke Patients Only)       Balance Overall balance assessment: Needs assistance         Standing balance support: During functional activity;No upper extremity supported Standing balance-Leahy Scale: Fair                               Pertinent Vitals/Pain Pain Assessment: Faces Faces Pain Scale: Hurts little more Pain Location: back Pain Descriptors / Indicators: Sore Pain Intervention(s): Monitored during session    Home Living Family/patient expects to be discharged to:: Private residence Living Arrangements: Alone Available Help at Discharge: Family Type of Home: Mobile home Home Access: Stairs to enter Entrance Stairs-Rails: Right Entrance Stairs-Number of Steps: 5 Home Layout: One level Home Equipment: Walker - 2 wheels;Cane - single point      Prior Function Level of Independence: Independent with assistive device(s)         Comments: ambulates with use of  a cane     Hand Dominance        Extremity/Trunk Assessment   Upper Extremity Assessment Upper Extremity Assessment: Defer to OT evaluation;Overall WFL for tasks assessed    Lower Extremity Assessment Lower Extremity Assessment: Overall WFL for tasks assessed    Cervical / Trunk Assessment Cervical / Trunk Assessment: Other exceptions Cervical / Trunk Exceptions: s/p lumbar sx   Communication   Communication: No difficulties  Cognition Arousal/Alertness: Awake/alert Behavior During Therapy: WFL for tasks assessed/performed Overall Cognitive Status: Within Functional Limits for tasks assessed                                        General Comments      Exercises     Assessment/Plan    PT Assessment Patent does not need any further PT services  PT Problem List         PT Treatment Interventions      PT Goals (Current goals can be found in the Care Plan section)  Acute Rehab PT Goals Patient Stated Goal: "home today" PT Goal Formulation: All assessment and education complete, DC therapy    Frequency     Barriers to discharge        Co-evaluation               AM-PAC PT "6 Clicks" Mobility  Outcome Measure Help needed turning from your back to your side while in a flat bed without using bedrails?: None Help needed moving from lying on your back to sitting on the side of a flat bed without using bedrails?: None Help needed moving to and from a bed to a chair (including a wheelchair)?: None Help needed standing up from a chair using your arms (e.g., wheelchair or bedside chair)?: None Help needed to walk in hospital room?: None Help needed climbing 3-5 steps with a railing? : A Little 6 Click Score: 23    End of Session Equipment Utilized During Treatment: Back brace Activity Tolerance: Patient tolerated treatment well Patient left: with call bell/phone within reach;with family/visitor present Nurse Communication: Mobility status PT Visit Diagnosis: Other abnormalities of gait and mobility (R26.89)    Time: 5638-9373 PT Time Calculation (min) (ACUTE ONLY): 10 min   Charges:   PT Evaluation $PT Eval Low Complexity: 1 Low          Eduard Clos, PT, DPT  Acute Rehabilitation Services Pager [phone] Office [place] 02/13/2020, 10:05 AM

## 2020-02-18 MED FILL — Heparin Sodium (Porcine) Inj 1000 Unit/ML: INTRAMUSCULAR | Qty: 30 | Status: AC

## 2020-02-18 MED FILL — Sodium Chloride IV Soln 0.9%: INTRAVENOUS | Qty: 1000 | Status: AC

## 2020-03-03 ENCOUNTER — Other Ambulatory Visit: Payer: Self-pay | Admitting: Family Medicine

## 2020-03-09 ENCOUNTER — Other Ambulatory Visit: Payer: Self-pay | Admitting: Family Medicine

## 2020-03-09 DIAGNOSIS — I10 Essential (primary) hypertension: Secondary | ICD-10-CM

## 2020-03-10 DIAGNOSIS — Z20822 Contact with and (suspected) exposure to covid-19: Secondary | ICD-10-CM | POA: Diagnosis not present

## 2020-03-15 DIAGNOSIS — Z981 Arthrodesis status: Secondary | ICD-10-CM | POA: Insufficient documentation

## 2020-03-29 DIAGNOSIS — E890 Postprocedural hypothyroidism: Secondary | ICD-10-CM | POA: Diagnosis not present

## 2020-04-07 ENCOUNTER — Other Ambulatory Visit: Payer: Self-pay | Admitting: *Deleted

## 2020-04-07 MED ORDER — FERROUS SULFATE 325 (65 FE) MG PO TBEC: 325.0000 mg | DELAYED_RELEASE_TABLET | Freq: Two times a day (BID) | ORAL | 3 refills | Status: DC

## 2020-05-03 ENCOUNTER — Other Ambulatory Visit: Payer: Self-pay | Admitting: Family Medicine

## 2020-05-11 DIAGNOSIS — M48062 Spinal stenosis, lumbar region with neurogenic claudication: Secondary | ICD-10-CM | POA: Diagnosis not present

## 2020-05-20 ENCOUNTER — Ambulatory Visit: Payer: Medicare HMO | Admitting: Family Medicine

## 2020-05-24 DIAGNOSIS — E890 Postprocedural hypothyroidism: Secondary | ICD-10-CM | POA: Diagnosis not present

## 2020-05-31 ENCOUNTER — Other Ambulatory Visit: Payer: Self-pay | Admitting: Family Medicine

## 2020-06-01 ENCOUNTER — Telehealth: Payer: Self-pay | Admitting: Pharmacist

## 2020-06-01 NOTE — Telephone Encounter (Signed)
Please Advise

## 2020-06-01 NOTE — Progress Notes (Signed)
  Chronic Care Management Pharmacy Assistant   Name: Autumn Carson  MRN: [id number] DOB: 04/27/1956  Reason for Encounter: Disease State  Patient Questions:  1.  Have you seen any other providers since your last visit? Yes  2.  Any changes in your medicines or health? Yes    PCP : Buena, Kawanta F, MD   Their chronic conditions include: HTN, GERD, DM, hypothyroidism, hyperlipidemia, depression/anxiety, osteoarthritis of knee.  Office Visits: 04-15-2020: OV at [facility]. Medical records were transferred to [place] [facility] on behalf of the patient.  02-02-2020 (PCP): Patient presented in the office with Dr. Littleton for f/u on chronic medical problems. She has chronic back pain with back surgery- with known DDD, she had a fall with pain radiating down left leg and tingling numbness and burning sensation in her legs. Patient reports that she cannot get comfortable at night. Notes indicate she may need iron replacement. Pt to f/u in four months  Consults: 03-29-2020 (Endocrinology) OV with Dr. Kerr  03-15-2020 (Neurosurgery) X-ray of lumbar spine  03-10-2020 - Covid Testing  02-12-2020: Back surgery:  L2-3 decompression and fusion   02-03-2020 (Neurosurgery) OV with Dr. Pool  01-18-2020 (Endocrinology) OV with Dr. Kerr  Allergies:   Allergies  Allergen Reactions  . Lisinopril Anaphylaxis    angioedema    Medications: Outpatient Encounter Medications as of 06/01/2020  Medication Sig Note  . amLODipine (NORVASC) 10 MG tablet TAKE 1 TABLET EVERY DAY   . aspirin 81 MG tablet Take 81 mg by mouth every other day.   . Blood Glucose Monitoring Suppl (TRUE METRIX AIR GLUCOSE METER) w/Device KIT USE AS DIRECTED   . celecoxib (CELEBREX) 100 MG capsule Take 1 capsule (100 mg total) by mouth 2 (two) times daily.   . diclofenac sodium (VOLTAREN) 1 % GEL Apply dime size to knees three times a day as needed (Patient taking differently: Apply 1 application topically 3  (three) times daily as needed (pain). Apply dime size to knees three times a day as needed)   . diclofenac Sodium (VOLTAREN) 1 % GEL APPLY A DIME SIZE TO KNEES THREE TIMES A DAY AS NEEDED   . ferrous sulfate (FE TABS) 325 (65 FE) MG EC tablet Take 1 tablet (325 mg total) by mouth 2 (two) times daily with a meal.   . FLUoxetine (PROZAC) 40 MG capsule TAKE 1 CAPSULE EVERY DAY BEFORE BREAKFAST   . gabapentin (NEURONTIN) 300 MG capsule Take 1 capsule (300 mg total) by mouth 3 (three) times daily. 02/05/2020: ON HOLD   . hydrALAZINE (APRESOLINE) 10 MG tablet Take 1 tablet (10 mg total) by mouth 3 (three) times daily.   . HYDROcodone-acetaminophen (NORCO) 5-325 MG tablet Take 1 tablet by mouth every 6 (six) hours as needed for moderate pain. Chronic Pain. Dx: G89.4   . levothyroxine (SYNTHROID) 175 MCG tablet Take 175 mcg by mouth daily before breakfast.   . metFORMIN (GLUCOPHAGE) 1000 MG tablet TAKE 1 TABLET TWICE DAILY IN THE MORNING  AND IN THE EVENING   . naproxen (NAPROSYN) 500 MG tablet Take 1 tablet (500 mg total) by mouth 2 (two) times daily with a meal. 02/05/2020: ON HOLD   . nebivolol (BYSTOLIC) 10 MG tablet TAKE 1 TABLET EVERY DAY (Patient taking differently: Take 10 mg by mouth daily. TAKE 1 TABLET EVERY DAY)   . omeprazole (PRILOSEC) 40 MG capsule TAKE 1 CAPSULE TWICE DAILY  30  MINUTES  PRIOR  TO  MEALS (Patient taking   differently: Take 40 mg by mouth daily. )   . simvastatin (ZOCOR) 40 MG tablet Take 1 tablet (40 mg total) by mouth daily.   . TRUE METRIX BLOOD GLUCOSE TEST test strip CHECK FINGER STICK BLOOD SUGAR TWICE DAILY AS DIRECTED   . zolpidem (AMBIEN) 5 MG tablet TAKE 1 TABLET EVERY NIGHT AT BEDTIME AS NEEDED FOR SLEEP (Patient taking differently: Take 5 mg by mouth at bedtime as needed for sleep. TAKE 1 TABLET EVERY NIGHT AT BEDTIME AS NEEDED FOR SLEEP)    No facility-administered encounter medications on file as of 06/01/2020.    Current Diagnosis: Patient Active Problem List    Diagnosis Date Noted  . Lumbar foraminal stenosis 02/12/2020  . Chronic insomnia 07/15/2018  . Dysphagia, idiopathic 10/27/2015  . OA (osteoarthritis) of knee 06/23/2014  . Knee pain, right 09/16/2013  . Spinal stenosis, lumbar region, with neurogenic claudication 04/07/2013  . Hypothyroidism 02/10/2013  . Class 3 obesity 02/10/2013  . DDD (degenerative disc disease), lumbar 12/16/2012  . FH: colon cancer 11/26/2012  . Chronic anemia 11/26/2012  . Unspecified constipation 11/26/2012  . Depression with anxiety 07/29/2012  . Diabetic neuropathy (HCC) 06/12/2012  . Leg swelling 06/12/2012  . GERD 04/12/2009  . Diabetes mellitus, type II (HCC) 01/13/2009  . Hyperlipidemia 01/13/2009  . Essential hypertension 01/13/2009  . Osteoarthritis 01/13/2009    Goals Addressed   None    Reviewed chart prior to disease state call. Spoke with patient regarding BP  Recent Office Vitals: BP Readings from Last 3 Encounters:  02/13/20 (!) 125/50  02/10/20 (!) 143/71  02/02/20 138/86   Pulse Readings from Last 3 Encounters:  02/13/20 63  02/10/20 63  02/02/20 68    Wt Readings from Last 3 Encounters:  02/12/20 (!) 314 lb (142.4 kg)  02/10/20 (!) 314 lb 9.6 oz (142.7 kg)  02/02/20 (!) 319 lb (144.7 kg)     Kidney Function Lab Results  Component Value Date/Time   CREATININE 0.80 02/12/2020 10:06 AM   CREATININE 0.92 02/02/2020 09:30 AM   CREATININE 0.90 09/29/2019 10:53 AM   GFRNONAA >60 02/12/2020 10:06 AM   GFRAA >60 02/12/2020 10:06 AM    BMP Latest Ref Rng & Units 02/12/2020 02/02/2020 09/29/2019  Glucose 70 - 99 mg/dL 112(H) 114(H) 115(H)  BUN 8 - 23 mg/dL 10 10 14  Creatinine 0.44 - 1.00 mg/dL 0.80 0.92 0.90  BUN/Creat Ratio 6 - 22 (calc) - NOT APPLICABLE NOT APPLICABLE  Sodium 135 - 145 mmol/L 140 139 141  Potassium 3.5 - 5.1 mmol/L 4.1 4.5 4.3  Chloride 98 - 111 mmol/L 106 104 105  CO2 22 - 32 mmol/L 24 27 26  Calcium 8.9 - 10.3 mg/dL 9.0 8.5(L) 8.6    . Current  antihypertensive regimen:  ? Bystolic 10 mg ? Hydralazine 10 mg tid ? Amlodipine 10 mg  . How often are you checking your Blood Pressure? infrequently   . Current home BP readings: patient did not have current readings but the last number she can recall was 138/90 a few days ago.  . What recent interventions/DTPs have been made by any provider to improve Blood Pressure control since last CPP Visit: none  . Any recent hospitalizations or ED visits since last visit with CPP? Yes   . What diet changes have been made to improve Blood Pressure Control?  o Patient states she has decreased greasy foods  . What exercise is being done to improve your Blood Pressure Control?  o Patient states   she walks often in her neighborhood  Adherence Review: Is the patient currently on ACE/ARB medication? No Does the patient have >5 day gap between last estimated fill dates? No CPP please confirm  Reminded the patient of her appointment with the clinical pharmacist on Monday December 13th at 12:30pm over the telephone.  Follow-Up:  Pharmacist Review   Fanny Skates, [place] Pharmacist Assistant [phone]

## 2020-06-06 ENCOUNTER — Other Ambulatory Visit: Payer: Self-pay

## 2020-06-06 ENCOUNTER — Ambulatory Visit (INDEPENDENT_AMBULATORY_CARE_PROVIDER_SITE_OTHER): Payer: Medicare HMO | Admitting: Family Medicine

## 2020-06-06 ENCOUNTER — Encounter: Payer: Self-pay | Admitting: Family Medicine

## 2020-06-06 VITALS — BP 138/74 | HR 80 | Temp 98.0°F | Resp 14 | Ht 67.0 in | Wt 327.0 lb

## 2020-06-06 DIAGNOSIS — F5104 Psychophysiologic insomnia: Secondary | ICD-10-CM

## 2020-06-06 DIAGNOSIS — E669 Obesity, unspecified: Secondary | ICD-10-CM

## 2020-06-06 DIAGNOSIS — E782 Mixed hyperlipidemia: Secondary | ICD-10-CM

## 2020-06-06 DIAGNOSIS — E1143 Type 2 diabetes mellitus with diabetic autonomic (poly)neuropathy: Secondary | ICD-10-CM | POA: Diagnosis not present

## 2020-06-06 DIAGNOSIS — M5136 Other intervertebral disc degeneration, lumbar region: Secondary | ICD-10-CM | POA: Diagnosis not present

## 2020-06-06 DIAGNOSIS — I10 Essential (primary) hypertension: Secondary | ICD-10-CM | POA: Diagnosis not present

## 2020-06-06 DIAGNOSIS — E038 Other specified hypothyroidism: Secondary | ICD-10-CM

## 2020-06-06 DIAGNOSIS — M51369 Other intervertebral disc degeneration, lumbar region without mention of lumbar back pain or lower extremity pain: Secondary | ICD-10-CM

## 2020-06-06 DIAGNOSIS — E66813 Obesity, class 3: Secondary | ICD-10-CM

## 2020-06-06 MED ORDER — HYDROCODONE-ACETAMINOPHEN 5-325 MG PO TABS: 1.0000 | ORAL_TABLET | Freq: Four times a day (QID) | ORAL | 0 refills | Status: DC | PRN

## 2020-06-06 MED ORDER — GABAPENTIN 300 MG PO CAPS: 300.0000 mg | ORAL_CAPSULE | Freq: Three times a day (TID) | ORAL | 1 refills | Status: DC

## 2020-06-06 MED ORDER — CELECOXIB 100 MG PO CAPS: 100.0000 mg | ORAL_CAPSULE | Freq: Two times a day (BID) | ORAL | 1 refills | Status: DC

## 2020-06-06 NOTE — Assessment & Plan Note (Signed)
Concern for weight gain in setting of thyroid replacement questions Will check TFT and send to her endocrinologist to help sort out dosing

## 2020-06-06 NOTE — Assessment & Plan Note (Signed)
Mildly elevated systolic, no change to meds

## 2020-06-06 NOTE — Patient Instructions (Addendum)
F/U 4 months for Physical We will call with lab results  

## 2020-06-06 NOTE — Progress Notes (Signed)
Subjective:    Patient ID: Autumn Carson, female    DOB: 09-28-1955, 64 y.o.   MRN: [id number]  Patient presents for Follow-up (is fasting), R Sided Back Pain (x weeks), and Knee Pain (B knees)  Pt here to f/u chronic medical problems  She had back surgery in July, she stil has back pain, has appt in 1 month  Still has  right sided back , no difficulty urinating  , no bowel changes , no UTI symptoms She was on Celebrex and norco  But ran out of refills  She continues to f/u with her endocrinologist, she said her dose was changed in August , she was on  121mcg and it was increased,  States she was told to take  253mcg plus the 173mcg  But she felt weird on that high of a dose , so now back on 144mcg She reached out to endocrinology but has not heard back She has also noticed increase in weight, she feels hungry   She had dizzy spells, did some manuvers for vertigo with her sister and that helped.  HTN- she has been taking her bp meds as prescribed  DM- taking metformin, did not bring meter with her today , states blood sugar has been good She is due for eye exam she is going to schedule   Chronic knee pain has known OA, needs knee surgery has orthopedic        Review Of Systems:  GEN- denies fatigue, fever, weight loss,weakness, recent illness HEENT- denies eye drainage, change in vision, nasal discharge, CVS- denies chest pain, palpitations RESP- denies SOB, cough, wheeze ABD- denies N/V, change in stools, abd pain GU- denies dysuria, hematuria, dribbling, incontinence MSK-+ joint pain, muscle aches, injury Neuro- denies headache, dizziness, syncope, seizure activity       Objective:    BP 138/74   Pulse 80   Temp 98 F (36.7 C) (Temporal)   Resp 14   Ht 5\' 7"  (1.702 m)   Wt (!) 327 lb (148.3 kg)   LMP 09/07/2011   SpO2 94%   BMI 51.22 kg/m  GEN- NAD, alert and oriented x3 HEENT- PERRL, EOMI, non injected sclera, pink conjunctiva, MMM, oropharynx  clear Neck- Supple,  CVS- RRR, no murmur RESP-CTAB ABD-NABS,soft,NT,ND , no CVA tenderness MSK- Lower lumbar incision D/C/I mild ttp right paraspinals  NEURO-CNII-XII in tact no focal deficits  EXT- trace ankle edema Pulses- Radial, DP- 2+        Assessment & Plan:      Problem List Items Addressed This Visit      Unprioritized   Chronic insomnia    Prn ambien       Class 3 obesity    Concern for weight gain in setting of thyroid replacement questions Will check TFT and send to her endocrinologist to help sort out dosing       DDD (degenerative disc disease), lumbar    Recent back surgery Overall doing well Refilled celebrex and norco Known OA knees, defer to ortho      Relevant Medications   celecoxib (CELEBREX) 100 MG capsule   HYDROcodone-acetaminophen (NORCO) 5-325 MG tablet   Diabetes mellitus, type II (HCC)    Continue metformin, goal < 7% On ASA and statin Gabapentin for back pain and neuropathy Pt to schedule eye exam       Relevant Orders   Comprehensive metabolic panel   Hemoglobin A1c   Microalbumin / creatinine urine ratio   Urinalysis, Routine  w reflex microscopic   Diabetic neuropathy ([place])   Essential hypertension - Primary    Mildly elevated systolic, no change to meds       Relevant Orders   CBC with Differential/Platelet   Hyperlipidemia   Relevant Orders   Lipid panel   Hypothyroidism   Relevant Medications   levothyroxine (SYNTHROID) 200 MCG tablet   Other Relevant Orders   TSH   T3, free   T4, free      Note: This dictation was prepared with Dragon dictation along with smaller phrase technology. Any transcriptional errors that result from this process are unintentional.

## 2020-06-06 NOTE — Assessment & Plan Note (Signed)
Continue metformin, goal < 7% On ASA and statin Gabapentin for back pain and neuropathy Pt to schedule eye exam

## 2020-06-06 NOTE — Assessment & Plan Note (Signed)
Prn Lorrin Mais

## 2020-06-06 NOTE — Assessment & Plan Note (Signed)
Recent back surgery Overall doing well Refilled celebrex and norco Known OA knees, defer to ortho

## 2020-06-07 LAB — MICROALBUMIN / CREATININE URINE RATIO
Creatinine, Urine: 119 mg/dL (ref 20–275)
Microalb Creat Ratio: 128 mcg/mg creat — ABNORMAL HIGH (ref <30)
Microalb, Ur: 15.2 mg/dL

## 2020-06-07 LAB — CBC WITH DIFFERENTIAL/PLATELET
Absolute Monocytes: 459 cells/uL (ref 200–950)
Basophils Absolute: 31 cells/uL (ref 0–200)
Basophils Relative: 0.6 %
Eosinophils Absolute: 219 cells/uL (ref 15–500)
Eosinophils Relative: 4.3 %
HCT: 35 % (ref 35.0–45.0)
Hemoglobin: 10.9 g/dL — ABNORMAL LOW (ref 11.7–15.5)
Lymphs Abs: 1438 cells/uL (ref 850–3900)
MCH: 25.4 pg — ABNORMAL LOW (ref 27.0–33.0)
MCHC: 31.1 g/dL — ABNORMAL LOW (ref 32.0–36.0)
MCV: 81.6 fL (ref 80.0–100.0)
MPV: 11.7 fL (ref 7.5–12.5)
Monocytes Relative: 9 %
Neutro Abs: 2953 cells/uL (ref 1500–7800)
Neutrophils Relative %: 57.9 %
Platelets: 263 10*3/uL (ref 140–400)
RBC: 4.29 10*6/uL (ref 3.80–5.10)
RDW: 14.7 % (ref 11.0–15.0)
Total Lymphocyte: 28.2 %
WBC: 5.1 10*3/uL (ref 3.8–10.8)

## 2020-06-07 LAB — COMPREHENSIVE METABOLIC PANEL
AG Ratio: 1.7 (calc) (ref 1.0–2.5)
ALT: 16 U/L (ref 6–29)
AST: 17 U/L (ref 10–35)
Albumin: 4.2 g/dL (ref 3.6–5.1)
Alkaline phosphatase (APISO): 72 U/L (ref 37–153)
BUN: 11 mg/dL (ref 7–25)
CO2: 26 mmol/L (ref 20–32)
Calcium: 9.3 mg/dL (ref 8.6–10.4)
Chloride: 103 mmol/L (ref 98–110)
Creat: 0.84 mg/dL (ref 0.50–0.99)
Globulin: 2.5 g/dL (calc) (ref 1.9–3.7)
Glucose, Bld: 135 mg/dL — ABNORMAL HIGH (ref 65–99)
Potassium: 4.8 mmol/L (ref 3.5–5.3)
Sodium: 139 mmol/L (ref 135–146)
Total Bilirubin: 0.3 mg/dL (ref 0.2–1.2)
Total Protein: 6.7 g/dL (ref 6.1–8.1)

## 2020-06-07 LAB — HEMOGLOBIN A1C
Hgb A1c MFr Bld: 6.7 % of total Hgb — ABNORMAL HIGH (ref <5.7)
Mean Plasma Glucose: 146 (calc)
eAG (mmol/L): 8.1 (calc)

## 2020-06-07 LAB — URINALYSIS, ROUTINE W REFLEX MICROSCOPIC
Bilirubin Urine: NEGATIVE
Glucose, UA: NEGATIVE
Hyaline Cast: NONE SEEN /LPF
Ketones, ur: NEGATIVE
Nitrite: POSITIVE — AB
Specific Gravity, Urine: 1.015 (ref 1.001–1.03)
Squamous Epithelial / HPF: NONE SEEN /HPF (ref <=5)
pH: 5.5 (ref 5.0–8.0)

## 2020-06-07 LAB — T3, FREE: T3, Free: 2.9 pg/mL (ref 2.3–4.2)

## 2020-06-07 LAB — LIPID PANEL
Cholesterol: 225 mg/dL — ABNORMAL HIGH (ref <200)
HDL: 54 mg/dL (ref >=50)
LDL Cholesterol (Calc): 140 mg/dL (calc) — ABNORMAL HIGH
Non-HDL Cholesterol (Calc): 171 mg/dL (calc) — ABNORMAL HIGH (ref <130)
Total CHOL/HDL Ratio: 4.2 (calc) (ref <5.0)
Triglycerides: 168 mg/dL — ABNORMAL HIGH (ref <150)

## 2020-06-07 LAB — TSH: TSH: 0.18 mIU/L — ABNORMAL LOW (ref 0.40–4.50)

## 2020-06-07 LAB — T4, FREE: Free T4: 1.3 ng/dL (ref 0.8–1.8)

## 2020-06-08 ENCOUNTER — Other Ambulatory Visit: Payer: Self-pay | Admitting: *Deleted

## 2020-06-08 NOTE — Telephone Encounter (Signed)
Received fax requesting refill on Naproxen from mail order.   Ok to refill?

## 2020-06-08 NOTE — Telephone Encounter (Signed)
Pt is on celebrex, she can not be on 2 NSAIDS, therefore NAprosyn discontined

## 2020-06-09 ENCOUNTER — Other Ambulatory Visit: Payer: Self-pay | Admitting: *Deleted

## 2020-06-09 MED ORDER — CEPHALEXIN 500 MG PO CAPS
500.0000 mg | ORAL_CAPSULE | Freq: Four times a day (QID) | ORAL | 0 refills | Status: AC
Start: 2020-06-09 — End: 2020-06-14

## 2020-06-09 MED ORDER — ATORVASTATIN CALCIUM 40 MG PO TABS
40.0000 mg | ORAL_TABLET | Freq: Every day | ORAL | 3 refills | Status: DC
Start: 2020-06-09 — End: 2021-11-27

## 2020-06-16 ENCOUNTER — Telehealth: Payer: Self-pay | Admitting: *Deleted

## 2020-06-16 NOTE — Telephone Encounter (Signed)
Received PA determination.   PA [id number] approved 07/31/2019- 07/29/2021.

## 2020-06-16 NOTE — Telephone Encounter (Signed)
Received request from pharmacy for PA on Hydrocodone/APAP.  PA submitted.   Dx: M51.36- DDD, Lumbar.   Your information has been submitted to [facility]. [employer] will review the request and will issue a decision, typically within 3-7 days from your submission. You can check the updated outcome later by reopening this request.  If [employer] has not responded in 3-7 days or if you have any questions about your ePA request, please contact [employer] at [phone]. If you think there may be a problem with your PA request, use our live chat feature at the bottom right.  For [place] requests, please call [phone].

## 2020-06-28 DIAGNOSIS — E119 Type 2 diabetes mellitus without complications: Secondary | ICD-10-CM | POA: Diagnosis not present

## 2020-06-28 DIAGNOSIS — H5213 Myopia, bilateral: Secondary | ICD-10-CM | POA: Diagnosis not present

## 2020-06-28 DIAGNOSIS — H40022 Open angle with borderline findings, high risk, left eye: Secondary | ICD-10-CM | POA: Diagnosis not present

## 2020-07-01 NOTE — Chronic Care Management (AMB) (Signed)
Chronic Care Management Pharmacy  Name: DAVEY BERGSMA  MRN: [id number] DOB: 12-07-1955  Chief Complaint/ HPI  Autumn Carson,  64 y.o. , female presents for their Follow-Up CCM visit with the clinical pharmacist via telephone.  PCP : Alycia Rossetti, MD  Their chronic conditions include: HTN, GERD, DM, hypothyroidism, hyperlipidemia, depression/anxiety, osteoarthritis of knee.  Office Visits: 06/06/2020 [facility]) - presents for f/u complains of R sided back pain and knee pain.  Back surgery in July.  Reports she was taking the 175 mcg and 266mg of her levothyroxine as directed by endocrinologist, however she felt weird so she went back to the 1742m dose.  [facility], fairly well controlled DM with neuropathy, has cut down on sugary beverages, on statin, son and daughter are POA  Consult Visit:none recent   Medications: Outpatient Encounter Medications as of 07/11/2020  Medication Sig  . amLODipine (NORVASC) 10 MG tablet TAKE 1 TABLET EVERY DAY  . aspirin 81 MG tablet Take 81 mg by mouth every other day.  . Marland Kitchentorvastatin (LIPITOR) 40 MG tablet Take 1 tablet (40 mg total) by mouth daily.  . Blood Glucose Monitoring Suppl (TRUE METRIX AIR GLUCOSE METER) w/Device KIT USE AS DIRECTED  . celecoxib (CELEBREX) 100 MG capsule Take 1 capsule (100 mg total) by mouth 2 (two) times daily.  . diclofenac sodium (VOLTAREN) 1 % GEL Apply dime size to knees three times a day as needed (Patient taking differently: Apply 1 application topically 3 (three) times daily as needed (pain). Apply dime size to knees three times a day as needed)  . ferrous sulfate (FE TABS) 325 (65 FE) MG EC tablet Take 1 tablet (325 mg total) by mouth 2 (two) times daily with a meal.  . FLUoxetine (PROZAC) 40 MG capsule TAKE 1 CAPSULE EVERY DAY BEFORE BREAKFAST  . gabapentin (NEURONTIN) 300 MG capsule Take 1 capsule (300 mg total) by mouth 3 (three) times daily.  . hydrALAZINE  (APRESOLINE) 10 MG tablet Take 1 tablet (10 mg total) by mouth 3 (three) times daily.  . Marland KitchenYDROcodone-acetaminophen (NORCO) 5-325 MG tablet Take 1 tablet by mouth every 6 (six) hours as needed for moderate pain. Chronic Pain. Dx: G89.4  . levothyroxine (SYNTHROID) 175 MCG tablet Take 175 mcg by mouth daily before breakfast.  . levothyroxine (SYNTHROID) 200 MCG tablet Take 200 mcg by mouth daily before breakfast.  . metFORMIN (GLUCOPHAGE) 1000 MG tablet TAKE 1 TABLET TWICE DAILY IN THE MORNING  AND IN THE EVENING  . nebivolol (BYSTOLIC) 10 MG tablet TAKE 1 TABLET EVERY DAY (Patient taking differently: Take 10 mg by mouth daily. TAKE 1 TABLET EVERY DAY)  . omeprazole (PRILOSEC) 40 MG capsule TAKE 1 CAPSULE TWICE DAILY  30  MINUTES  PRIOR  TO  MEALS (Patient taking differently: Take 40 mg by mouth daily. )  . TRUE METRIX BLOOD GLUCOSE TEST test strip CHECK FINGER STICK BLOOD SUGAR TWICE DAILY AS DIRECTED  . zolpidem (AMBIEN) 5 MG tablet TAKE 1 TABLET EVERY NIGHT AT BEDTIME AS NEEDED FOR SLEEP (Patient taking differently: Take 5 mg by mouth at bedtime as needed for sleep. TAKE 1 TABLET EVERY NIGHT AT BEDTIME AS NEEDED FOR SLEEP)   No facility-administered encounter medications on file as of 07/11/2020.     Current Diagnosis/Assessment:  FiEmergency planning/management officertrain: Low Risk   . Difficulty of Paying Living Expenses: Not very hard     Goals Addressed  This Visit's Progress   . Pharmacy Care Plan:       CARE PLAN ENTRY  Current Barriers:  . Chronic Disease Management support, education, and care coordination needs related to Hypertension, Hyperlipidemia, Diabetes, and Hypothyroidism   Hypertension . Pharmacist Clinical Goal(s): o Over the next 180 days, patient will work with PharmD and providers to maintain BP goal <130/80 . Current regimen:  o Bystolic 71IR o Hydralazine 43m tid o Amlodipine 138m. Interventions: o Comprehensive medication review o Continue current  therapy and monitoring plan . Patient self care activities - Over the next 180 days, patient will: o Check BP when feeling symptomatic, document, and provide at future appointments o Ensure daily salt intake < 2300 mg/day o Contact providers with consistent blood pressure readings > 130/80.  Hyperlipidemia . Pharmacist Clinical Goal(s): o Over the next 120 days, patient will work with PharmD and providers to achieve LDL goal < 100 . Current regimen:  o Atorvastatin 4082m Interventions: o Comprehensive medication review o Evaluated for myalgias o Reviewed most recent lipid panel with patient . Patient self care activities - Over the next 180 days, patient will: o Continue to take medications as prescribed o Contact PharmD or PCP with any medication related concerns  Diabetes . Pharmacist Clinical Goal(s): o Over the next 180 days, patient will work with PharmD and providers to maintain A1c goal <7% . Current regimen:  o Metformin 1000m67mice daily . Interventions: o Discussed diabetic diet and current exercise plan o Comprehensive medication review o Discussed limiting carbs and sugary beverages . Patient self care activities - Over the next 120 days, patient will: o Check blood sugar once or twice daily, document, and provide at future appointments o Contact provider with any episodes of hypoglycemia or and consistent fasting blood glucose > 130 mg/dl  Hypothyroidism . Pharmacist Clinical Goal(s) o Over the next 120 days, patient will work with PharmD and providers to optimize medication and minimize symptoms related to hypothyroidism. . Current regimen:  o Levothyroxine 175 mcg . Interventions: o Comprehensive medication review o Counseled on appropriate administration time  o Will consult with PCP management of thyroid medication and next steps . Patient self care activities - Over the next 120 days, patient will: o Continue to take medication as directed o Try to get  in to see endocrinologist earlier than April    Please see past updates related to this goal by clicking on the "Past Updates" button in the selected goal         Diabetes   Recent Relevant Labs: Lab Results  Component Value Date/Time   HGBA1C 6.7 (H) 06/06/2020 09:27 AM   HGBA1C 6.2 (H) 02/02/2020 09:30 AM   HGBA1C 6.2 07/26/2016 12:00 AM   MICROALBUR 15.2 06/06/2020 09:28 AM   MICROALBUR 23.5 05/25/2019 03:48 PM     Checking BG: 2x per Day  Recent FBG Readings: 112, 121 most < 130  Recent 2hr PP BG readings:  155-160s  Patient has failed these meds in past: none noted Patient is currently controlled on the following medications:   metformin 1000 twice daily  Last diabetic Foot exam:  Lab Results  Component Value Date/Time   HMDIABEYEEXA No Retinopathy 02/14/2018 12:00 AM    Last diabetic Eye exam: No results found for: HMDIABFOOTEX   We discussed:  Goals of therapy FBG < 130 and 2 hr PPG < 180.  Patient reports all of her readings have been within these goals with the majority of  her glucose readings being < 160.  She denies any episodes of hypoglycemia.  Still watching her sugary beverages and carbohydrates.  Holidays she loves egg nog but discussed enjoying it in moderation.  Reviewed most recent A1c which is at goal at 6.7 but did trend up from her last one.  Patient aware and will get back to being mores strict with her diet. Plan  Continue current medications and control with diet and exercise.   Continue to monitor BG once or twice daily.   Watch carbohydrates and sugary beverages  Hyperlipidemia   Lipid Panel     Component Value Date/Time   CHOL 225 (H) 06/06/2020 0927   TRIG 168 (H) 06/06/2020 0927   HDL 54 06/06/2020 0927   LDLCALC 140 (H) 06/06/2020 0927     The 10-year ASCVD risk score Mikey Bussing DC Jr., et al., 2013) is: 23.3%   Values used to calculate the score:     Age: 31 years     Sex: Female     Is Non-Hispanic African American: Yes      Diabetic: Yes     Tobacco smoker: No     Systolic Blood Pressure: 885 mmHg     Is BP treated: Yes     HDL Cholesterol: 54 mg/dL     Total Cholesterol: 225 mg/dL   Patient has failed these meds in past: none noted Patient is currently controlled on the following medications:  . Atorvastatin 25m  We discussed:  Patient is adherent to new therapy of Atorvastatin 438mdaily  She denies myalgias  We discussed the importance of statin medications and adherence to prevent cardiovascular complications.  Reviewed most recent lipid panel and discussed goals.  Patient agreeable to work on processed and other foods high in cholesterol  Plan  Continue current medications  Focus on medication adherence  Hypothyroidism   Lab Results  Component Value Date/Time   TSH 0.18 (L) 06/06/2020 09:27 AM   TSH 0.304 (L) 04/15/2016 05:19 PM   TSH <0.008 uIU/mL (L) 10/18/2010 02:09 PM   TSH 2.648 11/14/2009 09:52 PM    Patient has failed these meds in past: none noted Patient is currently uncontrolled on the following medications:  Levothyroxine 175 mcg  We discussed:  Patient reports that endocrinologist instructed her to alternate between 175 mcg and 200 mcg every other day.  She did this for a little bit and started to feel off so now she is just taking 175 mcg.  TSH was low in early November and patient continues to be on the same dose of levothyroxine.  Will discuss with PCP whether to reach out to endocrinologist or if we should re-check TSH and adjust her levothyroxine accordingly.  Patient verbalizes she takes it in the morning by itself before food or other meds.   Plan  Continue current medications.   Consult with PCP on management strategy for overactive thyroid based on last TSH  Hypertension   Office blood pressures are  BP Readings from Last 3 Encounters:  06/06/20 138/74  02/13/20 (!) 125/50  02/10/20 (!) 143/71    Patient has failed these meds in the past:  lisinopril 4084mPatient checks BP at home when feeling symptomatic  Patient home BP readings are ranging: patient did not have logs but reported all readings are WNL  Patient is currently controlled on the following medications:  Bystolic 47m02DXydralazine 47m36mree times daily  amlodipine 10 mg daily  Slight swelling with amlodipine, however not significant  and manageable per patient.  Plan  Continue current medications. Contact PharmD with increased swelling, continue to monitor BP and contact provider with BP > 130/80.     GERD    Patient has failed these meds in past: none noted Patient is currently controlled on the following medications: omeprazole 39m daily  Currently taking every other day with minimal symptoms.  Plan  Continue current medications  Vaccines   Reviewed and discussed patient's vaccination history.    Immunization History  Administered Date(s) Administered  . Influenza Split 03/30/2014  . Influenza,inj,Quad PF,6+ Mos 04/17/2016, 05/30/2017, 04/30/2019, 04/09/2020  . Influenza,trivalent, recombinat, inj, PF 05/08/2015  . Influenza-Unspecified 05/10/2018  . Moderna Sars-Covid-2 Vaccination 09/29/2019, 10/27/2019, 05/31/2020  . Pneumococcal Polysaccharide-23 05/13/2013    Plan  Recommended patient receive shingles vaccine in office/pharmacy.  Medication Management   . Miscellaneous medications: zolpidem 538m methocarbamol 5007mgabapentin 300m45m. OTC's: ASA 81mg35matient currently uses [facility].  Phone #  [phone] reports using pill pouch for her bottles method to organize medications and promote adherence. . Patient denies missed doses of medication.   ChrisBeverly MilchrmD Clinical Pharmacist [place])[phone]

## 2020-07-11 ENCOUNTER — Ambulatory Visit: Payer: Medicare HMO | Admitting: Pharmacist

## 2020-07-11 DIAGNOSIS — E038 Other specified hypothyroidism: Secondary | ICD-10-CM

## 2020-07-11 DIAGNOSIS — E1143 Type 2 diabetes mellitus with diabetic autonomic (poly)neuropathy: Secondary | ICD-10-CM

## 2020-07-11 DIAGNOSIS — E782 Mixed hyperlipidemia: Secondary | ICD-10-CM

## 2020-07-11 NOTE — Patient Instructions (Addendum)
Visit Information  Goals Addressed            This Visit's Progress   . Pharmacy Care Plan:       CARE PLAN ENTRY  Current Barriers:  . Chronic Disease Management support, education, and care coordination needs related to Hypertension, Hyperlipidemia, Diabetes, and Hypothyroidism   Hypertension . Pharmacist Clinical Goal(s): o Over the next 180 days, patient will work with PharmD and providers to maintain BP goal <130/80 . Current regimen:  o Bystolic 10mg  o Hydralazine 10mg  tid o Amlodipine 10mg  . Interventions: o Comprehensive medication review o Continue current therapy and monitoring plan . Patient self care activities - Over the next 180 days, patient will: o Check BP when feeling symptomatic, document, and provide at future appointments o Ensure daily salt intake < 2300 mg/day o Contact providers with consistent blood pressure readings > 130/80.  Hyperlipidemia . Pharmacist Clinical Goal(s): o Over the next 120 days, patient will work with PharmD and providers to achieve LDL goal < 100 . Current regimen:  o Atorvastatin 40mg  . Interventions: o Comprehensive medication review o Evaluated for myalgias o Reviewed most recent lipid panel with patient . Patient self care activities - Over the next 180 days, patient will: o Continue to take medications as prescribed o Contact PharmD or PCP with any medication related concerns  Diabetes . Pharmacist Clinical Goal(s): o Over the next 180 days, patient will work with PharmD and providers to maintain A1c goal <7% . Current regimen:  o Metformin 1000mg  twice daily . Interventions: o Discussed diabetic diet and current exercise plan o Comprehensive medication review o Discussed limiting carbs and sugary beverages . Patient self care activities - Over the next 120 days, patient will: o Check blood sugar once or twice daily, document, and provide at future appointments o Contact provider with any episodes of hypoglycemia  or and consistent fasting blood glucose > 130 mg/dl  Hypothyroidism . Pharmacist Clinical Goal(s) o Over the next 120 days, patient will work with PharmD and providers to optimize medication and minimize symptoms related to hypothyroidism. . Current regimen:  o Levothyroxine 175 mcg . Interventions: o Comprehensive medication review o Counseled on appropriate administration time  o Will consult with PCP management of thyroid medication and next steps . Patient self care activities - Over the next 120 days, patient will: o Continue to take medication as directed o Try to get in to see endocrinologist earlier than April    Please see past updates related to this goal by clicking on the "Past Updates" button in the selected goal         The patient verbalized understanding of instructions, educational materials, and care plan provided today and agreed to receive a mailed copy of patient instructions, educational materials, and care plan.   Telephone follow up appointment with pharmacy team member scheduled for:   Beverly Milch, PharmD Clinical Pharmacist [facility] [phone]   High Cholesterol  High cholesterol is a condition in which the blood has high levels of a white, waxy, fat-like substance (cholesterol). The human body needs small amounts of cholesterol. The liver makes all the cholesterol that the body needs. Extra (excess) cholesterol comes from the food that we eat. Cholesterol is carried from the liver by the blood through the blood vessels. If you have high cholesterol, deposits (plaques) may build up on the walls of your blood vessels (arteries). Plaques make the arteries narrower and stiffer. Cholesterol plaques increase your risk for  heart attack and stroke. Work with your health care provider to keep your cholesterol levels in a healthy range. What increases the risk? This condition is more likely to develop in people who:  Eat foods that  are high in animal fat (saturated fat) or cholesterol.  Are overweight.  Are not getting enough exercise.  Have a family history of high cholesterol. What are the signs or symptoms? There are no symptoms of this condition. How is this diagnosed? This condition may be diagnosed from the results of a blood test.  If you are older than age 72, your health care provider may check your cholesterol every 4-6 years.  You may be checked more often if you already have high cholesterol or other risk factors for heart disease. The blood test for cholesterol measures:  "Bad" cholesterol (LDL cholesterol). This is the main type of cholesterol that causes heart disease. The desired level for LDL is less than 100.  "Good" cholesterol (HDL cholesterol). This type helps to protect against heart disease by cleaning the arteries and carrying the LDL away. The desired level for HDL is 60 or higher.  Triglycerides. These are fats that the body can store or burn for energy. The desired number for triglycerides is lower than 150.  Total cholesterol. This is a measure of the total amount of cholesterol in your blood, including LDL cholesterol, HDL cholesterol, and triglycerides. A healthy number is less than 200. How is this treated? This condition is treated with diet changes, lifestyle changes, and medicines. Diet changes  This may include eating more whole grains, fruits, vegetables, nuts, and fish.  This may also include cutting back on red meat and foods that have a lot of added sugar. Lifestyle changes  Changes may include getting at least 40 minutes of aerobic exercise 3 times a week. Aerobic exercises include walking, biking, and swimming. Aerobic exercise along with a healthy diet can help you maintain a healthy weight.  Changes may also include quitting smoking. Medicines  Medicines are usually given if diet and lifestyle changes have failed to reduce your cholesterol to healthy  levels.  Your health care provider may prescribe a statin medicine. Statin medicines have been shown to reduce cholesterol, which can reduce the risk of heart disease. Follow these instructions at home: Eating and drinking If told by your health care provider:  Eat chicken (without skin), fish, veal, shellfish, ground [place] breast, and round or loin cuts of red meat.  Do not eat fried foods or fatty meats, such as hot dogs and salami.  Eat plenty of fruits, such as apples.  Eat plenty of vegetables, such as broccoli, potatoes, and carrots.  Eat beans, peas, and lentils.  Eat grains such as barley, rice, couscous, and bulgur wheat.  Eat pasta without cream sauces.  Use skim or nonfat milk, and eat low-fat or nonfat yogurt and cheeses.  Do not eat or drink whole milk, cream, ice cream, egg yolks, or hard cheeses.  Do not eat stick margarine or tub margarines that contain trans fats (also called partially hydrogenated oils).  Do not eat saturated tropical oils, such as coconut oil and palm oil.  Do not eat cakes, cookies, crackers, or other baked goods that contain trans fats.  General instructions  Exercise as directed by your health care provider. Increase your activity level with activities such as gardening, walking, and taking the stairs.  Take over-the-counter and prescription medicines only as told by your health care provider.  Do not  use any products that contain nicotine or tobacco, such as cigarettes and e-cigarettes. If you need help quitting, ask your health care provider.  Keep all follow-up visits as told by your health care provider. This is important. Contact a health care provider if:  You are struggling to maintain a healthy diet or weight.  You need help to start on an exercise program.  You need help to stop smoking. Get help right away if:  You have chest pain.  You have trouble breathing. This information is not intended to replace advice given  to you by your health care provider. Make sure you discuss any questions you have with your health care provider. Document Revised: 07/19/2017 Document Reviewed: 01/14/2016 Elsevier Patient Education  [place].

## 2020-07-13 DIAGNOSIS — E890 Postprocedural hypothyroidism: Secondary | ICD-10-CM | POA: Diagnosis not present

## 2020-07-13 DIAGNOSIS — M48062 Spinal stenosis, lumbar region with neurogenic claudication: Secondary | ICD-10-CM | POA: Diagnosis not present

## 2020-07-13 DIAGNOSIS — Z6841 Body Mass Index (BMI) 40.0 and over, adult: Secondary | ICD-10-CM | POA: Diagnosis not present

## 2020-07-27 ENCOUNTER — Telehealth: Payer: Self-pay | Admitting: Family Medicine

## 2020-07-27 NOTE — Telephone Encounter (Signed)
Call placed to [facility] to inquire.   Advised that no information has been forthcoming about Bystolic being dropped. Reports that no backorders noted.   Medication refilled and is in process to patient.

## 2020-07-27 NOTE — Telephone Encounter (Signed)
Pharmacy advise pt to call her primary doctor  [employer] will no longer have Bystolic in stock need Dr.Peterson to fax over  another rx to be prescribe

## 2020-07-27 NOTE — Telephone Encounter (Signed)
Call placed to patient. LMTRC.  

## 2020-07-28 NOTE — Telephone Encounter (Signed)
Call placed to patient and patient made aware.  

## 2020-08-26 ENCOUNTER — Other Ambulatory Visit: Payer: Self-pay | Admitting: Family Medicine

## 2020-08-31 ENCOUNTER — Telehealth: Payer: Self-pay | Admitting: Pharmacist

## 2020-08-31 NOTE — Progress Notes (Addendum)
Chronic Care Management Pharmacy Assistant   Name: Autumn Carson  MRN: [id number] DOB: 07-19-1956  Reason for Encounter: Disease State For DM.  Patient Questions:  1.  Have you seen any other providers since your last visit? No.   2.  Any changes in your medicines or health? No.    PCP : Alycia Rossetti, MD   Their chronic conditions include: HTN, GERD, DM, hypothyroidism, hyperlipidemia, depression/anxiety, osteoarthritis of knee.  Office Visits: None since 07/11/20  Consults: None since 07/11/20  Allergies:   Allergies  Allergen Reactions   Lisinopril Anaphylaxis    angioedema    Medications: Outpatient Encounter Medications as of 08/31/2020  Medication Sig   amLODipine (NORVASC) 10 MG tablet TAKE 1 TABLET EVERY DAY   aspirin 81 MG tablet Take 81 mg by mouth every other day.   atorvastatin (LIPITOR) 40 MG tablet Take 1 tablet (40 mg total) by mouth daily.   Blood Glucose Monitoring Suppl (TRUE METRIX AIR GLUCOSE METER) w/Device KIT USE AS DIRECTED   celecoxib (CELEBREX) 100 MG capsule Take 1 capsule (100 mg total) by mouth 2 (two) times daily.   diclofenac sodium (VOLTAREN) 1 % GEL Apply dime size to knees three times a day as needed (Patient taking differently: Apply 1 application topically 3 (three) times daily as needed (pain). Apply dime size to knees three times a day as needed)   ferrous sulfate (FE TABS) 325 (65 FE) MG EC tablet Take 1 tablet (325 mg total) by mouth 2 (two) times daily with a meal.   FLUoxetine (PROZAC) 40 MG capsule TAKE 1 CAPSULE EVERY DAY BEFORE BREAKFAST   gabapentin (NEURONTIN) 300 MG capsule TAKE 1 CAPSULE THREE TIMES DAILY  (DOSE  CHANGE)   hydrALAZINE (APRESOLINE) 10 MG tablet Take 1 tablet (10 mg total) by mouth 3 (three) times daily.   HYDROcodone-acetaminophen (NORCO) 5-325 MG tablet Take 1 tablet by mouth every 6 (six) hours as needed for moderate pain. Chronic Pain. Dx: G89.4   levothyroxine (SYNTHROID) 175 MCG tablet Take  175 mcg by mouth daily before breakfast.   levothyroxine (SYNTHROID) 200 MCG tablet Take 200 mcg by mouth daily before breakfast.   metFORMIN (GLUCOPHAGE) 1000 MG tablet TAKE 1 TABLET TWICE DAILY IN THE MORNING  AND IN THE EVENING   nebivolol (BYSTOLIC) 10 MG tablet TAKE 1 TABLET EVERY DAY (Patient taking differently: Take 10 mg by mouth daily. TAKE 1 TABLET EVERY DAY)   omeprazole (PRILOSEC) 40 MG capsule TAKE 1 CAPSULE TWICE DAILY  30  MINUTES  PRIOR  TO  MEALS   TRUE METRIX BLOOD GLUCOSE TEST test strip CHECK FINGER STICK BLOOD SUGAR TWICE DAILY AS DIRECTED   zolpidem (AMBIEN) 5 MG tablet TAKE 1 TABLET EVERY NIGHT AT BEDTIME AS NEEDED FOR SLEEP (Patient taking differently: Take 5 mg by mouth at bedtime as needed for sleep. TAKE 1 TABLET EVERY NIGHT AT BEDTIME AS NEEDED FOR SLEEP)   No facility-administered encounter medications on file as of 08/31/2020.    Current Diagnosis: Patient Active Problem List   Diagnosis Date Noted   Lumbar foraminal stenosis 02/12/2020   Chronic insomnia 07/15/2018   Dysphagia, idiopathic 10/27/2015   OA (osteoarthritis) of knee 06/23/2014   Knee pain, right 09/16/2013   Spinal stenosis, lumbar region, with neurogenic claudication 04/07/2013   Hypothyroidism 02/10/2013   Class 3 obesity 02/10/2013   DDD (degenerative disc disease), lumbar 12/16/2012   FH: colon cancer 11/26/2012   Chronic anemia 11/26/2012   Unspecified constipation 11/26/2012  Depression with anxiety 07/29/2012   Diabetic neuropathy ([place]) 06/12/2012   Leg swelling 06/12/2012   GERD 04/12/2009   Diabetes mellitus, type II ([place]) 01/13/2009   Hyperlipidemia 01/13/2009   Essential hypertension 01/13/2009   Osteoarthritis 01/13/2009    Goals Addressed   None    Recent Relevant Labs: Lab Results  Component Value Date/Time   HGBA1C 6.7 (H) 06/06/2020 09:27 AM   HGBA1C 6.2 (H) 02/02/2020 09:30 AM   HGBA1C 6.2 07/26/2016 12:00 AM   MICROALBUR 15.2 06/06/2020 09:28 AM   MICROALBUR  23.5 05/25/2019 03:48 PM    Kidney Function Lab Results  Component Value Date/Time   CREATININE 0.84 06/06/2020 09:27 AM   CREATININE 0.80 02/12/2020 10:06 AM   CREATININE 0.92 02/02/2020 09:30 AM   GFRNONAA >60 02/12/2020 10:06 AM   GFRAA >60 02/12/2020 10:06 AM    Current antihyperglycemic regimen:  Metformin 1081m twice daily  What recent interventions/DTPs have been made to improve glycemic control:  None.  Have there been any recent hospitalizations or ED visits since last visit with CPP? No.   Patient reports hypoglycemic symptoms, including None   Patient reports hyperglycemic symptoms, including blurry vision   How often are you checking your blood sugar? twice daily   What are your blood sugars ranging? Patient stated she was not at home but its never been over 150. Patient stated today 09/20/20 fasting it was 112, last night for dinner it was 08/30/20 was 124. Fasting:N/A Before meals: N/A After meals: N/A Bedtime: N/A  During the week, how often does your blood glucose drop below 70? Patient stated it has Never dropped below 70 in last week.  Are you checking your feet daily/regularly? Patient stated she checks her feet daily and she has no concerns at this time.     Adherence Review: Is the patient currently on a STATIN medication? Yes, Atorvastatin 40 mg.  Is the patient currently on ACE/ARB medication? No.  Does the patient have >5 day gap between last estimated fill dates? No   Follow-Up:  Pharmacist Review   VCharlann Lange RMA Clinical Pharmacist Assistant [phone] 4 minutes spent in review, coordination, and documentation.  Reviewed by: CBeverly Milch PharmD Clinical Pharmacist [facility] [phone]

## 2020-09-06 DIAGNOSIS — H40022 Open angle with borderline findings, high risk, left eye: Secondary | ICD-10-CM | POA: Diagnosis not present

## 2020-09-12 DIAGNOSIS — E890 Postprocedural hypothyroidism: Secondary | ICD-10-CM | POA: Diagnosis not present

## 2020-11-04 NOTE — Progress Notes (Incomplete)
Chronic Care Management Pharmacy Note  11/04/2020 Name:  Autumn Carson MRN:  [id number] DOB:  07-17-1956  Subjective: Autumn Carson is an 65 y.o. year old female who is a primary patient of [place], Modena Nunnery, MD.  The CCM team was consulted for assistance with disease management and care coordination needs.    Engaged with patient by telephone for follow up visit in response to provider referral for pharmacy case management and/or care coordination services.   Consent to Services:  {CCMCONSENTOPTIONS:25074}  Patient Care Team: [place], Modena Nunnery, MD as PCP - General (Family Medicine) Danie Binder, MD (Inactive) as Attending Physician (Gastroenterology) Edythe Clarity, [facility] as Pharmacist (Pharmacist)  Recent office visits: None since last CCM visit  Recent consult visits: None since last CCM visit  Hospital visits: None in previous 6 months  Objective:  Lab Results  Component Value Date   CREATININE 0.84 06/06/2020   BUN 11 06/06/2020   GFRNONAA >60 02/12/2020   GFRAA >60 02/12/2020   NA 139 06/06/2020   K 4.8 06/06/2020   CALCIUM 9.3 06/06/2020   CO2 26 06/06/2020   GLUCOSE 135 (H) 06/06/2020    Lab Results  Component Value Date/Time   HGBA1C 6.7 (H) 06/06/2020 09:27 AM   HGBA1C 6.2 (H) 02/02/2020 09:30 AM   HGBA1C 6.2 07/26/2016 12:00 AM   MICROALBUR 15.2 06/06/2020 09:28 AM   MICROALBUR 23.5 05/25/2019 03:48 PM    Last diabetic Eye exam:  Lab Results  Component Value Date/Time   HMDIABEYEEXA No Retinopathy 02/14/2018 12:00 AM    Last diabetic Foot exam: No results found for: HMDIABFOOTEX   Lab Results  Component Value Date   CHOL 225 (H) 06/06/2020   HDL 54 06/06/2020   LDLCALC 140 (H) 06/06/2020   TRIG 168 (H) 06/06/2020   CHOLHDL 4.2 06/06/2020    Hepatic Function Latest Ref Rng & Units 06/06/2020 02/02/2020 09/29/2019  Total Protein 6.1 - 8.1 g/dL 6.7 6.3 6.5  Albumin 3.6 - 5.1 g/dL - - -  AST 10 - 35 U/L 17 13 14   ALT 6  - 29 U/L 16 11 13   Alk Phosphatase 33 - 130 U/L - - -  Total Bilirubin 0.2 - 1.2 mg/dL 0.3 0.4 0.4  Bilirubin, Direct 0.1 - 0.5 mg/dL - - -    Lab Results  Component Value Date/Time   TSH 0.18 (L) 06/06/2020 09:27 AM   TSH 0.304 (L) 04/15/2016 05:19 PM   TSH <0.008 uIU/mL (L) 10/18/2010 02:09 PM   FREET4 1.3 06/06/2020 09:27 AM   FREET4 1.71 10/18/2010 03:46 PM    CBC Latest Ref Rng & Units 06/06/2020 02/10/2020 02/02/2020  WBC 3.8 - 10.8 Thousand/uL 5.1 5.4 4.9  Hemoglobin 11.7 - 15.5 g/dL 10.9(L) 11.0(L) 10.3(L)  Hematocrit 35.0 - 45.0 % 35.0 36.2 32.9(L)  Platelets 140 - 400 Thousand/uL 263 267 239    Lab Results  Component Value Date/Time   VD25OH 23 (L) 07/25/2010 08:54 PM   VD25OH 17 (L) 11/14/2009 09:52 PM    Clinical ASCVD: {YES/NO:21197} The 10-year ASCVD risk score Mikey Bussing DC Jr., et al., 2013) is: 23.3%   Values used to calculate the score:     Age: 57 years     Sex: Female     Is Non-Hispanic African American: Yes     Diabetic: Yes     Tobacco smoker: No     Systolic Blood Pressure: 327 mmHg     Is BP treated: Yes  HDL Cholesterol: 54 mg/dL     Total Cholesterol: 225 mg/dL    Depression screen [facility] 2/9 06/06/2020 02/02/2020 09/29/2019  Decreased Interest 2 2 0  Down, Depressed, Hopeless 2 2 0  PHQ - 2 Score 4 4 0  Altered sleeping 2 1 0  Tired, decreased energy 2 1 0  Change in appetite 2 0 0  Feeling bad or failure about yourself  2 0 0  Trouble concentrating 2 1 0  Moving slowly or fidgety/restless 2 1 0  Suicidal thoughts 0 0 0  PHQ-9 Score 16 8 0  Difficult doing work/chores Very difficult Somewhat difficult Not difficult at all  Some recent data might be hidden     ***Other: (CHADS2VASc if Afib, MMRC or CAT for COPD, ACT, DEXA)  Social History   Tobacco Use  Smoking Status Never Smoker  Smokeless Tobacco Never Used   BP Readings from Last 3 Encounters:  06/06/20 138/74  02/13/20 (!) 125/50  02/10/20 (!) 143/71   Pulse Readings from Last 3  Encounters:  06/06/20 80  02/13/20 63  02/10/20 63   Wt Readings from Last 3 Encounters:  06/06/20 (!) 327 lb (148.3 kg)  02/12/20 (!) 314 lb (142.4 kg)  02/10/20 (!) 314 lb 9.6 oz (142.7 kg)   BMI Readings from Last 3 Encounters:  06/06/20 51.22 kg/m  02/12/20 50.68 kg/m  02/10/20 49.27 kg/m    Assessment/Interventions: Review of patient past medical history, allergies, medications, health status, including review of consultants reports, laboratory and other test data, was performed as part of comprehensive evaluation and provision of chronic care management services.   SDOH:  (Social Determinants of Health) assessments and interventions performed: {yes/no:20286}  SDOH Screenings   Alcohol Screen: Low Risk   . Last Alcohol Screening Score (AUDIT): 1  Depression (PHQ2-9): Medium Risk  . PHQ-2 Score: 16  Financial Resource Strain: Low Risk   . Difficulty of Paying Living Expenses: Not very hard  Food Insecurity: Not on file  Housing: Not on file  Physical Activity: Not on file  Social Connections: Not on file  Stress: Not on file  Tobacco Use: Low Risk   . Smoking Tobacco Use: Never Smoker  . Smokeless Tobacco Use: Never Used  Transportation Needs: Not on file    CCM Care Plan  Allergies  Allergen Reactions  . Lisinopril Anaphylaxis    angioedema    Medications Reviewed Today    Reviewed by Edythe Clarity, [facility] (Pharmacist) on 07/11/20 at 1515  Med List Status: <None>  Medication Order Taking? Sig Documenting Provider Last Dose Status Informant  amLODipine (NORVASC) 10 MG tablet [id number] No TAKE 1 TABLET EVERY DAY Susy Frizzle, MD Taking Active   aspirin 81 MG tablet [id number] No Take 81 mg by mouth every other day. [provider] Taking Active Self  atorvastatin (LIPITOR) 40 MG tablet [id number]  Take 1 tablet (40 mg total) by mouth daily. Alycia Rossetti, MD  Active   Blood Glucose Monitoring Suppl (TRUE METRIX AIR GLUCOSE METER) w/Device  KIT [id number] No USE AS DIRECTED Buelah Manis, Modena Nunnery, MD Taking Active Self  celecoxib (CELEBREX) 100 MG capsule [id number]  Take 1 capsule (100 mg total) by mouth 2 (two) times daily. Buelah Manis, Modena Nunnery, MD  Active   diclofenac sodium (VOLTAREN) 1 % GEL [id number] No Apply dime size to knees three times a day as needed  Patient taking differently: Apply 1 application topically 3 (three) times daily as needed (pain). Apply dime size to  knees three times a day as needed   [place], Modena Nunnery, MD Taking Active Self  ferrous sulfate (FE TABS) 325 (65 FE) MG EC tablet [id number] No Take 1 tablet (325 mg total) by mouth 2 (two) times daily with a meal. Alycia Rossetti, MD Taking Active   FLUoxetine (PROZAC) 40 MG capsule [id number] No TAKE 1 CAPSULE EVERY DAY BEFORE BREAKFAST [place], Modena Nunnery, MD Taking Active   gabapentin (NEURONTIN) 300 MG capsule [id number]  Take 1 capsule (300 mg total) by mouth 3 (three) times daily. [place], Modena Nunnery, MD  Active   hydrALAZINE (APRESOLINE) 10 MG tablet [id number] No Take 1 tablet (10 mg total) by mouth 3 (three) times daily. Alycia Rossetti, MD Taking Active Self  HYDROcodone-acetaminophen [facility]) 5-325 MG tablet [id number]  Take 1 tablet by mouth every 6 (six) hours as needed for moderate pain. Chronic Pain. Dx: G66.4 Alycia Rossetti, MD  Active   levothyroxine (SYNTHROID) 175 MCG tablet [id number] No Take 175 mcg by mouth daily before breakfast. [provider] Taking Active Self  levothyroxine (SYNTHROID) 200 MCG tablet [id number] No Take 200 mcg by mouth daily before breakfast. [provider] Taking Active   metFORMIN (GLUCOPHAGE) 1000 MG tablet [id number] No TAKE 1 TABLET TWICE DAILY IN THE MORNING  AND IN THE [place], Modena Nunnery, MD Taking Active   nebivolol (BYSTOLIC) 10 MG tablet [id number] No TAKE 1 TABLET EVERY DAY  Patient taking differently: Take 10 mg by mouth daily. TAKE 1 TABLET EVERY DAY   [place], Modena Nunnery, MD Taking Active Self   omeprazole (PRILOSEC) 40 MG capsule [id number] No TAKE 1 CAPSULE TWICE DAILY  30  MINUTES  PRIOR  TO  MEALS  Patient taking differently: Take 40 mg by mouth daily.    Alycia Rossetti, MD Taking Active Self  TRUE METRIX BLOOD GLUCOSE TEST test strip [id number] No CHECK FINGER STICK BLOOD SUGAR TWICE DAILY AS DIRECTED [place], Modena Nunnery, MD Taking Active   zolpidem (AMBIEN) 5 MG tablet [id number] No TAKE 1 TABLET EVERY NIGHT AT BEDTIME AS NEEDED FOR SLEEP  Patient taking differently: Take 5 mg by mouth at bedtime as needed for sleep. TAKE 1 TABLET EVERY NIGHT AT BEDTIME AS NEEDED FOR SLEEP   [place], Modena Nunnery, MD Taking Active Self          Patient Active Problem List   Diagnosis Date Noted  . Lumbar foraminal stenosis 02/12/2020  . Chronic insomnia 07/15/2018  . Dysphagia, idiopathic 10/27/2015  . OA (osteoarthritis) of knee 06/23/2014  . Knee pain, right 09/16/2013  . Spinal stenosis, lumbar region, with neurogenic claudication 04/07/2013  . Hypothyroidism 02/10/2013  . Class 3 obesity 02/10/2013  . DDD (degenerative disc disease), lumbar 12/16/2012  . FH: colon cancer 11/26/2012  . Chronic anemia 11/26/2012  . Unspecified constipation 11/26/2012  . Depression with anxiety 07/29/2012  . Diabetic neuropathy ([place]) 06/12/2012  . Leg swelling 06/12/2012  . GERD 04/12/2009  . Diabetes mellitus, type II ([place]) 01/13/2009  . Hyperlipidemia 01/13/2009  . Essential hypertension 01/13/2009  . Osteoarthritis 01/13/2009    Immunization History  Administered Date(s) Administered  . Influenza Split 03/30/2014  . Influenza,inj,Quad PF,6+ Mos 04/17/2016, 05/30/2017, 04/30/2019, 04/09/2020  . Influenza,trivalent, recombinat, inj, PF 05/08/2015  . Influenza-Unspecified 05/10/2018  . Moderna Sars-Covid-2 Vaccination 09/29/2019, 10/27/2019, 05/31/2020  . Pneumococcal Polysaccharide-23 05/13/2013    Conditions to be addressed/monitored:  HTN, GERD, DM, hypothyroidism, hyperlipidemia,  depression/anxiety, osteoarthritis of knee  There are no care plans that you recently  modified to display for this patient.    Medication Assistance: {MEDASSISTANCEINFO:25044}  Patient's preferred pharmacy is:  [facility] [place], [address] [address] Phone: [phone] Fax: [phone]  [facility] - [address] [place] [address] Phone: [phone] Fax: [phone]  Uses pill box? {Yes or If no, why not?:20788} Pt endorses ***% compliance  We discussed: {Pharmacy options:24294} Patient decided to: {US Pharmacy Plan:23885}  Care Plan and Follow Up Patient Decision:  {FOLLOWUP:24991}  Plan: {CM FOLLOW UP PLAN:25073}  *** Current Barriers:  . {pharmacybarriers:24917} . ***  Pharmacist Clinical Goal(s):  Marland Kitchen Patient will {PHARMACYGOALCHOICES:24921} through collaboration with PharmD and provider.  . ***  Interventions: . 1:1 collaboration with Buelah Manis, Modena Nunnery, MD regarding development and update of comprehensive plan of care as evidenced by provider attestation and co-signature . Inter-disciplinary care team collaboration (see longitudinal plan of care) . Comprehensive medication review performed; medication list updated in electronic medical record  Hypertension (BP goal {CHL HP UPSTREAM Pharmacist BP [phone]}) -{US controlled/uncontrolled:25276} -Current treatment:  Bystolic 16OH  hydralazine 23m three times daily  amlodipine 10 mg daily -Medications previously tried: ***  -Current home readings: *** -Current dietary habits: *** -Current exercise habits: *** -{ACTIONS;DENIES/REPORTS:21021675::"Denies"} hypotensive/hypertensive symptoms -Educated on {CCM BP Counseling:25124} -Counseled to monitor BP at home ***, document, and provide log at future  appointments -{CCMPHARMDINTERVENTION:25122}  Hyperlipidemia: (LDL goal < ***) -{US controlled/uncontrolled:25276} -Current treatment:  Atorvastatin 428m-Medications previously tried: ***  -Current dietary patterns: *** -Current exercise habits: *** -Educated on {CCM HLD Counseling:25126} -{CCMPHARMDINTERVENTION:25122}  Diabetes (A1c goal {A1c goals:23924}) -{US controlled/uncontrolled:25276} -Current medications:   metformin 1000 twice daily -Medications previously tried: ***  -Current home glucose readings . fasting glucose: *** . post prandial glucose: *** -{ACTIONS;DENIES/REPORTS:21021675::"Denies"} hypoglycemic/hyperglycemic symptoms -Current meal patterns:  . breakfast: ***  . lunch: ***  . dinner: *** . snacks: *** . drinks: *** -Current exercise: *** -Educated on {CCM DM COUNSELING:25123} -Counseled to check feet daily and get yearly eye exams -{CCMPHARMDINTERVENTION:25122}  Depression/Anxiety (Goal: ***) -{US controlled/uncontrolled:25276} -Current treatment: . Fluoxetine 4011maily -Medications previously tried/failed: *** -PHQ9: *** -GAD7: *** -Connected with *** for mental health support -Educated on {CCM mental health counseling:25127} -{CCMPHARMDINTERVENTION:25122}  Osteoarthritis (Goal: ***) -{US controlled/uncontrolled:25276} -Current treatment  . *** -Medications previously tried: ***  -{CCMPHARMDINTERVENTION:25122}  Hypothyroidism (Goal: ***) -{US controlled/uncontrolled:25276} -Current treatment  . Levothyroxine 175 mcg -Medications previously tried: ***  -{CCMPHARMDINTERVENTION:25122}   Patient Goals/Self-Care Activities . Patient will:  - {pharmacypatientgoals:24919}  Follow Up Plan: {CM FOLLOW UP PLAFGBM:21115}

## 2020-11-09 ENCOUNTER — Telehealth: Payer: Self-pay

## 2020-11-10 ENCOUNTER — Telehealth: Payer: Self-pay

## 2020-11-15 DIAGNOSIS — E890 Postprocedural hypothyroidism: Secondary | ICD-10-CM | POA: Diagnosis not present

## 2020-11-15 DIAGNOSIS — Z85850 Personal history of malignant neoplasm of thyroid: Secondary | ICD-10-CM | POA: Diagnosis not present

## 2020-11-17 DIAGNOSIS — E890 Postprocedural hypothyroidism: Secondary | ICD-10-CM | POA: Diagnosis not present

## 2020-11-17 DIAGNOSIS — Z85850 Personal history of malignant neoplasm of thyroid: Secondary | ICD-10-CM | POA: Diagnosis not present

## 2020-11-24 ENCOUNTER — Telehealth: Payer: Self-pay | Admitting: Pharmacist

## 2020-11-24 NOTE — Progress Notes (Addendum)
    Chronic Care Management Pharmacy Assistant   Name: Autumn Carson  MRN: [id number] DOB: [birth year]  Reason for Encounter: Adherence Review  Medications: Outpatient Encounter Medications as of 11/24/2020  Medication Sig   amLODipine (NORVASC) 10 MG tablet TAKE 1 TABLET EVERY DAY   aspirin 81 MG tablet Take 81 mg by mouth every other day.   atorvastatin (LIPITOR) 40 MG tablet Take 1 tablet (40 mg total) by mouth daily.   Blood Glucose Monitoring Suppl (TRUE METRIX AIR GLUCOSE METER) w/Device KIT USE AS DIRECTED   celecoxib (CELEBREX) 100 MG capsule Take 1 capsule (100 mg total) by mouth 2 (two) times daily.   diclofenac sodium (VOLTAREN) 1 % GEL Apply dime size to knees three times a day as needed (Patient taking differently: Apply 1 application topically 3 (three) times daily as needed (pain). Apply dime size to knees three times a day as needed)   ferrous sulfate (FE TABS) 325 (65 FE) MG EC tablet Take 1 tablet (325 mg total) by mouth 2 (two) times daily with a meal.   FLUoxetine (PROZAC) 40 MG capsule TAKE 1 CAPSULE EVERY DAY BEFORE BREAKFAST   gabapentin (NEURONTIN) 300 MG capsule TAKE 1 CAPSULE THREE TIMES DAILY  (DOSE  CHANGE)   hydrALAZINE (APRESOLINE) 10 MG tablet Take 1 tablet (10 mg total) by mouth 3 (three) times daily.   HYDROcodone-acetaminophen (NORCO) 5-325 MG tablet Take 1 tablet by mouth every 6 (six) hours as needed for moderate pain. Chronic Pain. Dx: G89.4   levothyroxine (SYNTHROID) 175 MCG tablet Take 175 mcg by mouth daily before breakfast.   levothyroxine (SYNTHROID) 200 MCG tablet Take 200 mcg by mouth daily before breakfast.   metFORMIN (GLUCOPHAGE) 1000 MG tablet TAKE 1 TABLET TWICE DAILY IN THE MORNING  AND IN THE EVENING   nebivolol (BYSTOLIC) 10 MG tablet TAKE 1 TABLET EVERY DAY (Patient taking differently: Take 10 mg by mouth daily. TAKE 1 TABLET EVERY DAY)   omeprazole (PRILOSEC) 40 MG capsule TAKE 1 CAPSULE TWICE DAILY  30  MINUTES  PRIOR  TO  MEALS    TRUE METRIX BLOOD GLUCOSE TEST test strip CHECK FINGER STICK BLOOD SUGAR TWICE DAILY AS DIRECTED   zolpidem (AMBIEN) 5 MG tablet TAKE 1 TABLET EVERY NIGHT AT BEDTIME AS NEEDED FOR SLEEP (Patient taking differently: Take 5 mg by mouth at bedtime as needed for sleep. TAKE 1 TABLET EVERY NIGHT AT BEDTIME AS NEEDED FOR SLEEP)   No facility-administered encounter medications on file as of 11/24/2020.   Reviewed the patients chart for any medical/health and/or medication changes there were not any changes at this time.  Follow-Up:Pharmacist Review   Charlann Lange, [place] Pharmacist Assistant [phone]

## 2020-12-02 ENCOUNTER — Other Ambulatory Visit: Payer: Self-pay | Admitting: Family Medicine

## 2020-12-06 ENCOUNTER — Other Ambulatory Visit: Payer: Self-pay

## 2020-12-06 ENCOUNTER — Ambulatory Visit: Payer: Medicare HMO | Attending: Nurse Practitioner | Admitting: Nurse Practitioner

## 2020-12-06 DIAGNOSIS — I10 Essential (primary) hypertension: Secondary | ICD-10-CM

## 2020-12-06 DIAGNOSIS — E1142 Type 2 diabetes mellitus with diabetic polyneuropathy: Secondary | ICD-10-CM | POA: Diagnosis not present

## 2020-12-06 DIAGNOSIS — Z1231 Encounter for screening mammogram for malignant neoplasm of breast: Secondary | ICD-10-CM

## 2020-12-06 DIAGNOSIS — E890 Postprocedural hypothyroidism: Secondary | ICD-10-CM | POA: Diagnosis not present

## 2020-12-06 DIAGNOSIS — F5104 Psychophysiologic insomnia: Secondary | ICD-10-CM | POA: Diagnosis not present

## 2020-12-06 MED ORDER — ZOLPIDEM TARTRATE 5 MG PO TABS: ORAL_TABLET | ORAL | 3 refills | Status: DC

## 2020-12-06 NOTE — Progress Notes (Signed)
Virtual Visit via Telephone Note Due to national recommendations of social distancing due to [place] 19, telehealth visit is felt to be most appropriate for this patient at this time.  I discussed the limitations, risks, security and privacy concerns of performing an evaluation and management service by telephone and the availability of in person appointments. I also discussed with the patient that there may be a patient responsible charge related to this service. The patient expressed understanding and agreed to proceed.    I connected with Autumn Carson on 12/06/20  at   1:50 PM EDT  EDT by telephone and verified that I am speaking with the correct person using two identifiers.   Consent I discussed the limitations, risks, security and privacy concerns of performing an evaluation and management service by telephone and the availability of in person appointments. I also discussed with the patient that there may be a patient responsible charge related to this service. The patient expressed understanding and agreed to proceed.   Location of Patient: Private  Residence    Location of Provider: [facility] [employer] [facility]    Persons participating in Telemedicine visit: Autumn Rankins FNP-BC Autumn Carson    History of Present Illness: Telemedicine visit for: Establish Care She has a past medical history of Anxiety, Arthritis of both knees, Back pain, THYROID Cancer, Chronic anemia, Depression (1998), Essential hypertension, GERD, History of thyroid cancer (2011), Hypothyroidism, Lumbar spinal stenosis, Obesity, PONV, Seasonal allergies, and Type 2 diabetes mellitus with diabetic neuropathy.   Patient has been counseled on age-appropriate routine health concerns for screening and prevention. These are reviewed and up-to-date. Referrals have been placed accordingly. Immunizations are up-to-date or declined.    Mammogram: Overdue. Referred for  mammogram PAP Smear: Overdue 2018. Will need to schedule Colonoscopy: overdue She is no longer seeing Dr. Buelah Carson as she is no longer practicing in the office where Autumn Carson was previously a patient.    Essential Hypertension Needs refills on amlodipine 10 mg daily, chlorthalidone 25 mg daily, hydralazine 10 mg TID and bystolic 10 mg daily.  Denies chest pain, shortness of breath, palpitations, lightheadedness, dizziness, headaches or BLE edema.  BP Readings from Last 3 Encounters:  06/06/20 138/74  02/13/20 (!) 125/50  02/10/20 (!) 143/71    DM Well controlled. Taking metformin 1000 mg BID as prescribed. She takes gabapentin 300 mg TID for peripheral neuropathy. She denies any hypo or hyperglycemic symptoms. LDL not at goal. She was switched to atorvastatin 40mg   from simvastatin 40 mg daily by previous PCP.  Lab Results  Component Value Date   HGBA1C 6.7 (H) 06/06/2020   Lab Results  Component Value Date   LDLCALC 140 (H) 06/06/2020   Hypothyroidism She sees Dr. Buddy Carson. She is currently taking synthroid 175mg  daily as prescribed. History of papillary adenocarcinoma of the thyroid.  Lab Results  Component Value Date   TSH 0.18 (L) 06/06/2020    Neck Pain She endorses pain behind her left ear radiating into her neck, left shoulder and down to Left elbow. Pain is described as sharp pain which lasts for several minutes and goes away on its own. Occurring daily with onset 4-5 weeks ago.    Past Medical History:  Diagnosis Date  . Anxiety   . Arthritis of both knees   . Back pain   . Cancer ([place])    Thyroid  . Chronic anemia   . Depression 1998  . Essential hypertension   . GERD (  gastroesophageal reflux disease)   . History of thyroid cancer 2011   Papillary thyroid cancer s/p radiation  . Hypothyroidism   . Lumbar spinal stenosis   . Obesity   . PONV (postoperative nausea and vomiting)   . Seasonal allergies   . Type 2 diabetes mellitus with diabetic neuropathy [facility])      Past Surgical History:  Procedure Laterality Date  . BACK SURGERY    . BIOPSY N/A 01/16/2013   Procedure: BIOPSY;  Surgeon: Autumn Binder, MD;  [facility];  Service: Endoscopy;  Laterality: N/A;  SMALL BOWEL ULCERS  . BRAVO [place] STUDY N/A 11/22/2015   Procedure: BRAVO [place];  Surgeon: Autumn Binder, MD;  [facility];  Service: Endoscopy;  Laterality: N/A;  . CARPAL TUNNEL RELEASE     4 total, bilaterally  . CESAREAN SECTION    . CHOLECYSTECTOMY  1990's  . COLONOSCOPY WITH ESOPHAGOGASTRODUODENOSCOPY (EGD) N/A 12/08/2012   SLF: 1. Normal mucosa in the terminal ileum 2. mild diverticulosis throughout the entire examined colon. 3. rectal bleeding due to moderate sized internal hemorrhoids.   . ELBOW SURGERY Bilateral   . ENTEROSCOPY N/A 01/16/2013   Procedure: ENTEROSCOPY;  Surgeon: Autumn Binder, MD;  [facility];  Service: Endoscopy;  Laterality: N/A;  2:00  . ESOPHAGOGASTRODUODENOSCOPY N/A 11/22/2015   Procedure: ESOPHAGOGASTRODUODENOSCOPY (EGD);  Surgeon: Autumn Binder, MD;  [facility];  Service: Endoscopy;  Laterality: N/A;  130 - moved to 2:00 - office to notify pt  . GIVENS CAPSULE STUDY N/A 12/31/2012   Procedure: GIVENS CAPSULE STUDY;  Surgeon: Autumn Binder, MD;  [facility];  Service: Endoscopy;  Laterality: N/A;  7:30  . ROTATOR CUFF REPAIR Bilateral    bilaterally  . SAVORY DILATION N/A 11/22/2015   Procedure: SAVORY DILATION;  Surgeon: Autumn Binder, MD;  [facility];  Service: Endoscopy;  Laterality: N/A;  . SPINE SURGERY     Lumbar Fusion- Dr. Trenton Carson  . TONSILECTOMY, ADENOIDECTOMY, BILATERAL MYRINGOTOMY AND TUBES  1987  . TONSILLECTOMY  1987  . TOTAL THYROIDECTOMY  2011  . TUBAL LIGATION      Family History  Problem Relation Age of Onset  . Heart disease Mother   . Lung cancer Father        Age 44  . Colon cancer Brother        Age 27  . Heart attack Daughter 76  . Hypertension Daughter    . Other Brother        [place] orange, age 65, cancer  . Breast cancer Cousin     Social History   Socioeconomic History  . Marital status: Divorced    Spouse name: Not on file  . Number of children: 2  . Years of education: Not on file  . Highest education level: Not on file  Occupational History  . Occupation: Disabled    Employer: UNEMPLOYED  Tobacco Use  . Smoking status: Never Smoker  . Smokeless tobacco: Never Used  Vaping Use  . Vaping Use: Never used  Substance and Sexual Activity  . Alcohol use: Yes    Alcohol/week: 0.0 standard drinks    Comment: Occassional  . Drug use: No  . Sexual activity: Not Currently    Birth control/protection: Surgical  Other Topics Concern  . Not on file  Social History Narrative  . Not on file   Social Determinants of Health   Financial Resource Strain:  Low Risk   . Difficulty of Paying Living Expenses: Not very hard  Food Insecurity: Not on file  Transportation Needs: Not on file  Physical Activity: Not on file  Stress: Not on file  Social Connections: Not on file     Observations/Objective: Awake, alert and oriented x 3   Review of Systems  Constitutional: Negative for fever, malaise/fatigue and weight loss.  HENT: Negative.  Negative for nosebleeds.   Eyes: Negative.  Negative for blurred vision, double vision and photophobia.  Respiratory: Negative.  Negative for cough and shortness of breath.   Cardiovascular: Negative.  Negative for chest pain, palpitations and leg swelling.  Gastrointestinal: Negative.  Negative for heartburn, nausea and vomiting.  Musculoskeletal: Positive for joint pain, myalgias and neck pain.  Neurological: Negative.  Negative for dizziness, focal weakness, seizures and headaches.  Psychiatric/Behavioral: Negative for suicidal ideas. The patient has insomnia.     Assessment and Plan: Crucita was seen today for establish care.  Diagnoses and all orders for this visit:  Type 2 diabetes  mellitus with diabetic polyneuropathy, without long-term current use of insulin ([place]) -     Ambulatory referral to Podiatry Continue blood sugar control as discussed in office today, low carbohydrate diet, and regular physical exercise as tolerated, 150 minutes per week (30 min each day, 5 days per week, or 50 min 3 days per week). Keep blood sugar logs with fasting goal of 90-130 mg/dl, post prandial (after you eat) less than 180.  For Hypoglycemia: BS <60 and Hyperglycemia BS >400; contact the clinic ASAP. Annual eye exams and foot exams are recommended.   Postoperative hypothyroidism Continue levothyroxine as prescribed. Follow up with Endocrinology as instructed  Primary hypertension Continue all antihypertensives as prescribed.  Remember to bring in your blood pressure log with you for your follow up appointment.  DASH/Mediterranean Diets are healthier choices for HTN.    Breast cancer screening by mammogram -     MM 3D SCREEN BREAST BILATERAL; Future  Chronic insomnia -     zolpidem (AMBIEN) 5 MG tablet; TAKE 1 TABLET EVERY NIGHT AT BEDTIME AS NEEDED FOR SLEEP     Follow Up Instructions No follow-ups on file.     I discussed the assessment and treatment plan with the patient. The patient was provided an opportunity to ask questions and all were answered. The patient agreed with the plan and demonstrated an understanding of the instructions.   The patient was advised to call back or seek an in-person evaluation if the symptoms worsen or if the condition fails to improve as anticipated.  I provided 19 minutes of non-face-to-face time during this encounter including median intraservice time, reviewing previous notes, labs, imaging, medications and explaining diagnosis and management.  Gildardo Pounds, FNP-BC

## 2020-12-13 ENCOUNTER — Other Ambulatory Visit: Payer: Self-pay

## 2020-12-13 ENCOUNTER — Encounter: Payer: Self-pay | Admitting: Podiatry

## 2020-12-13 ENCOUNTER — Ambulatory Visit: Payer: Medicare HMO | Admitting: Podiatry

## 2020-12-13 DIAGNOSIS — M79675 Pain in left toe(s): Secondary | ICD-10-CM | POA: Diagnosis not present

## 2020-12-13 DIAGNOSIS — M79609 Pain in unspecified limb: Secondary | ICD-10-CM

## 2020-12-13 DIAGNOSIS — B351 Tinea unguium: Secondary | ICD-10-CM

## 2020-12-13 DIAGNOSIS — M79674 Pain in right toe(s): Secondary | ICD-10-CM

## 2020-12-13 DIAGNOSIS — E119 Type 2 diabetes mellitus without complications: Secondary | ICD-10-CM | POA: Diagnosis not present

## 2020-12-13 DIAGNOSIS — E890 Postprocedural hypothyroidism: Secondary | ICD-10-CM | POA: Insufficient documentation

## 2020-12-13 DIAGNOSIS — E1143 Type 2 diabetes mellitus with diabetic autonomic (poly)neuropathy: Secondary | ICD-10-CM

## 2020-12-13 DIAGNOSIS — Z85850 Personal history of malignant neoplasm of thyroid: Secondary | ICD-10-CM | POA: Insufficient documentation

## 2020-12-13 MED ORDER — CICLOPIROX 8 % EX SOLN: CUTANEOUS | 11 refills | Status: DC

## 2020-12-13 NOTE — Patient Instructions (Signed)
Onychomycosis/Fungal Toenails  WHAT IS IT? An infection that lies within the keratin of your nail plate that is caused by a fungus.  WHY ME? Fungal infections affect all ages, sexes, races, and creeds.  There may be many factors that predispose you to a fungal infection such as age, coexisting medical conditions such as diabetes, or an autoimmune disease; stress, medications, fatigue, genetics, etc.  Bottom line: fungus thrives in a warm, moist environment and your shoes offer such a location.  IS IT CONTAGIOUS? Theoretically, yes.  You do not want to share shoes, nail clippers or files with someone who has fungal toenails.  Walking around barefoot in the same room or sleeping in the same bed is unlikely to transfer the organism.  It is important to realize, however, that fungus can spread easily from one nail to the next on the same foot.  HOW DO WE TREAT THIS?  There are several ways to treat this condition.  Treatment may depend on many factors such as age, medications, pregnancy, liver and kidney conditions, etc.  It is best to ask your doctor which options are available to you.  1. No treatment.   Unlike many other medical concerns, you can live with this condition.  However for many people this can be a painful condition and may lead to ingrown toenails or a bacterial infection.  It is recommended that you keep the nails cut short to help reduce the amount of fungal nail. 2. Topical treatment.  These range from herbal remedies to prescription strength nail lacquers.  About 40-50% effective, topicals require twice daily application for approximately 9 to 12 months or until an entirely new nail has grown out.  The most effective topicals are medical grade medications available through physicians offices. 3. Oral antifungal medications.  With an 80-90% cure rate, the most common oral medication requires 3 to 4 months of therapy and stays in your system for a year as the new nail grows out.  Oral  antifungal medications do require blood work to make sure it is a safe drug for you.  A liver function panel will be performed prior to starting the medication and after the first month of treatment.  It is important to have the blood work performed to avoid any harmful side effects.  In general, this medication safe but blood work is required. 4. Laser Therapy.  This treatment is performed by applying a specialized laser to the affected nail plate.  This therapy is noninvasive, fast, and non-painful.  It is not covered by insurance and is therefore, out of pocket.  The results have been very good with a 80-95% cure rate.  [facility] is the only practice in the area to offer this therapy. 5. Permanent Nail Avulsion.  Removing the entire nail so that a new nail will not grow back.  Ciclopirox nail solution What is this medicine? CICLOPIROX (sye kloe PEER ox) NAIL SOLUTION is an antifungal medicine. It used to treat fungal infections of the nails. This medicine may be used for other purposes; ask your health care provider or pharmacist if you have questions. COMMON BRAND NAME(S): [id number], Penlac What should I tell my health care provider before I take this medicine? They need to know if you have any of these conditions:  diabetes mellitus  history of seizures  HIV infection  immune system problems or organ transplant  large areas of burned or damaged skin  peripheral vascular disease or poor circulation  taking  corticosteroid medication (including steroid inhalers, cream, or lotion)  an unusual or allergic reaction to ciclopirox, isopropyl alcohol, other medicines, foods, dyes, or preservatives  pregnant or trying to get pregnant  breast-feeding How should I use this medicine? This medicine is for external use only. Follow the directions that come with this medicine exactly. Wash and dry your hands before use. Avoid contact with the eyes, mouth or nose. If you do get this  medicine in your eyes, rinse out with plenty of cool tap water. Contact your doctor or health care professional if eye irritation occurs. Use at regular intervals. Do not use your medicine more often than directed. Finish the full course prescribed by your doctor or health care professional even if you think you are better. Do not stop using except on your doctor's advice. Talk to your pediatrician regarding the use of this medicine in children. While this medicine may be prescribed for children as young as 12 years for selected conditions, precautions do apply. Overdosage: If you think you have taken too much of this medicine contact a poison control center or emergency room at once. NOTE: This medicine is only for you. Do not share this medicine with others. What if I miss a dose? If you miss a dose, use it as soon as you can. If it is almost time for your next dose, use only that dose. Do not use double or extra doses. What may interact with this medicine? Interactions are not expected. Do not use any other skin products without telling your doctor or health care professional. This list may not describe all possible interactions. Give your health care provider a list of all the medicines, herbs, non-prescription drugs, or dietary supplements you use. Also tell them if you smoke, drink alcohol, or use illegal drugs. Some items may interact with your medicine. What should I watch for while using this medicine? Tell your doctor or health care professional if your symptoms get worse. Four to six months of treatment may be needed for the nail(s) to improve. Some people may not achieve a complete cure or clearing of the nails by this time. Tell your doctor or health care professional if you develop sores or blisters that do not heal properly. If your nail infection returns after stopping using this product, contact your doctor or health care professional. What side effects may I notice from receiving this  medicine? Side effects that you should report to your doctor or health care professional as soon as possible:  allergic reactions like skin rash, itching or hives, swelling of the face, lips, or tongue  severe irritation, redness, burning, blistering, peeling, swelling, oozing Side effects that usually do not require medical attention (report to your doctor or health care professional if they continue or are bothersome):  mild reddening of the skin  nail discoloration  temporary burning or mild stinging at the site of application This list may not describe all possible side effects. Call your doctor for medical advice about side effects. You may report side effects to FDA at 1-800-FDA-1088. Where should I keep my medicine? Keep out of the reach of children. Store at room temperature between 15 and 30 degrees C (59 and 86 degrees F). Do not freeze. Protect from light by storing the bottle in the carton after every use. This medicine is flammable. Keep away from heat and flame. Throw away any unused medicine after the expiration date. NOTE: This sheet is a summary. It may not cover all possible  information. If you have questions about this medicine, talk to your doctor, pharmacist, or health care provider.  2021 Elsevier/Gold Standard (2007-10-20 16:49:20)   Diabetes Mellitus and Foot Care Foot care is an important part of your health, especially when you have diabetes. Diabetes may cause you to have problems because of poor blood flow (circulation) to your feet and legs, which can cause your skin to:  Become thinner and drier.  Break more easily.  Heal more slowly.  Peel and crack. You may also have nerve damage (neuropathy) in your legs and feet, causing decreased feeling in them. This means that you may not notice minor injuries to your feet that could lead to more serious problems. Noticing and addressing any potential problems early is the best way to prevent future foot  problems. How to care for your feet Foot hygiene  Wash your feet daily with warm water and mild soap. Do not use hot water. Then, pat your feet and the areas between your toes until they are completely dry. Do not soak your feet as this can dry your skin.  Trim your toenails straight across. Do not dig under them or around the cuticle. File the edges of your nails with an emery board or nail file.  Apply a moisturizing lotion or petroleum jelly to the skin on your feet and to dry, brittle toenails. Use lotion that does not contain alcohol and is unscented. Do not apply lotion between your toes.   Shoes and socks  Wear clean socks or stockings every day. Make sure they are not too tight. Do not wear knee-high stockings since they may decrease blood flow to your legs.  Wear shoes that fit properly and have enough cushioning. Always look in your shoes before you put them on to be sure there are no objects inside.  To break in new shoes, wear them for just a few hours a day. This prevents injuries on your feet. Wounds, scrapes, corns, and calluses  Check your feet daily for blisters, cuts, bruises, sores, and redness. If you cannot see the bottom of your feet, use a mirror or ask someone for help.  Do not cut corns or calluses or try to remove them with medicine.  If you find a minor scrape, cut, or break in the skin on your feet, keep it and the skin around it clean and dry. You may clean these areas with mild soap and water. Do not clean the area with peroxide, alcohol, or iodine.  If you have a wound, scrape, corn, or callus on your foot, look at it several times a day to make sure it is healing and not infected. Check for: ? Redness, swelling, or pain. ? Fluid or blood. ? Warmth. ? Pus or a bad smell.   General tips  Do not cross your legs. This may decrease blood flow to your feet.  Do not use heating pads or hot water bottles on your feet. They may burn your skin. If you have lost  feeling in your feet or legs, you may not know this is happening until it is too late.  Protect your feet from hot and cold by wearing shoes, such as at the beach or on hot pavement.  Schedule a complete foot exam at least once a year (annually) or more often if you have foot problems. Report any cuts, sores, or bruises to your health care provider immediately. Where to find more information  American Diabetes Association: www.diabetes.org  Association  of Diabetes Care & Education Specialists: www.diabeteseducator.org Contact a health care provider if:  You have a medical condition that increases your risk of infection and you have any cuts, sores, or bruises on your feet.  You have an injury that is not healing.  You have redness on your legs or feet.  You feel burning or tingling in your legs or feet.  You have pain or cramps in your legs and feet.  Your legs or feet are numb.  Your feet always feel cold.  You have pain around any toenails. Get help right away if:  You have a wound, scrape, corn, or callus on your foot and: ? You have pain, swelling, or redness that gets worse. ? You have fluid or blood coming from the wound, scrape, corn, or callus. ? Your wound, scrape, corn, or callus feels warm to the touch. ? You have pus or a bad smell coming from the wound, scrape, corn, or callus. ? You have a fever. ? You have a red line going up your leg. Summary  Check your feet every day for blisters, cuts, bruises, sores, and redness.  Apply a moisturizing lotion or petroleum jelly to the skin on your feet and to dry, brittle toenails.  Wear shoes that fit properly and have enough cushioning.  If you have foot problems, report any cuts, sores, or bruises to your health care provider immediately.  Schedule a complete foot exam at least once a year (annually) or more often if you have foot problems. This information is not intended to replace advice given to you by your  health care provider. Make sure you discuss any questions you have with your health care provider. Document Revised: 02/04/2020 Document Reviewed: 02/04/2020 Elsevier Patient Education  Autumn Carson.

## 2020-12-13 NOTE — Progress Notes (Signed)
Subjective: Autumn Carson presents today referred by Gildardo Pounds, NP for diabetic foot evaluation.  Patient relates 34 year history of diabetes.  Patient denies any history of foot wounds.  Patient denies symptoms of numbness in feet.  Patient does have symptoms of tingling in feet with prolonged standing.  Patient does have symptoms of burning in feet with prolonged standing.  Patient has occasional symptoms of pins/needles sensations in feet.  Patient relates blood glucose was 101 mg/dl this morning.   PCP is Gildardo Pounds, NP , and last visit was 12/06/2020..  Today, patient c/o of painful, discolored, thick toenails which interfere with daily activities.  Pain is aggravated when wearing enclosed shoe gear. She relates no recent attempt at treatment.   Past Medical History:  Diagnosis Date  . Anxiety   . Arthritis of both knees   . Back pain   . Cancer ([place])    Thyroid  . Chronic anemia   . Depression 1998  . Essential hypertension   . GERD (gastroesophageal reflux disease)   . History of thyroid cancer 2011   Papillary thyroid cancer s/p radiation  . Hypothyroidism   . Lumbar spinal stenosis   . Obesity   . PONV (postoperative nausea and vomiting)   . Seasonal allergies   . Type 2 diabetes mellitus with diabetic neuropathy [facility])     Patient Active Problem List   Diagnosis Date Noted  . History of papillary adenocarcinoma of thyroid 12/13/2020  . Postoperative hypothyroidism 12/13/2020  . Severe obesity ([place]) 12/13/2020  . Arthrodesis status 03/15/2020  . Lumbar foraminal stenosis 02/12/2020  . Spondylisthesis 12/10/2019  . Status post lumbar spinal fusion 12/10/2019  . Chronic insomnia 07/15/2018  . Dysphagia, idiopathic 10/27/2015  . OA (osteoarthritis) of knee 06/23/2014  . Knee pain, right 09/16/2013  . Spinal stenosis, lumbar region, with neurogenic claudication 04/07/2013  . Hypothyroidism 02/10/2013  . Class 3 obesity 02/10/2013  .  DDD (degenerative disc disease), lumbar 12/16/2012  . FH: colon cancer 11/26/2012  . Chronic anemia 11/26/2012  . Unspecified constipation 11/26/2012  . Depression with anxiety 07/29/2012  . Diabetic neuropathy ([place]) 06/12/2012  . Leg swelling 06/12/2012  . GERD 04/12/2009  . Diabetes mellitus, type II ([place]) 01/13/2009  . Hyperlipidemia 01/13/2009  . Essential hypertension 01/13/2009  . Osteoarthritis 01/13/2009    Past Surgical History:  Procedure Laterality Date  . BACK SURGERY    . BIOPSY N/A 01/16/2013   Procedure: BIOPSY;  Surgeon: Danie Binder, MD;  [facility];  Service: Endoscopy;  Laterality: N/A;  SMALL BOWEL ULCERS  . BRAVO [place] STUDY N/A 11/22/2015   Procedure: BRAVO [place];  Surgeon: Danie Binder, MD;  Location[facility];  Service: Endoscopy;  Laterality: N/A;  . CARPAL TUNNEL RELEASE     4 total, bilaterally  . CESAREAN SECTION    . CHOLECYSTECTOMY  1990's  . COLONOSCOPY WITH ESOPHAGOGASTRODUODENOSCOPY (EGD) N/A 12/08/2012   SLF: 1. Normal mucosa in the terminal ileum 2. mild diverticulosis throughout the entire examined colon. 3. rectal bleeding due to moderate sized internal hemorrhoids.   . ELBOW SURGERY Bilateral   . ENTEROSCOPY N/A 01/16/2013   Procedure: ENTEROSCOPY;  Surgeon: Danie Binder, MD;  [facility];  Service: Endoscopy;  Laterality: N/A;  2:00  . ESOPHAGOGASTRODUODENOSCOPY N/A 11/22/2015   Procedure: ESOPHAGOGASTRODUODENOSCOPY (EGD);  Surgeon: Danie Binder, MD;  [facility];  Service: Endoscopy;  Laterality: N/A;  130 - moved to 2:00 -  office to notify pt  . GIVENS CAPSULE STUDY N/A 12/31/2012   Procedure: GIVENS CAPSULE STUDY;  Surgeon: Danie Binder, MD;  [facility];  Service: Endoscopy;  Laterality: N/A;  7:30  . ROTATOR CUFF REPAIR Bilateral    bilaterally  . SAVORY DILATION N/A 11/22/2015   Procedure: SAVORY DILATION;  Surgeon: Danie Binder, MD;  [facility];  Service:  Endoscopy;  Laterality: N/A;  . SPINE SURGERY     Lumbar Fusion- Dr. Trenton Gammon  . TONSILECTOMY, ADENOIDECTOMY, BILATERAL MYRINGOTOMY AND TUBES  1987  . TONSILLECTOMY  1987  . TOTAL THYROIDECTOMY  2011  . TUBAL LIGATION      Current Outpatient Medications on File Prior to Visit  Medication Sig Dispense Refill  . amLODipine (NORVASC) 10 MG tablet TAKE 1 TABLET EVERY DAY 90 tablet 3  . aspirin 81 MG tablet Take 81 mg by mouth every other day.    Marland Kitchen atorvastatin (LIPITOR) 40 MG tablet Take 1 tablet (40 mg total) by mouth daily. 90 tablet 3  . Blood Glucose Monitoring Suppl (TRUE METRIX AIR GLUCOSE METER) w/Device KIT USE AS DIRECTED 1 kit 0  . celecoxib (CELEBREX) 100 MG capsule Take 1 capsule (100 mg total) by mouth 2 (two) times daily. 180 capsule 1  . chlorthalidone (HYGROTON) 25 MG tablet 1 tablet every morning    . cyclobenzaprine (FLEXERIL) 10 MG tablet 1 tablet as needed    . diclofenac sodium (VOLTAREN) 1 % GEL Apply dime size to knees three times a day as needed (Patient taking differently: Apply 1 application topically 3 (three) times daily as needed (pain). Apply dime size to knees three times a day as needed) 100 g 3  . FLUoxetine (PROZAC) 40 MG capsule TAKE 1 CAPSULE EVERY DAY BEFORE BREAKFAST 90 capsule 1  . gabapentin (NEURONTIN) 300 MG capsule TAKE 1 CAPSULE THREE TIMES DAILY  (DOSE  CHANGE) 270 capsule 1  . hydrALAZINE (APRESOLINE) 10 MG tablet Take 1 tablet (10 mg total) by mouth 3 (three) times daily. 270 tablet 3  . levothyroxine (SYNTHROID) 175 MCG tablet Take 175 mcg by mouth daily before breakfast.    . metFORMIN (GLUCOPHAGE) 1000 MG tablet TAKE 1 TABLET TWICE DAILY IN THE MORNING  AND IN THE EVENING 180 tablet 1  . nebivolol (BYSTOLIC) 10 MG tablet TAKE 1 TABLET EVERY DAY (Patient taking differently: Take 10 mg by mouth daily. TAKE 1 TABLET EVERY DAY) 90 tablet 2  . omeprazole (PRILOSEC) 40 MG capsule TAKE 1 CAPSULE TWICE DAILY  30  MINUTES  PRIOR  TO  MEALS 180 capsule 3  .  SHINGRIX injection     . simvastatin (ZOCOR) 40 MG tablet 1 tablet every evening (Patient not taking: Reported on 12/17/2020)    . TRUE METRIX BLOOD GLUCOSE TEST test strip CHECK FINGER STICK BLOOD SUGAR TWICE DAILY AS DIRECTED 200 strip 3  . zolpidem (AMBIEN) 5 MG tablet TAKE 1 TABLET EVERY NIGHT AT BEDTIME AS NEEDED FOR SLEEP 30 tablet 3   No current facility-administered medications on file prior to visit.     Allergies  Allergen Reactions  . Lisinopril Anaphylaxis    angioedema  . Other     Other reaction(s): throat swelling    Social History   Occupational History  . Occupation: Disabled    Employer: UNEMPLOYED  Tobacco Use  . Smoking status: Never Smoker  . Smokeless tobacco: Never Used  Vaping Use  . Vaping Use: Never used  Substance and Sexual  Activity  . Alcohol use: Yes    Alcohol/week: 0.0 standard drinks    Comment: Occassional  . Drug use: No  . Sexual activity: Not Currently    Birth control/protection: Surgical    Family History  Problem Relation Age of Onset  . Heart disease Mother   . Lung cancer Father        Age 22  . Colon cancer Brother        Age 71  . Heart attack Daughter 103  . Hypertension Daughter   . Other Brother        Agent orange, age 76, cancer  . Breast cancer Cousin     Immunization History  Administered Date(s) Administered  . Influenza Split 03/30/2014  . Influenza,inj,Quad PF,6+ Mos 04/17/2016, 05/30/2017, 04/30/2019, 04/09/2020  . Influenza,trivalent, recombinat, inj, PF 05/08/2015  . Influenza-Unspecified 05/10/2018  . Moderna Sars-Covid-2 Vaccination 09/29/2019, 10/27/2019, 05/31/2020  . Pneumococcal Polysaccharide-23 05/13/2013    Objective: There were no vitals filed for this visit.  Autumn Carson is a pleasant 65 y.o. female morbidly obese in NAD. AAO X 3.  Vascular Examination: Capillary fill time to digits <3 seconds b/l lower extremities. Palpable pedal pulses b/l LE. Pedal hair present. Lower  extremity skin temperature gradient within normal limits.  Dermatological Examination: Pedal skin with normal turgor, texture and tone bilaterally. No open wounds bilaterally. No interdigital macerations bilaterally. Toenails 1-5 b/l elongated, discolored, dystrophic, thickened, crumbly with subungual debris and tenderness to dorsal palpation.  Musculoskeletal Examination: Normal muscle strength 5/5 to all lower extremity muscle groups bilaterally. No pain crepitus or joint limitation noted with ROM b/l. No gross bony deformities bilaterally.  Footwear Assessment: Does the patient wear appropriate shoes? Yes. Does the patient need inserts/orthotics? No.  Neurological Examination: Protective sensation intact 5/5 intact bilaterally with 10g monofilament b/l. Vibratory sensation intact b/l. Clonus negative b/l.  Lab: Hemoglobin A1C Latest Ref Rng & Units 06/06/2020 02/02/2020  HGBA1C <5.7 % of total Hgb 6.7(H) 6.2(H)  Some recent data might be hidden   Assessment: 1. Pain due to onychomycosis of nail   2. Pain due to onychomycosis of toenails of both feet   3. Type 2 diabetes mellitus with diabetic autonomic neuropathy, without long-term current use of insulin ([place])   4. Encounter for diabetic foot exam ([place])     ADA Risk Categorization:  Low Risk:  Patient has all of the following: Intact protective sensation No prior foot ulcer  No severe deformity Pedal pulses present  Plan: -Examined patient. -Diabetic foot examination performed on today's visit. -Discussed diabetic foot care principles. Literature dispensed on today. -Patient to continue soft, supportive shoe gear daily. -Toenails 1-5 b/l were debrided in length and girth with sterile nail nippers and dremel without iatrogenic bleeding.  -Patient to report any pedal injuries to medical professional immediately. -Discussed treatment options for onychomycosis. She opted for topical treatment. Rx written for Ciclopirox Nail  Lacquer 8% to be applied to affected toenails once daily for 48 weeks and removed once weekly with nail polish remover. -Patient/POA to call should there be question/concern in the interim.  Return in about 3 months (around 03/15/2021).  Marzetta Board, DPM

## 2020-12-17 ENCOUNTER — Encounter: Payer: Self-pay | Admitting: Nurse Practitioner

## 2021-02-07 ENCOUNTER — Other Ambulatory Visit: Payer: Self-pay

## 2021-02-07 ENCOUNTER — Encounter: Payer: Self-pay | Admitting: Nurse Practitioner

## 2021-02-07 ENCOUNTER — Other Ambulatory Visit: Payer: Self-pay | Admitting: Nurse Practitioner

## 2021-02-07 ENCOUNTER — Ambulatory Visit: Payer: Medicare HMO | Attending: Nurse Practitioner | Admitting: Nurse Practitioner

## 2021-02-07 VITALS — BP 145/68 | HR 68 | Ht 67.0 in | Wt 327.0 lb

## 2021-02-07 DIAGNOSIS — I10 Essential (primary) hypertension: Secondary | ICD-10-CM

## 2021-02-07 DIAGNOSIS — E890 Postprocedural hypothyroidism: Secondary | ICD-10-CM | POA: Diagnosis not present

## 2021-02-07 DIAGNOSIS — R42 Dizziness and giddiness: Secondary | ICD-10-CM | POA: Diagnosis not present

## 2021-02-07 DIAGNOSIS — E1142 Type 2 diabetes mellitus with diabetic polyneuropathy: Secondary | ICD-10-CM

## 2021-02-07 DIAGNOSIS — F5104 Psychophysiologic insomnia: Secondary | ICD-10-CM

## 2021-02-07 DIAGNOSIS — E785 Hyperlipidemia, unspecified: Secondary | ICD-10-CM

## 2021-02-07 DIAGNOSIS — D649 Anemia, unspecified: Secondary | ICD-10-CM

## 2021-02-07 DIAGNOSIS — R519 Headache, unspecified: Secondary | ICD-10-CM | POA: Diagnosis not present

## 2021-02-07 MED ORDER — METFORMIN HCL 1000 MG PO TABS: 1000.0000 mg | ORAL_TABLET | Freq: Two times a day (BID) | ORAL | 1 refills | Status: DC

## 2021-02-07 MED ORDER — HYDRALAZINE HCL 10 MG PO TABS: 10.0000 mg | ORAL_TABLET | Freq: Three times a day (TID) | ORAL | 3 refills | Status: DC

## 2021-02-07 MED ORDER — ZOLPIDEM TARTRATE 5 MG PO TABS: ORAL_TABLET | ORAL | 3 refills | Status: DC

## 2021-02-07 MED ORDER — NEBIVOLOL HCL 10 MG PO TABS
10.0000 mg | ORAL_TABLET | Freq: Every day | ORAL | 0 refills | Status: DC
Start: 2021-02-07 — End: 2021-03-23

## 2021-02-07 MED ORDER — CHLORTHALIDONE 25 MG PO TABS: 25.0000 mg | ORAL_TABLET | Freq: Every day | ORAL | 0 refills | Status: DC

## 2021-02-07 MED ORDER — AMITRIPTYLINE HCL 10 MG PO TABS: 10.0000 mg | ORAL_TABLET | Freq: Every day | ORAL | 0 refills | Status: DC

## 2021-02-07 NOTE — Progress Notes (Signed)
Assessment & Plan:  Autumn Carson was seen today for diabetes.  Diagnoses and all orders for this visit:  Occipital headache -     Sedimentation Rate -     C-reactive protein -     amitriptyline (ELAVIL) 10 MG tablet; Take 1 tablet (10 mg total) by mouth at bedtime.  Dizziness after extension of neck -     US Carotid Duplex Bilateral; Future -     VAS US CAROTID; Future  Type 2 diabetes mellitus with diabetic polyneuropathy, without long-term current use of insulin (HCC) -     Hemoglobin A1c -     CMP14+EGFR Continue blood sugar control as discussed in office today, low carbohydrate diet, and regular physical exercise as tolerated, 150 minutes per week (30 min each day, 5 days per week, or 50 min 3 days per week). Keep blood sugar logs with fasting goal of 90-130 mg/dl, post prandial (after you eat) less than 180.  For Hypoglycemia: BS <60 and Hyperglycemia BS >400; contact the clinic ASAP. Annual eye exams and foot exams are recommended.   Dyslipidemia, goal LDL below 70 -     Lipid panel -     US Carotid Duplex Bilateral; Future INSTRUCTIONS: Work on a low fat, heart healthy diet and participate in regular aerobic exercise program by working out at least 150 minutes per week; 5 days a week-30 minutes per day. Avoid red meat/beef/steak,  fried foods. junk foods, sodas, sugary drinks, unhealthy snacking, alcohol and smoking.  Drink at least 80 oz of water per day and monitor your carbohydrate intake daily.    Postoperative hypothyroidism  Low hemoglobin -     CBC   Patient has been counseled on age-appropriate routine health concerns for screening and prevention. These are reviewed and up-to-date. Referrals have been placed accordingly. Immunizations are up-to-date or declined.    Subjective:   Chief Complaint  Patient presents with   Diabetes   HPI Autumn Carson 65 y.o. female presents to office today for follow up to chronic conditions She has a past medical history  of Anxiety, Arthritis of both knees, Back pain, Chronic anemia, Depression (1998), Essential hypertension, GERD, History of thyroid cancer (2011), Hypothyroidism, Lumbar spinal stenosis, Morbid Obesity,Seasonal allergies, and Type 2 diabetes mellitus with diabetic neuropathy ([place]).    Being followed by Endo for postoperative hypothyroidism  Endorses unilateral left sided occipital headache radiating down into the left side of her neck. Occurring every day and lasting sometimes for hours. She has only taken ASA for her headaches.   Postural Dizziness occurs when she turns her head to the left while lying in the bed. Also endorses dizziness when she bends her head forward. She is not a smoker. States she has been treated for vertigo in the past with meclizine however her current symptoms are not the same as her vertigo symptoms.  Lab Results  Component Value Date   CHOL 225 (H) 06/06/2020   CHOL 163 09/29/2019   CHOL 190 05/25/2019   Lab Results  Component Value Date   HDL 54 06/06/2020   HDL 50 09/29/2019   HDL 48 (L) 05/25/2019   Lab Results  Component Value Date   LDLCALC 140 (H) 06/06/2020   LDLCALC 90 09/29/2019   LDLCALC 106 (H) 05/25/2019   Lab Results  Component Value Date   TRIG 168 (H) 06/06/2020   TRIG 135 09/29/2019   TRIG 251 (H) 05/25/2019   Lab Results  Component Value Date   CHOLHDL  4.2 06/06/2020   CHOLHDL 3.3 09/29/2019   CHOLHDL 4.0 05/25/2019    Essential Hypertension Blood pressure not at goal. She endorses adherence taking chlorthalidone 25 mg daily, bystolic 10 md daily and hydralazine 10 mg TID. Denies chest pain, shortness of breath, palpitations, lightheadedness, dizziness, headaches or BLE edema.   BP Readings from Last 3 Encounters:  02/07/21 (!) 145/68  06/06/20 138/74  02/13/20 (!) 125/50    DM TYPE 2 Well controlled with metformin 1000 mg BID. LDL not at goal with zocor 40 mg daily. She denies any symptoms of hypo or hyperglycemia Lab  Results  Component Value Date   HGBA1C 6.7 (H) 06/06/2020    Lab Results  Component Value Date   LDLCALC 140 (H) 06/06/2020    Review of Systems  Constitutional:  Negative for fever, malaise/fatigue and weight loss.  HENT: Negative.  Negative for nosebleeds.   Eyes: Negative.  Negative for blurred vision, double vision and photophobia.  Respiratory: Negative.  Negative for cough and shortness of breath.   Cardiovascular: Negative.  Negative for chest pain, palpitations and leg swelling.  Gastrointestinal: Negative.  Negative for heartburn, nausea and vomiting.  Musculoskeletal:  Positive for back pain, joint pain and neck pain. Negative for myalgias.  Neurological:  Positive for dizziness and headaches. Negative for focal weakness and seizures.  Psychiatric/Behavioral: Negative.  Negative for suicidal ideas.    Past Medical History:  Diagnosis Date   Anxiety    Arthritis of both knees    Back pain    Cancer (HCC)    Thyroid   Chronic anemia    Depression 1998   Essential hypertension    GERD (gastroesophageal reflux disease)    History of thyroid cancer 2011   Papillary thyroid cancer s/p radiation   Hypothyroidism    Lumbar spinal stenosis    Obesity    PONV (postoperative nausea and vomiting)    Seasonal allergies    Type 2 diabetes mellitus with diabetic neuropathy ([place])     Past Surgical History:  Procedure Laterality Date   BACK SURGERY     BIOPSY N/A 01/16/2013   Procedure: BIOPSY;  Surgeon: Danie Binder, MD;  [facility];  Service: Endoscopy;  Laterality: N/A;  SMALL BOWEL ULCERS   BRAVO [place] STUDY N/A 11/22/2015   Procedure: BRAVO [place];  Surgeon: Danie Binder, MD;  [facility];  Service: Endoscopy;  Laterality: N/A;   CARPAL TUNNEL RELEASE     4 total, bilaterally   CESAREAN SECTION     CHOLECYSTECTOMY  1990's   COLONOSCOPY WITH ESOPHAGOGASTRODUODENOSCOPY (EGD) N/A 12/08/2012   SLF: 1. Normal mucosa in the terminal ileum 2. mild  diverticulosis throughout the entire examined colon. 3. rectal bleeding due to moderate sized internal hemorrhoids.    ELBOW SURGERY Bilateral    ENTEROSCOPY N/A 01/16/2013   Procedure: ENTEROSCOPY;  Surgeon: Danie Binder, MD;  [facility];  Service: Endoscopy;  Laterality: N/A;  2:00   ESOPHAGOGASTRODUODENOSCOPY N/A 11/22/2015   Procedure: ESOPHAGOGASTRODUODENOSCOPY (EGD);  Surgeon: Danie Binder, MD;  [facility];  Service: Endoscopy;  Laterality: N/A;  130 - moved to 2:00 - office to notify pt   GIVENS CAPSULE STUDY N/A 12/31/2012   Procedure: GIVENS CAPSULE STUDY;  Surgeon: Danie Binder, MD;  [facility];  Service: Endoscopy;  Laterality: N/A;  7:30   ROTATOR CUFF REPAIR Bilateral    bilaterally   SAVORY DILATION N/A 11/22/2015  Procedure: SAVORY DILATION;  Surgeon: Danie Binder, MD;  [facility];  Service: Endoscopy;  Laterality: N/A;   SPINE SURGERY     Lumbar Fusion- Dr. Trenton Gammon   TONSILECTOMY, ADENOIDECTOMY, BILATERAL [place]   TOTAL THYROIDECTOMY  2011   TUBAL LIGATION      Family History  Problem Relation Age of Onset   Heart disease Mother    Lung cancer Father        Age 53   Colon cancer Brother        Age 46   Heart attack Daughter 37   Hypertension Daughter    Other Brother        Archivist, age 1, cancer   Breast cancer Cousin     Social History Reviewed with no changes to be made today.   Outpatient Medications Prior to Visit  Medication Sig Dispense Refill   amLODipine (NORVASC) 10 MG tablet TAKE 1 TABLET EVERY DAY 90 tablet 3   aspirin 81 MG tablet Take 81 mg by mouth every other day.     atorvastatin (LIPITOR) 40 MG tablet Take 1 tablet (40 mg total) by mouth daily. 90 tablet 3   Blood Glucose Monitoring Suppl (TRUE METRIX AIR GLUCOSE METER) w/Device KIT USE AS DIRECTED 1 kit 0   celecoxib (CELEBREX) 100 MG capsule Take 1 capsule (100 mg total) by mouth 2 (two)  times daily. 180 capsule 1   chlorthalidone (HYGROTON) 25 MG tablet 1 tablet every morning     ciclopirox (PENLAC) 8 % solution Apply one coat to toenail qd for 48 weeks.  Remove weekly with polish remover. 6.6 mL 11   cyclobenzaprine (FLEXERIL) 10 MG tablet 1 tablet as needed     diclofenac sodium (VOLTAREN) 1 % GEL Apply dime size to knees three times a day as needed (Patient taking differently: Apply 1 application topically 3 (three) times daily as needed (pain). Apply dime size to knees three times a day as needed) 100 g 3   FLUoxetine (PROZAC) 40 MG capsule TAKE 1 CAPSULE EVERY DAY BEFORE BREAKFAST 90 capsule 1   gabapentin (NEURONTIN) 300 MG capsule TAKE 1 CAPSULE THREE TIMES DAILY  (DOSE  CHANGE) 270 capsule 1   hydrALAZINE (APRESOLINE) 10 MG tablet Take 1 tablet (10 mg total) by mouth 3 (three) times daily. 270 tablet 3   levothyroxine (SYNTHROID) 175 MCG tablet Take 175 mcg by mouth daily before breakfast.     metFORMIN (GLUCOPHAGE) 1000 MG tablet TAKE 1 TABLET TWICE DAILY IN THE MORNING  AND IN THE EVENING 180 tablet 1   nebivolol (BYSTOLIC) 10 MG tablet TAKE 1 TABLET EVERY DAY (Patient taking differently: Take 10 mg by mouth daily. TAKE 1 TABLET EVERY DAY) 90 tablet 2   omeprazole (PRILOSEC) 40 MG capsule TAKE 1 CAPSULE TWICE DAILY  30  MINUTES  PRIOR  TO  MEALS 180 capsule 3   SHINGRIX injection      simvastatin (ZOCOR) 40 MG tablet      TRUE METRIX BLOOD GLUCOSE TEST test strip CHECK FINGER STICK BLOOD SUGAR TWICE DAILY AS DIRECTED 200 strip 3   zolpidem (AMBIEN) 5 MG tablet TAKE 1 TABLET EVERY NIGHT AT BEDTIME AS NEEDED FOR SLEEP 30 tablet 3   No facility-administered medications prior to visit.    Allergies  Allergen Reactions   Lisinopril Anaphylaxis    angioedema   Other     Other reaction(s): throat swelling  Objective:    BP (!) 145/68   Pulse 68   Ht _0  (1.702 m)   Wt (!) 327 lb (148.3 kg)   LMP 09/07/2011   SpO2 98%   BMI 51.22 kg/m  Wt Readings  from Last 3 Encounters:  02/07/21 (!) 327 lb (148.3 kg)  06/06/20 (!) 327 lb (148.3 kg)  02/12/20 (!) 314 lb (142.4 kg)    Physical Exam Vitals and nursing note reviewed.  Constitutional:      Appearance: She is well-developed. She is obese.  HENT:     Head: Normocephalic and atraumatic.  Eyes:     General: Lids are normal.     Extraocular Movements: Extraocular movements intact.     Conjunctiva/sclera: Conjunctivae normal.  Cardiovascular:     Rate and Rhythm: Normal rate and regular rhythm.     Heart sounds: Normal heart sounds. No murmur heard.   No friction rub. No gallop.  Pulmonary:     Effort: Pulmonary effort is normal. No tachypnea or respiratory distress.     Breath sounds: Normal breath sounds. No decreased breath sounds, wheezing, rhonchi or rales.  Chest:     Chest wall: No tenderness.  Abdominal:     General: Bowel sounds are normal.     Palpations: Abdomen is soft.  Musculoskeletal:        General: Swelling (bilateral knees) present. Normal range of motion.     Cervical back: Normal range of motion.  Skin:    General: Skin is warm and dry.  Neurological:     Mental Status: She is alert and oriented to person, place, and time.     Coordination: Coordination normal.  Psychiatric:        Behavior: Behavior normal. Behavior is cooperative.        Thought Content: Thought content normal.        Judgment: Judgment normal.         Patient has been counseled extensively about nutrition and exercise as well as the importance of adherence with medications and regular follow-up. The patient was given clear instructions to go to ER or return to medical center if symptoms don't improve, worsen or new problems develop. The patient verbalized understanding.   Follow-up: Return in about 2 weeks (around 02/21/2021) for tele with me 810 or 130.   Gildardo Pounds, FNP-BC [facility] and [place] [place], [place]   02/07/2021, 12:56  PM

## 2021-02-08 ENCOUNTER — Other Ambulatory Visit: Payer: Self-pay | Admitting: Podiatry

## 2021-02-08 DIAGNOSIS — B351 Tinea unguium: Secondary | ICD-10-CM

## 2021-02-08 LAB — CBC
Hematocrit: 34.2 % (ref 34.0–46.6)
Hemoglobin: 11 g/dL — ABNORMAL LOW (ref 11.1–15.9)
MCH: 25.9 pg — ABNORMAL LOW (ref 26.6–33.0)
MCHC: 32.2 g/dL (ref 31.5–35.7)
MCV: 81 fL (ref 79–97)
Platelets: 251 10*3/uL (ref 150–450)
RBC: 4.25 x10E6/uL (ref 3.77–5.28)
RDW: 15 % (ref 11.7–15.4)
WBC: 5.3 10*3/uL (ref 3.4–10.8)

## 2021-02-08 LAB — CMP14+EGFR
ALT: 10 IU/L (ref 0–32)
AST: 15 IU/L (ref 0–40)
Albumin/Globulin Ratio: 2.6 — ABNORMAL HIGH (ref 1.2–2.2)
Albumin: 4.7 g/dL (ref 3.8–4.8)
Alkaline Phosphatase: 74 IU/L (ref 44–121)
BUN/Creatinine Ratio: 10 — ABNORMAL LOW (ref 12–28)
BUN: 11 mg/dL (ref 8–27)
Bilirubin Total: <0.2 mg/dL (ref 0.0–1.2)
CO2: 24 mmol/L (ref 20–29)
Calcium: 9.3 mg/dL (ref 8.7–10.3)
Chloride: 102 mmol/L (ref 96–106)
Creatinine, Ser: 1.07 mg/dL — ABNORMAL HIGH (ref 0.57–1.00)
Globulin, Total: 1.8 g/dL (ref 1.5–4.5)
Glucose: 119 mg/dL — ABNORMAL HIGH (ref 65–99)
Potassium: 4.7 mmol/L (ref 3.5–5.2)
Sodium: 139 mmol/L (ref 134–144)
Total Protein: 6.5 g/dL (ref 6.0–8.5)
eGFR: 58 mL/min/{1.73_m2} — ABNORMAL LOW (ref >59)

## 2021-02-08 LAB — LIPID PANEL
Chol/HDL Ratio: 3.1 ratio (ref 0.0–4.4)
Cholesterol, Total: 158 mg/dL (ref 100–199)
HDL: 51 mg/dL (ref >39)
LDL Chol Calc (NIH): 88 mg/dL (ref 0–99)
Triglycerides: 106 mg/dL (ref 0–149)
VLDL Cholesterol Cal: 19 mg/dL (ref 5–40)

## 2021-02-08 LAB — HEMOGLOBIN A1C
Est. average glucose Bld gHb Est-mCnc: 146 mg/dL
Hgb A1c MFr Bld: 6.7 % — ABNORMAL HIGH (ref 4.8–5.6)

## 2021-02-08 LAB — SEDIMENTATION RATE: Sed Rate: 41 mm/hr — ABNORMAL HIGH (ref 0–40)

## 2021-02-08 LAB — C-REACTIVE PROTEIN: CRP: 12 mg/L — ABNORMAL HIGH (ref 0–10)

## 2021-02-08 NOTE — Telephone Encounter (Signed)
Prescription written in May. She has 11 refills. She just needs to contact her mail order pharmacy for them to send a new bottle. Thanks!

## 2021-02-08 NOTE — Telephone Encounter (Signed)
Refill on nail lacquer

## 2021-02-08 NOTE — Telephone Encounter (Signed)
Please advise 

## 2021-02-09 NOTE — Telephone Encounter (Signed)
Called patient and gave information per Dr Elisha Ponder concerning her prescription request thru vmessage.

## 2021-02-09 NOTE — Telephone Encounter (Signed)
Please advise 

## 2021-02-10 ENCOUNTER — Other Ambulatory Visit: Payer: Self-pay | Admitting: Nurse Practitioner

## 2021-02-10 DIAGNOSIS — R519 Headache, unspecified: Secondary | ICD-10-CM

## 2021-02-13 ENCOUNTER — Ambulatory Visit (HOSPITAL_COMMUNITY)
Admission: RE | Admit: 2021-02-13 | Discharge: 2021-02-13 | Disposition: A | Discharge status: Home / Self Care | Payer: Medicare HMO | Source: Ambulatory Visit | Attending: Nurse Practitioner | Admitting: Nurse Practitioner

## 2021-02-13 ENCOUNTER — Other Ambulatory Visit: Payer: Self-pay

## 2021-02-13 DIAGNOSIS — R42 Dizziness and giddiness: Secondary | ICD-10-CM | POA: Insufficient documentation

## 2021-02-13 NOTE — Telephone Encounter (Signed)
Has this been addressed? She needs to contact her mail order pharmacy for refills. I gave her 11 refills.

## 2021-02-13 NOTE — Telephone Encounter (Signed)
Please advise 

## 2021-02-13 NOTE — Progress Notes (Signed)
Carotid duplex has been completed.   Preliminary results in CV Proc.   Autumn Carson Autumn Carson 02/13/2021 10:02 AM

## 2021-02-21 ENCOUNTER — Encounter: Payer: Self-pay | Admitting: Nurse Practitioner

## 2021-02-21 ENCOUNTER — Ambulatory Visit: Payer: Medicare HMO | Attending: Nurse Practitioner | Admitting: Nurse Practitioner

## 2021-02-21 ENCOUNTER — Other Ambulatory Visit: Payer: Self-pay

## 2021-02-21 DIAGNOSIS — R519 Headache, unspecified: Secondary | ICD-10-CM | POA: Diagnosis not present

## 2021-02-21 DIAGNOSIS — M4316 Spondylolisthesis, lumbar region: Secondary | ICD-10-CM

## 2021-02-21 NOTE — Progress Notes (Signed)
Virtual Visit via Telephone Note Due to national recommendations of social distancing due to [place] 19, telehealth visit is felt to be most appropriate for this patient at this time.  I discussed the limitations, risks, security and privacy concerns of performing an evaluation and management service by telephone and the availability of in person appointments. I also discussed with the patient that there may be a patient responsible charge related to this service. The patient expressed understanding and agreed to proceed.    I connected with Autumn Carson on 02/21/21  at   8:10 AM EDT  EDT by telephone and verified that I am speaking with the correct person using two identifiers.  Location of Patient: Private Residence   Location of Provider: [place] and [place] participating in Telemedicine visit: [name] FNP-BC [name]    History of Present Illness: Telemedicine visit for: Occipital headaches  She was started on elavil for her headaches a few weeks ago. Today states she has not experienced any headaches over the past several days and feels the medication is working.   Endorses chronic back pain with right sided sciatica. Declines referral to ortho. States she has had 2 surgeries and does not want any more. BMI 51.22. Currently taking gabapentin twice a day. I have recommended she take it 3 times per day. Back pain worse at night. She can take 2 gabapentin at night and 1 during the day. She denies any involuntary loss of urine or stool.     Past Medical History:  Diagnosis Date   Anxiety    Arthritis of both knees    Back pain    Cancer (HCC)    Thyroid   Chronic anemia    Depression 1998   Essential hypertension    GERD (gastroesophageal reflux disease)    History of thyroid cancer 2011   Papillary thyroid cancer s/p radiation   Hypothyroidism    Lumbar spinal stenosis    Obesity    PONV (postoperative nausea  and vomiting)    Seasonal allergies    Type 2 diabetes mellitus with diabetic neuropathy ([place])     Past Surgical History:  Procedure Laterality Date   BACK SURGERY     BIOPSY N/A 01/16/2013   Procedure: BIOPSY;  Surgeon: Danie Binder, MD;  [facility];  Service: Endoscopy;  Laterality: N/A;  SMALL BOWEL ULCERS   BRAVO [place] STUDY N/A 11/22/2015   Procedure: BRAVO [place];  Surgeon: Danie Binder, MD;  [facility];  Service: Endoscopy;  Laterality: N/A;   CARPAL TUNNEL RELEASE     4 total, bilaterally   CESAREAN SECTION     CHOLECYSTECTOMY  1990's   COLONOSCOPY WITH ESOPHAGOGASTRODUODENOSCOPY (EGD) N/A 12/08/2012   SLF: 1. Normal mucosa in the terminal ileum 2. mild diverticulosis throughout the entire examined colon. 3. rectal bleeding due to moderate sized internal hemorrhoids.    ELBOW SURGERY Bilateral    ENTEROSCOPY N/A 01/16/2013   Procedure: ENTEROSCOPY;  Surgeon: Danie Binder, MD;  [facility];  Service: Endoscopy;  Laterality: N/A;  2:00   ESOPHAGOGASTRODUODENOSCOPY N/A 11/22/2015   Procedure: ESOPHAGOGASTRODUODENOSCOPY (EGD);  Surgeon: Danie Binder, MD;  [facility];  Service: Endoscopy;  Laterality: N/A;  130 - moved to 2:00 - office to notify pt   GIVENS CAPSULE STUDY N/A 12/31/2012   Procedure: GIVENS CAPSULE STUDY;  Surgeon: Danie Binder, MD;  [facility];  Service: Endoscopy;  Laterality: N/A;  7:30   ROTATOR CUFF REPAIR Bilateral    bilaterally   SAVORY DILATION N/A 11/22/2015   Procedure: SAVORY DILATION;  Surgeon: Danie Binder, MD;  [facility];  Service: Endoscopy;  Laterality: N/A;   SPINE SURGERY     Lumbar Fusion- Dr. Trenton Gammon   TONSILECTOMY, ADENOIDECTOMY, BILATERAL [place]   TOTAL THYROIDECTOMY  2011   TUBAL LIGATION      Family History  Problem Relation Age of Onset   Heart disease Mother    Lung cancer Father        Age 35   Colon cancer  Brother        Age 11   Heart attack Daughter 29   Hypertension Daughter    Other Brother        Archivist, age 59, cancer   Breast cancer Cousin     Social History   Socioeconomic History   Marital status: Divorced    Spouse name: Not on file   Number of children: 2   Years of education: Not on file   Highest education level: Not on file  Occupational History   Occupation: Disabled    Employer: UNEMPLOYED  Tobacco Use   Smoking status: Never   Smokeless tobacco: Never  Vaping Use   Vaping Use: Never used  Substance and Sexual Activity   Alcohol use: Yes    Alcohol/week: 0.0 standard drinks    Comment: Occassional   Drug use: No   Sexual activity: Not Currently    Birth control/protection: Surgical  Other Topics Concern   Not on file  Social History Narrative   Not on file   Social Determinants of Health   Financial Resource Strain: Not on file  Food Insecurity: Not on file  Transportation Needs: Not on file  Physical Activity: Not on file  Stress: Not on file  Social Connections: Not on file     Observations/Objective: Awake, alert and oriented x 3   Review of Systems  Constitutional:  Negative for fever, malaise/fatigue and weight loss.  HENT: Negative.  Negative for nosebleeds.   Eyes: Negative.  Negative for blurred vision, double vision and photophobia.  Respiratory: Negative.  Negative for cough and shortness of breath.   Cardiovascular: Negative.  Negative for chest pain, palpitations and leg swelling.  Gastrointestinal: Negative.  Negative for heartburn, nausea and vomiting.  Musculoskeletal:  Positive for back pain. Negative for myalgias.  Neurological:  Positive for sensory change. Negative for dizziness, focal weakness, seizures and headaches.  Psychiatric/Behavioral: Negative.  Negative for suicidal ideas.    Assessment and Plan: Diagnoses and all orders for this visit:  Occipital headache Continue elavil   Spondylolisthesis of lumbar  region Continue gabapentin as instructed Needs to work on losing weight to help with back pain    Follow Up Instructions Return in about 3 months (around 05/24/2021).     I discussed the assessment and treatment plan with the patient. The patient was provided an opportunity to ask questions and all were answered. The patient agreed with the plan and demonstrated an understanding of the instructions.   The patient was advised to call back or seek an in-person evaluation if the symptoms worsen or if the condition fails to improve as anticipated.  I provided 13 minutes of non-face-to-face time during this encounter including median intraservice time, reviewing previous notes, labs, imaging, medications and explaining diagnosis and management.  Gildardo Pounds, FNP-BC

## 2021-02-24 ENCOUNTER — Other Ambulatory Visit: Payer: Self-pay | Admitting: Nurse Practitioner

## 2021-02-24 NOTE — Telephone Encounter (Signed)
Medication Refill - Medication:  celecoxib (CELEBREX) 100 MG capsule   Has the patient contacted their pharmacy? Yes, pharmacy called on behalf of pt  Preferred Pharmacy (with phone number or street name):  [facility] ([facility] - [place], [place]  Phone:  [phone] Fax:  [phone]  Agent: Please be advised that RX refills may take up to 3 business days. We ask that you follow-up with your pharmacy.

## 2021-02-24 NOTE — Telephone Encounter (Signed)
  Notes to clinic:  medication filled by a different provider  Review for continued use and refill    Requested Prescriptions  Pending Prescriptions Disp Refills   celecoxib (CELEBREX) 100 MG capsule 180 capsule 1    Sig: Take 1 capsule (100 mg total) by mouth 2 (two) times daily.      Analgesics:  COX2 Inhibitors Failed - 02/24/2021  2:02 PM      Failed - HGB in normal range and within 360 days    Hemoglobin  Date Value Ref Range Status  02/07/2021 11.0 (L) 11.1 - 15.9 g/dL Final          Failed - Cr in normal range and within 360 days    Creat  Date Value Ref Range Status  06/06/2020 0.84 0.50 - 0.99 mg/dL Final    Comment:    For patients >49 years of age, the reference limit for Creatinine is approximately 13% higher for people identified as African-American. .    Creatinine, Ser  Date Value Ref Range Status  02/07/2021 1.07 (H) 0.57 - 1.00 mg/dL Final   Creatinine, Urine  Date Value Ref Range Status  06/06/2020 119 20 - 275 mg/dL Final          Passed - Patient is not pregnant      Passed - Valid encounter within last 12 months    Recent Outpatient Visits           3 days ago Occipital headache   [place], Vernia Buff, NP   2 weeks ago Occipital headache   [place], [place] W, NP   2 months ago Type 2 diabetes mellitus with diabetic polyneuropathy, without long-term current use of insulin [facility])   [place], Vernia Buff, NP

## 2021-03-14 ENCOUNTER — Other Ambulatory Visit: Payer: Self-pay

## 2021-03-14 ENCOUNTER — Ambulatory Visit: Payer: Medicare HMO | Admitting: Podiatry

## 2021-03-14 ENCOUNTER — Telehealth: Payer: Self-pay | Admitting: Nurse Practitioner

## 2021-03-14 DIAGNOSIS — M79675 Pain in left toe(s): Secondary | ICD-10-CM

## 2021-03-14 DIAGNOSIS — E1143 Type 2 diabetes mellitus with diabetic autonomic (poly)neuropathy: Secondary | ICD-10-CM

## 2021-03-14 DIAGNOSIS — B351 Tinea unguium: Secondary | ICD-10-CM

## 2021-03-14 DIAGNOSIS — M79674 Pain in right toe(s): Secondary | ICD-10-CM

## 2021-03-14 NOTE — Telephone Encounter (Signed)
Form has been received and pt will be called once ready.

## 2021-03-14 NOTE — Telephone Encounter (Signed)
Patient dropped off forms for her handicap sticker to be filled out by Zelda. Form has been placed in PCP box

## 2021-03-14 NOTE — Progress Notes (Signed)
  Subjective:  Patient ID: Autumn Carson, female    DOB: [birth year],  MRN: [id number]  65 y.o. female presents with at risk foot care with history of diabetic neuropathy and painful thick toenails that are difficult to trim. Pain interferes with ambulation. Aggravating factors include wearing enclosed shoe gear. Pain is relieved with periodic professional debridement..    Patient's blood sugar was 108 mg/dl this morning. Last A1c was 6.7%.  She states she is using the Ciclopirox antifungal nail solution about five times weekly.   She does relate some discomfort of the left great toe medial border. Denies any redness, drainage or swelling, but it does hurt when pressure is applied.  PCP: Gildardo Pounds, NP and last visit was: 02/21/2021 via video visit.  Review of Systems: Negative except as noted in the HPI.   Allergies  Allergen Reactions   Lisinopril Anaphylaxis    angioedema   Other     Other reaction(s): throat swelling    Objective:  There were no vitals filed for this visit. Constitutional Patient is a pleasant 65 y.o. African American female morbidly obese in NAD. AAO x 3.  Vascular Capillary fill time to digits <3 seconds b/l lower extremities. Palpable DP pulse(s) b/l lower extremities Palpable PT pulse(s) b/l lower extremities Pedal hair present. Lower extremity skin temperature gradient within normal limits. No cyanosis or clubbing noted.  Neurologic Normal speech. Pt has subjective symptoms of neuropathy. Protective sensation intact 5/5 intact bilaterally with 10g monofilament b/l. Vibratory sensation intact b/l.  Dermatologic Skin warm and supple b/l lower extremities. No open wounds b/l lower extremities. No interdigital macerations b/l lower extremities. Toenails 1-5 b/l elongated, discolored, dystrophic, thickened, crumbly with subungual debris and tenderness to dorsal palpation. Incurvated nailplate medial border(s) L hallux.  Nail border hypertrophy absent.  There is tenderness to palpation. Sign(s) of infection: no clinical signs of infection noted on examination today..  Orthopedic: Normal muscle strength 5/5 to all lower extremity muscle groups bilaterally. No pain crepitus or joint limitation noted with ROM b/l lower extremities. No gross bony deformities b/l lower extremities.   Hemoglobin A1C Latest Ref Rng & Units 02/07/2021 06/06/2020  HGBA1C 4.8 - 5.6 % 6.7(H) 6.7(H)  Some recent data might be hidden   Assessment:   1. Pain due to onychomycosis of toenails of both feet   2. Type 2 diabetes mellitus with diabetic autonomic neuropathy, without long-term current use of insulin ([place])    Plan:  Patient was evaluated and treated and all questions answered.  Onychomycosis with pain -Nails palliatively debridement as below. -Educated on self-care  Procedure: Nail Debridement Rationale: Pain Type of Debridement: manual, sharp debridement. Instrumentation: Nail nipper, rotary burr. Number of Nails: 10  -Examined patient. -Continue ciclopirox solution to toenails as instructed. -Continue diabetic foot care principles: inspect feet daily, monitor glucose as recommended by PCP and/or Endocrinologist, and follow prescribed diet per PCP, Endocrinologist and/or dietician. -Patient to continue soft, supportive shoe gear daily. -Toenails 1-5 b/l were debrided in length and girth with sterile nail nippers and dremel without iatrogenic bleeding.  -Offending nail border debrided and curretaged L hallux utilizing sterile nail nipper and currette. Border cleansed with alcohol and triple antibiotic applied. No further treatment required by patient/caregiver. -Patient to report any pedal injuries to medical professional immediately. -Patient/POA to call should there be question/concern in the interim.  Return in about 3 months (around 06/14/2021).  Marzetta Board, DPM

## 2021-03-18 ENCOUNTER — Encounter: Payer: Self-pay | Admitting: Podiatry

## 2021-03-22 ENCOUNTER — Other Ambulatory Visit: Payer: Self-pay | Admitting: Family Medicine

## 2021-03-22 ENCOUNTER — Other Ambulatory Visit: Payer: Self-pay | Admitting: Nurse Practitioner

## 2021-03-22 DIAGNOSIS — I10 Essential (primary) hypertension: Secondary | ICD-10-CM

## 2021-03-22 DIAGNOSIS — R519 Headache, unspecified: Secondary | ICD-10-CM

## 2021-03-23 NOTE — Telephone Encounter (Signed)
Requested medications are due for refill today.  yes  Requested medications are on the active medications list.  yes  Last refill. 7/122022  Future visit scheduled.   yes  Notes to clinic.  Protocol gives drug interactions. Please advise.

## 2021-03-31 NOTE — Patient Instructions (Signed)
Health Maintenance, Female Adopting a healthy lifestyle and getting preventive care are important in promoting health and wellness. Ask your health care provider about: The right schedule for you to have regular tests and exams. Things you can do on your own to prevent diseases and keep yourself healthy. What should I know about diet, weight, and exercise? Eat a healthy diet  Eat a diet that includes plenty of vegetables, fruits, low-fat dairy products, and lean protein. Do not eat a lot of foods that are high in solid fats, added sugars, or sodium. Maintain a healthy weight Body mass index (BMI) is used to identify weight problems. It estimates body fat based on height and weight. Your health care provider can help determine your BMI and help you achieve or maintain a healthy weight. Get regular exercise Get regular exercise. This is one of the most important things you can do for your health. Most adults should: Exercise for at least 150 minutes each week. The exercise should increase your heart rate and make you sweat (moderate-intensity exercise). Do strengthening exercises at least twice a week. This is in addition to the moderate-intensity exercise. Spend less time sitting. Even light physical activity can be beneficial. Watch cholesterol and blood lipids Have your blood tested for lipids and cholesterol at 63 years of age, then have this test every 5 years. Have your cholesterol levels checked more often if: Your lipid or cholesterol levels are high. You are older than 63 years of age. You are at high risk for heart disease. What should I know about cancer screening? Depending on your health history and family history, you may need to have cancer screening at various ages. This may include screening for: Breast cancer. Cervical cancer. Colorectal cancer. Skin cancer. Lung cancer. What should I know about heart disease, diabetes, and high blood pressure? Blood pressure and heart  disease High blood pressure causes heart disease and increases the risk of stroke. This is more likely to develop in people who have high blood pressure readings, are of African descent, or are overweight. Have your blood pressure checked: Every 3-5 years if you are 18-39 years of age. Every year if you are 40 years old or older. Diabetes Have regular diabetes screenings. This checks your fasting blood sugar level. Have the screening done: Once every three years after age 40 if you are at a normal weight and have a low risk for diabetes. More often and at a younger age if you are overweight or have a high risk for diabetes. What should I know about preventing infection? Hepatitis B If you have a higher risk for hepatitis B, you should be screened for this virus. Talk with your health care provider to find out if you are at risk for hepatitis B infection. Hepatitis C Testing is recommended for: Everyone born from 1945 through 1965. Anyone with known risk factors for hepatitis C. Sexually transmitted infections (STIs) Get screened for STIs, including gonorrhea and chlamydia, if: You are sexually active and are younger than 63 years of age. You are older than 63 years of age and your health care provider tells you that you are at risk for this type of infection. Your sexual activity has changed since you were last screened, and you are at increased risk for chlamydia or gonorrhea. Ask your health care provider if you are at risk. Ask your health care provider about whether you are at high risk for HIV. Your health care provider may recommend a prescription medicine   to help prevent HIV infection. If you choose to take medicine to prevent HIV, you should first get tested for HIV. You should then be tested every 3 months for as long as you are taking the medicine. Pregnancy If you are about to stop having your period (premenopausal) and you may become pregnant, seek counseling before you get  pregnant. Take 400 to 800 micrograms (mcg) of folic acid every day if you become pregnant. Ask for birth control (contraception) if you want to prevent pregnancy. Osteoporosis and menopause Osteoporosis is a disease in which the bones lose minerals and strength with aging. This can result in bone fractures. If you are 65 years old or older, or if you are at risk for osteoporosis and fractures, ask your health care provider if you should: Be screened for bone loss. Take a calcium or vitamin D supplement to lower your risk of fractures. Be given hormone replacement therapy (HRT) to treat symptoms of menopause. Follow these instructions at home: Lifestyle Do not use any products that contain nicotine or tobacco, such as cigarettes, e-cigarettes, and chewing tobacco. If you need help quitting, ask your health care provider. Do not use street drugs. Do not share needles. Ask your health care provider for help if you need support or information about quitting drugs. Alcohol use Do not drink alcohol if: Your health care provider tells you not to drink. You are pregnant, may be pregnant, or are planning to become pregnant. If you drink alcohol: Limit how much you use to 0-1 drink a day. Limit intake if you are breastfeeding. Be aware of how much alcohol is in your drink. In the [place] one drink equals one 12 oz bottle of beer (355 mL), one 5 oz glass of wine (148 mL), or one 1 oz glass of hard liquor (44 mL). General instructions Schedule regular health, dental, and eye exams. Stay current with your vaccines. Tell your health care provider if: You often feel depressed. You have ever been abused or do not feel safe at home. Summary Adopting a healthy lifestyle and getting preventive care are important in promoting health and wellness. Follow your health care provider's instructions about healthy diet, exercising, and getting tested or screened for diseases. Follow your health care provider's  instructions on monitoring your cholesterol and blood pressure. This information is not intended to replace advice given to you by your health care provider. Make sure you discuss any questions you have with your health care provider. Document Revised: 09/23/2020 Document Reviewed: 07/09/2018 Elsevier Patient Education  2022 [employer]  

## 2021-03-31 NOTE — Progress Notes (Addendum)
Subjective:   Autumn Carson is a 65 y.o. female who presents for an Initial Medicare Annual Wellness Visit.  Review of Systems    Defer to PCP       Objective:    Today's Vitals   04/08/21 1413  PainSc: 9    There is no height or weight on file to calculate BMI.  Advanced Directives 02/10/2020 09/29/2019 12/09/2018 03/10/2018 04/23/2016 04/15/2016 11/22/2015  Does Patient Have a Medical Advance Directive? Yes No;Yes Yes No No No Yes  Type of Advance Directive Living will - Living will - - - [place];Living will  Does patient want to make changes to medical advance directive? No - Patient declined No - Patient declined No - Patient declined - - - -  Copy of Press photographer in Chart? - - - - - - No - copy requested  Would patient like information on creating a medical advance directive? - No - Patient declined - No - Patient declined No - patient declined information;Yes - Educational materials given No - patient declined information -  Pre-existing out of facility DNR order (yellow form or pink MOST form) - - - - - - -    Current Medications (verified) Outpatient Encounter Medications as of 04/08/2021  Medication Sig   amitriptyline (ELAVIL) 10 MG tablet TAKE 1 TABLET AT BEDTIME   amLODipine (NORVASC) 10 MG tablet TAKE 1 TABLET EVERY DAY   aspirin 81 MG tablet Take 81 mg by mouth every other day.   atorvastatin (LIPITOR) 40 MG tablet Take 1 tablet (40 mg total) by mouth daily.   Blood Glucose Monitoring Suppl (TRUE METRIX AIR GLUCOSE METER) w/Device KIT USE AS DIRECTED   celecoxib (CELEBREX) 100 MG capsule Take 1 capsule (100 mg total) by mouth 2 (two) times daily.   chlorthalidone (HYGROTON) 25 MG tablet TAKE 1 TABLET EVERY DAY   ciclopirox (PENLAC) 8 % solution Apply one coat to toenail qd for 48 weeks.  Remove weekly with polish remover.   cyclobenzaprine (FLEXERIL) 10 MG tablet 1 tablet as needed   diclofenac sodium (VOLTAREN) 1 % GEL  Apply dime size to knees three times a day as needed (Patient taking differently: Apply 1 application topically 3 (three) times daily as needed (pain). Apply dime size to knees three times a day as needed)   FLUoxetine (PROZAC) 40 MG capsule TAKE 1 CAPSULE EVERY DAY BEFORE BREAKFAST   gabapentin (NEURONTIN) 300 MG capsule TAKE 1 CAPSULE THREE TIMES DAILY  (DOSE  CHANGE)   hydrALAZINE (APRESOLINE) 10 MG tablet Take 1 tablet (10 mg total) by mouth 3 (three) times daily.   levothyroxine (SYNTHROID) 175 MCG tablet Take 175 mcg by mouth daily before breakfast.   metFORMIN (GLUCOPHAGE) 1000 MG tablet Take 1 tablet (1,000 mg total) by mouth 2 (two) times daily with a meal.   nebivolol (BYSTOLIC) 10 MG tablet TAKE 1 TABLET EVERY DAY   omeprazole (PRILOSEC) 40 MG capsule TAKE 1 CAPSULE TWICE DAILY  30  MINUTES  PRIOR  TO  MEALS   SHINGRIX injection    simvastatin (ZOCOR) 40 MG tablet    TRUE METRIX BLOOD GLUCOSE TEST test strip CHECK FINGER STICK BLOOD SUGAR TWICE DAILY AS DIRECTED   zolpidem (AMBIEN) 5 MG tablet TAKE 1 TABLET EVERY NIGHT AT BEDTIME AS NEEDED FOR SLEEP   No facility-administered encounter medications on file as of 04/08/2021.    Allergies (verified) Lisinopril and Other   History: Past Medical History:  Diagnosis  Date   Anxiety    Arthritis of both knees    Back pain    Cancer (HCC)    Thyroid   Chronic anemia    Depression 1998   Essential hypertension    GERD (gastroesophageal reflux disease)    History of thyroid cancer 2011   Papillary thyroid cancer s/p radiation   Hypothyroidism    Lumbar spinal stenosis    Obesity    PONV (postoperative nausea and vomiting)    Seasonal allergies    Type 2 diabetes mellitus with diabetic neuropathy ([place])    Past Surgical History:  Procedure Laterality Date   BACK SURGERY     BIOPSY N/A 01/16/2013   Procedure: BIOPSY;  Surgeon: Danie Binder, MD;  [facility];  Service: Endoscopy;  Laterality: N/A;  SMALL BOWEL  ULCERS   BRAVO [place] STUDY N/A 11/22/2015   Procedure: BRAVO [place];  Surgeon: Danie Binder, MD;  [facility];  Service: Endoscopy;  Laterality: N/A;   CARPAL TUNNEL RELEASE     4 total, bilaterally   CESAREAN SECTION     CHOLECYSTECTOMY  1990's   COLONOSCOPY WITH ESOPHAGOGASTRODUODENOSCOPY (EGD) N/A 12/08/2012   SLF: 1. Normal mucosa in the terminal ileum 2. mild diverticulosis throughout the entire examined colon. 3. rectal bleeding due to moderate sized internal hemorrhoids.    ELBOW SURGERY Bilateral    ENTEROSCOPY N/A 01/16/2013   Procedure: ENTEROSCOPY;  Surgeon: Danie Binder, MD;  [facility];  Service: Endoscopy;  Laterality: N/A;  2:00   ESOPHAGOGASTRODUODENOSCOPY N/A 11/22/2015   Procedure: ESOPHAGOGASTRODUODENOSCOPY (EGD);  Surgeon: Danie Binder, MD;  [facility];  Service: Endoscopy;  Laterality: N/A;  130 - moved to 2:00 - office to notify pt   GIVENS CAPSULE STUDY N/A 12/31/2012   Procedure: GIVENS CAPSULE STUDY;  Surgeon: Danie Binder, MD;  [facility];  Service: Endoscopy;  Laterality: N/A;  7:30   ROTATOR CUFF REPAIR Bilateral    bilaterally   SAVORY DILATION N/A 11/22/2015   Procedure: SAVORY DILATION;  Surgeon: Danie Binder, MD;  [facility];  Service: Endoscopy;  Laterality: N/A;   SPINE SURGERY     Lumbar Fusion- Dr. Trenton Gammon   TONSILECTOMY, ADENOIDECTOMY, BILATERAL [place]   TOTAL THYROIDECTOMY  2011   TUBAL LIGATION     Family History  Problem Relation Age of Onset   Heart disease Mother    Lung cancer Father        Age 64   Colon cancer Brother        Age 70   Heart attack Daughter 83   Hypertension Daughter    Other Brother        Archivist, age 15, cancer   Breast cancer Cousin    Social History   Socioeconomic History   Marital status: Divorced    Spouse name: Not on file   Number of children: 2   Years of education: Not on file   Highest  education level: Not on file  Occupational History   Occupation: Disabled    Employer: UNEMPLOYED  Tobacco Use   Smoking status: Never   Smokeless tobacco: Never  Vaping Use   Vaping Use: Never used  Substance and Sexual Activity   Alcohol use: Yes    Alcohol/week: 0.0 standard drinks    Comment: Occassional   Drug use: No   Sexual activity:  Not Currently    Birth control/protection: Surgical  Other Topics Concern   Not on file  Social History Narrative   Not on file   Social Determinants of Health   Financial Resource Strain: Not on file  Food Insecurity: Not on file  Transportation Needs: Not on file  Physical Activity: Not on file  Stress: Not on file  Social Connections: Not on file    Tobacco Counseling Counseling given: Not Answered   Clinical Intake:  Pre-visit preparation completed: Yes  Pain : 0-10 Pain Score: 9  Pain Type: Acute pain Pain Location: Knee Pain Onset: Today     Nutritional Risks: None Diabetes: Yes CBG done?: No Did pt. bring in CBG monitor from home?: No     Diabetic?Yes  Interpreter Needed?: No      Activities of Daily Living No flowsheet data found.  Patient Care Team: Gildardo Pounds, NP as PCP - General (Nurse Practitioner)  Indicate any recent Medical Services you may have received from other than [facility] providers in the past year (date may be approximate).     Assessment:   This is a routine wellness examination for Autumn Carson.  Hearing/Vision screen No results found.  Dietary issues and exercise activities discussed:     Goals Addressed             This Visit's Progress    Maintain Mobility and Function       Evidence-based guidance:  Emphasize the importance of physical activity and aerobic exercise as included in treatment plan; assess barriers to adherence; consider patient's abilities and preferences.  Encourage gradual increase in activity or exercise instead of stopping if pain occurs.   Reinforce individual therapy exercise prescription, such as strengthening, stabilization and stretching programs.  Promote optimal body mechanics to stabilize the spine with lifting and functional activity.  Encourage activity and mobility modifications to facilitate optimal function, such as using a log roll for bed mobility or dressing from a seated position.  Reinforce individual adaptive equipment recommendations to limit excessive spinal movements, such as a Systems analyst.  Assess adequacy of sleep; encourage use of sleep hygiene techniques, such as bedtime routine; use of [place] noise; dark, cool bedroom; avoiding daytime naps, heavy meals or exercise before bedtime.  Promote positions and modification to optimize sleep and sexual activity; consider pillows or positioning devices to assist in maintaining neutral spine.  Explore options for applying ergonomic principles at work and home, such as frequent position changes, using ergonomically designed equipment and working at optimal height.  Promote modifications to increase comfort with driving such as lumbar support, optimizing seat and steering wheel position, using cruise control and taking frequent rest stops to stretch and walk.   Notes:        Depression Screen PHQ 2/9 Scores 04/08/2021 02/07/2021 12/06/2020 06/06/2020 02/02/2020 09/29/2019 07/15/2018  PHQ - 2 Score 2 0 0 4 4 0 0  PHQ- 9 Score 2 2 0 16 8 0 -    Fall Risk Fall Risk  02/07/2021 12/06/2020 06/06/2020 02/02/2020 09/29/2019  Falls in the past year? 0 1 0 1 1  Number falls in past yr: 0 1 - 0 1  Injury with Fall? 0 0 - 0 0  Comment - - - - -  Risk for fall due to : - - Impaired balance/gait;Impaired mobility;History of fall(s) Impaired balance/gait;History of fall(s) History of fall(s)  Follow up - - Falls evaluation completed Falls evaluation completed Falls evaluation completed    FALL RISK  PREVENTION PERTAINING TO THE HOME:  Any stairs in or around the home? Yes  If  so, are there any without handrails? Yes  Home free of loose throw rugs in walkways, pet beds, electrical cords, etc? No  Adequate lighting in your home to reduce risk of falls? No   ASSISTIVE DEVICES UTILIZED TO PREVENT FALLS:  Life alert? No  Use of a cane, walker or w/c? Yes  Grab bars in the bathroom? No  Shower chair or bench in shower? No  Elevated toilet seat or a handicapped toilet? No   TIMED UP AND GO:  Was the test performed?  N/A .  Length of time to ambulate 10 feet: N/A sec.   *These questions cannot be answered via Telephone Encounter  Cognitive Function:        Immunizations Immunization History  Administered Date(s) Administered   Influenza Split 03/30/2014   Influenza,inj,Quad PF,6+ Mos 04/17/2016, 05/30/2017, 04/30/2019, 04/09/2020   Influenza,trivalent, recombinat, inj, PF 05/08/2015   Influenza-Unspecified 05/10/2018   Moderna Sars-Covid-2 Vaccination 09/29/2019, 10/27/2019, 05/31/2020   Pneumococcal Polysaccharide-23 05/13/2013   Zoster Recombinat (Shingrix) 08/08/2020, 10/06/2020    TDAP status: Due, Education has been provided regarding the importance of this vaccine. Advised may receive this vaccine at local pharmacy or Health Dept. Aware to provide a copy of the vaccination record if obtained from local pharmacy or Health Dept. Verbalized acceptance and understanding.  Flu Vaccine status: Due, Education has been provided regarding the importance of this vaccine. Advised may receive this vaccine at local pharmacy or Health Dept. Aware to provide a copy of the vaccination record if obtained from local pharmacy or Health Dept. Verbalized acceptance and understanding.  Pneumococcal vaccine status: Up to date  Covid-19 vaccine status: Information provided on how to obtain vaccines.   Qualifies for Shingles Vaccine? Yes   Zostavax completed No   Shingrix Completed?: Yes  Screening Tests Health Maintenance  Topic Date Due   TETANUS/TDAP  Never  done   PAP SMEAR-Modifier  03/04/2020   COVID-19 Vaccine (4 - Booster for Moderna series) 09/28/2020   INFLUENZA VACCINE  02/27/2021   MAMMOGRAM  07/27/2021   HEMOGLOBIN A1C  08/10/2021   OPHTHALMOLOGY EXAM  08/30/2021   FOOT EXAM  12/13/2021   COLONOSCOPY (Pts 45-18yr Insurance coverage will need to be confirmed)  12/09/2022   PNEUMOCOCCAL POLYSACCHARIDE VACCINE AGE 15-64 HIGH RISK  Completed   Hepatitis C Screening  Completed   HIV Screening  Completed   Zoster Vaccines- Shingrix  Completed   Pneumococcal Vaccine 054691Years old  Aged Out   [place]PV [place] Due  Topic Date Due   TETANUS/TDAP  Never done   PAP SMEAR-Modifier  03/04/2020   COVID-19 Vaccine (4 - Booster for Moderna series) 09/28/2020   INFLUENZA VACCINE  02/27/2021    Colorectal cancer screening: Type of screening: Colonoscopy. Completed 12/10/12. Repeat every 10 years  Mammogram status: Ordered 12/06/20. Pt provided with contact info and advised to call to schedule appt.  Pt scheduled for 05/01/21  Bone Density status: Ordered 04/08/21. Pt provided with contact info and advised to call to schedule appt.  Lung Cancer Screening: (Low Dose CT Chest recommended if Age 148-80years, 30 pack-year currently smoking OR have quit w/in 15years.) does not qualify.   Lung Cancer Screening Referral: n/a  Additional Screening:  Hepatitis C Screening: does qualify; Completed 03/04/2017  Vision Screening: Recommended annual ophthalmology exams for early detection of glaucoma and  other disorders of the eye. Is the patient up to date with their annual eye exam?  Yes  Who is the provider or what is the name of the office in which the patient attends annual eye exams? [facility] If pt is not established with a provider, would they like to be referred to a provider to establish care? No .   Dental Screening: Recommended annual dental exams for proper oral hygiene  Community  Resource Referral / Chronic Care Management: CRR required this visit?  No   CCM required this visit?  Yes      Plan:     I have personally reviewed and noted the following in the patient's chart:   Medical and social history Use of alcohol, tobacco or illicit drugs  Current medications and supplements including opioid prescriptions. Patient is not currently taking opioid prescriptions. Functional ability and status Nutritional status Physical activity Advanced directives List of other physicians Hospitalizations, surgeries, and ER visits in previous 12 months Vitals Screenings to include cognitive, depression, and falls Referrals and appointments  In addition, I have reviewed and discussed with patient certain preventive protocols, quality metrics, and best practice recommendations. A written personalized care plan for preventive services as well as general preventive health recommendations were provided to patient.     Octaviano Glow, CMA   04/08/2021   Nurse Notes: Non-Face to Face 20 minute visit Encounter   Ms. Farrar-Summers , Thank you for taking time to come for your Medicare Wellness Visit. I appreciate your ongoing commitment to your health goals. Please review the following plan we discussed and let me know if I can assist you in the future.   These are the goals we discussed:  Goals      Maintain Mobility and Function     Evidence-based guidance:  Emphasize the importance of physical activity and aerobic exercise as included in treatment plan; assess barriers to adherence; consider patient's abilities and preferences.  Encourage gradual increase in activity or exercise instead of stopping if pain occurs.  Reinforce individual therapy exercise prescription, such as strengthening, stabilization and stretching programs.  Promote optimal body mechanics to stabilize the spine with lifting and functional activity.  Encourage activity and mobility modifications to  facilitate optimal function, such as using a log roll for bed mobility or dressing from a seated position.  Reinforce individual adaptive equipment recommendations to limit excessive spinal movements, such as a Systems analyst.  Assess adequacy of sleep; encourage use of sleep hygiene techniques, such as bedtime routine; use of [place] Farrel noise; dark, cool bedroom; avoiding daytime naps, heavy meals or exercise before bedtime.  Promote positions and modification to optimize sleep and sexual activity; consider pillows or positioning devices to assist in maintaining neutral spine.  Explore options for applying ergonomic principles at work and home, such as frequent position changes, using ergonomically designed equipment and working at optimal height.  Promote modifications to increase comfort with driving such as lumbar support, optimizing seat and steering wheel position, using cruise control and taking frequent rest stops to stretch and walk.   Notes:      Pharmacy Care Plan:     CARE PLAN ENTRY  Current Barriers:  Chronic Disease Management support, education, and care coordination needs related to Hypertension, Hyperlipidemia, Diabetes, and Hypothyroidism   Hypertension Pharmacist Clinical Goal(s): Over the next 180 days, patient will work with PharmD and providers to maintain BP goal <130/80 Current regimen:  Bystolic 16XW Hydralazine 100m tid Amlodipine 162mInterventions:  Comprehensive medication review Continue current therapy and monitoring plan Patient self care activities - Over the next 180 days, patient will: Check BP when feeling symptomatic, document, and provide at future appointments Ensure daily salt intake < 2300 mg/day Contact providers with consistent blood pressure readings > 130/80.  Hyperlipidemia Pharmacist Clinical Goal(s): Over the next 120 days, patient will work with PharmD and providers to achieve LDL goal < 100 Current regimen:  Atorvastatin  63m Interventions: Comprehensive medication review Evaluated for myalgias Reviewed most recent lipid panel with patient Patient self care activities - Over the next 180 days, patient will: Continue to take medications as prescribed Contact PharmD or PCP with any medication related concerns  Diabetes Pharmacist Clinical Goal(s): Over the next 180 days, patient will work with PharmD and providers to maintain A1c goal <7% Current regimen:  Metformin 10064mtwice daily Interventions: Discussed diabetic diet and current exercise plan Comprehensive medication review Discussed limiting carbs and sugary beverages Patient self care activities - Over the next 120 days, patient will: Check blood sugar once or twice daily, document, and provide at future appointments Contact provider with any episodes of hypoglycemia or and consistent fasting blood glucose > 130 mg/dl  Hypothyroidism Pharmacist Clinical Goal(s) Over the next 120 days, patient will work with PharmD and providers to optimize medication and minimize symptoms related to hypothyroidism. Current regimen:  Levothyroxine 175 mcg Interventions: Comprehensive medication review Counseled on appropriate administration time  Will consult with PCP management of thyroid medication and next steps Patient self care activities - Over the next 120 days, patient will: Continue to take medication as directed Try to get in to see endocrinologist earlier than April    Please see past updates related to this goal by clicking on the "Past Updates" button in the selected goal          This is a list of the screening recommended for you and due dates:  Health Maintenance  Topic Date Due   Tetanus Vaccine  Never done   Pap Smear  03/04/2020   COVID-19 Vaccine (4 - Booster for Moderna series) 09/28/2020   Flu Shot  02/27/2021   Mammogram  07/27/2021   Hemoglobin A1C  08/10/2021   Eye exam for diabetics  08/30/2021   Complete foot exam    12/13/2021   Colon Cancer Screening  12/09/2022   Pneumococcal vaccine  Completed   Hepatitis C Screening: USPSTF Recommendation to screen - Ages 1863-79o.  Completed   HIV Screening  Completed   Zoster (Shingles) Vaccine  Completed   Pneumococcal Vaccination  Aged Out   HPV Vaccine  Aged Out

## 2021-04-08 ENCOUNTER — Ambulatory Visit (HOSPITAL_BASED_OUTPATIENT_CLINIC_OR_DEPARTMENT_OTHER): Payer: Medicare HMO

## 2021-04-08 DIAGNOSIS — Z0000 Encounter for general adult medical examination without abnormal findings: Secondary | ICD-10-CM

## 2021-04-12 ENCOUNTER — Other Ambulatory Visit: Payer: Self-pay | Admitting: Nurse Practitioner

## 2021-04-12 DIAGNOSIS — M810 Age-related osteoporosis without current pathological fracture: Secondary | ICD-10-CM

## 2021-04-26 IMAGING — MR MRI LUMBAR SPINE WITHOUT AND WITH CONTRAST
4 of 7 series · 25 of 48 positions shown · IV contrast (20 ml Multihance)
Comparison: 10/15/2017

CLINICAL DATA: Severe left-sided back pain, leg pain

EXAM:
MRI LUMBAR SPINE WITHOUT AND WITH CONTRAST
TECHNIQUE: Multiplanar and multiecho pulse sequences of the lumbar spine were
obtained without and with intravenous contrast.
CONTRAST:  20mL MULTIHANCE GADOBENATE DIMEGLUMINE 529 MG/ML IV SOLN

[Series 3: T1 · sagittal · 4.0mm · 0.55mm/px · 5 of 15 slices shown (1 of 2)]
[im 1/15]
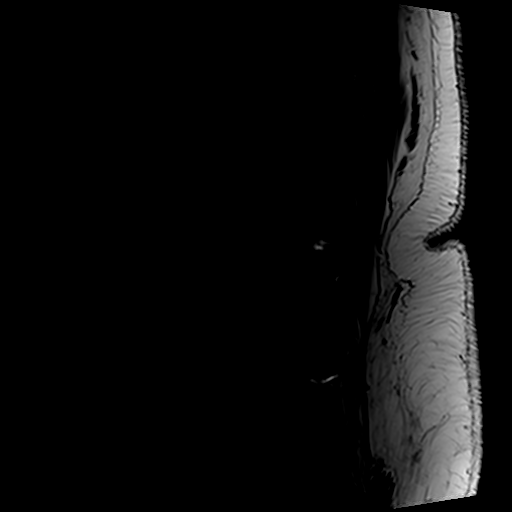
[im 4/15]
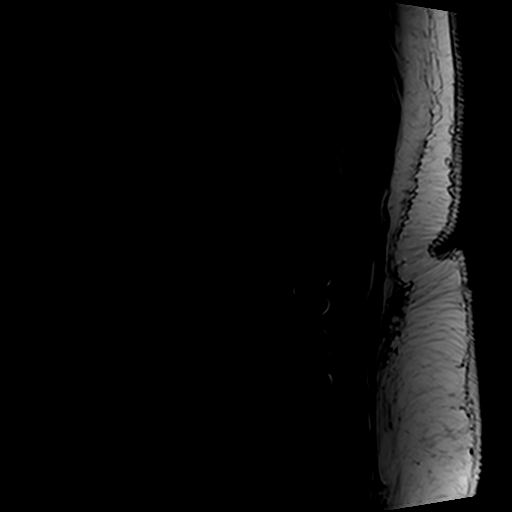
[im 8/15]
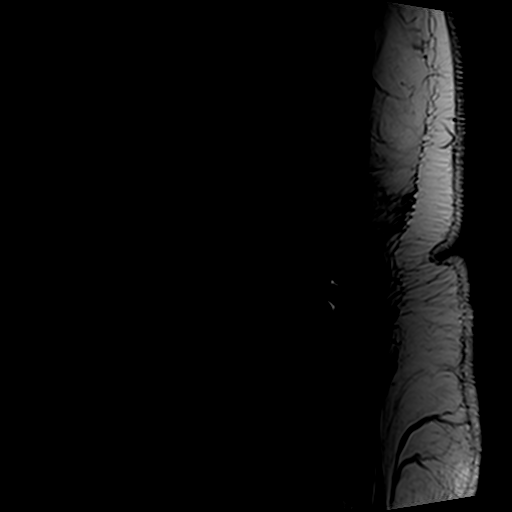
[im 11/15]
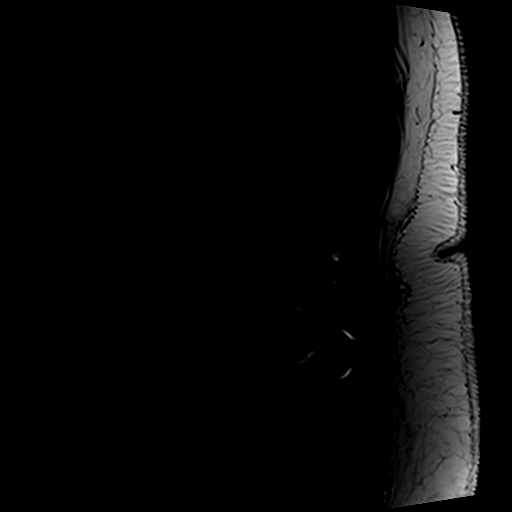
[im 15/15]
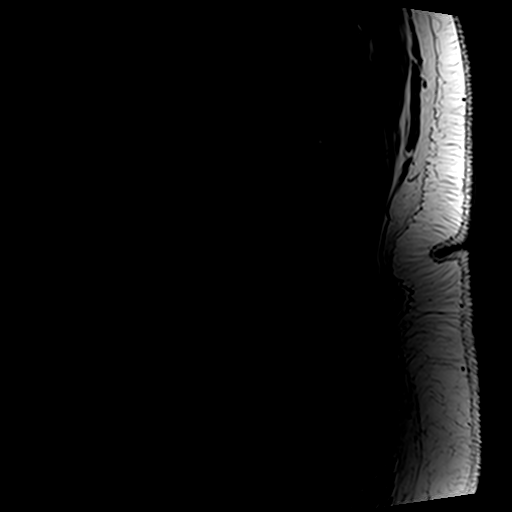

[Series 5: T2 · axial · 4.0mm · 0.70mm/px · z∈[-102,+110]mm · 8 of 37 slices shown (1 of 2)]
[im 1/37]
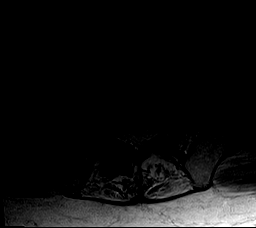
[im 5/37]
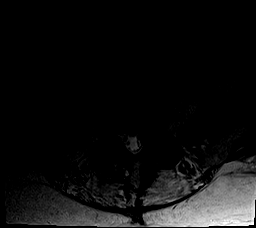
[im 13/37]
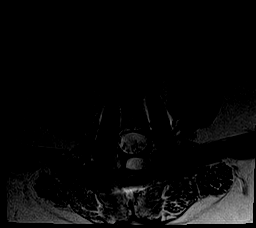
[im 17/37]
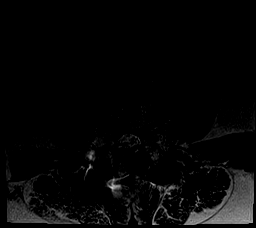
[im 21/37]
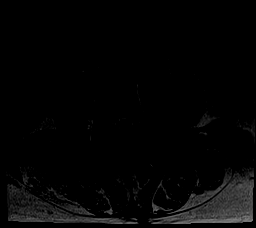
[im 25/37]
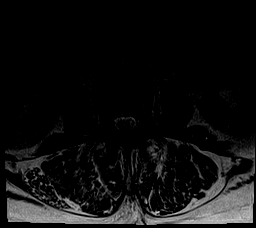
[im 33/37]
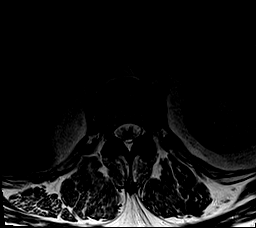
[im 37/37]
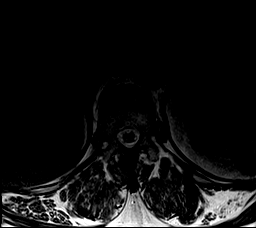

[Series 6: T1 · axial · 4.0mm · 0.35mm/px · z∈[-102,+110]mm · 8 of 37 slices shown (2 of 2)]
[im 1/37]
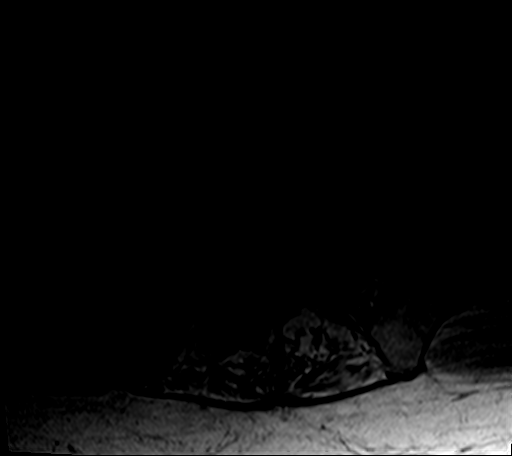
[im 5/37]
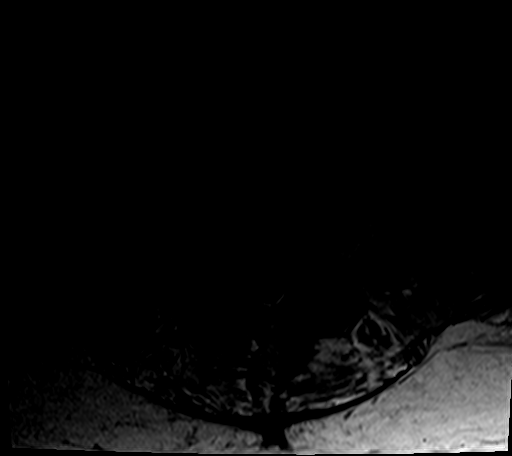
[im 13/37]
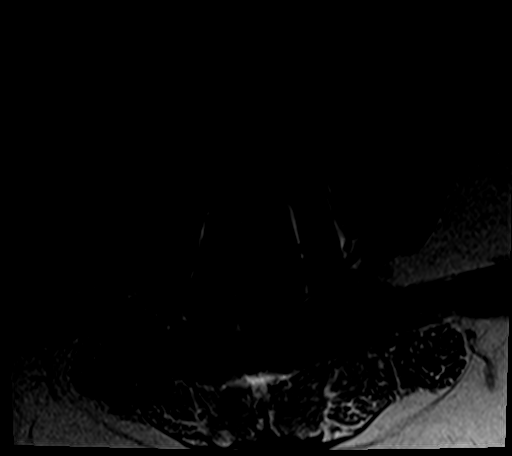
[im 17/37]
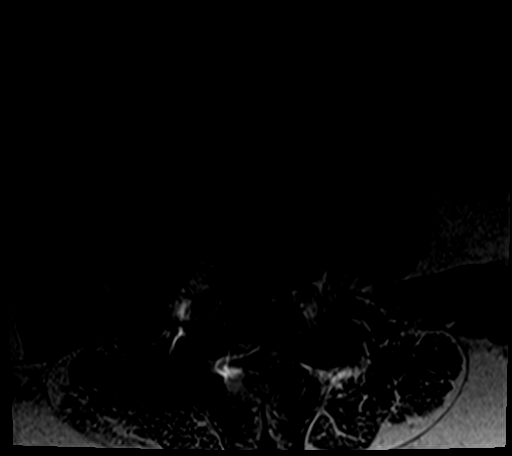
[im 21/37]
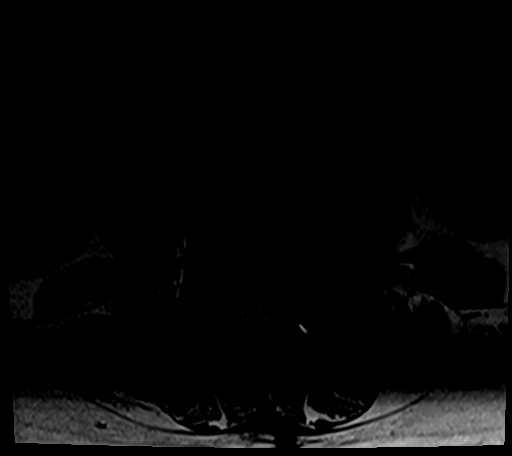
[im 25/37]
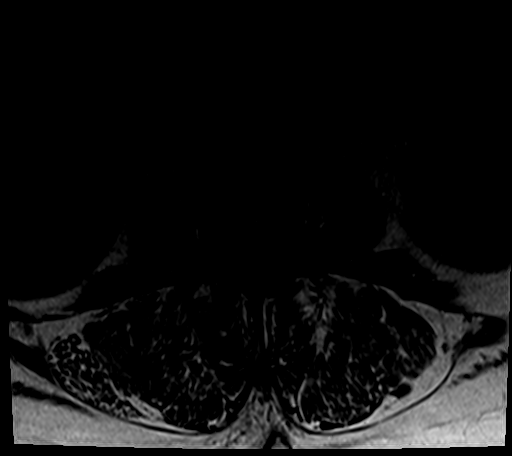
[im 33/37]
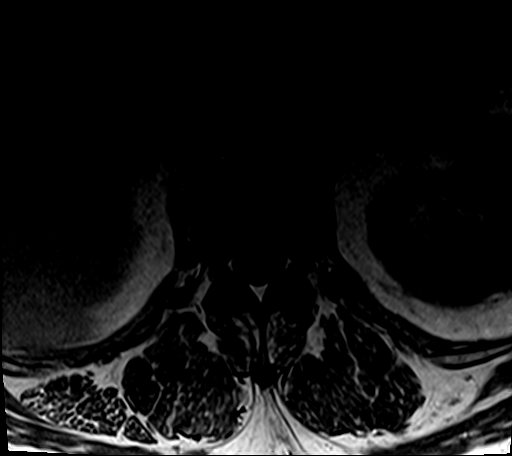
[im 37/37]
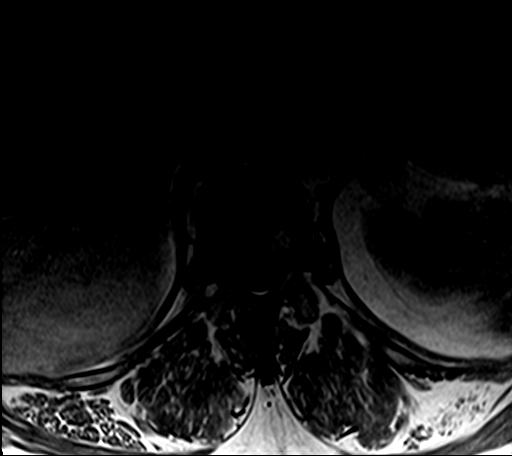

[Series 7: T2 · sagittal · 4.0mm · 0.55mm/px · 4 of 15 slices shown (2 of 2)]
[im 1/15]
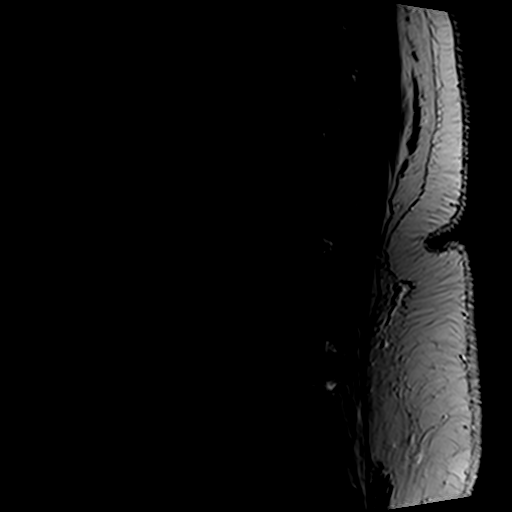
[im 5/15]
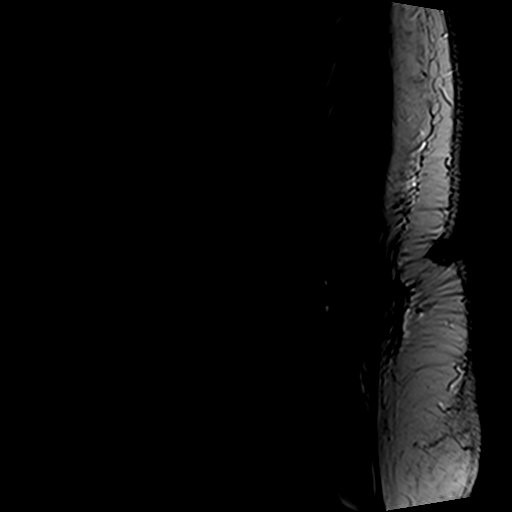
[im 10/15]
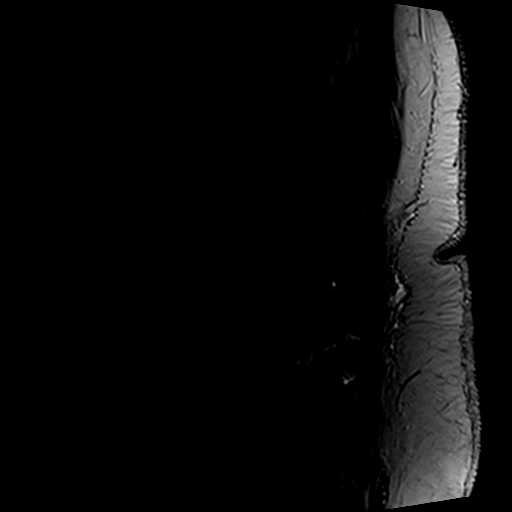
[im 15/15]
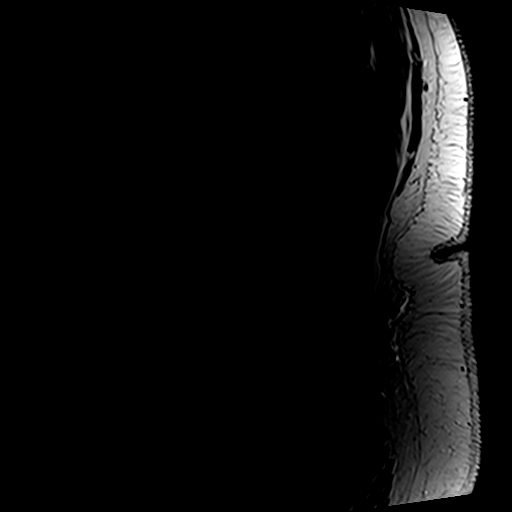

[25 of 48 positions shown; findings below may reference images not displayed]

FINDINGS: Segmentation:  Standard.

Alignment:  2 mm retrolisthesis of L2 on L3.

Vertebrae:  No fracture, evidence of discitis, or bone lesion.

Conus medullaris and cauda equina: Conus extends to the T12-L1
level. Conus and cauda equina appear normal.

Paraspinal and other soft tissues: Postsurgical changes in the
posterior paraspinal soft tissues from L3 through L5.

Disc levels:

Disc spaces: Posterior lumbar fusion from L3 through L5.
Degenerative disc disease with disc height loss

T12-L1: No significant disc bulge. No evidence of neural foraminal
stenosis. No central canal stenosis. Mild bilateral facet
arthropathy.

L1-L2: No significant disc bulge. No evidence of neural foraminal
stenosis. No central canal stenosis. Moderate bilateral facet
arthropathy.

L2-L3: Broad-based disc bulge. Severe bilateral facet arthropathy.
Severe spinal stenosis. Bilateral foraminal stenosis.

L3-L4: Interbody fusion. No evidence of neural foraminal stenosis.
No central canal stenosis.

L4-L5: Interbody fusion. No evidence of neural foraminal stenosis.
No central canal stenosis.

L5-S1: No significant disc bulge. Moderate bilateral facet
arthropathy. Moderate left foraminal stenosis. Mild right foraminal
stenosis. No central canal stenosis.
IMPRESSION: 1. Posterior lumbar interbody fusion from L3 through L5 without
foraminal or central canal stenosis.
2. At L2-3 there is a broad-based disc bulge. Severe bilateral facet
arthropathy. Severe spinal stenosis. Bilateral foraminal stenosis.

## 2021-05-01 ENCOUNTER — Ambulatory Visit: Payer: Medicare HMO | Attending: Nurse Practitioner | Admitting: Nurse Practitioner

## 2021-05-01 ENCOUNTER — Ambulatory Visit
Admission: RE | Admit: 2021-05-01 | Discharge: 2021-05-01 | Disposition: A | Discharge status: Home / Self Care | Payer: Medicare HMO | Source: Ambulatory Visit | Attending: Nurse Practitioner | Admitting: Nurse Practitioner

## 2021-05-01 ENCOUNTER — Encounter: Payer: Self-pay | Admitting: Nurse Practitioner

## 2021-05-01 ENCOUNTER — Other Ambulatory Visit: Payer: Self-pay

## 2021-05-01 VITALS — BP 161/94 | HR 96 | Ht 67.0 in | Wt 328.5 lb

## 2021-05-01 DIAGNOSIS — M1711 Unilateral primary osteoarthritis, right knee: Secondary | ICD-10-CM

## 2021-05-01 DIAGNOSIS — Z23 Encounter for immunization: Secondary | ICD-10-CM | POA: Diagnosis not present

## 2021-05-01 DIAGNOSIS — Z1231 Encounter for screening mammogram for malignant neoplasm of breast: Secondary | ICD-10-CM | POA: Diagnosis not present

## 2021-05-01 DIAGNOSIS — I10 Essential (primary) hypertension: Secondary | ICD-10-CM | POA: Diagnosis not present

## 2021-05-01 DIAGNOSIS — C73 Malignant neoplasm of thyroid gland: Secondary | ICD-10-CM

## 2021-05-01 DIAGNOSIS — E1165 Type 2 diabetes mellitus with hyperglycemia: Secondary | ICD-10-CM | POA: Diagnosis not present

## 2021-05-01 NOTE — Progress Notes (Addendum)
Assessment & Plan:  Autumn Carson was seen today for hypertension.  Diagnoses and all orders for this visit:  Essential hypertension Continue all antihypertensives as prescribed.  Remember to bring in your blood pressure log with you for your follow up appointment.  DASH/Mediterranean Diets are healthier choices for HTN.    Primary osteoarthritis of right knee -     Ambulatory referral to Orthopedic Surgery Work on losing weight to help reduce joint pain. May alternate with heat and ice application for pain relief. May also alternate with acetaminophen as prescribed pain relief. Other alternatives include massage, acupuncture and water aerobics.  You must stay active and avoid a sedentary lifestyle.   Need for diphtheria-tetanus-pertussis (Tdap) vaccine -     Tdap vaccine greater than or equal to 7yo IM  Malignant neoplasm of thyroid gland (HCC) Sees Dr. Buddy Duty endocrinology   Class 3 obesity ([place]) Discussed diet and exercise for person with BMI >51. Instructed: You must burn more calories than you eat. Losing 5 percent of your body weight should be considered a success. In the longer term, losing more than 15 percent of your body weight and staying at this weight is an extremely good result. However, keep in mind that even losing 5 percent of your body weight leads to important health benefits, so try not to get discouraged if you're not able to lose more than this.   Type 2 diabetes mellitus with hyperglycemia, without long-term current use of insulin ([place]) -     Ambulatory referral to Ophthalmology Continue blood sugar control as discussed in office today, low carbohydrate diet, and regular physical exercise as tolerated, 150 minutes per week (30 min each day, 5 days per week, or 50 min 3 days per week). Keep blood sugar logs with fasting goal of 90-130 mg/dl, post prandial (after you eat) less than 180.  For Hypoglycemia: BS <60 and Hyperglycemia BS >400; contact the clinic ASAP. Annual  eye exams and foot exams are recommended.    Patient has been counseled on age-appropriate routine health concerns for screening and prevention. These are reviewed and up-to-date. Referrals have been placed accordingly. Immunizations are up-to-date or declined.    Subjective:   Chief Complaint  Patient presents with   Hypertension    HPI Autumn Carson 65 y.o. female presents to office today  She  has a past medical history of Anxiety, Arthritis of both knees, Back pain, Chronic anemia, Depression (1998), Essential hypertension, GERD, History of thyroid cancer (2011), Hypothyroidism, Lumbar spinal stenosis, Obesity, Seasonal allergies, and Type 2 diabetes mellitus with diabetic neuropathy.   She sees Dr. Buddy Duty for her thyroid disease.   She is going to call [name] as she is starting to experience right sided sciatica. States Pain is terrible. Right knee worse than left . Now right side going numb from hip down to keen. Can not sleep on her back. Has to prop pillows in between legs. S/P lumbar interbody fusion 02-12-2020/  R KNEE OA Severe OA in left and right however  pain R>L. Uses cane to assist with mobility. Requests referral back to [place] however she wishes to see another orthopedic specialist aside from Dr. Gladstone Lighter. States all he wanted to do was put injections in my knee. Celebrex ineffective.     HTN She states she ate too many wings this weekend celerating her brothers birthday. Endorses adherence taking chlorthalidone 25 mg   daily, amlodipine 10 mg daily, hydralazine 10 mg TID and bystolic 10 mg  daily.  BP Readings from Last 3 Encounters:  05/01/21 (!) 161/94  02/07/21 (!) 145/68  06/06/20 138/74     Review of Systems  Constitutional:  Negative for fever, malaise/fatigue and weight loss.  HENT: Negative.  Negative for nosebleeds.   Eyes: Negative.  Negative for blurred vision, double vision and photophobia.  Respiratory: Negative.  Negative for cough and  shortness of breath.   Cardiovascular: Negative.  Negative for chest pain, palpitations and leg swelling.  Gastrointestinal: Negative.  Negative for heartburn, nausea and vomiting.  Musculoskeletal:  Positive for back pain. Negative for myalgias.  Neurological:  Positive for sensory change. Negative for dizziness, focal weakness, seizures and headaches.  Psychiatric/Behavioral: Negative.  Negative for suicidal ideas.    Past Medical History:  Diagnosis Date   Anxiety    Arthritis of both knees    Back pain    Cancer (HCC)    Thyroid   Chronic anemia    Depression 1998   Essential hypertension    GERD (gastroesophageal reflux disease)    History of thyroid cancer 2011   Papillary thyroid cancer s/p radiation   Hypothyroidism    Lumbar spinal stenosis    Obesity    PONV (postoperative nausea and vomiting)    Seasonal allergies    Type 2 diabetes mellitus with diabetic neuropathy ([place])     Past Surgical History:  Procedure Laterality Date   BACK SURGERY     BIOPSY N/A 01/16/2013   Procedure: BIOPSY;  Surgeon: Danie Binder, MD;  [facility];  Service: Endoscopy;  Laterality: N/A;  SMALL BOWEL ULCERS   BRAVO [place] STUDY N/A 11/22/2015   Procedure: BRAVO [place];  Surgeon: Danie Binder, MD;  [facility];  Service: Endoscopy;  Laterality: N/A;   CARPAL TUNNEL RELEASE     4 total, bilaterally   CESAREAN SECTION     CHOLECYSTECTOMY  1990's   COLONOSCOPY WITH ESOPHAGOGASTRODUODENOSCOPY (EGD) N/A 12/08/2012   SLF: 1. Normal mucosa in the terminal ileum 2. mild diverticulosis throughout the entire examined colon. 3. rectal bleeding due to moderate sized internal hemorrhoids.    ELBOW SURGERY Bilateral    ENTEROSCOPY N/A 01/16/2013   Procedure: ENTEROSCOPY;  Surgeon: Danie Binder, MD;  [facility];  Service: Endoscopy;  Laterality: N/A;  2:00   ESOPHAGOGASTRODUODENOSCOPY N/A 11/22/2015   Procedure: ESOPHAGOGASTRODUODENOSCOPY (EGD);  Surgeon: Danie Binder, MD;  [facility];  Service: Endoscopy;  Laterality: N/A;  130 - moved to 2:00 - office to notify pt   GIVENS CAPSULE STUDY N/A 12/31/2012   Procedure: GIVENS CAPSULE STUDY;  Surgeon: Danie Binder, MD;  [facility];  Service: Endoscopy;  Laterality: N/A;  7:30   ROTATOR CUFF REPAIR Bilateral    bilaterally   SAVORY DILATION N/A 11/22/2015   Procedure: SAVORY DILATION;  Surgeon: Danie Binder, MD;  [facility];  Service: Endoscopy;  Laterality: N/A;   SPINE SURGERY     Lumbar Fusion- Dr. Trenton Gammon   TONSILECTOMY, ADENOIDECTOMY, BILATERAL [place]   TOTAL THYROIDECTOMY  2011   TUBAL LIGATION      Family History  Problem Relation Age of Onset   Heart disease Mother    Lung cancer Father        Age 23   Colon cancer Brother        Age 59   Heart attack Daughter 26  Hypertension Daughter    Other Brother        Agent orange, age 71, cancer   Breast cancer Cousin     Social History Reviewed with no changes to be made today.   Outpatient Medications Prior to Visit  Medication Sig Dispense Refill   amitriptyline (ELAVIL) 10 MG tablet TAKE 1 TABLET AT BEDTIME 90 tablet 0   amLODipine (NORVASC) 10 MG tablet TAKE 1 TABLET EVERY DAY 90 tablet 3   aspirin 81 MG tablet Take 81 mg by mouth every other day.     atorvastatin (LIPITOR) 40 MG tablet Take 1 tablet (40 mg total) by mouth daily. 90 tablet 3   Blood Glucose Monitoring Suppl (TRUE METRIX AIR GLUCOSE METER) w/Device KIT USE AS DIRECTED 1 kit 0   celecoxib (CELEBREX) 100 MG capsule Take 1 capsule (100 mg total) by mouth 2 (two) times daily. 180 capsule 1   chlorthalidone (HYGROTON) 25 MG tablet TAKE 1 TABLET EVERY DAY 90 tablet 0   ciclopirox (PENLAC) 8 % solution Apply one coat to toenail qd for 48 weeks.  Remove weekly with polish remover. 6.6 mL 11   cyclobenzaprine (FLEXERIL) 10 MG tablet 1 tablet as needed     diclofenac sodium (VOLTAREN) 1 % GEL  Apply dime size to knees three times a day as needed (Patient taking differently: Apply 1 application topically 3 (three) times daily as needed (pain). Apply dime size to knees three times a day as needed) 100 g 3   FLUoxetine (PROZAC) 40 MG capsule TAKE 1 CAPSULE EVERY DAY BEFORE BREAKFAST 90 capsule 1   gabapentin (NEURONTIN) 300 MG capsule TAKE 1 CAPSULE THREE TIMES DAILY  (DOSE  CHANGE) 270 capsule 1   hydrALAZINE (APRESOLINE) 10 MG tablet Take 1 tablet (10 mg total) by mouth 3 (three) times daily. 270 tablet 3   levothyroxine (SYNTHROID) 175 MCG tablet Take 175 mcg by mouth daily before breakfast.     metFORMIN (GLUCOPHAGE) 1000 MG tablet Take 1 tablet (1,000 mg total) by mouth 2 (two) times daily with a meal. 180 tablet 1   nebivolol (BYSTOLIC) 10 MG tablet TAKE 1 TABLET EVERY DAY 90 tablet 0   omeprazole (PRILOSEC) 40 MG capsule TAKE 1 CAPSULE TWICE DAILY  30  MINUTES  PRIOR  TO  MEALS 180 capsule 3   SHINGRIX injection      simvastatin (ZOCOR) 40 MG tablet      TRUE METRIX BLOOD GLUCOSE TEST test strip CHECK FINGER STICK BLOOD SUGAR TWICE DAILY AS DIRECTED 200 strip 3   zolpidem (AMBIEN) 5 MG tablet TAKE 1 TABLET EVERY NIGHT AT BEDTIME AS NEEDED FOR SLEEP 30 tablet 3   No facility-administered medications prior to visit.    Allergies  Allergen Reactions   Lisinopril Anaphylaxis    angioedema   Other     Other reaction(s): throat swelling       Objective:    BP (!) 161/94   Pulse 96   Ht _0  (1.702 m)   Wt (!) 328 lb 8 oz (149 kg)   LMP 09/07/2011   SpO2 100%   BMI 51.45 kg/m  Wt Readings from Last 3 Encounters:  05/01/21 (!) 328 lb 8 oz (149 kg)  02/07/21 (!) 327 lb (148.3 kg)  06/06/20 (!) 327 lb (148.3 kg)    Physical Exam Vitals and nursing note reviewed.  Constitutional:      Appearance: She is well-developed.  HENT:     Head: Normocephalic and atraumatic.  Cardiovascular:     Rate and Rhythm: Normal rate and regular rhythm.     Heart sounds: Normal  heart sounds. No murmur heard.   No friction rub. No gallop.  Pulmonary:     Effort: Pulmonary effort is normal. No tachypnea or respiratory distress.     Breath sounds: Normal breath sounds. No decreased breath sounds, wheezing, rhonchi or rales.  Chest:     Chest wall: No tenderness.  Abdominal:     General: Bowel sounds are normal.     Palpations: Abdomen is soft.  Musculoskeletal:        General: Normal range of motion.     Cervical back: Normal range of motion.     Right knee: Swelling present.     Left knee: Swelling present.     Comments: Using cane for mobility  Skin:    General: Skin is warm and dry.  Neurological:     Mental Status: She is alert and oriented to person, place, and time.     Coordination: Coordination normal.  Psychiatric:        Behavior: Behavior normal. Behavior is cooperative.        Thought Content: Thought content normal.        Judgment: Judgment normal.         Patient has been counseled extensively about nutrition and exercise as well as the importance of adherence with medications and regular follow-up. The patient was given clear instructions to go to ER or return to medical center if symptoms don't improve, worsen or new problems develop. The patient verbalized understanding.   Follow-up: Return for 2 week tele any tuesday/BP readings. THEN PAP SMEAR in december .   Gildardo Pounds, FNP-BC [facility] [place], [place]   05/01/2021, 4:48 PM

## 2021-05-15 ENCOUNTER — Encounter: Payer: Self-pay | Admitting: Nurse Practitioner

## 2021-05-16 ENCOUNTER — Encounter: Payer: Self-pay | Admitting: Nurse Practitioner

## 2021-05-16 ENCOUNTER — Telehealth (HOSPITAL_BASED_OUTPATIENT_CLINIC_OR_DEPARTMENT_OTHER): Payer: Medicare HMO | Admitting: Nurse Practitioner

## 2021-05-16 DIAGNOSIS — I10 Essential (primary) hypertension: Secondary | ICD-10-CM

## 2021-05-16 DIAGNOSIS — M5136 Other intervertebral disc degeneration, lumbar region: Secondary | ICD-10-CM | POA: Diagnosis not present

## 2021-05-16 DIAGNOSIS — M51369 Other intervertebral disc degeneration, lumbar region without mention of lumbar back pain or lower extremity pain: Secondary | ICD-10-CM

## 2021-05-16 DIAGNOSIS — M170 Bilateral primary osteoarthritis of knee: Secondary | ICD-10-CM

## 2021-05-16 MED ORDER — CELECOXIB 100 MG PO CAPS: 100.0000 mg | ORAL_CAPSULE | Freq: Two times a day (BID) | ORAL | 1 refills | Status: DC | PRN

## 2021-05-16 NOTE — Progress Notes (Signed)
Virtual Visit via Telephone Note Due to national recommendations of social distancing due to [place] 19, telehealth visit is felt to be most appropriate for this patient at this time.  I discussed the limitations, risks, security and privacy concerns of performing an evaluation and management service by telephone and the availability of in person appointments. I also discussed with the patient that there may be a patient responsible charge related to this service. The patient expressed understanding and agreed to proceed.    I connected with Autumn Carson on 05/16/21  at   8:30 AM EDT  EDT by telephone and verified that I am speaking with the correct person using two identifiers.  Location of Patient: Private Residence   Location of Provider: [place] and [place] participating in Telemedicine visit: [name] FNP-BC [name]    History of Present Illness: Telemedicine visit for: BP check She  has a past medical history of Anxiety, Arthritis of both knees, Back pain, Chronic anemia, Depression (1998), Essential hypertension, GERD, History of thyroid cancer (2011), Hypothyroidism, Lumbar spinal stenosis, Obesity, Seasonal allergies, and Type 2 diabetes mellitus with diabetic neuropathy.    She sees Dr. Buddy Duty for her thyroid disease.    She is going to call [name] as she is starting to experience right sided sciatica. States Pain is terrible. Right knee worse than left . Now right side going numb from hip down to keen. Can not sleep on her back. Has to prop pillows in between legs. S/P lumbar interbody fusion 02-12-2020  HTN Although blood pressure readings are elevated in the office her home readings based on meter readings today are 130/80. At this time she will continue on amlodipine 10 mg daily, chlorthalidone 25 mg daily, Bystolic 10 mg daily and hydralazine 10 mg 3 times daily.   COVID  She had a positive home covid test  last friday and symptoms at that time included scratchy throat, cough and nasal congestion.  Currently all symptoms have resolved at this time.   Past Medical History:  Diagnosis Date   Anxiety    Arthritis of both knees    Back pain    Cancer (HCC)    Thyroid   Chronic anemia    Depression 1998   Essential hypertension    GERD (gastroesophageal reflux disease)    History of thyroid cancer 2011   Papillary thyroid cancer s/p radiation   Hypothyroidism    Lumbar spinal stenosis    Obesity    PONV (postoperative nausea and vomiting)    Seasonal allergies    Type 2 diabetes mellitus with diabetic neuropathy ([place])     Past Surgical History:  Procedure Laterality Date   BACK SURGERY     BIOPSY N/A 01/16/2013   Procedure: BIOPSY;  Surgeon: Danie Binder, MD;  [facility];  Service: Endoscopy;  Laterality: N/A;  SMALL BOWEL ULCERS   BRAVO [place] STUDY N/A 11/22/2015   Procedure: BRAVO [place];  Surgeon: Danie Binder, MD;  [facility];  Service: Endoscopy;  Laterality: N/A;   CARPAL TUNNEL RELEASE     4 total, bilaterally   CESAREAN SECTION     CHOLECYSTECTOMY  1990's   COLONOSCOPY WITH ESOPHAGOGASTRODUODENOSCOPY (EGD) N/A 12/08/2012   SLF: 1. Normal mucosa in the terminal ileum 2. mild diverticulosis throughout the entire examined colon. 3. rectal bleeding due to moderate sized internal hemorrhoids.    ELBOW SURGERY Bilateral    ENTEROSCOPY  N/A 01/16/2013   Procedure: ENTEROSCOPY;  Surgeon: Danie Binder, MD;  [facility];  Service: Endoscopy;  Laterality: N/A;  2:00   ESOPHAGOGASTRODUODENOSCOPY N/A 11/22/2015   Procedure: ESOPHAGOGASTRODUODENOSCOPY (EGD);  Surgeon: Danie Binder, MD;  [facility];  Service: Endoscopy;  Laterality: N/A;  130 - moved to 2:00 - office to notify pt   GIVENS CAPSULE STUDY N/A 12/31/2012   Procedure: GIVENS CAPSULE STUDY;  Surgeon: Danie Binder, MD;  [facility];  Service: Endoscopy;  Laterality:  N/A;  7:30   ROTATOR CUFF REPAIR Bilateral    bilaterally   SAVORY DILATION N/A 11/22/2015   Procedure: SAVORY DILATION;  Surgeon: Danie Binder, MD;  [facility];  Service: Endoscopy;  Laterality: N/A;   SPINE SURGERY     Lumbar Fusion- Dr. Trenton Gammon   TONSILECTOMY, ADENOIDECTOMY, BILATERAL [place]   TOTAL THYROIDECTOMY  2011   TUBAL LIGATION      Family History  Problem Relation Age of Onset   Heart disease Mother    Lung cancer Father        Age 41   Colon cancer Brother        Age 17   Heart attack Daughter 76   Hypertension Daughter    Other Brother        Archivist, age 11, cancer   Breast cancer Cousin     Social History   Socioeconomic History   Marital status: Divorced    Spouse name: Not on file   Number of children: 2   Years of education: Not on file   Highest education level: Not on file  Occupational History   Occupation: Disabled    Employer: UNEMPLOYED  Tobacco Use   Smoking status: Never   Smokeless tobacco: Never  Vaping Use   Vaping Use: Never used  Substance and Sexual Activity   Alcohol use: Yes    Alcohol/week: 0.0 standard drinks    Comment: Occassional   Drug use: No   Sexual activity: Not Currently    Birth control/protection: Surgical  Other Topics Concern   Not on file  Social History Narrative   Not on file   Social Determinants of Health   Financial Resource Strain: Not on file  Food Insecurity: Not on file  Transportation Needs: Not on file  Physical Activity: Not on file  Stress: Not on file  Social Connections: Not on file     Observations/Objective: Awake, alert and oriented x 3   Review of Systems  Constitutional:  Negative for fever, malaise/fatigue and weight loss.  HENT: Negative.  Negative for nosebleeds.   Eyes: Negative.  Negative for blurred vision, double vision and photophobia.  Respiratory: Negative.  Negative for cough and shortness of breath.    Cardiovascular: Negative.  Negative for chest pain, palpitations and leg swelling.  Gastrointestinal: Negative.  Negative for heartburn, nausea and vomiting.  Musculoskeletal:  Positive for back pain, joint pain and myalgias.  Neurological: Negative.  Negative for dizziness, focal weakness, seizures and headaches.  Psychiatric/Behavioral: Negative.  Negative for suicidal ideas.    Assessment and Plan: Diagnoses and all orders for this visit:  Essential hypertension -     CMP14+EGFR; Future Continue all antihypertensives as prescribed.  Remember to bring in your blood pressure log with you for your follow up appointment.  DASH/Mediterranean Diets are healthier choices for HTN.    DDD (degenerative disc disease),  lumbar -     celecoxib (CELEBREX) 100 MG capsule; Take 1 capsule (100 mg total) by mouth 2 (two) times daily as needed for moderate pain.  Primary osteoarthritis of both knees -     celecoxib (CELEBREX) 100 MG capsule; Take 1 capsule (100 mg total) by mouth 2 (two) times daily as needed for moderate pain. Work on losing weight to help reduce joint pain. May alternate with heat and ice application for pain relief. May also alternate with acetaminophen  as prescribed pain relief. Other alternatives include massage, acupuncture and water aerobics.  You must stay active and avoid a sedentary lifestyle.     Follow Up Instructions Return for She has appointment scheduled with me in November.     I discussed the assessment and treatment plan with the patient. The patient was provided an opportunity to ask questions and all were answered. The patient agreed with the plan and demonstrated an understanding of the instructions.   The patient was advised to call back or seek an in-person evaluation if the symptoms worsen or if the condition fails to improve as anticipated.  I provided 10 minutes of non-face-to-face time during this encounter including median intraservice time, reviewing  previous notes, labs, imaging, medications and explaining diagnosis and management.  Gildardo Pounds, FNP-BC

## 2021-05-22 ENCOUNTER — Other Ambulatory Visit: Payer: Self-pay

## 2021-05-22 ENCOUNTER — Ambulatory Visit: Payer: Medicare HMO | Attending: Nurse Practitioner

## 2021-05-22 DIAGNOSIS — I10 Essential (primary) hypertension: Secondary | ICD-10-CM | POA: Diagnosis not present

## 2021-05-23 LAB — CMP14+EGFR
ALT: 7 IU/L (ref 0–32)
AST: 18 IU/L (ref 0–40)
Albumin/Globulin Ratio: 1.4 (ref 1.2–2.2)
Albumin: 4.2 g/dL (ref 3.8–4.8)
Alkaline Phosphatase: 80 IU/L (ref 44–121)
BUN/Creatinine Ratio: 18 (ref 12–28)
BUN: 13 mg/dL (ref 8–27)
Bilirubin Total: 0.4 mg/dL (ref 0.0–1.2)
CO2: 24 mmol/L (ref 20–29)
Calcium: 8.7 mg/dL (ref 8.7–10.3)
Chloride: 105 mmol/L (ref 96–106)
Creatinine, Ser: 0.71 mg/dL (ref 0.57–1.00)
Globulin, Total: 3.1 g/dL (ref 1.5–4.5)
Glucose: 105 mg/dL — ABNORMAL HIGH (ref 70–99)
Potassium: 4.4 mmol/L (ref 3.5–5.2)
Sodium: 140 mmol/L (ref 134–144)
Total Protein: 7.3 g/dL (ref 6.0–8.5)
eGFR: 95 mL/min/{1.73_m2} (ref >59)

## 2021-05-29 ENCOUNTER — Ambulatory Visit: Payer: Medicare HMO | Admitting: Neurology

## 2021-06-20 ENCOUNTER — Ambulatory Visit (INDEPENDENT_AMBULATORY_CARE_PROVIDER_SITE_OTHER): Payer: Medicare HMO | Admitting: Podiatry

## 2021-06-20 ENCOUNTER — Other Ambulatory Visit: Payer: Self-pay

## 2021-06-20 DIAGNOSIS — L600 Ingrowing nail: Secondary | ICD-10-CM | POA: Diagnosis not present

## 2021-06-20 DIAGNOSIS — M79675 Pain in left toe(s): Secondary | ICD-10-CM | POA: Diagnosis not present

## 2021-06-20 DIAGNOSIS — M79674 Pain in right toe(s): Secondary | ICD-10-CM | POA: Diagnosis not present

## 2021-06-20 DIAGNOSIS — M25561 Pain in right knee: Secondary | ICD-10-CM | POA: Diagnosis not present

## 2021-06-20 DIAGNOSIS — B351 Tinea unguium: Secondary | ICD-10-CM

## 2021-06-20 DIAGNOSIS — E1143 Type 2 diabetes mellitus with diabetic autonomic (poly)neuropathy: Secondary | ICD-10-CM

## 2021-06-20 DIAGNOSIS — M25562 Pain in left knee: Secondary | ICD-10-CM | POA: Diagnosis not present

## 2021-06-20 DIAGNOSIS — M170 Bilateral primary osteoarthritis of knee: Secondary | ICD-10-CM | POA: Diagnosis not present

## 2021-06-20 DIAGNOSIS — M1711 Unilateral primary osteoarthritis, right knee: Secondary | ICD-10-CM | POA: Diagnosis not present

## 2021-06-23 ENCOUNTER — Encounter: Payer: Self-pay | Admitting: Podiatry

## 2021-06-23 NOTE — Progress Notes (Signed)
Subjective: Autumn Carson is a pleasant 65 y.o. female patient seen today for at risk foot care with history of diabetic neuropathy and painful elongated mycotic toenails 1-5 bilaterally which are tender when wearing enclosed shoe gear. Pain is relieved with periodic professional debridement.   Patient states their blood glucose was 107 mg/dl today.   She has cut her own toenails and states her left great toe is tender today.She denies any trauma, redness, drainage or swelling. Denies any fever, chills, night sweats, nausea or vomiting.  PCP is Gildardo Pounds, NP. Last visit was: 05/16/2021.  Allergies  Allergen Reactions   Lisinopril Anaphylaxis    angioedema   Other     Other reaction(s): throat swelling    Objective: Physical Exam  General: Autumn Carson is a pleasant 65 y.o. African American female, morbidly obese in NAD. AAO x 3.   Vascular:  CFT <3 seconds b/l LE. Palpable DP/PT pulses b/l LE. Digital hair present b/l. Skin temperature gradient WNL b/l. No pain with calf compression b/l. No edema noted b/l. No cyanosis or clubbing noted b/l LE.   Dermatological:  Pedal integument with normal turgor, texture and tone BLE. No open wounds b/l LE. No interdigital macerations noted b/l LE. Toenails 2-5 bilaterally elongated, discolored, dystrophic, thickened, and crumbly with subungual debris and tenderness to dorsal palpation. Incurvated nailplate medial border(s) bilateral great toes.  Nail border hypertrophy minimal. There is tenderness to palpation. Sign(s) of infection: no clinical signs of infection noted on examination today..   Musculoskeletal:  Normal muscle strength 5/5 to all lower extremity muscle groups bilaterally. No pain, crepitus or joint limitation noted with ROM b/l LE. No gross bony pedal deformities b/l. Patient ambulates independently without assistive aids.   Neurological:  Pt has subjective symptoms of neuropathy. Protective sensation  intact 5/5 intact bilaterally with 10g monofilament b/l. Vibratory sensation intact b/l.   Last A1c:   Hemoglobin A1C Latest Ref Rng & Units 02/07/2021  HGBA1C 4.8 - 5.6 % 6.7(H)  Some recent data might be hidden   Assessment and Plan:  1. Pain due to onychomycosis of toenails of both feet   2. Ingrown toenail without infection   3. Type 2 diabetes mellitus with diabetic autonomic neuropathy, without long-term current use of insulin ([place])     Patient was evaluated and treated and all questions answered. Consent given for treatment as described below: -Examined patient. -Toenails 1-5 b/l were debrided in length and girth with sterile nail nippers and dremel without iatrogenic bleeding.  -Offending nail border debrided and curretaged bilateral great toes utilizing sterile nail nipper and currette. Border cleansed with alcohol and triple antibiotic applied. No further treatment required by patient/caregiver. -Discussed procedures available to address chronic ingrown toenails such as partial matrixectomy vs temporary partial nail avulsion. She is interested in partial matrixectomy medial border b/l great toes. Will refer her to Dr. Blenda Mounts after the Thanksgiving holiday for evaluation. -Patient/POA to call should there be question/concern in the interim.  Return in about 3 months (around 09/20/2021).  Marzetta Board, DPM

## 2021-06-27 ENCOUNTER — Encounter: Payer: Self-pay | Admitting: Podiatry

## 2021-06-27 ENCOUNTER — Other Ambulatory Visit: Payer: Self-pay

## 2021-06-27 ENCOUNTER — Ambulatory Visit: Payer: Medicare HMO | Admitting: Podiatry

## 2021-06-27 DIAGNOSIS — L600 Ingrowing nail: Secondary | ICD-10-CM | POA: Diagnosis not present

## 2021-06-27 NOTE — Progress Notes (Signed)
Subjective:  Patient ID: Autumn Carson, female    DOB: 1956/07/03,   MRN: [id number]  Chief Complaint  Patient presents with   Nail Problem     matrixectomies medial border bilateral great toes for ingrown toenails.    65 y.o. female presents for concern of bilateral ingrown toenails. Patient relates she has been dealing with them for years and both sides of the right nail hurt and the inside of the left great toe hurts. Was sent here by Dr. Elisha Ponder to discuss matrixectomies.  . Denies any other pedal complaints. Denies n/v/f/c.   Past Medical History:  Diagnosis Date   Anxiety    Arthritis of both knees    Back pain    Cancer (HCC)    Thyroid   Chronic anemia    Depression 1998   Essential hypertension    GERD (gastroesophageal reflux disease)    History of thyroid cancer 2011   Papillary thyroid cancer s/p radiation   Hypothyroidism    Lumbar spinal stenosis    Obesity    PONV (postoperative nausea and vomiting)    Seasonal allergies    Type 2 diabetes mellitus with diabetic neuropathy (HCC)     Objective:  Physical Exam: Vascular: DP/PT pulses 2/4 bilateral. CFT <3 seconds. Normal hair growth on digits. No edema.  Skin. No lacerations or abrasions bilateral feet. Incurvation of bilateral borders right foot. Incurvation medial border of left foot. No erythema edema or purulence noted.  Musculoskeletal: MMT 5/5 bilateral lower extremities in DF, PF, Inversion and Eversion. Deceased ROM in DF of ankle joint.  Neurological: Sensation intact to light touch.   Assessment:   1. Ingrown nail of great toe of right foot   2. Ingrown nail of great toe of left foot      Plan:  Patient was evaluated and treated and all questions answered. Patient requesting removal of ingrown nail today. Procedure below.  Discussed procedure and post procedure care and patient expressed understanding.  Will follow-up in 2 weeks for nail check or sooner if any problems arise.     Procedure:  Procedure: partial Nail Avulsion of bilaterally hallux medial and lateral border right and medial left nail border.  Surgeon: Lorenda Peck, DPM  Pre-op Dx: Ingrown toenail without infection Post-op: Same  Place of Surgery: Office exam room.  Indications for surgery: Painful and ingrown toenail.    The patient is requesting removal of nail with chemical matrixectomy. Risks and complications were discussed with the patient for which they understand and written consent was obtained. Under sterile conditions a total of 3 mL of 1:1 mixture 0.5% marcaine plain and 1% lidocaine plain was infiltrated in a hallux block fashion. Once anesthetized, the skin was prepped in sterile fashion. A tourniquet was then applied. Next the medial aspect of left and right bilateral aspect of hallux nail border was then sharply excised making sure to remove the entire offending nail border.  Next phenol was then applied under standard conditions and copiously irrigated. Silvadene was applied. A dry sterile dressing was applied. After application of the dressing the tourniquet was removed and there is found to be an immediate capillary refill time to the digit. The patient tolerated the procedure well without any complications. Post procedure instructions were discussed the patient for which he verbally understood. Follow-up in two weeks for nail check or sooner if any problems are to arise. Discussed signs/symptoms of infection and directed to call the office immediately should any occur or go directly  to the emergency room. In the meantime, encouraged to call the office with any questions, concerns, changes symptoms.   Lorenda Peck, DPM

## 2021-06-27 NOTE — Patient Instructions (Signed)

## 2021-07-10 ENCOUNTER — Encounter: Payer: Self-pay | Admitting: Nurse Practitioner

## 2021-07-10 ENCOUNTER — Other Ambulatory Visit: Payer: Self-pay

## 2021-07-10 ENCOUNTER — Other Ambulatory Visit (HOSPITAL_COMMUNITY)
Admission: RE | Admit: 2021-07-10 | Discharge: 2021-07-10 | Disposition: A | Discharge status: Home / Self Care | Payer: Medicare HMO | Source: Ambulatory Visit | Attending: Nurse Practitioner | Admitting: Nurse Practitioner

## 2021-07-10 ENCOUNTER — Ambulatory Visit: Payer: Medicare HMO | Attending: Nurse Practitioner | Admitting: Nurse Practitioner

## 2021-07-10 VITALS — BP 161/93 | HR 88 | Ht 67.0 in | Wt 323.4 lb

## 2021-07-10 DIAGNOSIS — E118 Type 2 diabetes mellitus with unspecified complications: Secondary | ICD-10-CM

## 2021-07-10 DIAGNOSIS — R3121 Asymptomatic microscopic hematuria: Secondary | ICD-10-CM

## 2021-07-10 DIAGNOSIS — Z1151 Encounter for screening for human papillomavirus (HPV): Secondary | ICD-10-CM | POA: Diagnosis not present

## 2021-07-10 DIAGNOSIS — Z124 Encounter for screening for malignant neoplasm of cervix: Secondary | ICD-10-CM | POA: Diagnosis not present

## 2021-07-10 DIAGNOSIS — M5136 Other intervertebral disc degeneration, lumbar region: Secondary | ICD-10-CM

## 2021-07-10 DIAGNOSIS — Z01419 Encounter for gynecological examination (general) (routine) without abnormal findings: Secondary | ICD-10-CM | POA: Diagnosis not present

## 2021-07-10 DIAGNOSIS — Z23 Encounter for immunization: Secondary | ICD-10-CM

## 2021-07-10 LAB — POCT URINALYSIS DIP (CLINITEK)
Bilirubin, UA: NEGATIVE
Glucose, UA: NEGATIVE mg/dL
Ketones, POC UA: NEGATIVE mg/dL
Nitrite, UA: NEGATIVE
Spec Grav, UA: 1.01 (ref 1.010–1.025)
Urobilinogen, UA: 0.2 E.U./dL
pH, UA: 6 (ref 5.0–8.0)

## 2021-07-10 MED ORDER — PREGABALIN 50 MG PO CAPS: 50.0000 mg | ORAL_CAPSULE | Freq: Every evening | ORAL | 0 refills | Status: DC

## 2021-07-10 NOTE — Progress Notes (Signed)
Com

## 2021-07-10 NOTE — Progress Notes (Signed)
Assessment & Plan:  Autumn Carson was seen today for gynecologic exam.  Diagnoses and all orders for this visit:  Encounter for Papanicolaou smear for cervical cancer screening -     Cytology - PAP  Asymptomatic microscopic hematuria -     POCT URINALYSIS DIP (CLINITEK) -     Urinalysis, Complete -     Urine Culture  DDD (degenerative disc disease), lumbar -     pregabalin (LYRICA) 50 MG capsule; Take 1 capsule (50 mg total) by mouth at bedtime.  Encounter for administration of vaccine -     Pneumococcal conjugate vaccine 20-valent  Controlled type 2 diabetes mellitus with complication, without long-term current use of insulin ([place]) -     Hemoglobin A1c   Patient has been counseled on age-appropriate routine health concerns for screening and prevention. These are reviewed and up-to-date. Referrals have been placed accordingly. Immunizations are up-to-date or declined.    Subjective:   Chief Complaint  Patient presents with   Gynecologic Exam    Autumn Carson 65 y.o. female presents to office today for Pap smear.  She endorses seeing streaks of blood on the tissue after urination but denies any GU symptoms such as dysuria, urgency, frequency or bilateral flank pain.     Review of Systems  Constitutional: Negative.  Negative for chills, fever, malaise/fatigue and weight loss.  Respiratory: Negative.  Negative for cough, sputum production, shortness of breath and wheezing.   Cardiovascular: Negative.  Negative for chest pain and leg swelling.  Gastrointestinal: Negative.  Negative for abdominal pain, blood in stool, constipation, diarrhea, heartburn, melena, nausea and vomiting.  Genitourinary:  Positive for hematuria. Negative for dysuria, flank pain, frequency and urgency.  Skin: Negative.  Negative for rash.  Neurological: Negative.  Negative for dizziness, tremors, speech change, focal weakness, seizures and headaches.  Psychiatric/Behavioral: Negative.  Negative  for depression and suicidal ideas. The patient is not nervous/anxious and does not have insomnia.    Past Medical History:  Diagnosis Date   Anxiety    Arthritis of both knees    Back pain    Cancer (HCC)    Thyroid   Chronic anemia    Depression 1998   Essential hypertension    GERD (gastroesophageal reflux disease)    History of thyroid cancer 2011   Papillary thyroid cancer s/p radiation   Hypothyroidism    Lumbar spinal stenosis    Obesity    PONV (postoperative nausea and vomiting)    Seasonal allergies    Type 2 diabetes mellitus with diabetic neuropathy ([place])     Past Surgical History:  Procedure Laterality Date   BACK SURGERY     BIOPSY N/A 01/16/2013   Procedure: BIOPSY;  Surgeon: Danie Binder, MD;  [facility];  Service: Endoscopy;  Laterality: N/A;  SMALL BOWEL ULCERS   BRAVO [place] STUDY N/A 11/22/2015   Procedure: BRAVO [place];  Surgeon: Danie Binder, MD;  [facility];  Service: Endoscopy;  Laterality: N/A;   CARPAL TUNNEL RELEASE     4 total, bilaterally   CESAREAN SECTION     CHOLECYSTECTOMY  1990's   COLONOSCOPY WITH ESOPHAGOGASTRODUODENOSCOPY (EGD) N/A 12/08/2012   SLF: 1. Normal mucosa in the terminal ileum 2. mild diverticulosis throughout the entire examined colon. 3. rectal bleeding due to moderate sized internal hemorrhoids.    ELBOW SURGERY Bilateral    ENTEROSCOPY N/A 01/16/2013   Procedure: ENTEROSCOPY;  Surgeon: Danie Binder, MD;  [facility];  Service: Endoscopy;  Laterality: N/A;  2:00   ESOPHAGOGASTRODUODENOSCOPY N/A 11/22/2015   Procedure: ESOPHAGOGASTRODUODENOSCOPY (EGD);  Surgeon: Danie Binder, MD;  [facility];  Service: Endoscopy;  Laterality: N/A;  130 - moved to 2:00 - office to notify pt   GIVENS CAPSULE STUDY N/A 12/31/2012   Procedure: GIVENS CAPSULE STUDY;  Surgeon: Danie Binder, MD;  [facility];  Service: Endoscopy;  Laterality: N/A;  7:30   ROTATOR CUFF REPAIR Bilateral     bilaterally   SAVORY DILATION N/A 11/22/2015   Procedure: SAVORY DILATION;  Surgeon: Danie Binder, MD;  [facility];  Service: Endoscopy;  Laterality: N/A;   SPINE SURGERY     Lumbar Fusion- Dr. Trenton Gammon   TONSILECTOMY, ADENOIDECTOMY, BILATERAL [place]   TOTAL THYROIDECTOMY  2011   TUBAL LIGATION      Family History  Problem Relation Age of Onset   Heart disease Mother    Lung cancer Father        Age 12   Colon cancer Brother        Age 64   Heart attack Daughter 71   Hypertension Daughter    Other Brother        Archivist, age 64, cancer   Breast cancer Cousin     Social History Reviewed with no changes to be made today.   Outpatient Medications Prior to Visit  Medication Sig Dispense Refill   amitriptyline (ELAVIL) 10 MG tablet TAKE 1 TABLET AT BEDTIME 90 tablet 0   amLODipine (NORVASC) 10 MG tablet TAKE 1 TABLET EVERY DAY 90 tablet 3   aspirin 81 MG tablet Take 81 mg by mouth every other day.     atorvastatin (LIPITOR) 40 MG tablet Take 1 tablet (40 mg total) by mouth daily. 90 tablet 3   Blood Glucose Monitoring Suppl (TRUE METRIX AIR GLUCOSE METER) w/Device KIT USE AS DIRECTED 1 kit 0   celecoxib (CELEBREX) 100 MG capsule Take 1 capsule (100 mg total) by mouth 2 (two) times daily as needed for moderate pain. 60 capsule 1   chlorthalidone (HYGROTON) 25 MG tablet TAKE 1 TABLET EVERY DAY 90 tablet 0   ciclopirox (PENLAC) 8 % solution Apply one coat to toenail qd for 48 weeks.  Remove weekly with polish remover. 6.6 mL 11   cyclobenzaprine (FLEXERIL) 10 MG tablet 1 tablet as needed     diclofenac sodium (VOLTAREN) 1 % GEL Apply dime size to knees three times a day as needed (Patient taking differently: Apply 1 application topically 3 (three) times daily as needed (pain). Apply dime size to knees three times a day as needed) 100 g 3   FLUoxetine (PROZAC) 40 MG capsule TAKE 1 CAPSULE EVERY DAY BEFORE BREAKFAST 90 capsule  1   hydrALAZINE (APRESOLINE) 10 MG tablet Take 1 tablet (10 mg total) by mouth 3 (three) times daily. 270 tablet 3   levothyroxine (SYNTHROID) 175 MCG tablet Take 175 mcg by mouth daily before breakfast.     MODERNA COVID-19 BIVAL BOOSTER 50 MCG/0.5ML injection      MODERNA COVID-19 VACCINE 100 MCG/0.5ML injection      nebivolol (BYSTOLIC) 10 MG tablet TAKE 1 TABLET EVERY DAY 90 tablet 0   omeprazole (PRILOSEC) 40 MG capsule TAKE 1 CAPSULE TWICE DAILY  30  MINUTES  PRIOR  TO  MEALS 180 capsule 3   SHINGRIX injection  simvastatin (ZOCOR) 40 MG tablet      TRUE METRIX BLOOD GLUCOSE TEST test strip CHECK FINGER STICK BLOOD SUGAR TWICE DAILY AS DIRECTED 200 strip 3   zolpidem (AMBIEN) 5 MG tablet TAKE 1 TABLET EVERY NIGHT AT BEDTIME AS NEEDED FOR SLEEP 30 tablet 3   gabapentin (NEURONTIN) 300 MG capsule TAKE 1 CAPSULE THREE TIMES DAILY  (DOSE  CHANGE) 270 capsule 1   metFORMIN (GLUCOPHAGE) 1000 MG tablet Take 1 tablet (1,000 mg total) by mouth 2 (two) times daily with a meal. 180 tablet 1   No facility-administered medications prior to visit.    Allergies  Allergen Reactions   Lisinopril Anaphylaxis    angioedema   Other     Other reaction(s): throat swelling       Objective:    BP (!) 161/93   Pulse 88   Ht _0  (1.702 m)   Wt (!) 323 lb 6 oz (146.7 kg)   LMP 09/07/2011   SpO2 100%   BMI 50.65 kg/m  Wt Readings from Last 3 Encounters:  07/10/21 (!) 323 lb 6 oz (146.7 kg)  05/01/21 (!) 328 lb 8 oz (149 kg)  02/07/21 (!) 327 lb (148.3 kg)    Physical Exam Constitutional:      Appearance: She is well-developed.  HENT:     Head: Normocephalic.  Cardiovascular:     Rate and Rhythm: Normal rate and regular rhythm.     Heart sounds: Normal heart sounds.  Pulmonary:     Effort: Pulmonary effort is normal.     Breath sounds: Normal breath sounds.  Abdominal:     General: Bowel sounds are normal.     Palpations: Abdomen is soft.     Hernia: There is no hernia in the  left inguinal area.  Genitourinary:    Exam position: Lithotomy position.     Labia:        Right: No rash, tenderness, lesion or injury.        Left: No rash, tenderness, lesion or injury.      Vagina: Normal. No signs of injury and foreign body. No vaginal discharge, erythema, tenderness or bleeding.     Cervix: No cervical motion tenderness or friability.     Uterus: Not deviated and not enlarged.      Adnexa:        Right: No mass, tenderness or fullness.         Left: No mass, tenderness or fullness.       Rectum: Normal. No external hemorrhoid.  Lymphadenopathy:     Lower Body: No right inguinal adenopathy. No left inguinal adenopathy.  Skin:    General: Skin is warm and dry.  Neurological:     Mental Status: She is alert and oriented to person, place, and time.  Psychiatric:        Behavior: Behavior normal.        Thought Content: Thought content normal.        Judgment: Judgment normal.         Patient has been counseled extensively about nutrition and exercise as well as the importance of adherence with medications and regular follow-up. The patient was given clear instructions to go to ER or return to medical center if symptoms don't improve, worsen or new problems develop. The patient verbalized understanding.   Follow-up: Return in about 3 months (around 10/08/2021).   Gildardo Pounds, FNP-BC [facility] and [facility] [place], [place]  07/10/2021, 1:05 PM

## 2021-07-11 ENCOUNTER — Encounter: Payer: Self-pay | Admitting: Podiatry

## 2021-07-11 ENCOUNTER — Other Ambulatory Visit: Payer: Self-pay | Admitting: Nurse Practitioner

## 2021-07-11 ENCOUNTER — Ambulatory Visit: Payer: Medicare HMO | Admitting: Podiatry

## 2021-07-11 DIAGNOSIS — E1143 Type 2 diabetes mellitus with diabetic autonomic (poly)neuropathy: Secondary | ICD-10-CM

## 2021-07-11 DIAGNOSIS — L600 Ingrowing nail: Secondary | ICD-10-CM

## 2021-07-11 DIAGNOSIS — M170 Bilateral primary osteoarthritis of knee: Secondary | ICD-10-CM

## 2021-07-11 DIAGNOSIS — M5136 Other intervertebral disc degeneration, lumbar region: Secondary | ICD-10-CM

## 2021-07-11 LAB — MICROSCOPIC EXAMINATION
Casts: NONE SEEN /lpf
WBC, UA: >30 /hpf — AB (ref 0–5)

## 2021-07-11 LAB — URINALYSIS, COMPLETE
Bilirubin, UA: NEGATIVE
Glucose, UA: NEGATIVE
Ketones, UA: NEGATIVE
Nitrite, UA: NEGATIVE
Specific Gravity, UA: 1.007 (ref 1.005–1.030)
Urobilinogen, Ur: 0.2 mg/dL (ref 0.2–1.0)
pH, UA: 6.5 (ref 5.0–7.5)

## 2021-07-11 LAB — HEMOGLOBIN A1C
Est. average glucose Bld gHb Est-mCnc: 180 mg/dL
Hgb A1c MFr Bld: 7.9 % — ABNORMAL HIGH (ref 4.8–5.6)

## 2021-07-11 MED ORDER — SULFAMETHOXAZOLE-TRIMETHOPRIM 800-160 MG PO TABS: 1.0000 | ORAL_TABLET | Freq: Two times a day (BID) | ORAL | 0 refills | Status: AC

## 2021-07-11 NOTE — Telephone Encounter (Signed)
Requested Prescriptions  Pending Prescriptions Disp Refills  . celecoxib (CELEBREX) 100 MG capsule [Pharmacy Med Name: CELECOXIB 100MG  CAPSULES] 60 capsule 1    Sig: TAKE 1 CAPSULE(100 MG) BY MOUTH TWICE DAILY AS NEEDED FOR MODERATE PAIN     Analgesics:  COX2 Inhibitors Failed - 07/11/2021  6:20 AM      Failed - HGB in normal range and within 360 days    Hemoglobin  Date Value Ref Range Status  02/07/2021 11.0 (L) 11.1 - 15.9 g/dL Final         Passed - Cr in normal range and within 360 days    Creat  Date Value Ref Range Status  06/06/2020 0.84 0.50 - 0.99 mg/dL Final    Comment:    For patients >29 years of age, the reference limit for Creatinine is approximately 13% higher for people identified as African-American. .    Creatinine, Ser  Date Value Ref Range Status  05/22/2021 0.71 0.57 - 1.00 mg/dL Final   Creatinine, Urine  Date Value Ref Range Status  06/06/2020 119 20 - 275 mg/dL Final         Passed - Patient is not pregnant      Passed - Valid encounter within last 12 months    Recent Outpatient Visits          Yesterday Encounter for Papanicolaou smear for cervical cancer screening   [place], Vernia Buff, NP   1 month ago Essential hypertension   [place], Vernia Buff, NP   2 months ago Essential hypertension   [place], Vernia Buff, NP   4 months ago Occipital headache   [place], NP   5 months ago Occipital headache   [place], Vernia Buff, NP      Future Appointments            In 3 months Gildardo Pounds, NP [place]

## 2021-07-11 NOTE — Progress Notes (Signed)
  Subjective:  Patient ID: Autumn Carson, female    DOB: 08-Jan-1956,   MRN: [id number]  Chief Complaint  Patient presents with   Nail Problem    2 week nail follow up - patient states she has no pain or discharge and nail are much better since the last visit     65 y.o. female presents for follow-up of ingrown nail procedure bilateral hallux nails. Relates she is doing well without pain. Has been soaking and applying ointment . Denies any other pedal complaints. Denies n/v/f/c.   Past Medical History:  Diagnosis Date   Anxiety    Arthritis of both knees    Back pain    Cancer (HCC)    Thyroid   Chronic anemia    Depression 1998   Essential hypertension    GERD (gastroesophageal reflux disease)    History of thyroid cancer 2011   Papillary thyroid cancer s/p radiation   Hypothyroidism    Lumbar spinal stenosis    Obesity    PONV (postoperative nausea and vomiting)    Seasonal allergies    Type 2 diabetes mellitus with diabetic neuropathy (HCC)     Objective:  Physical Exam: Vascular: DP/PT pulses 2/4 bilateral. CFT <3 seconds. Normal hair growth on digits. No edema.  Skin. No lacerations or abrasions bilateral feet. No incurvation noted to bilateral hallux bilateral borders.  Musculoskeletal: MMT 5/5 bilateral lower extremities in DF, PF, Inversion and Eversion. Deceased ROM in DF of ankle joint.  Neurological: Sensation intact to light touch.   Assessment:  No diagnosis found.   Plan:  Patient was evaluated and treated and all questions answered. Toe was evaluated and appears to be healing well.  May discontinue soaks and neosporin.  Patient to follow-up as needed.    Lorenda Peck, DPM

## 2021-07-12 LAB — CYTOLOGY - PAP
Adequacy: ABSENT
Comment: NEGATIVE
Diagnosis: NEGATIVE
High risk HPV: NEGATIVE

## 2021-07-14 ENCOUNTER — Other Ambulatory Visit: Payer: Self-pay | Admitting: Nurse Practitioner

## 2021-07-14 LAB — URINE CULTURE

## 2021-08-01 ENCOUNTER — Other Ambulatory Visit: Payer: Self-pay | Admitting: Family Medicine

## 2021-08-01 ENCOUNTER — Other Ambulatory Visit: Payer: Self-pay | Admitting: Nurse Practitioner

## 2021-08-01 DIAGNOSIS — R519 Headache, unspecified: Secondary | ICD-10-CM

## 2021-08-01 DIAGNOSIS — I10 Essential (primary) hypertension: Secondary | ICD-10-CM

## 2021-08-01 DIAGNOSIS — E119 Type 2 diabetes mellitus without complications: Secondary | ICD-10-CM | POA: Diagnosis not present

## 2021-08-01 DIAGNOSIS — M5136 Other intervertebral disc degeneration, lumbar region: Secondary | ICD-10-CM

## 2021-08-01 DIAGNOSIS — H25813 Combined forms of age-related cataract, bilateral: Secondary | ICD-10-CM | POA: Diagnosis not present

## 2021-08-01 DIAGNOSIS — H5213 Myopia, bilateral: Secondary | ICD-10-CM | POA: Diagnosis not present

## 2021-08-01 NOTE — Telephone Encounter (Signed)
DO NOT FILL UNTIL 08-10-2020

## 2021-08-10 ENCOUNTER — Other Ambulatory Visit: Payer: Self-pay | Admitting: Nurse Practitioner

## 2021-08-10 DIAGNOSIS — F5104 Psychophysiologic insomnia: Secondary | ICD-10-CM

## 2021-08-10 NOTE — Telephone Encounter (Signed)
Medication Refill - Medication:zolpidem (AMBIEN) 5 MG  Has the patient contacted their pharmacy? yes (Agent: If no, request that the patient contact the pharmacy for the refill. If patient does not wish to contact the pharmacy document the reason why and proceed with request.) (Agent: If yes, when and what did the pharmacy advise?)cpharmacy called in   Preferred Pharmacy (with phone number or street name) [facility], [place] Phone:  [phone]  Fax:  [phone]     Has the patient been seen for an appointment in the last year OR does the patient have an upcoming appointment? yes  Agent: Please be advised that RX refills may take up to 3 business days. We ask that you follow-up with your pharmacy.

## 2021-08-10 NOTE — Telephone Encounter (Signed)
Requested medication (s) are due for refill today:   Provider to review  Requested medication (s) are on the active medication list:   Yes  Future visit scheduled:   Yes   Last ordered: 02/07/2021 #30, 3 refills  Non delegated refill reason returned.   Requested Prescriptions  Pending Prescriptions Disp Refills   zolpidem (AMBIEN) 5 MG tablet 30 tablet 3    Sig: TAKE 1 TABLET EVERY NIGHT AT BEDTIME AS NEEDED FOR SLEEP     Not Delegated - Psychiatry:  Anxiolytics/Hypnotics Failed - 08/10/2021 10:04 AM      Failed - This refill cannot be delegated      Failed - Urine Drug Screen completed in last 360 days      Passed - Valid encounter within last 6 months    Recent Outpatient Visits           1 month ago Encounter for Papanicolaou smear for cervical cancer screening   [place], Vernia Buff, NP   2 months ago Essential hypertension   [place], Vernia Buff, NP   3 months ago Essential hypertension   [place], Vernia Buff, NP   5 months ago Occipital headache   [place], Vernia Buff, NP   6 months ago Occipital headache   [place], Vernia Buff, NP       Future Appointments             In 2 months Gildardo Pounds, NP [place]

## 2021-08-11 MED ORDER — ZOLPIDEM TARTRATE 5 MG PO TABS: ORAL_TABLET | ORAL | 3 refills | Status: DC

## 2021-08-15 ENCOUNTER — Other Ambulatory Visit: Payer: Self-pay

## 2021-08-15 ENCOUNTER — Ambulatory Visit
Admission: RE | Admit: 2021-08-15 | Discharge: 2021-08-15 | Disposition: A | Discharge status: Home / Self Care | Payer: Medicare HMO | Source: Ambulatory Visit | Attending: Nurse Practitioner | Admitting: Nurse Practitioner

## 2021-08-15 DIAGNOSIS — M810 Age-related osteoporosis without current pathological fracture: Secondary | ICD-10-CM

## 2021-08-15 DIAGNOSIS — Z780 Asymptomatic menopausal state: Secondary | ICD-10-CM | POA: Diagnosis not present

## 2021-09-02 DIAGNOSIS — Z0289 Encounter for other administrative examinations: Secondary | ICD-10-CM

## 2021-09-09 ENCOUNTER — Other Ambulatory Visit: Payer: Self-pay | Admitting: Nurse Practitioner

## 2021-09-09 DIAGNOSIS — M170 Bilateral primary osteoarthritis of knee: Secondary | ICD-10-CM

## 2021-09-09 DIAGNOSIS — M5136 Other intervertebral disc degeneration, lumbar region: Secondary | ICD-10-CM

## 2021-09-09 NOTE — Telephone Encounter (Signed)
Requested medications are due for refill today.  yes  Requested medications are on the active medications list.  yes  Last refill. 07/11/2021 #60 1 refill  Future visit scheduled.   yes  Notes to clinic.  Failed protocol d/t abnormal lab.    Requested Prescriptions  Pending Prescriptions Disp Refills   celecoxib (CELEBREX) 100 MG capsule [Pharmacy Med Name: CELECOXIB 100MG CAPSULES] 60 capsule 1    Sig: TAKE 1 CAPSULE(100 MG) BY MOUTH TWICE DAILY AS NEEDED FOR MODERATE PAIN     Analgesics:  COX2 Inhibitors Failed - 09/09/2021  6:24 AM      Failed - Manual Review: Labs are only required if the patient has taken medication for more than 8 weeks.      Failed - HGB in normal range and within 360 days    Hemoglobin  Date Value Ref Range Status  02/07/2021 11.0 (L) 11.1 - 15.9 g/dL Final          Passed - Cr in normal range and within 360 days    Creat  Date Value Ref Range Status  06/06/2020 0.84 0.50 - 0.99 mg/dL Final    Comment:    For patients >56 years of age, the reference limit for Creatinine is approximately 13% higher for people identified as African-American. .    Creatinine, Ser  Date Value Ref Range Status  05/22/2021 0.71 0.57 - 1.00 mg/dL Final   Creatinine, Urine  Date Value Ref Range Status  06/06/2020 119 20 - 275 mg/dL Final          Passed - HCT in normal range and within 360 days    Hematocrit  Date Value Ref Range Status  02/07/2021 34.2 34.0 - 46.6 % Final          Passed - AST in normal range and within 360 days    AST  Date Value Ref Range Status  05/22/2021 18 0 - 40 IU/L Final          Passed - ALT in normal range and within 360 days    ALT  Date Value Ref Range Status  05/22/2021 7 0 - 32 IU/L Final          Passed - eGFR is 30 or above and within 360 days    GFR calc Af Amer  Date Value Ref Range Status  02/12/2020 >60 >60 mL/min Final   GFR calc non Af Amer  Date Value Ref Range Status  02/12/2020 >60 >60 mL/min Final    eGFR  Date Value Ref Range Status  05/22/2021 95 >59 mL/min/1.73 Final          Passed - Patient is not pregnant      Passed - Valid encounter within last 12 months    Recent Outpatient Visits           2 months ago Encounter for Papanicolaou smear for cervical cancer screening   [place], Vernia Buff, NP   3 months ago Essential hypertension   [place], Vernia Buff, NP   4 months ago Essential hypertension   [place], Vernia Buff, NP   6 months ago Occipital headache   [place], NP   7 months ago Occipital headache   [place], Vernia Buff, NP       Future Appointments  In 1 month Gildardo Pounds, NP [place]

## 2021-09-12 ENCOUNTER — Other Ambulatory Visit: Payer: Self-pay

## 2021-09-12 ENCOUNTER — Ambulatory Visit (INDEPENDENT_AMBULATORY_CARE_PROVIDER_SITE_OTHER): Payer: Medicare HMO | Admitting: Family Medicine

## 2021-09-12 ENCOUNTER — Encounter (INDEPENDENT_AMBULATORY_CARE_PROVIDER_SITE_OTHER): Payer: Self-pay | Admitting: Family Medicine

## 2021-09-12 VITALS — BP 151/86 | HR 85 | Temp 98.1°F | Ht 65.0 in | Wt 314.0 lb

## 2021-09-12 DIAGNOSIS — E890 Postprocedural hypothyroidism: Secondary | ICD-10-CM

## 2021-09-12 DIAGNOSIS — R5383 Other fatigue: Secondary | ICD-10-CM | POA: Diagnosis not present

## 2021-09-12 DIAGNOSIS — F418 Other specified anxiety disorders: Secondary | ICD-10-CM | POA: Diagnosis not present

## 2021-09-12 DIAGNOSIS — D649 Anemia, unspecified: Secondary | ICD-10-CM | POA: Diagnosis not present

## 2021-09-12 DIAGNOSIS — E1159 Type 2 diabetes mellitus with other circulatory complications: Secondary | ICD-10-CM | POA: Diagnosis not present

## 2021-09-12 DIAGNOSIS — E1169 Type 2 diabetes mellitus with other specified complication: Secondary | ICD-10-CM

## 2021-09-12 DIAGNOSIS — E1165 Type 2 diabetes mellitus with hyperglycemia: Secondary | ICD-10-CM | POA: Diagnosis not present

## 2021-09-12 DIAGNOSIS — I152 Hypertension secondary to endocrine disorders: Secondary | ICD-10-CM | POA: Diagnosis not present

## 2021-09-12 DIAGNOSIS — E559 Vitamin D deficiency, unspecified: Secondary | ICD-10-CM | POA: Diagnosis not present

## 2021-09-12 DIAGNOSIS — R0602 Shortness of breath: Secondary | ICD-10-CM

## 2021-09-12 DIAGNOSIS — E785 Hyperlipidemia, unspecified: Secondary | ICD-10-CM | POA: Diagnosis not present

## 2021-09-12 DIAGNOSIS — Z6841 Body Mass Index (BMI) 40.0 and over, adult: Secondary | ICD-10-CM

## 2021-09-12 DIAGNOSIS — K760 Fatty (change of) liver, not elsewhere classified: Secondary | ICD-10-CM

## 2021-09-13 LAB — ANEMIA PANEL
Ferritin: 30 ng/mL (ref 15–150)
Folate, Hemolysate: 338 ng/mL
Folate, RBC: 926 ng/mL (ref >498)
Hematocrit: 36.5 % (ref 34.0–46.6)
Iron Saturation: 16 % (ref 15–55)
Iron: 55 ug/dL (ref 27–139)
Retic Ct Pct: 1.5 % (ref 0.6–2.6)
Total Iron Binding Capacity: 346 ug/dL (ref 250–450)
UIBC: 291 ug/dL (ref 118–369)
Vitamin B-12: 343 pg/mL (ref 232–1245)

## 2021-09-13 LAB — COMPREHENSIVE METABOLIC PANEL
ALT: 27 IU/L (ref 0–32)
AST: 29 IU/L (ref 0–40)
Albumin/Globulin Ratio: 1.9 (ref 1.2–2.2)
Albumin: 4.8 g/dL (ref 3.8–4.8)
Alkaline Phosphatase: 96 IU/L (ref 44–121)
BUN/Creatinine Ratio: 11 — ABNORMAL LOW (ref 12–28)
BUN: 12 mg/dL (ref 8–27)
Bilirubin Total: 0.4 mg/dL (ref 0.0–1.2)
CO2: 28 mmol/L (ref 20–29)
Calcium: 9.3 mg/dL (ref 8.7–10.3)
Chloride: 97 mmol/L (ref 96–106)
Creatinine, Ser: 1.1 mg/dL — ABNORMAL HIGH (ref 0.57–1.00)
Globulin, Total: 2.5 g/dL (ref 1.5–4.5)
Glucose: 112 mg/dL — ABNORMAL HIGH (ref 70–99)
Potassium: 4 mmol/L (ref 3.5–5.2)
Sodium: 140 mmol/L (ref 134–144)
Total Protein: 7.3 g/dL (ref 6.0–8.5)
eGFR: 56 mL/min/{1.73_m2} — ABNORMAL LOW (ref >59)

## 2021-09-13 LAB — LIPID PANEL WITH LDL/HDL RATIO
Cholesterol, Total: 156 mg/dL (ref 100–199)
HDL: 52 mg/dL (ref >39)
LDL Chol Calc (NIH): 82 mg/dL (ref 0–99)
LDL/HDL Ratio: 1.6 ratio (ref 0.0–3.2)
Triglycerides: 127 mg/dL (ref 0–149)
VLDL Cholesterol Cal: 22 mg/dL (ref 5–40)

## 2021-09-13 LAB — THYROID PANEL WITH TSH
Free Thyroxine Index: 2.6 (ref 1.2–4.9)
T3 Uptake Ratio: 27 % (ref 24–39)
T4, Total: 9.5 ug/dL (ref 4.5–12.0)
TSH: 2.62 u[IU]/mL (ref 0.450–4.500)

## 2021-09-13 LAB — VITAMIN D 25 HYDROXY (VIT D DEFICIENCY, FRACTURES): Vit D, 25-Hydroxy: 13.9 ng/mL — ABNORMAL LOW (ref 30.0–100.0)

## 2021-09-13 LAB — CBC WITH DIFFERENTIAL/PLATELET
Basophils Absolute: 0 10*3/uL (ref 0.0–0.2)
Basos: 1 %
EOS (ABSOLUTE): 0.2 10*3/uL (ref 0.0–0.4)
Eos: 3 %
Hemoglobin: 11.6 g/dL (ref 11.1–15.9)
Immature Grans (Abs): 0 10*3/uL (ref 0.0–0.1)
Immature Granulocytes: 0 %
Lymphocytes Absolute: 2.1 10*3/uL (ref 0.7–3.1)
Lymphs: 35 %
MCH: 25.1 pg — ABNORMAL LOW (ref 26.6–33.0)
MCHC: 31.8 g/dL (ref 31.5–35.7)
MCV: 79 fL (ref 79–97)
Monocytes Absolute: 0.4 10*3/uL (ref 0.1–0.9)
Monocytes: 7 %
Neutrophils Absolute: 3.2 10*3/uL (ref 1.4–7.0)
Neutrophils: 54 %
Platelets: 309 10*3/uL (ref 150–450)
RBC: 4.63 x10E6/uL (ref 3.77–5.28)
RDW: 14.8 % (ref 11.7–15.4)
WBC: 5.9 10*3/uL (ref 3.4–10.8)

## 2021-09-13 LAB — INSULIN, RANDOM: INSULIN: 12.6 u[IU]/mL (ref 2.6–24.9)

## 2021-09-13 NOTE — Progress Notes (Signed)
Dear Dr. Lyla Glassing,   Thank you for referring Autumn Carson to our clinic. The following note includes my evaluation and treatment recommendations.  Chief Complaint:   OBESITY Autumn Carson (MR# [id number]) is a 66 y.o. female who presents for evaluation and treatment of obesity and related comorbidities. Current BMI is Body mass index is 52.25 kg/m. Autumn Carson has been struggling with her weight for many years and has been unsuccessful in either losing weight, maintaining weight loss, or reaching her healthy weight goal.  [name] is currently in the action stage of change and ready to dedicate time achieving and maintaining a healthier weight. Autumn Carson is interested in becoming our patient and working on intensive lifestyle modifications including (but not limited to) diet and exercise for weight loss.  Referred by Dr. Lyla Glassing. Pt needs both knees replaced. She has a h/o thyroid cancer. Pt goes out to eat 2-3 times a week with her children. Skips breakfast and lunch most days. She will drink coffee and or tea in the morning, instead of having breakfast. Around 4-5 pm, 1 cup okra and 5-6 fish sticks.  There were some difficulties obtaining RMR.  Autumn Carson's habits were reviewed today and are as follows: her desired weight loss is 59 lbs, she started gaining weight after her son was born in 82, her heaviest weight ever was 358 pounds, she is a picky eater and doesn't like to eat healthier foods, she has significant food cravings issues, she snacks frequently in the evenings, she skips meals frequently, she is frequently drinking liquids with calories, she frequently makes poor food choices, she frequently eats larger portions than normal, she has binge eating behaviors, and she struggles with emotional eating.  Depression Screen Devann's Food and Mood (modified PHQ-9) score was 10.  Depression screen PHQ 2/9 09/12/2021  Decreased Interest 1  Down, Depressed, Hopeless 3  PHQ - 2  Score 4  Altered sleeping 3  Tired, decreased energy 2  Change in appetite 1  Feeling bad or failure about yourself  0  Trouble concentrating 0  Moving slowly or fidgety/restless 0  Suicidal thoughts 0  PHQ-9 Score 10  Difficult doing work/chores Not difficult at all  Some recent data might be hidden   Subjective:   1. Other fatigue Autumn Carson admits to daytime somnolence and admits to waking up still tired. Patient has a history of symptoms of daytime fatigue and morning fatigue. Autumn Carson generally gets 5 hours of sleep per night, and states that she has poor sleep quality. Snoring is present. Apneic episodes are not present. Epworth Sleepiness Score is 8. EKG normal sinus rhythm at 85 bpm.  2. SOBOE (shortness of breath on exertion) Autumn Carson notes increasing shortness of breath with exercising and seems to be worsening over time with weight gain. She notes getting out of breath sooner with activity than she used to. This has gotten worse recently. Autumn Carson denies shortness of breath at rest or orthopnea.  3. Type 2 diabetes mellitus with hyperglycemia, without long-term current use of insulin (HCC) Pt's last A1c was 7.9. She is on Metformin.  4. Hypertension associated with diabetes ([place]) Dx at least 20 years ago. BP elevated today. Pt is on amlodipine, chlorthalidone, hydralazine, nebivolol.   5. Postoperative hypothyroidism Pt sees Dr. Buddy Duty at [facility] and is on levothyroxine.  6. Hyperlipidemia associated with type 2 diabetes mellitus ([place]) Dx ~1 year ago. Pt is on Lipitor.  7. Depression with anxiety Dawna feel well managed on fluoxetine and amitriptyline.  8. Anemia, unspecified type Pt's last Hgb was low and MCV within normal limits.  9. Vitamin D deficiency Dx likely given obesity.  10. NAFLD (nonalcoholic fatty liver disease) Pt had ultrasound years ago.  Assessment/Plan:   1. Other fatigue Autumn Carson does feel that her weight is causing her energy to be lower than it should  be. Fatigue may be related to obesity, depression or many other causes. Labs will be ordered, and in the meanwhile, Autumn Carson will focus on self care including making healthy food choices, increasing physical activity and focusing on stress reduction.  - EKG 12-Lead  2. SOBOE (shortness of breath on exertion) Autumn Carson does feel that she gets out of breath more easily that she used to when she exercises. Autumn Carson's shortness of breath appears to be obesity related and exercise induced. She has agreed to work on weight loss and gradually increase exercise to treat her exercise induced shortness of breath. Will continue to monitor closely.  3. Type 2 diabetes mellitus with hyperglycemia, without long-term current use of insulin (HCC) Good blood sugar control is important to decrease the likelihood of diabetic complications such as nephropathy, neuropathy, limb loss, blindness, coronary artery disease, and death. Intensive lifestyle modification including diet, exercise and weight loss are the first line of treatment for diabetes. Check labs today.  - Insulin, random  4. Hypertension associated with diabetes ([place]) Autumn Carson is working on healthy weight loss and exercise to improve blood pressure control. We will watch for signs of hypotension as she continues her lifestyle modifications. Check labs today.  - Comprehensive metabolic panel  5. Postoperative hypothyroidism Patient with long-standing hypothyroidism, on levothyroxine therapy. She appears euthyroid. Orders and follow up as documented in patient record.  Counseling Good thyroid control is important for overall health. Supratherapeutic thyroid levels are dangerous and will not improve weight loss results. The correct way to take levothyroxine is fasting, with water, separated by at least 30 minutes from breakfast, and separated by more than 4 hours from calcium, iron, multivitamins, acid reflux medications (PPIs).  Check labs today.  - Thyroid  Panel With TSH  6. Hyperlipidemia associated with type 2 diabetes mellitus ([place]) Cardiovascular risk and specific lipid/LDL goals reviewed.  We discussed several lifestyle modifications today and Jennamarie will continue to work on diet, exercise and weight loss efforts. Orders and follow up as documented in patient record.   Counseling Intensive lifestyle modifications are the first line treatment for this issue. Dietary changes: Increase soluble fiber. Decrease simple carbohydrates. Exercise changes: Moderate to vigorous-intensity aerobic activity 150 minutes per week if tolerated. Lipid-lowering medications: see documented in medical record. Check labs today.  - Lipid Panel With LDL/HDL Ratio  7. Depression with anxiety Behavior modification techniques were discussed today to help Takina deal with her emotional/non-hunger eating behaviors.  Orders and follow up as documented in patient record. F/u on sxs at next appt.  8. Anemia, unspecified type Check labs today.  - CBC w/Diff/Platelet - Anemia panel  9. Vitamin D deficiency Check labs today.  - VITAMIN D 25 Hydroxy (Vit-D Deficiency, Fractures)  10. NAFLD (nonalcoholic fatty liver disease) We discussed the likely diagnosis of non-alcoholic fatty liver disease today and how this condition is obesity related. Charl was educated the importance of weight loss. Eulia agreed to continue with her weight loss efforts with healthier diet and exercise as an essential part of her treatment plan. Check CMP.  11. Obesity with current BMI of 52.4 Maeve is currently in the action stage of change  and her goal is to continue with weight loss efforts. I recommend Kjersti begin the structured treatment plan as follows:  She has agreed to the Category 3 Plan. Pt will start with category 3 in hopes to get pt up towards RMR.  Exercise goals: No exercise has been prescribed at this time.   Behavioral modification strategies: increasing lean protein  intake, no skipping meals, and meal planning and cooking strategies.  She was informed of the importance of frequent follow-up visits to maximize her success with intensive lifestyle modifications for her multiple health conditions. She was informed we would discuss her lab results at her next visit unless there is a critical issue that needs to be addressed sooner. Zorah agreed to keep her next visit at the agreed upon time to discuss these results.  Objective:   Blood pressure (!) 151/86, pulse 85, temperature 98.1 F (36.7 C), height 5\' 5"  (1.651 m), weight (!) 314 lb (142.4 kg), last menstrual period 09/07/2011, SpO2 97 %. Body mass index is 52.25 kg/m.  EKG: Normal sinus rhythm, rate 85.  Indirect Calorimeter completed today shows a VO2 of 378 and a REE of 2606.  Her calculated basal metabolic rate is 6962 thus her basal metabolic rate is better than expected.  General: Cooperative, alert, well developed, in no acute distress. HEENT: Conjunctivae and lids unremarkable. Cardiovascular: Regular rhythm.  Lungs: Normal work of breathing. Neurologic: No focal deficits.   Lab Results  Component Value Date   CREATININE 1.10 (H) 09/12/2021   BUN 12 09/12/2021   NA 140 09/12/2021   K 4.0 09/12/2021   CL 97 09/12/2021   CO2 28 09/12/2021   Lab Results  Component Value Date   ALT 27 09/12/2021   AST 29 09/12/2021   ALKPHOS 96 09/12/2021   BILITOT 0.4 09/12/2021   Lab Results  Component Value Date   HGBA1C 7.9 (H) 07/10/2021   HGBA1C 6.7 (H) 02/07/2021   HGBA1C 6.7 (H) 06/06/2020   HGBA1C 6.2 (H) 02/02/2020   HGBA1C 6.4 (H) 09/29/2019   Lab Results  Component Value Date   INSULIN 12.6 09/12/2021   Lab Results  Component Value Date   TSH 2.620 09/12/2021   Lab Results  Component Value Date   CHOL 156 09/12/2021   HDL 52 09/12/2021   LDLCALC 82 09/12/2021   TRIG 127 09/12/2021   CHOLHDL 3.1 02/07/2021   Lab Results  Component Value Date   WBC 5.9 09/12/2021    HGB 11.6 09/12/2021   HCT 36.5 09/12/2021   MCV 79 09/12/2021   PLT 309 09/12/2021   Lab Results  Component Value Date   IRON 55 09/12/2021   TIBC 346 09/12/2021   FERRITIN 30 09/12/2021    Attestation Statements:   Reviewed by clinician on day of visit: allergies, medications, problem list, medical history, surgical history, family history, social history, and previous encounter notes.  Coral Ceo, CMA, am acting as transcriptionist for Coralie Common, MD.   This is the patient's first visit at [facility]. The patient's NEW PATIENT PACKET was reviewed at length. Included in the packet: current and past health history, medications, allergies, ROS, gynecologic history (women only), surgical history, family history, social history, weight history, weight loss surgery history (for those that have had weight loss surgery), nutritional evaluation, mood and food questionnaire, PHQ9, Epworth questionnaire, sleep habits questionnaire, patient life and health improvement goals questionnaire. These will all be scanned into the patient's chart under media.   During the  visit, I independently reviewed the patient's EKG, bioimpedance scale results, and indirect calorimeter results. I used this information to tailor a meal plan for the patient that will help her to lose weight and will improve her obesity-related conditions going forward. I performed a medically necessary appropriate examination and/or evaluation. I discussed the assessment and treatment plan with the patient. The patient was provided an opportunity to ask questions and all were answered. The patient agreed with the plan and demonstrated an understanding of the instructions. Labs were ordered at this visit and will be reviewed at the next visit unless more critical results need to be addressed immediately. Clinical information was updated and documented in the EMR.   I have reviewed the above documentation for  accuracy and completeness, and I agree with the above. - Coralie Common, MD

## 2021-09-25 ENCOUNTER — Ambulatory Visit (INDEPENDENT_AMBULATORY_CARE_PROVIDER_SITE_OTHER): Payer: Medicare HMO | Admitting: Family Medicine

## 2021-09-25 ENCOUNTER — Encounter (INDEPENDENT_AMBULATORY_CARE_PROVIDER_SITE_OTHER): Payer: Self-pay | Admitting: Family Medicine

## 2021-09-25 ENCOUNTER — Other Ambulatory Visit: Payer: Self-pay

## 2021-09-25 VITALS — BP 129/79 | HR 96 | Temp 98.2°F | Ht 65.0 in | Wt 315.0 lb

## 2021-09-25 DIAGNOSIS — Z6841 Body Mass Index (BMI) 40.0 and over, adult: Secondary | ICD-10-CM | POA: Diagnosis not present

## 2021-09-25 DIAGNOSIS — E669 Obesity, unspecified: Secondary | ICD-10-CM | POA: Diagnosis not present

## 2021-09-25 DIAGNOSIS — E1159 Type 2 diabetes mellitus with other circulatory complications: Secondary | ICD-10-CM

## 2021-09-25 DIAGNOSIS — Z7984 Long term (current) use of oral hypoglycemic drugs: Secondary | ICD-10-CM

## 2021-09-25 DIAGNOSIS — E1169 Type 2 diabetes mellitus with other specified complication: Secondary | ICD-10-CM

## 2021-09-25 DIAGNOSIS — E559 Vitamin D deficiency, unspecified: Secondary | ICD-10-CM | POA: Diagnosis not present

## 2021-09-25 DIAGNOSIS — I152 Hypertension secondary to endocrine disorders: Secondary | ICD-10-CM | POA: Diagnosis not present

## 2021-09-25 DIAGNOSIS — E785 Hyperlipidemia, unspecified: Secondary | ICD-10-CM | POA: Diagnosis not present

## 2021-09-25 DIAGNOSIS — E1165 Type 2 diabetes mellitus with hyperglycemia: Secondary | ICD-10-CM | POA: Diagnosis not present

## 2021-09-25 MED ORDER — VITAMIN D (ERGOCALCIFEROL) 1.25 MG (50000 UNIT) PO CAPS: 50000.0000 [IU] | ORAL_CAPSULE | ORAL | 0 refills | Status: DC

## 2021-09-25 NOTE — Progress Notes (Signed)
Chief Complaint:   OBESITY Autumn Carson is here to discuss her progress with her obesity treatment plan along with follow-up of her obesity related diagnoses. Autumn Carson is on the Category 3 Plan and states she is following her eating plan approximately 10% of the time. Autumn Carson states she is walking 4 miles 2-3 times per week.  Today's visit was #: 2 Starting weight: 314 lbs Starting date: 09/12/2021 Today's weight: 315 lbs Today's date: 09/25/2021 Total lbs lost to date: 0 Total lbs lost since last in-office visit: 0  Interim History: Pt reports minimal appetite and so she doesn't eat much in the morning. Normally, on Monday and Tuesday, she begins eating around 11:30-12 PM. Wed, Thurs, Fri, she is eating oatmeal or grits with water. She then will eat a tuna sandwich (light mayo and bread) or baked fish. Lunch is normally a cup of green teat and crackers or vegetables. Typical supper is okra, lima beans, and corn. Pt doesn't eat meat- steak or chicken. She will eat seafood- shrimp or fish. She also doesn't eat yogurt or drink milk.  Subjective:   1. Hyperlipidemia associated with type 2 diabetes mellitus ([place]) Discussed labs with patient today. Autumn Carson has an LDL of 82, HDL 52, and triglycerides 127. She is on Lipitor with no transaminitis or myalgias.  2. Type 2 diabetes mellitus with hyperglycemia, without long-term current use of insulin ([place]) Discussed labs with patient today. Autumn Carson's A1c is 7.9 with an insulin level of 12.6. She is on Metformin. H/o thyroid cancer.  3. Vitamin D deficiency Discussed labs with patient today. Pt's Vit D level is 13.9. She is not on Rx or OTC Vit D supplementation.  4. Hypertension associated with diabetes ([place]) BP well controlled. Pt is on Norvasc, chlorthalidone, hydralazine, and Bystolic.  Assessment/Plan:   1. Hyperlipidemia associated with type 2 diabetes mellitus ([place]) Cardiovascular risk and specific lipid/LDL goals reviewed.  We discussed  several lifestyle modifications today and Autumn Carson will continue to work on diet, exercise and weight loss efforts. Orders and follow up as documented in patient record. Continue Lipitor with no change in dose. Labs not at goal. Repeat labs in 3 months.  Counseling Intensive lifestyle modifications are the first line treatment for this issue. Dietary changes: Increase soluble fiber. Decrease simple carbohydrates. Exercise changes: Moderate to vigorous-intensity aerobic activity 150 minutes per week if tolerated. Lipid-lowering medications: see documented in medical record.  2. Type 2 diabetes mellitus with hyperglycemia, without long-term current use of insulin (HCC) Good blood sugar control is important to decrease the likelihood of diabetic complications such as nephropathy, neuropathy, limb loss, blindness, coronary artery disease, and death. Intensive lifestyle modification including diet, exercise and weight loss are the first line of treatment for diabetes. Continue Metformin. Due to h/o thyroid cancer, pt can't take GLP-1. Discussed at length SGLT2 and the possibility of starting on, but with pt's erratic eating and significant fasting, risks are elevated. Repeat labs in 3 months.  3. Vitamin D deficiency Low Vitamin D level contributes to fatigue and are associated with obesity, breast, and colon cancer. She agrees to start to take prescription Vitamin D 50,000 IU every week and will follow-up for routine testing of Vitamin D, at least 2-3 times per year to avoid over-replacement.  Start- Vitamin D, Ergocalciferol, (DRISDOL) 1.25 MG (50000 UNIT) CAPS capsule; Take 1 capsule (50,000 Units total) by mouth every 7 (seven) days.  Dispense: 4 capsule; Refill: 0  4. Hypertension associated with diabetes ([place]) Autumn Carson is working on  healthy weight loss and exercise to improve blood pressure control. We will watch for signs of hypotension as she continues her lifestyle modifications. Continue current  treatment plan.  5. Obesity with current BMI of 52.4 Autumn Carson is currently in the action stage of change. As such, her goal is to continue with weight loss efforts. She has agreed to the Category 3 Plan.   Exercise goals: No exercise has been prescribed at this time.  Behavioral modification strategies: increasing lean protein intake, meal planning and cooking strategies, and keeping healthy foods in the home.  Autumn Carson has agreed to follow-up with our clinic in 2 weeks. She was informed of the importance of frequent follow-up visits to maximize her success with intensive lifestyle modifications for her multiple health conditions.   Objective:   Blood pressure 129/79, pulse 96, temperature 98.2 F (36.8 C), height 5\' 5"  (1.651 m), weight (!) 315 lb (142.9 kg), last menstrual period 09/07/2011, SpO2 98 %. Body mass index is 52.42 kg/m.  General: Cooperative, alert, well developed, in no acute distress. HEENT: Conjunctivae and lids unremarkable. Cardiovascular: Regular rhythm.  Lungs: Normal work of breathing. Neurologic: No focal deficits.   Lab Results  Component Value Date   CREATININE 1.10 (H) 09/12/2021   BUN 12 09/12/2021   NA 140 09/12/2021   K 4.0 09/12/2021   CL 97 09/12/2021   CO2 28 09/12/2021   Lab Results  Component Value Date   ALT 27 09/12/2021   AST 29 09/12/2021   ALKPHOS 96 09/12/2021   BILITOT 0.4 09/12/2021   Lab Results  Component Value Date   HGBA1C 7.9 (H) 07/10/2021   HGBA1C 6.7 (H) 02/07/2021   HGBA1C 6.7 (H) 06/06/2020   HGBA1C 6.2 (H) 02/02/2020   HGBA1C 6.4 (H) 09/29/2019   Lab Results  Component Value Date   INSULIN 12.6 09/12/2021   Lab Results  Component Value Date   TSH 2.620 09/12/2021   Lab Results  Component Value Date   CHOL 156 09/12/2021   HDL 52 09/12/2021   LDLCALC 82 09/12/2021   TRIG 127 09/12/2021   CHOLHDL 3.1 02/07/2021   Lab Results  Component Value Date   VD25OH 13.9 (L) 09/12/2021   VD25OH 23 (L) 07/25/2010    VD25OH 17 (L) 11/14/2009   Lab Results  Component Value Date   WBC 5.9 09/12/2021   HGB 11.6 09/12/2021   HCT 36.5 09/12/2021   MCV 79 09/12/2021   PLT 309 09/12/2021   Lab Results  Component Value Date   IRON 55 09/12/2021   TIBC 346 09/12/2021   FERRITIN 30 09/12/2021    Attestation Statements:   Reviewed by clinician on day of visit: allergies, medications, problem list, medical history, surgical history, family history, social history, and previous encounter notes.  Coral Ceo, CMA, am acting as transcriptionist for Coralie Common, MD.  I have reviewed the above documentation for accuracy and completeness, and I agree with the above. - Coralie Common, MD

## 2021-09-26 ENCOUNTER — Other Ambulatory Visit: Payer: Medicare HMO

## 2021-10-02 ENCOUNTER — Encounter: Payer: Self-pay | Admitting: Podiatry

## 2021-10-02 ENCOUNTER — Other Ambulatory Visit: Payer: Self-pay

## 2021-10-02 ENCOUNTER — Ambulatory Visit: Payer: Medicare Other | Admitting: Podiatry

## 2021-10-02 DIAGNOSIS — M79675 Pain in left toe(s): Secondary | ICD-10-CM | POA: Diagnosis not present

## 2021-10-02 DIAGNOSIS — M79674 Pain in right toe(s): Secondary | ICD-10-CM | POA: Diagnosis not present

## 2021-10-02 DIAGNOSIS — B351 Tinea unguium: Secondary | ICD-10-CM

## 2021-10-02 DIAGNOSIS — E1143 Type 2 diabetes mellitus with diabetic autonomic (poly)neuropathy: Secondary | ICD-10-CM

## 2021-10-08 NOTE — Progress Notes (Signed)
?  Subjective:  ?Patient ID: Autumn Carson, female    DOB: [birth year],  MRN: [id number] ? ?Autumn Carson presents to clinic today for at risk foot care with history of diabetic neuropathy and painful elongated mycotic toenails 1-5 bilaterally which are tender when wearing enclosed shoe gear. Pain is relieved with periodic professional debridement. ? ?Patient states blood glucose was 121 mg/dl today.   ? ?Patient did have matrixectomies performed b/l borders right great toe and medial border left great toe over the holidays with Dr. Blenda Mounts and has healed both procedures well. She notes no new pedal problems on today's visit. ? ?New problem(s): None.  ? ?PCP is Gildardo Pounds, NP , and last visit was July 10, 2021. ?Physical Exam ? ?General: Autumn Carson is a pleasant 66 y.o. African American female, morbidly obese in NAD. AAO x 3.  ? ?Vascular:  ?CFT <3 seconds b/l LE. Palpable DP/PT pulses b/l LE. Digital hair present b/l. Skin temperature gradient WNL b/l. No pain with calf compression b/l. No edema noted b/l. No cyanosis or clubbing noted b/l LE.  ? ?Dermatological:  ?Pedal integument with normal turgor, texture and tone BLE. No open wounds b/l LE. No interdigital macerations noted b/l LE. Toenails -5 bilaterally elongated, discolored, dystrophic, thickened, and crumbly with subungual debris and tenderness to dorsal palpation.Procedure site of bilateral great toes noted to be completely healed with no erythema, no edema, no drainage, no purulence.   ? ?Musculoskeletal:  ?Normal muscle strength 5/5 to all lower extremity muscle groups bilaterally. No pain, crepitus or joint limitation noted with ROM b/l LE. No gross bony pedal deformities b/l. Patient ambulates independently without assistive aids.  ? ?Neurological:  ?Pt has subjective symptoms of neuropathy. Protective sensation intact 5/5 intact bilaterally with 10g monofilament b/l. Vibratory sensation intact b/l.  ? ?Allergies   ?Allergen Reactions  ? Lisinopril Anaphylaxis  ?  angioedema  ? Other   ?  Other reaction(s): throat swelling  ? ? ?Review of Systems: Negative except as noted in the HPI. ? ?Objective: ? ?Hemoglobin A1C Latest Ref Rng & Units 07/10/2021 02/07/2021  ?HGBA1C 4.8 - 5.6 % 7.9(H) 6.7(H)  ?Some recent data might be hidden  ? ?Assessment/Plan: ?1. Pain due to onychomycosis of toenails of both feet   ?2. Type 2 diabetes mellitus with diabetic autonomic neuropathy, without long-term current use of insulin ([place])   ?  ?-Examined patient. ?-Patient had matrixectomies of both borders right great toe and medial border left great toe and both have healed. ?-Patient to continue soft, supportive shoe gear daily. ?-Toenails 1-5 b/l were debrided in length and girth with sterile nail nippers and dremel without iatrogenic bleeding.  ?-Patient/POA to call should there be question/concern in the interim.  ? ?Return in about 3 months (around 01/02/2022). ? ?Marzetta Board, DPM  ?

## 2021-10-09 ENCOUNTER — Ambulatory Visit: Payer: Medicare HMO | Admitting: Nurse Practitioner

## 2021-10-22 ENCOUNTER — Emergency Department (HOSPITAL_BASED_OUTPATIENT_CLINIC_OR_DEPARTMENT_OTHER): Payer: Medicare Other

## 2021-10-22 ENCOUNTER — Emergency Department (HOSPITAL_BASED_OUTPATIENT_CLINIC_OR_DEPARTMENT_OTHER)
Admission: EM | Admit: 2021-10-22 | Discharge: 2021-10-22 | Disposition: A | Discharge status: Home / Self Care | Payer: Medicare Other | Attending: Emergency Medicine | Admitting: Emergency Medicine

## 2021-10-22 ENCOUNTER — Encounter (HOSPITAL_BASED_OUTPATIENT_CLINIC_OR_DEPARTMENT_OTHER): Payer: Self-pay | Admitting: Obstetrics and Gynecology

## 2021-10-22 ENCOUNTER — Other Ambulatory Visit: Payer: Self-pay

## 2021-10-22 DIAGNOSIS — E1140 Type 2 diabetes mellitus with diabetic neuropathy, unspecified: Secondary | ICD-10-CM | POA: Insufficient documentation

## 2021-10-22 DIAGNOSIS — E039 Hypothyroidism, unspecified: Secondary | ICD-10-CM | POA: Insufficient documentation

## 2021-10-22 DIAGNOSIS — U071 COVID-19: Secondary | ICD-10-CM | POA: Diagnosis not present

## 2021-10-22 DIAGNOSIS — I110 Hypertensive heart disease with heart failure: Secondary | ICD-10-CM | POA: Diagnosis not present

## 2021-10-22 DIAGNOSIS — Z20822 Contact with and (suspected) exposure to covid-19: Secondary | ICD-10-CM | POA: Diagnosis not present

## 2021-10-22 DIAGNOSIS — J40 Bronchitis, not specified as acute or chronic: Secondary | ICD-10-CM | POA: Diagnosis not present

## 2021-10-22 DIAGNOSIS — Z85850 Personal history of malignant neoplasm of thyroid: Secondary | ICD-10-CM | POA: Diagnosis not present

## 2021-10-22 DIAGNOSIS — R059 Cough, unspecified: Secondary | ICD-10-CM | POA: Diagnosis present

## 2021-10-22 DIAGNOSIS — I509 Heart failure, unspecified: Secondary | ICD-10-CM | POA: Diagnosis not present

## 2021-10-22 LAB — RESP PANEL BY RT-PCR (FLU A&B, COVID) ARPGX2
Influenza A by PCR: NEGATIVE
Influenza B by PCR: NEGATIVE
SARS Coronavirus 2 by RT PCR: NEGATIVE

## 2021-10-22 MED ORDER — BENZONATATE 100 MG PO CAPS: 100.0000 mg | ORAL_CAPSULE | Freq: Three times a day (TID) | ORAL | 0 refills | Status: DC | PRN

## 2021-10-22 MED ORDER — AZITHROMYCIN 250 MG PO TABS: 250.0000 mg | ORAL_TABLET | Freq: Every day | ORAL | 0 refills | Status: DC

## 2021-10-22 MED ORDER — ALBUTEROL SULFATE HFA 108 (90 BASE) MCG/ACT IN AERS: 1.0000 | INHALATION_SPRAY | Freq: Four times a day (QID) | RESPIRATORY_TRACT | 0 refills | Status: DC | PRN

## 2021-10-22 NOTE — ED Provider Notes (Signed)
? ?Emergency Department Provider Note ? ? ?I have reviewed the triage vital signs and the nursing notes. ? ? ?HISTORY ? ?Chief Complaint ?Cough and Covid Positive ? ? ?HPI ?Autumn Carson is a 66 y.o. female with PMH reviewed presents to the ED with 6 days of URI symptoms including cough, congestion, and fatigue. Notes some subjective fever. No vomiting or diarrhea. Patient took a home COVID test 5 days prior which came back positive but did not seek care or start antiviral medications. Cough is slightly productive now and symptoms are lingering which prompted ED evaluation.   ? ? ?Past Medical History:  ?Diagnosis Date  ? Anemia   ? Anxiety   ? Arthritis of both knees   ? Back pain   ? Back pain   ? Bilateral swelling of feet   ? Cancer [facility])   ? Thyroid  ? Carpal tunnel syndrome   ? Chest pain   ? Chronic anemia   ? Congestive heart failure (CHF) ([place])   ? Constipation   ? Depression 1998  ? Essential hypertension   ? Fatty liver   ? GERD (gastroesophageal reflux disease)   ? Glaucoma   ? High cholesterol   ? History of thyroid cancer 2011  ? Papillary thyroid cancer s/p radiation  ? Hypertension   ? Hypothyroidism   ? Joint pain   ? Lumbar spinal stenosis   ? Obesity   ? Osteoarthritis   ? Palpitations   ? PONV (postoperative nausea and vomiting)   ? Rheumatoid arthritis ([place])   ? Seasonal allergies   ? Swallowing difficulty   ? Type 2 diabetes mellitus with diabetic neuropathy (HCC)   ? ? ?Review of Systems ? ?Constitutional: Subjective fever and fatigue.  ?Eyes: No visual changes. ?ENT: No sore throat. Positive congestion.  ?Cardiovascular: Denies chest pain. ?Respiratory: Denies shortness of breath. Positive cough.  ?Gastrointestinal: No abdominal pain.  No nausea, no vomiting.  No diarrhea.  No constipation. ?Genitourinary: Negative for dysuria. ?Musculoskeletal: Negative for back pain. ?Skin: Negative for rash. ?Neurological: Negative for headaches, focal weakness or  numbness. ? ?____________________________________________ ? ? ?PHYSICAL EXAM: ? ?VITAL SIGNS: ?ED Triage Vitals  ?Enc Vitals Group  ?   BP 10/22/21 0749 123/84  ?   Pulse Rate 10/22/21 0749 74  ?   Resp 10/22/21 0749 14  ?   Temp 10/22/21 0749 98.7 ?F (37.1 ?C)  ?   Temp Source 10/22/21 0749 Oral  ?   SpO2 10/22/21 0749 100 %  ?   Weight 10/22/21 0747 (!) 318 lb (144.2 kg)  ?   Height 10/22/21 0747 '5\' 5"'$  (1.651 m)  ? ?Constitutional: Alert and oriented. Well appearing and in no acute distress. ?Eyes: Conjunctivae are normal.  ?Head: Atraumatic. ?Nose: Mild congestion/rhinnorhea. ?Mouth/Throat: Mucous membranes are moist.  Oropharynx non-erythematous. ?Neck: No stridor.   ?Cardiovascular: Normal rate, regular rhythm. Good peripheral circulation. Grossly normal heart sounds.   ?Respiratory: Normal respiratory effort.  No retractions. Lungs CTAB. ?Gastrointestinal: No distention.  ?Musculoskeletal: No lower extremity tenderness nor edema. No gross deformities of extremities. ?Neurologic:  Normal speech and language.  ?Skin:  Skin is warm, dry and intact. No rash noted. ? ?____________________________________________ ?  ?LABS ?(all labs ordered are listed, but only abnormal results are displayed) ? ?Labs Reviewed  ?RESP PANEL BY RT-PCR (FLU A&B, COVID) [id number]  ? ?____________________________________________ ? ?EKG ? ? EKG Interpretation ? ?Date/Time:  Sunday October 22 2021 07:50:38 EDT ?Ventricular Rate:  67 ?PR Interval:  212 ?QRS Duration: 104 ?QT Interval:  420 ?QTC Calculation: 444 ?R Axis:   24 ?Text Interpretation: Sinus rhythm Borderline prolonged PR interval Confirmed by Nanda Quinton [id number]) on 10/22/2021 8:08:37 AM ?  ? ?  ? ?____________________________________________ ? ? ?PROCEDURES ? ?Procedure(s) performed:  ? ?Procedures ? ?None ?____________________________________________ ? ? ?INITIAL IMPRESSION / ASSESSMENT AND PLAN / ED COURSE ? ?Pertinent labs & imaging results that were available during my care of the  patient were reviewed by me and considered in my medical decision making (see chart for details). ?  ?This patient is Presenting for Evaluation of URI, which does require a range of treatment options, and is a complaint that involves a high risk of morbidity and mortality. ? ?The Differential Diagnoses include CAP, COVID, Flu, viral URI, pulmonary edema. ? ?Clinical Laboratory Tests Ordered, included COVID and Flu PCR negative.  ? ?Radiologic Tests Ordered, included CXR. I independently interpreted the images and agree with radiology interpretation.  ? ? ?Social Determinants of Health Risk patient is a non-smoker.  ? ?Medical Decision Making: Summary:  ?Patient presents to the ED with cough/congestion and fatigue. EKG interpreted by me as above. Reports positive home COVID test but PCR negative here for Flu and COVID. Patient outside the 5 day onset to benefit from antivirals. Plan for continued symptoms mgmt. No infiltrate on CXR.  ? ?Reevaluation with update and discussion with patient. Plan for symptom mgmt and PCP follow up. Discussed ED return precautions.  ? ?Disposition: discharge.  ? ?____________________________________________ ? ?FINAL CLINICAL IMPRESSION(S) / ED DIAGNOSES ? ?Final diagnoses:  ?Bronchitis  ? ? ? ?NEW OUTPATIENT MEDICATIONS STARTED DURING THIS VISIT: ? ?Discharge Medication List as of 10/22/2021 10:05 AM  ?  ? ?START taking these medications  ? Details  ?albuterol (VENTOLIN HFA) 108 (90 Base) MCG/ACT inhaler Inhale 1-2 puffs into the lungs every 6 (six) hours as needed for wheezing or shortness of breath., Starting Sun 10/22/2021, Normal  ?  ?azithromycin (ZITHROMAX) 250 MG tablet Take 1 tablet (250 mg total) by mouth daily. Take first 2 tablets together, then 1 every day until finished., Starting Sun 10/22/2021, Normal  ?  ?benzonatate (TESSALON) 100 MG capsule Take 1 capsule (100 mg total) by mouth 3 (three) times daily as needed for cough., Starting Sun 10/22/2021, Normal  ?  ?  ? ? ?Note:   This document was prepared using Dragon voice recognition software and may include unintentional dictation errors. ? ?Nanda Quinton, MD, FACEP ?Emergency Medicine ? ?  ?Margette Fast, MD ?10/25/21 [phone] ? ?

## 2021-10-22 NOTE — ED Triage Notes (Addendum)
Patient reports to the ER for cough. States she took a COVID test on Monday 3/20. Patient reports it was positive. Patient reports she feels ShOB and has significant amount of mucous when she coughs. ?

## 2021-10-22 NOTE — Discharge Instructions (Signed)
You were seen in the emergency room today with cough and congestion.  Your x-ray did not show pneumonia.  Your COVID and flu tests are negative.  I am starting you on antibiotic as well as an albuterol inhaler and cough medication.  Please call your primary care doctor tomorrow to schedule a follow-up appointment in the coming week. ?

## 2021-10-22 NOTE — ED Provider Notes (Signed)
? ?  Emergency Department Provider Note ? ? ?I have reviewed the triage vital signs and the nursing notes. ? ?**Duplicate note** ? ?PROCEDURES ? ?Procedure(s) performed:  ? ?Procedures ? ? ?Note:  This document was prepared using Dragon voice recognition software and may include unintentional dictation errors. ? ?Nanda Quinton, MD, FACEP ?Emergency Medicine ? ?  ?Margette Fast, MD ?10/25/21 [phone] ? ?

## 2021-10-23 ENCOUNTER — Telehealth (INDEPENDENT_AMBULATORY_CARE_PROVIDER_SITE_OTHER): Payer: Medicare Other | Admitting: Family Medicine

## 2021-10-23 ENCOUNTER — Encounter (INDEPENDENT_AMBULATORY_CARE_PROVIDER_SITE_OTHER): Payer: Self-pay | Admitting: Family Medicine

## 2021-10-23 DIAGNOSIS — E1159 Type 2 diabetes mellitus with other circulatory complications: Secondary | ICD-10-CM | POA: Diagnosis not present

## 2021-10-23 DIAGNOSIS — Z6841 Body Mass Index (BMI) 40.0 and over, adult: Secondary | ICD-10-CM

## 2021-10-23 DIAGNOSIS — E559 Vitamin D deficiency, unspecified: Secondary | ICD-10-CM

## 2021-10-23 DIAGNOSIS — I152 Hypertension secondary to endocrine disorders: Secondary | ICD-10-CM | POA: Diagnosis not present

## 2021-10-23 DIAGNOSIS — E669 Obesity, unspecified: Secondary | ICD-10-CM | POA: Diagnosis not present

## 2021-10-23 MED ORDER — VITAMIN D (ERGOCALCIFEROL) 1.25 MG (50000 UNIT) PO CAPS: 50000.0000 [IU] | ORAL_CAPSULE | ORAL | 0 refills | Status: DC

## 2021-10-25 NOTE — Progress Notes (Signed)
? ? ?TeleHealth Visit:  ?Due to the COVID-19 pandemic, this visit was completed with telemedicine (audio/video) technology to reduce patient and provider exposure as well as to preserve personal protective equipment.  ? ?Autumn Carson has verbally consented to this TeleHealth visit. The patient is located at home, the provider is located at the [facility] office. The participants in this visit include the listed provider and patient. The visit was conducted today via MyChart video. ? ? ?Chief Complaint: OBESITY ?Autumn Carson is here to discuss her progress with her obesity treatment plan along with follow-up of her obesity related diagnoses. Autumn Carson is on the Category 3 Plan and states she is following her eating plan approximately 70% of the time. Autumn Carson states she is walking for 40 minutes 2 times per week. ? ?Today's visit was #: 3 ?Starting weight: 314 lbs ?Starting date: 09/12/2021 ? ?Interim History: Autumn Carson has COVID so she is doing a video visit today. She reports a weight of 309 lbs yesterday at the ED. She began getting sick March 19th. She had to go to the ER yesterday due to chest pain/pressure, and was diagnosed with chest infection. She is trying to follow the plan but she is struggling to do so. She is eating quite a bit of [place] tuna but otherwise she doesn't have much of an appetite for some of the food on the plan. Not really eating much protein. ? ?Subjective:  ? ?1. Hypertension associated with diabetes ([place]) ?Autumn Carson's blood pressure has been elevated over the last few months. She has had a recent illness and ED visit. ? ?2. Vitamin D deficiency ?Autumn Carson is on prescription vitamin D. Last Vit D level was of 13.9. She denies nausea, vomiting, or muscle weakness. ? ?Assessment/Plan:  ? ?1. Hypertension associated with diabetes ([place]) ?We will follow up on Autumn Carson's blood pressure at her next appointment. May need medication adjustment if her blood pressure continues to stay elevated. ? ?2.  Vitamin D deficiency ?Autumn Carson will continue prescription Vitamin D, and we will refill for 1 month. ? ?- Vitamin D, Ergocalciferol, (DRISDOL) 1.25 MG (50000 UNIT) CAPS capsule; Take 1 capsule (50,000 Units total) by mouth every 7 (seven) days.  Dispense: 4 capsule; Refill: 0 ? ?3. Obesity with current BMI of 52.4 ?Autumn Carson is currently in the action stage of change. As such, her goal is to continue with weight loss efforts. She has agreed to the Category 3 Plan or the [place] + 300 calories.  ? ?Exercise goals: All adults should avoid inactivity. Some physical activity is better than none, and adults who participate in any amount of physical activity gain some health benefits. ? ?Behavioral modification strategies: increasing lean protein intake, meal planning and cooking strategies, keeping healthy foods in the home, and planning for success. ? ?Autumn Carson has agreed to follow-up with our clinic in 3 weeks. She was informed of the importance of frequent follow-up visits to maximize her success with intensive lifestyle modifications for her multiple health conditions. ? ?Objective:  ? ?VITALS: Per patient if applicable, see vitals. ?GENERAL: Alert and in no acute distress. ?CARDIOPULMONARY: No increased WOB. Speaking in clear sentences.  ?PSYCH: Pleasant and cooperative. Speech normal rate and rhythm. Affect is appropriate. Insight and judgement are appropriate. Attention is focused, linear, and appropriate.  ?NEURO: Oriented as arrived to appointment on time with no prompting.  ? ?Lab Results  ?Component Value Date  ? CREATININE 1.10 (H) 09/12/2021  ? BUN 12 09/12/2021  ? NA 140 09/12/2021  ? K  4.0 09/12/2021  ? CL 97 09/12/2021  ? CO2 28 09/12/2021  ? ?Lab Results  ?Component Value Date  ? ALT 27 09/12/2021  ? AST 29 09/12/2021  ? ALKPHOS 96 09/12/2021  ? BILITOT 0.4 09/12/2021  ? ?Lab Results  ?Component Value Date  ? HGBA1C 7.9 (H) 07/10/2021  ? HGBA1C 6.7 (H) 02/07/2021  ? HGBA1C 6.7 (H) 06/06/2020  ? HGBA1C  6.2 (H) 02/02/2020  ? HGBA1C 6.4 (H) 09/29/2019  ? ?Lab Results  ?Component Value Date  ? INSULIN 12.6 09/12/2021  ? ?Lab Results  ?Component Value Date  ? TSH 2.620 09/12/2021  ? ?Lab Results  ?Component Value Date  ? CHOL 156 09/12/2021  ? HDL 52 09/12/2021  ? [place] 82 09/12/2021  ? TRIG 127 09/12/2021  ? CHOLHDL 3.1 02/07/2021  ? ?Lab Results  ?Component Value Date  ? VD25OH 13.9 (L) 09/12/2021  ? VD25OH 23 (L) 07/25/2010  ? VD25OH 17 (L) 11/14/2009  ? ?Lab Results  ?Component Value Date  ? WBC 5.9 09/12/2021  ? HGB 11.6 09/12/2021  ? HCT 36.5 09/12/2021  ? MCV 79 09/12/2021  ? PLT 309 09/12/2021  ? ?Lab Results  ?Component Value Date  ? IRON 55 09/12/2021  ? TIBC 346 09/12/2021  ? FERRITIN 30 09/12/2021  ? ? ?Attestation Statements:  ? ?Reviewed by clinician on day of visit: allergies, medications, problem list, medical history, surgical history, family history, social history, and previous encounter notes. ? ? ?I, Trixie Dredge, am acting as transcriptionist for Coralie Common, MD. ? ?I have reviewed the above documentation for accuracy and completeness, and I agree with the above. Coralie Common, MD ? ? ?

## 2021-11-08 ENCOUNTER — Other Ambulatory Visit: Payer: Self-pay | Admitting: Family Medicine

## 2021-11-08 DIAGNOSIS — M5136 Other intervertebral disc degeneration, lumbar region: Secondary | ICD-10-CM

## 2021-11-08 DIAGNOSIS — M170 Bilateral primary osteoarthritis of knee: Secondary | ICD-10-CM

## 2021-11-08 DIAGNOSIS — M51369 Other intervertebral disc degeneration, lumbar region without mention of lumbar back pain or lower extremity pain: Secondary | ICD-10-CM

## 2021-11-13 ENCOUNTER — Encounter (INDEPENDENT_AMBULATORY_CARE_PROVIDER_SITE_OTHER): Payer: Self-pay | Admitting: Family Medicine

## 2021-11-13 ENCOUNTER — Ambulatory Visit (INDEPENDENT_AMBULATORY_CARE_PROVIDER_SITE_OTHER): Payer: Medicare Other | Admitting: Family Medicine

## 2021-11-13 VITALS — BP 123/81 | HR 58 | Temp 97.9°F | Ht 65.0 in | Wt 305.0 lb

## 2021-11-13 DIAGNOSIS — J4520 Mild intermittent asthma, uncomplicated: Secondary | ICD-10-CM | POA: Diagnosis not present

## 2021-11-13 DIAGNOSIS — E559 Vitamin D deficiency, unspecified: Secondary | ICD-10-CM

## 2021-11-13 DIAGNOSIS — E1159 Type 2 diabetes mellitus with other circulatory complications: Secondary | ICD-10-CM | POA: Diagnosis not present

## 2021-11-13 DIAGNOSIS — E669 Obesity, unspecified: Secondary | ICD-10-CM

## 2021-11-13 DIAGNOSIS — I152 Hypertension secondary to endocrine disorders: Secondary | ICD-10-CM | POA: Diagnosis not present

## 2021-11-13 DIAGNOSIS — Z6841 Body Mass Index (BMI) 40.0 and over, adult: Secondary | ICD-10-CM

## 2021-11-13 DIAGNOSIS — Z85850 Personal history of malignant neoplasm of thyroid: Secondary | ICD-10-CM | POA: Diagnosis not present

## 2021-11-13 DIAGNOSIS — E890 Postprocedural hypothyroidism: Secondary | ICD-10-CM | POA: Diagnosis not present

## 2021-11-13 MED ORDER — VITAMIN D (ERGOCALCIFEROL) 1.25 MG (50000 UNIT) PO CAPS: 50000.0000 [IU] | ORAL_CAPSULE | ORAL | 0 refills | Status: DC

## 2021-11-13 MED ORDER — ALBUTEROL SULFATE HFA 108 (90 BASE) MCG/ACT IN AERS: 1.0000 | INHALATION_SPRAY | Freq: Four times a day (QID) | RESPIRATORY_TRACT | 0 refills | Status: DC | PRN

## 2021-11-20 DIAGNOSIS — R682 Dry mouth, unspecified: Secondary | ICD-10-CM | POA: Diagnosis not present

## 2021-11-20 DIAGNOSIS — Z85850 Personal history of malignant neoplasm of thyroid: Secondary | ICD-10-CM | POA: Diagnosis not present

## 2021-11-20 DIAGNOSIS — E890 Postprocedural hypothyroidism: Secondary | ICD-10-CM | POA: Diagnosis not present

## 2021-11-25 NOTE — Progress Notes (Signed)
Chief Complaint:   OBESITY Autumn Carson is here to discuss her progress with her obesity treatment plan along with follow-up of her obesity related diagnoses. [name] is on the Category 3 Plan and the [place] + 300 calories and states she is following her eating plan approximately 85% of the time. [name] states she is walking 1 mile 3 times per week.  Today's visit was #: 4 Starting weight: 314 lbs Starting date: 09/12/2021 Today's weight: 305 lbs Today's date: 11/13/2021 Total lbs lost to date: 9 Total lbs lost since last in-office visit: 10  Interim History: Autumn Carson has gone to her sister's, uncle's, and cousin's funerals recently and also had Covid. She is starting to increase walking now that the weather is nice. [name] is eating more on the Category 3 plan with no hunger. She is getting in most of her food, but not all. Autumn Carson is not always hungry at dinner and if she isn't hungry, she won't eat. Autumn Carson is doing a banana, apple, and raisins for snacks. She is sometimes eating an extra apple or 2.  Subjective:   1. Vitamin D deficiency Porsche is on prescription vitamin D. She admits fatigue and denies nausea, vomiting, and muscle weakness.  2. Hypertension associated with diabetes ([place]) Ndea's blood pressure is managed well today.  3. Mild intermittent asthma without complication Autumn Carson is using Albuterol 3 to 4 times per week. She denies side effects of increased shortness of breath with beta blockade.  Assessment/Plan:   1. Vitamin D deficiency Autumn Carson agrees to continue taking prescription vitamin D 50,000IU and will follow up as directed.  - Vitamin D, Ergocalciferol, (DRISDOL) 1.25 MG (50000 UNIT) CAPS capsule; Take 1 capsule (50,000 Units total) by mouth every 7 (seven) days.  Dispense: 4 capsule; Refill: 0  2. Hypertension associated with diabetes ([place]) [name] agrees to continue taking her current medicines with no change in medication or doses.  3. Mild  intermittent asthma without complication [name] agrees to continue using albuterol as needed and will follow up as directed.  - albuterol (VENTOLIN HFA) 108 (90 Base) MCG/ACT inhaler; Inhale 1-2 puffs into the lungs every 6 (six) hours as needed for wheezing or shortness of breath.  Dispense: 6.7 g; Refill: 0  4. Obesity with current BMI of 50.8 [name] is currently in the action stage of change. As such, her goal is to continue with weight loss efforts. She has agreed to the Category 3 Plan.   Exercise goals: All adults should avoid inactivity. Some physical activity is better than none, and adults who participate in any amount of physical activity gain some health benefits.  Behavioral modification strategies: increasing lean protein intake, meal planning and cooking strategies, keeping healthy foods in the home, and planning for success.  Autumn Carson has agreed to follow-up with our clinic in 4 weeks. She was informed of the importance of frequent follow-up visits to maximize her success with intensive lifestyle modifications for her multiple health conditions.   Objective:   Blood pressure 123/81, pulse (!) 58, temperature 97.9 F (36.6 C), height '5\' 5"'$  (1.651 m), weight (!) 305 lb (138.3 kg), last menstrual period 09/07/2011, SpO2 97 %. Body mass index is 50.75 kg/m.  General: Cooperative, alert, well developed, in no acute distress. HEENT: Conjunctivae and lids unremarkable. Cardiovascular: Regular rhythm.  Lungs: Normal work of breathing. Neurologic: No focal deficits.   Lab Results  Component Value Date   CREATININE 1.10 (H) 09/12/2021   BUN 12 09/12/2021   NA 140  09/12/2021   K 4.0 09/12/2021   CL 97 09/12/2021   CO2 28 09/12/2021   Lab Results  Component Value Date   ALT 27 09/12/2021   AST 29 09/12/2021   ALKPHOS 96 09/12/2021   BILITOT 0.4 09/12/2021   Lab Results  Component Value Date   HGBA1C 7.9 (H) 07/10/2021   HGBA1C 6.7 (H) 02/07/2021   HGBA1C 6.7 (H)  06/06/2020   HGBA1C 6.2 (H) 02/02/2020   HGBA1C 6.4 (H) 09/29/2019   Lab Results  Component Value Date   INSULIN 12.6 09/12/2021   Lab Results  Component Value Date   TSH 2.620 09/12/2021   Lab Results  Component Value Date   CHOL 156 09/12/2021   HDL 52 09/12/2021   LDLCALC 82 09/12/2021   TRIG 127 09/12/2021   CHOLHDL 3.1 02/07/2021   Lab Results  Component Value Date   VD25OH 13.9 (L) 09/12/2021   VD25OH 23 (L) 07/25/2010   VD25OH 17 (L) 11/14/2009   Lab Results  Component Value Date   WBC 5.9 09/12/2021   HGB 11.6 09/12/2021   HCT 36.5 09/12/2021   MCV 79 09/12/2021   PLT 309 09/12/2021   Lab Results  Component Value Date   IRON 55 09/12/2021   TIBC 346 09/12/2021   FERRITIN 30 09/12/2021   Attestation Statements:   Reviewed by clinician on day of visit: allergies, medications, problem list, medical history, surgical history, family history, social history, and previous encounter notes.  IMarcille Blanco, CMA, am acting as transcriptionist for Coralie Common, MD  I have reviewed the above documentation for accuracy and completeness, and I agree with the above. - Coralie Common, MD

## 2021-11-27 ENCOUNTER — Ambulatory Visit: Payer: Medicare Other | Attending: Nurse Practitioner | Admitting: Nurse Practitioner

## 2021-11-27 VITALS — BP 101/65 | HR 63 | Temp 98.5°F | Resp 12 | Wt 311.0 lb

## 2021-11-27 DIAGNOSIS — E890 Postprocedural hypothyroidism: Secondary | ICD-10-CM

## 2021-11-27 DIAGNOSIS — R519 Headache, unspecified: Secondary | ICD-10-CM

## 2021-11-27 DIAGNOSIS — F418 Other specified anxiety disorders: Secondary | ICD-10-CM

## 2021-11-27 DIAGNOSIS — E118 Type 2 diabetes mellitus with unspecified complications: Secondary | ICD-10-CM

## 2021-11-27 DIAGNOSIS — K219 Gastro-esophageal reflux disease without esophagitis: Secondary | ICD-10-CM

## 2021-11-27 DIAGNOSIS — E785 Hyperlipidemia, unspecified: Secondary | ICD-10-CM | POA: Diagnosis not present

## 2021-11-27 DIAGNOSIS — I10 Essential (primary) hypertension: Secondary | ICD-10-CM

## 2021-11-27 LAB — POCT GLYCOSYLATED HEMOGLOBIN (HGB A1C): HbA1c, POC (controlled diabetic range): 6.8 % (ref 0.0–7.0)

## 2021-11-27 LAB — GLUCOSE, POCT (MANUAL RESULT ENTRY): POC Glucose: 93 mg/dl (ref 70–99)

## 2021-11-27 MED ORDER — FLUOXETINE HCL 40 MG PO CAPS: 40.0000 mg | ORAL_CAPSULE | Freq: Every day | ORAL | 1 refills | Status: DC

## 2021-11-27 MED ORDER — OMEPRAZOLE 40 MG PO CPDR: 40.0000 mg | DELAYED_RELEASE_CAPSULE | Freq: Every day | ORAL | 3 refills | Status: DC

## 2021-11-27 MED ORDER — NEBIVOLOL HCL 10 MG PO TABS: 10.0000 mg | ORAL_TABLET | Freq: Every day | ORAL | 1 refills | Status: DC

## 2021-11-27 MED ORDER — AMITRIPTYLINE HCL 10 MG PO TABS: 10.0000 mg | ORAL_TABLET | Freq: Every day | ORAL | 1 refills | Status: DC

## 2021-11-27 MED ORDER — LEVOTHYROXINE SODIUM 175 MCG PO TABS: 175.0000 ug | ORAL_TABLET | Freq: Every day | ORAL | 0 refills | Status: DC

## 2021-11-27 MED ORDER — ATORVASTATIN CALCIUM 40 MG PO TABS: 40.0000 mg | ORAL_TABLET | Freq: Every day | ORAL | 3 refills | Status: DC

## 2021-11-27 MED ORDER — METFORMIN HCL 1000 MG PO TABS: 1000.0000 mg | ORAL_TABLET | Freq: Two times a day (BID) | ORAL | 1 refills | Status: DC

## 2021-11-27 MED ORDER — HYDRALAZINE HCL 10 MG PO TABS: 10.0000 mg | ORAL_TABLET | Freq: Three times a day (TID) | ORAL | 3 refills | Status: AC

## 2021-11-27 MED ORDER — CHLORTHALIDONE 25 MG PO TABS: 25.0000 mg | ORAL_TABLET | Freq: Every day | ORAL | 1 refills | Status: DC

## 2021-11-27 NOTE — Progress Notes (Signed)
Assessment & Plan:  Michol was seen today for diabetes.  Diagnoses and all orders for this visit:  Controlled type 2 diabetes mellitus with complication, without long-term current use of insulin (HCC) Great control of Diabetes -     HgB A1c -     Glucose (CBG) -     metFORMIN (GLUCOPHAGE) 1000 MG tablet; Take 1 tablet (1,000 mg total) by mouth 2 (two) times daily with a meal.  Essential hypertension Blood pressure at goal  -     hydrALAZINE (APRESOLINE) 10 MG tablet; Take 1 tablet (10 mg total) by mouth 3 (three) times daily. -     chlorthalidone (HYGROTON) 25 MG tablet; Take 1 tablet (25 mg total) by mouth daily. -     nebivolol (BYSTOLIC) 10 MG tablet; Take 1 tablet (10 mg total) by mouth daily.  Occipital headache -     amitriptyline (ELAVIL) 10 MG tablet; Take 1 tablet (10 mg total) by mouth at bedtime.  Dyslipidemia, goal LDL below 70 -     atorvastatin (LIPITOR) 40 MG tablet; Take 1 tablet (40 mg total) by mouth daily. INSTRUCTIONS: Work on a low fat, heart healthy diet and participate in regular aerobic exercise program by working out at least 150 minutes per week; 5 days a week-30 minutes per day. Avoid red meat/beef/steak,  fried foods. junk foods, sodas, sugary drinks, unhealthy snacking, alcohol and smoking.  Drink at least 80 oz of water per day and monitor your carbohydrate intake daily.    Depression with anxiety -     FLUoxetine (PROZAC) 40 MG capsule; Take 1 capsule (40 mg total) by mouth daily.  Postoperative hypothyroidism -     levothyroxine (SYNTHROID) 175 MCG tablet; Take 1 tablet (175 mcg total) by mouth daily before breakfast.  Gastroesophageal reflux disease without esophagitis -     omeprazole (PRILOSEC) 40 MG capsule; Take 1 capsule (40 mg total) by mouth daily. INSTRUCTIONS: Avoid GERD Triggers: acidic, spicy or fried foods, caffeine, coffee, sodas,  alcohol and chocolate.      Patient has been counseled on age-appropriate routine health concerns for  screening and prevention. These are reviewed and up-to-date. Referrals have been placed accordingly. Immunizations are up-to-date or declined.    Subjective:   Chief Complaint  Patient presents with   Diabetes   HPI Autumn Carson 66 y.o. female presents to office today for follow up to her chronic medical conditions She has a past medical history of Anxiety, Arthritis of both knees, Back pain, THYROID Cancer with post surgical hypothyroidism (followed by Endocrinology Dr. Sharl Ma), Chronic anemia, Depression (1998), Essential hypertension, GERD, History of thyroid cancer (2011), Hypothyroidism, Lumbar spinal stenosis, MORBID Obesity, PONV, Seasonal allergies, and Type 2 diabetes mellitus with diabetic neuropathy.    Morbid Obesity She is being followed by Dr. Lawson Radar to assist her with healthy weight loss. Having difficulty eating small frequent meals. Needs to move more but activity limited with joint arthralgias   HTN Currently taking Needs refills on amlodipine 10 mg daily, chlorthalidone 25 mg daily, hydralazine 10 mg TID and bystolic 10 mg daily. Blood pressure is well controlled.  BP Readings from Last 3 Encounters:  11/27/21 101/65  11/13/21 123/81  10/22/21 (!) 156/67    DM 2 Well controlled iwht metofmrin 1000 mg bid. Complications of diabetes include peripheral neuropathy for which she takes lyrica.  Lab Results  Component Value Date   HGBA1C 6.8 11/27/2021  LDL not at goal with atorvastatin 40 mg. Likely related to  dietary nonadherence.  Lab Results  Component Value Date   LDLCALC 82 09/12/2021      Review of Systems  Constitutional:  Negative for fever, malaise/fatigue and weight loss.  HENT: Negative.  Negative for nosebleeds.   Eyes: Negative.  Negative for blurred vision, double vision and photophobia.  Respiratory: Negative.  Negative for cough and shortness of breath.   Cardiovascular: Negative.  Negative for chest pain, palpitations and leg swelling.   Gastrointestinal:  Positive for heartburn. Negative for nausea and vomiting.  Musculoskeletal: Negative.  Negative for myalgias.  Neurological:  Positive for headaches (chronic). Negative for dizziness, focal weakness and seizures.  Psychiatric/Behavioral: Negative.  Negative for suicidal ideas.    Past Medical History:  Diagnosis Date   Anemia    Anxiety    Arthritis of both knees    Back pain    Back pain    Bilateral swelling of feet    Cancer (HCC)    Thyroid   Carpal tunnel syndrome    Chest pain    Chronic anemia    Congestive heart failure (CHF) (HCC)    Constipation    Depression 1998   Essential hypertension    Fatty liver    GERD (gastroesophageal reflux disease)    Glaucoma    High cholesterol    History of thyroid cancer 2011   Papillary thyroid cancer s/p radiation   Hypertension    Hypothyroidism    Joint pain    Lumbar spinal stenosis    Obesity    Osteoarthritis    Palpitations    PONV (postoperative nausea and vomiting)    Rheumatoid arthritis (HCC)    Seasonal allergies    Swallowing difficulty    Type 2 diabetes mellitus with diabetic neuropathy (HCC)     Past Surgical History:  Procedure Laterality Date   BACK SURGERY     BIOPSY N/A 01/16/2013   Procedure: BIOPSY;  Surgeon: West Bali, MD;  [facility];  Service: Endoscopy;  Laterality: N/A;  SMALL BOWEL ULCERS   BRAVO PH STUDY N/A 11/22/2015   Procedure: BRAVO PH STUDY;  Surgeon: West Bali, MD;  [facility]: [facility];  Service: Endoscopy;  Laterality: N/A;   CARPAL TUNNEL RELEASE     4 total, bilaterally   CESAREAN SECTION     CHOLECYSTECTOMY  1990's   COLONOSCOPY WITH ESOPHAGOGASTRODUODENOSCOPY (EGD) N/A 12/08/2012   SLF: 1. Normal mucosa in the terminal ileum 2. mild diverticulosis throughout the entire examined colon. 3. rectal bleeding due to moderate sized internal hemorrhoids.    ELBOW SURGERY Bilateral    ENTEROSCOPY N/A 01/16/2013   Procedure: ENTEROSCOPY;   Surgeon: West Bali, MD;  [facility];  Service: Endoscopy;  Laterality: N/A;  2:00   ESOPHAGOGASTRODUODENOSCOPY N/A 11/22/2015   Procedure: ESOPHAGOGASTRODUODENOSCOPY (EGD);  Surgeon: West Bali, MD;  [facility];  Service: Endoscopy;  Laterality: N/A;  130 - moved to 2:00 - office to notify pt   FOOT SURGERY Left 1999   bone spur   GIVENS CAPSULE STUDY N/A 12/31/2012   Procedure: GIVENS CAPSULE STUDY;  Surgeon: West Bali, MD;  [facility];  Service: Endoscopy;  Laterality: N/A;  7:30   ROTATOR CUFF REPAIR Bilateral    bilaterally   SAVORY DILATION N/A 11/22/2015   Procedure: SAVORY DILATION;  Surgeon: West Bali, MD;  [facility];  Service: Endoscopy;  Laterality: N/A;   SPINE SURGERY  Lumbar Fusion- Dr. Dutch Quint   TONSILECTOMY, ADENOIDECTOMY, BILATERAL MYRINGOTOMY AND TUBES  1987   TONSILLECTOMY  1987   TOTAL THYROIDECTOMY  2011   TUBAL LIGATION      Family History  Problem Relation Age of Onset   Stroke Mother    Hyperlipidemia Mother    Hypertension Mother    Heart disease Mother    Sudden death Mother    Alcoholism Mother    Cancer Father    Heart disease Father    Hyperlipidemia Father    Lung cancer Father        Age 87   Colon cancer Brother        Age 20   Other Brother        Agent orange, age 30, cancer   Heart attack Daughter 65   Hypertension Daughter    Breast cancer Cousin     Social History Reviewed with no changes to be made today.   Outpatient Medications Prior to Visit  Medication Sig Dispense Refill   albuterol (VENTOLIN HFA) 108 (90 Base) MCG/ACT inhaler Inhale 1-2 puffs into the lungs every 6 (six) hours as needed for wheezing or shortness of breath. 6.7 g 0   amLODipine (NORVASC) 10 MG tablet TAKE 1 TABLET EVERY DAY 90 tablet 3   aspirin 81 MG tablet Take 81 mg by mouth every other day.     Blood Glucose Monitoring Suppl (TRUE METRIX AIR GLUCOSE METER) w/Device KIT USE AS DIRECTED  1 kit 0   celecoxib (CELEBREX) 100 MG capsule TAKE 1 CAPSULE(100 MG) BY MOUTH TWICE DAILY AS NEEDED FOR MODERATE PAIN 60 capsule 0   ciclopirox (PENLAC) 8 % solution Apply one coat to toenail qd for 48 weeks.  Remove weekly with polish remover. 6.6 mL 11   cyclobenzaprine (FLEXERIL) 10 MG tablet 1 tablet as needed     diclofenac sodium (VOLTAREN) 1 % GEL Apply dime size to knees three times a day as needed (Patient taking differently: Apply 1 application. topically 3 (three) times daily as needed (pain). Apply dime size to knees three times a day as needed) 100 g 3   pregabalin (LYRICA) 50 MG capsule TAKE 1 CAPSULE AT BEDTIME 30 capsule 0   TRUE METRIX BLOOD GLUCOSE TEST test strip CHECK FINGER STICK BLOOD SUGAR TWICE DAILY AS DIRECTED 200 strip 3   Vitamin D, Ergocalciferol, (DRISDOL) 1.25 MG (50000 UNIT) CAPS capsule Take 1 capsule (50,000 Units total) by mouth every 7 (seven) days. 4 capsule 0   zolpidem (AMBIEN) 5 MG tablet TAKE 1 TABLET EVERY NIGHT AT BEDTIME AS NEEDED FOR SLEEP 30 tablet 3   amitriptyline (ELAVIL) 10 MG tablet TAKE 1 TABLET AT BEDTIME 90 tablet 0   atorvastatin (LIPITOR) 40 MG tablet Take 1 tablet (40 mg total) by mouth daily. 90 tablet 3   chlorthalidone (HYGROTON) 25 MG tablet TAKE 1 TABLET EVERY DAY 90 tablet 0   FLUoxetine (PROZAC) 40 MG capsule TAKE 1 CAPSULE EVERY DAY BEFORE BREAKFAST 90 capsule 1   hydrALAZINE (APRESOLINE) 10 MG tablet Take 1 tablet (10 mg total) by mouth 3 (three) times daily. 270 tablet 3   levothyroxine (SYNTHROID) 175 MCG tablet Take 175 mcg by mouth daily before breakfast.     nebivolol (BYSTOLIC) 10 MG tablet TAKE 1 TABLET EVERY DAY 90 tablet 0   omeprazole (PRILOSEC) 40 MG capsule TAKE 1 CAPSULE TWICE DAILY  30  MINUTES  PRIOR  TO  MEALS 180 capsule 3   azithromycin (ZITHROMAX) 250  MG tablet Take 1 tablet (250 mg total) by mouth daily. Take first 2 tablets together, then 1 every day until finished. (Patient not taking: Reported on 11/27/2021) 6  tablet 0   benzonatate (TESSALON) 100 MG capsule Take 1 capsule (100 mg total) by mouth 3 (three) times daily as needed for cough. (Patient not taking: Reported on 11/27/2021) 21 capsule 0   metFORMIN (GLUCOPHAGE) 1000 MG tablet Take 1 tablet (1,000 mg total) by mouth 2 (two) times daily with a meal. 180 tablet 1   No facility-administered medications prior to visit.    Allergies  Allergen Reactions   Lisinopril Anaphylaxis    angioedema   Other     Other reaction(s): throat swelling       Objective:    BP 101/65   Pulse 63   Temp 98.5 F (36.9 C) (Oral)   Resp 12   Wt (!) 311 lb (141.1 kg)   LMP 09/07/2011   SpO2 96%   BMI 51.75 kg/m  Wt Readings from Last 3 Encounters:  11/27/21 (!) 311 lb (141.1 kg)  11/13/21 (!) 305 lb (138.3 kg)  10/22/21 (!) 318 lb (144.2 kg)    Physical Exam Vitals and nursing note reviewed.  Constitutional:      Appearance: She is well-developed. She is obese.  HENT:     Head: Normocephalic and atraumatic.  Cardiovascular:     Rate and Rhythm: Normal rate and regular rhythm.     Heart sounds: Normal heart sounds. No murmur heard.   No friction rub. No gallop.  Pulmonary:     Effort: Pulmonary effort is normal. No tachypnea or respiratory distress.     Breath sounds: Normal breath sounds. No decreased breath sounds, wheezing, rhonchi or rales.  Chest:     Chest wall: No tenderness.  Abdominal:     General: Bowel sounds are normal.     Palpations: Abdomen is soft.  Musculoskeletal:        General: Normal range of motion.     Cervical back: Normal range of motion.  Skin:    General: Skin is warm and dry.  Neurological:     Mental Status: She is alert and oriented to person, place, and time.     Coordination: Coordination normal.  Psychiatric:        Behavior: Behavior normal. Behavior is cooperative.        Thought Content: Thought content normal.        Judgment: Judgment normal.         Patient has been counseled extensively  about nutrition and exercise as well as the importance of adherence with medications and regular follow-up. The patient was given clear instructions to go to ER or return to medical center if symptoms don't improve, worsen or new problems develop. The patient verbalized understanding.   Follow-up: Return in about 6 months (around 05/30/2022).   Claiborne Rigg, FNP-BC [facility] [place], [place] [phone]   11/27/2021, 8:53 PM

## 2021-11-28 ENCOUNTER — Encounter: Payer: Self-pay | Admitting: Nurse Practitioner

## 2021-12-08 ENCOUNTER — Other Ambulatory Visit: Payer: Self-pay | Admitting: Family Medicine

## 2021-12-08 DIAGNOSIS — M170 Bilateral primary osteoarthritis of knee: Secondary | ICD-10-CM

## 2021-12-08 DIAGNOSIS — M5136 Other intervertebral disc degeneration, lumbar region: Secondary | ICD-10-CM

## 2021-12-08 LAB — HM DIABETES EYE EXAM

## 2021-12-11 ENCOUNTER — Ambulatory Visit (INDEPENDENT_AMBULATORY_CARE_PROVIDER_SITE_OTHER): Payer: Medicare Other | Admitting: Family Medicine

## 2021-12-11 ENCOUNTER — Encounter (INDEPENDENT_AMBULATORY_CARE_PROVIDER_SITE_OTHER): Payer: Self-pay | Admitting: Family Medicine

## 2021-12-11 VITALS — BP 141/83 | HR 68 | Temp 98.1°F | Ht 65.0 in | Wt 299.0 lb

## 2021-12-11 DIAGNOSIS — E559 Vitamin D deficiency, unspecified: Secondary | ICD-10-CM | POA: Diagnosis not present

## 2021-12-11 DIAGNOSIS — Z6841 Body Mass Index (BMI) 40.0 and over, adult: Secondary | ICD-10-CM

## 2021-12-11 DIAGNOSIS — E1165 Type 2 diabetes mellitus with hyperglycemia: Secondary | ICD-10-CM | POA: Diagnosis not present

## 2021-12-11 DIAGNOSIS — Z7984 Long term (current) use of oral hypoglycemic drugs: Secondary | ICD-10-CM | POA: Diagnosis not present

## 2021-12-11 DIAGNOSIS — E669 Obesity, unspecified: Secondary | ICD-10-CM | POA: Diagnosis not present

## 2021-12-11 MED ORDER — VITAMIN D (ERGOCALCIFEROL) 1.25 MG (50000 UNIT) PO CAPS: 50000.0000 [IU] | ORAL_CAPSULE | ORAL | 0 refills | Status: DC

## 2021-12-12 NOTE — Progress Notes (Signed)
Chief Complaint:   OBESITY Autumn Carson is here to discuss her progress with her obesity treatment plan along with follow-up of her obesity related diagnoses. Autumn Carson is on the Category 3 Plan and states she is following her eating plan approximately 15% of the time. Autumn Carson states she is walking for 1 mile 2-3 times per week.  Today's visit was #: 5 Starting weight: 314 lbs Starting date: 2/14/202 Today's weight: 299 lbs Today's date: 12/11/2021 Total lbs lost to date: 15 lbs Total lbs lost since last in-office visit: 6 lbs  Interim History: Autumn Carson has been drinking more water than usual and trying to add walking in daily. She occasionally still skipping meals. She will eat peanut butter cracker if she isn't hungry. She will also do cranberry juice with water. She may occasionally eat cereal but is focusing on calories plus carbohydrates.   Subjective:   1. Type 2 diabetes mellitus with hyperglycemia, without long-term current use of insulin (HCC) Carlisia's last A1C was 6.8 (improved from 7.9). She is not eating much meat but sounds to be taking in majority of carbohydrates in comparison to protein.   2. Vitamin D deficiency Autumn Carson is currently on prescription Vitamin D.   Assessment/Plan:   1. Type 2 diabetes mellitus with hyperglycemia, without long-term current use of insulin (HCC) Autumn Carson will continue Metformin with no change in dose. Calories to protein ratio discussed today. Good blood sugar control is important to decrease the likelihood of diabetic complications such as nephropathy, neuropathy, limb loss, blindness, coronary artery disease, and death. Intensive lifestyle modification including diet, exercise and weight loss are the first line of treatment for diabetes.   2. Vitamin D deficiency Low Vitamin D level contributes to fatigue and are associated with obesity, breast, and colon cancer. We will refill prescription Vitamin D 50,000 IU every week for 1 month with no refills  and Autumn Carson will follow-up for routine testing of Vitamin D, at least 2-3 times per year to avoid over-replacement.  - Vitamin D, Ergocalciferol, (DRISDOL) 1.25 MG (50000 UNIT) CAPS capsule; Take 1 capsule (50,000 Units total) by mouth every 7 (seven) days.  Dispense: 4 capsule; Refill: 0  3. Obesity with current BMI of 49.9 Eraina is currently in the action stage of change. As such, her goal is to continue with weight loss efforts. She has agreed to the Category 3 Plan.   We discussed protein calorie, ratio today; Autumn Carson was encouraged to stop skipping meals.   Exercise goals:  As is.   Behavioral modification strategies: increasing lean protein intake, no skipping meals, meal planning and cooking strategies, keeping healthy foods in the home, and planning for success.  Autumn Carson has agreed to follow-up with our clinic in 3 weeks. She was informed of the importance of frequent follow-up visits to maximize her success with intensive lifestyle modifications for her multiple health conditions.   Objective:   Blood pressure (!) 141/83, pulse 68, temperature 98.1 F (36.7 C), height '5\' 5"'$  (1.651 m), weight 299 lb (135.6 kg), last menstrual period 09/07/2011, SpO2 97 %. Body mass index is 49.76 kg/m.  General: Cooperative, alert, well developed, in no acute distress. HEENT: Conjunctivae and lids unremarkable. Cardiovascular: Regular rhythm.  Lungs: Normal work of breathing. Neurologic: No focal deficits.   Lab Results  Component Value Date   CREATININE 1.10 (H) 09/12/2021   BUN 12 09/12/2021   NA 140 09/12/2021   K 4.0 09/12/2021   CL 97 09/12/2021   CO2 28 09/12/2021  Lab Results  Component Value Date   ALT 27 09/12/2021   AST 29 09/12/2021   ALKPHOS 96 09/12/2021   BILITOT 0.4 09/12/2021   Lab Results  Component Value Date   HGBA1C 6.8 11/27/2021   HGBA1C 7.9 (H) 07/10/2021   HGBA1C 6.7 (H) 02/07/2021   HGBA1C 6.7 (H) 06/06/2020   HGBA1C 6.2 (H) 02/02/2020   Lab Results   Component Value Date   INSULIN 12.6 09/12/2021   Lab Results  Component Value Date   TSH 2.620 09/12/2021   Lab Results  Component Value Date   CHOL 156 09/12/2021   HDL 52 09/12/2021   LDLCALC 82 09/12/2021   TRIG 127 09/12/2021   CHOLHDL 3.1 02/07/2021   Lab Results  Component Value Date   VD25OH 13.9 (L) 09/12/2021   VD25OH 23 (L) 07/25/2010   VD25OH 17 (L) 11/14/2009   Lab Results  Component Value Date   WBC 5.9 09/12/2021   HGB 11.6 09/12/2021   HCT 36.5 09/12/2021   MCV 79 09/12/2021   PLT 309 09/12/2021   Lab Results  Component Value Date   IRON 55 09/12/2021   TIBC 346 09/12/2021   FERRITIN 30 09/12/2021   Attestation Statements:   Reviewed by clinician on day of visit: allergies, medications, problem list, medical history, surgical history, family history, social history, and previous encounter notes.  I, Lizbeth Bark, RMA, am acting as transcriptionist for Coralie Common, MD.   I have reviewed the above documentation for accuracy and completeness, and I agree with the above. - Coralie Common, MD

## 2022-01-01 ENCOUNTER — Encounter (INDEPENDENT_AMBULATORY_CARE_PROVIDER_SITE_OTHER): Payer: Self-pay | Admitting: Nurse Practitioner

## 2022-01-01 ENCOUNTER — Ambulatory Visit (INDEPENDENT_AMBULATORY_CARE_PROVIDER_SITE_OTHER): Payer: Medicare Other | Admitting: Nurse Practitioner

## 2022-01-01 VITALS — BP 138/81 | HR 52 | Temp 98.2°F | Ht 65.0 in | Wt 299.0 lb

## 2022-01-01 DIAGNOSIS — E669 Obesity, unspecified: Secondary | ICD-10-CM | POA: Diagnosis not present

## 2022-01-01 DIAGNOSIS — Z7984 Long term (current) use of oral hypoglycemic drugs: Secondary | ICD-10-CM

## 2022-01-01 DIAGNOSIS — Z6841 Body Mass Index (BMI) 40.0 and over, adult: Secondary | ICD-10-CM | POA: Diagnosis not present

## 2022-01-01 DIAGNOSIS — E559 Vitamin D deficiency, unspecified: Secondary | ICD-10-CM | POA: Diagnosis not present

## 2022-01-01 DIAGNOSIS — E1165 Type 2 diabetes mellitus with hyperglycemia: Secondary | ICD-10-CM | POA: Diagnosis not present

## 2022-01-01 MED ORDER — VITAMIN D (ERGOCALCIFEROL) 1.25 MG (50000 UNIT) PO CAPS: 50000.0000 [IU] | ORAL_CAPSULE | ORAL | 0 refills | Status: DC

## 2022-01-02 NOTE — Progress Notes (Unsigned)
Chief Complaint:   OBESITY Autumn Carson is here to discuss her progress with her obesity treatment plan along with follow-up of her obesity related diagnoses. Autumn Carson is on the Category 3 Plan and states she is following her eating plan approximately 30% of the time. Autumn Carson states she is walking and using the stationary bike for 15-35 minutes 2-3 times per week.  Today's visit was #: 6 Starting weight: 314 lbs Starting date: 09/12/2020 Today's weight: 299 lbs Today's date: 01/01/2022 Total lbs lost to date: 15 lbs Total lbs lost since last in-office visit: 0  Interim History: Autumn Carson states "I don't eat like I should". She is skipping meals. She eats out once per day because she's not hungry. She eats 1-2 meals and 2 snacks per day. She rarely eats meat. Breakfast: Oatmeal and banana. Dinner: Rice and tomato. She drinks water, ginger ale, and coffee. She is not following meal plan due to not eating meat. She tends to eat carbohydrates. She sometimes will drink a protein shake. She wants to lose weight to have knee surgery.   Subjective:   1. Type 2 diabetes mellitus with hyperglycemia, without long-term current use of insulin (HCC) Autumn Carson's last A1C was 6.3 on 11/27/2021. She is taking Metformin 1000 mg twice daily. She denies side effects.   2. Vitamin D deficiency Autumn Carson is currently taking prescription vitamin D 50,000 IU each week. She denies nausea, vomiting or muscle weakness. Her last Dexa was 08/15/2021.  Assessment/Plan:   1. Type 2 diabetes mellitus with hyperglycemia, without long-term current use of insulin (HCC) Autumn Carson will continue to follow up with her primary care provider. She will continue medications as directed. Good blood sugar control is important to decrease the likelihood of diabetic complications such as nephropathy, neuropathy, limb loss, blindness, coronary artery disease, and death. Intensive lifestyle modification including diet, exercise and weight loss are the first  line of treatment for diabetes.   2. Vitamin D deficiency Low Vitamin D level contributes to fatigue and are associated with obesity, breast, and colon cancer. We will refill prescription Vitamin D 50,000 IU every week for 1 month with no refills and Autumn Carson will follow-up for routine testing of Vitamin D, at least 2-3 times per year to avoid over-replacement. We discussed side effects.   - Vitamin D, Ergocalciferol, (DRISDOL) 1.25 MG (50000 UNIT) CAPS capsule; Take 1 capsule (50,000 Units total) by mouth every 7 (seven) days.  Dispense: 4 capsule; Refill: 0  3. Obesity with current BMI of 49.8 Autumn Carson is currently in the action stage of change. As such, her goal is to continue with weight loss efforts. She has agreed to the [employer].   We will check labs at next visit Vitamin D and CMP.   Exercise goals:  As is.   Behavioral modification strategies: increasing lean protein intake, increasing water intake, no skipping meals, and meal planning and cooking strategies.  Autumn Carson has agreed to follow-up with our clinic in {NUMBER 1-10:[id number]} weeks. She was informed of the importance of frequent follow-up visits to maximize her success with intensive lifestyle modifications for her multiple health conditions.   Objective:   Blood pressure 138/81, pulse (!) 52, temperature 98.2 F (36.8 C), height '5\' 5"'$  (1.651 m), weight 299 lb (135.6 kg), last menstrual period 09/07/2011, SpO2 99 %. Body mass index is 49.76 kg/m.  General: Cooperative, alert, well developed, in no acute distress. HEENT: Conjunctivae and lids unremarkable. Cardiovascular: Regular rhythm.  Lungs: Normal work of breathing. Neurologic: No focal  deficits.   Lab Results  Component Value Date   CREATININE 1.10 (H) 09/12/2021   BUN 12 09/12/2021   NA 140 09/12/2021   K 4.0 09/12/2021   CL 97 09/12/2021   CO2 28 09/12/2021   Lab Results  Component Value Date   ALT 27 09/12/2021   AST 29 09/12/2021   ALKPHOS 96  09/12/2021   BILITOT 0.4 09/12/2021   Lab Results  Component Value Date   HGBA1C 6.8 11/27/2021   HGBA1C 7.9 (H) 07/10/2021   HGBA1C 6.7 (H) 02/07/2021   HGBA1C 6.7 (H) 06/06/2020   HGBA1C 6.2 (H) 02/02/2020   Lab Results  Component Value Date   INSULIN 12.6 09/12/2021   Lab Results  Component Value Date   TSH 2.620 09/12/2021   Lab Results  Component Value Date   CHOL 156 09/12/2021   HDL 52 09/12/2021   LDLCALC 82 09/12/2021   TRIG 127 09/12/2021   CHOLHDL 3.1 02/07/2021   Lab Results  Component Value Date   VD25OH 13.9 (L) 09/12/2021   VD25OH 23 (L) 07/25/2010   VD25OH 17 (L) 11/14/2009   Lab Results  Component Value Date   WBC 5.9 09/12/2021   HGB 11.6 09/12/2021   HCT 36.5 09/12/2021   MCV 79 09/12/2021   PLT 309 09/12/2021   Lab Results  Component Value Date   IRON 55 09/12/2021   TIBC 346 09/12/2021   FERRITIN 30 09/12/2021   Attestation Statements:   Reviewed by clinician on day of visit: allergies, medications, problem list, medical history, surgical history, family history, social history, and previous encounter notes.  I, Lizbeth Bark, RMA, am acting as Location manager for Everardo Pacific, FNP.  I have reviewed the above documentation for accuracy and completeness, and I agree with the above. -  ***

## 2022-01-03 ENCOUNTER — Ambulatory Visit (INDEPENDENT_AMBULATORY_CARE_PROVIDER_SITE_OTHER): Payer: Medicare Other | Admitting: Podiatry

## 2022-01-03 ENCOUNTER — Encounter: Payer: Self-pay | Admitting: Podiatry

## 2022-01-03 DIAGNOSIS — M79675 Pain in left toe(s): Secondary | ICD-10-CM | POA: Diagnosis not present

## 2022-01-03 DIAGNOSIS — M79674 Pain in right toe(s): Secondary | ICD-10-CM

## 2022-01-03 DIAGNOSIS — B351 Tinea unguium: Secondary | ICD-10-CM | POA: Diagnosis not present

## 2022-01-03 DIAGNOSIS — E1143 Type 2 diabetes mellitus with diabetic autonomic (poly)neuropathy: Secondary | ICD-10-CM | POA: Diagnosis not present

## 2022-01-07 ENCOUNTER — Other Ambulatory Visit: Payer: Self-pay | Admitting: Family Medicine

## 2022-01-07 DIAGNOSIS — M5136 Other intervertebral disc degeneration, lumbar region: Secondary | ICD-10-CM

## 2022-01-07 DIAGNOSIS — M170 Bilateral primary osteoarthritis of knee: Secondary | ICD-10-CM

## 2022-01-07 NOTE — Progress Notes (Signed)
  Subjective:  Patient ID: Autumn Carson, female    DOB: 02/10/1956,  MRN: [id number]  Autumn Carson presents to clinic today for at risk foot care with history of diabetic neuropathy and painful elongated mycotic toenails 1-5 bilaterally which are tender when wearing enclosed shoe gear. Pain is relieved with periodic professional debridement.  Patient states blood glucose was 102 mg/dl today.  Last known HgA1c was 6.8%.  New problem(s): None.   PCP is Gildardo Pounds, NP , and last visit was Nov 27, 2021.  Allergies  Allergen Reactions   Lisinopril Anaphylaxis    angioedema   Other     Other reaction(s): throat swelling    Review of Systems: Negative except as noted in the HPI.  Objective: No changes noted in today's physical examination. General: Autumn Carson is a pleasant 66 y.o. African American female, morbidly obese in NAD. AAO x 3.   Vascular:  CFT <3 seconds b/l LE. Palpable DP/PT pulses b/l LE. Digital hair present b/l. Skin temperature gradient WNL b/l. No pain with calf compression b/l. No edema noted b/l. No cyanosis or clubbing noted b/l LE.   Dermatological:  Pedal integument with normal turgor, texture and tone BLE. No open wounds b/l LE. No interdigital macerations noted b/l LE. Toenails -5 bilaterally elongated, discolored, dystrophic, thickened, and crumbly with subungual debris and tenderness to dorsal palpation.  Procedure site of bilateral great toes noted to be completely healed with no erythema, no edema, no drainage, no purulence.  Old nailplates growing out distal 1/3 of bilateral great toes with new nailplates growing out proximal 2/3. Both nail plates remain adhered with no erythema, no edema, no drainage, no fluctuance.  Musculoskeletal:  Normal muscle strength 5/5 to all lower extremity muscle groups bilaterally. No pain, crepitus or joint limitation noted with ROM b/l LE. No gross bony pedal deformities b/l. Patient  ambulates independently without assistive aids.   Neurological:  Pt has subjective symptoms of neuropathy. Protective sensation intact 5/5 intact bilaterally with 10g monofilament b/l. Vibratory sensation intact b/l.      Latest Ref Rng & Units 11/27/2021    3:19 PM 07/10/2021   11:05 AM 02/07/2021    9:08 AM  Hemoglobin A1C  Hemoglobin-A1c 0.0 - 7.0 % 6.8  7.9  6.7    Assessment/Plan: 1. Pain due to onychomycosis of toenails of both feet   2. Type 2 diabetes mellitus with diabetic autonomic neuropathy, without long-term current use of insulin ([place])      -Patient was evaluated and treated. All patient's and/or POA's questions/concerns answered on today's visit. -Continue foot and shoe inspections daily. Monitor blood glucose per PCP/Endocrinologist's recommendations. -Patient to continue soft, supportive shoe gear daily. -Mycotic toenails 1-5 bilaterally were debrided in length and girth with sterile nail nippers and dremel without incident. -Patient/POA to call should there be question/concern in the interim.   Return in about 3 months (around 04/05/2022).  Marzetta Board, DPM

## 2022-01-13 ENCOUNTER — Other Ambulatory Visit: Payer: Self-pay | Admitting: Nurse Practitioner

## 2022-01-13 DIAGNOSIS — E890 Postprocedural hypothyroidism: Secondary | ICD-10-CM

## 2022-01-14 ENCOUNTER — Other Ambulatory Visit: Payer: Self-pay | Admitting: Nurse Practitioner

## 2022-01-14 DIAGNOSIS — E118 Type 2 diabetes mellitus with unspecified complications: Secondary | ICD-10-CM

## 2022-01-14 DIAGNOSIS — I10 Essential (primary) hypertension: Secondary | ICD-10-CM

## 2022-01-15 NOTE — Telephone Encounter (Signed)
All refilled 11/27/2021 #90 1 refill - a 6 month supply. Requested Prescriptions  Pending Prescriptions Disp Refills  . metFORMIN (GLUCOPHAGE) 1000 MG tablet [Pharmacy Med Name: metFORMIN HCl 1000 MG Oral Tablet] 200 tablet 2    Sig: TAKE 1 TABLET BY MOUTH TWICE  DAILY WITH MEALS     Endocrinology:  Diabetes - Biguanides Failed - 01/14/2022 10:34 PM      Failed - Cr in normal range and within 360 days    Creat  Date Value Ref Range Status  06/06/2020 0.84 0.50 - 0.99 mg/dL Final    Comment:    For patients >46 years of age, the reference limit for Creatinine is approximately 13% higher for people identified as African-American. .    Creatinine, Ser  Date Value Ref Range Status  09/12/2021 1.10 (H) 0.57 - 1.00 mg/dL Final   Creatinine, Urine  Date Value Ref Range Status  06/06/2020 119 20 - 275 mg/dL Final         Failed - eGFR in normal range and within 360 days    GFR calc Af Amer  Date Value Ref Range Status  02/12/2020 >60 >60 mL/min Final   GFR calc non Af Amer  Date Value Ref Range Status  02/12/2020 >60 >60 mL/min Final   eGFR  Date Value Ref Range Status  09/12/2021 56 (L) >59 mL/min/1.73 Final         Passed - HBA1C is between 0 and 7.9 and within 180 days    Hemoglobin A1C  Date Value Ref Range Status  07/26/2016 6.2  Final    Comment:    [facility] home testing/[employer]   HbA1c, POC (controlled diabetic range)  Date Value Ref Range Status  11/27/2021 6.8 0.0 - 7.0 % Final         Passed - B12 Level in normal range and within 720 days    Vitamin B-12  Date Value Ref Range Status  09/12/2021 343 232 - 1,245 pg/mL Final         Passed - Valid encounter within last 6 months    Recent Outpatient Visits          1 month ago Controlled type 2 diabetes mellitus with complication, without long-term current use of insulin ([place])   [place], Vernia Buff, NP   6 months ago Encounter for Papanicolaou smear for cervical  cancer screening   [place], Vernia Buff, NP   8 months ago Essential hypertension   [place], Vernia Buff, NP   8 months ago Essential hypertension   [place], Vernia Buff, NP   10 months ago Occipital headache   [place], Vernia Buff, NP      Future Appointments            In 4 months Gildardo Pounds, NP [place] - CBC within normal limits and completed in the last 12 months    WBC  Date Value Ref Range Status  09/12/2021 5.9 3.4 - 10.8 x10E3/uL Final  06/06/2020 5.1 3.8 - 10.8 Thousand/uL Final   RBC  Date Value Ref Range Status  09/12/2021 4.63 3.77 - 5.28 x10E6/uL Final  06/06/2020 4.29 3.80 - 5.10 Million/uL Final   Hemoglobin  Date Value Ref Range Status  09/12/2021 11.6 11.1 - 15.9 g/dL Final   Hematocrit  Date Value Ref Range Status  09/12/2021 36.5 34.0 - 46.6 % Final   MCHC  Date Value Ref Range Status  09/12/2021 31.8 31.5 - 35.7 g/dL Final  06/06/2020 31.1 (L) 32.0 - 36.0 g/dL Final   [employer]  Date Value Ref Range Status  09/12/2021 25.1 (L) 26.6 - 33.0 pg Final  06/06/2020 25.4 (L) 27.0 - 33.0 pg Final   MCV  Date Value Ref Range Status  09/12/2021 79 79 - 97 fL Final   No results found for: "PLTCOUNTKUC", "LABPLAT", "POCPLA" RDW  Date Value Ref Range Status  09/12/2021 14.8 11.7 - 15.4 % Final         . nebivolol (BYSTOLIC) 10 MG tablet [Pharmacy Med Name: Nebivolol HCl 10 MG Oral Tablet] 100 tablet 2    Sig: TAKE 1 TABLET BY MOUTH DAILY     Cardiovascular: Beta Blockers 3 Failed - 01/14/2022 10:34 PM      Failed - Cr in normal range and within 360 days    Creat  Date Value Ref Range Status  06/06/2020 0.84 0.50 - 0.99 mg/dL Final    Comment:    For patients >42 years of age, the reference limit for Creatinine is approximately 13% higher for  people identified as African-American. .    Creatinine, Ser  Date Value Ref Range Status  09/12/2021 1.10 (H) 0.57 - 1.00 mg/dL Final   Creatinine, Urine  Date Value Ref Range Status  06/06/2020 119 20 - 275 mg/dL Final         Passed - AST in normal range and within 360 days    AST  Date Value Ref Range Status  09/12/2021 29 0 - 40 IU/L Final         Passed - ALT in normal range and within 360 days    ALT  Date Value Ref Range Status  09/12/2021 27 0 - 32 IU/L Final         Passed - Last BP in normal range    BP Readings from Last 1 Encounters:  01/01/22 138/81         Passed - Last Heart Rate in normal range    Pulse Readings from Last 1 Encounters:  01/01/22 (!) 52         Passed - Valid encounter within last 6 months    Recent Outpatient Visits          1 month ago Controlled type 2 diabetes mellitus with complication, without long-term current use of insulin ([place])   [place], Vernia Buff, NP   6 months ago Encounter for Papanicolaou smear for cervical cancer screening   [place], Vernia Buff, NP   8 months ago Essential hypertension   [place], Vernia Buff, NP   8 months ago Essential hypertension   [place], Vernia Buff, NP   10 months ago Occipital headache   [place], Vernia Buff, NP      Future Appointments            In 4 months Gildardo Pounds, NP [place]           . chlorthalidone (HYGROTON) 25 MG tablet [Pharmacy Med Name: Chlorthalidone 25 MG Oral Tablet] 100 tablet 2    Sig: TAKE  1 TABLET BY MOUTH DAILY     Cardiovascular: Diuretics - Thiazide Failed - 01/14/2022 10:34 PM      Failed - Cr in normal range and within 180 days    Creat  Date Value Ref Range Status  06/06/2020 0.84 0.50 - 0.99 mg/dL Final    Comment:     For patients >66 years of age, the reference limit for Creatinine is approximately 13% higher for people identified as African-American. .    Creatinine, Ser  Date Value Ref Range Status  09/12/2021 1.10 (H) 0.57 - 1.00 mg/dL Final   Creatinine, Urine  Date Value Ref Range Status  06/06/2020 119 20 - 275 mg/dL Final         Passed - K in normal range and within 180 days    Potassium  Date Value Ref Range Status  09/12/2021 4.0 3.5 - 5.2 mmol/L Final         Passed - Na in normal range and within 180 days    Sodium  Date Value Ref Range Status  09/12/2021 140 134 - 144 mmol/L Final         Passed - Last BP in normal range    BP Readings from Last 1 Encounters:  01/01/22 138/81         Passed - Valid encounter within last 6 months    Recent Outpatient Visits          1 month ago Controlled type 2 diabetes mellitus with complication, without long-term current use of insulin ([place])   [place], Vernia Buff, NP   6 months ago Encounter for Papanicolaou smear for cervical cancer screening   [place], Vernia Buff, NP   8 months ago Essential hypertension   [place], Vernia Buff, NP   8 months ago Essential hypertension   [place], Vernia Buff, NP   10 months ago Occipital headache   [place], Vernia Buff, NP      Future Appointments            In 4 months Gildardo Pounds, NP [place]

## 2022-01-23 ENCOUNTER — Encounter (INDEPENDENT_AMBULATORY_CARE_PROVIDER_SITE_OTHER): Payer: Self-pay | Admitting: Nurse Practitioner

## 2022-01-23 ENCOUNTER — Ambulatory Visit (INDEPENDENT_AMBULATORY_CARE_PROVIDER_SITE_OTHER): Payer: Medicare Other | Admitting: Nurse Practitioner

## 2022-01-23 VITALS — BP 127/72 | HR 50 | Temp 98.1°F | Ht 65.0 in | Wt 299.0 lb

## 2022-01-23 DIAGNOSIS — E669 Obesity, unspecified: Secondary | ICD-10-CM | POA: Diagnosis not present

## 2022-01-23 DIAGNOSIS — Z6841 Body Mass Index (BMI) 40.0 and over, adult: Secondary | ICD-10-CM | POA: Diagnosis not present

## 2022-01-23 DIAGNOSIS — I152 Hypertension secondary to endocrine disorders: Secondary | ICD-10-CM

## 2022-01-23 DIAGNOSIS — E1159 Type 2 diabetes mellitus with other circulatory complications: Secondary | ICD-10-CM

## 2022-01-24 NOTE — Progress Notes (Signed)
Chief Complaint:   OBESITY Autumn Carson is here to discuss her progress with her obesity treatment plan along with follow-up of her obesity related diagnoses. Autumn Carson is on the [employer] and states she is following her eating plan approximately 40% of the time. Autumn Carson states she is walking for 60 minutes 1-3 times per week.  Today's visit was #: 7 Starting weight: 314 lbs Starting date: 09/12/2020 Today's weight: 299 lbs Today's date: 01/23/2022 Total lbs lost to date: 15 Total lbs lost since last in-office visit: 0  Interim History: Autumn Carson notes it has been a rough 3 weeks.  She lost 3 family members since her last visit, her nephew, uncle, and a cousin.  She is going to 2 funerals this week.  She is struggling with following the plan.  She is drinking green tea, water, and coffee.  She notes some struggles with stress and emotional eating.  She is hoping to go to [place] in the next couple of weeks to see her grandchildren.  She wants to lose weight to have knee surgery.  Subjective:   1. Hypertension associated with diabetes ([place]) Autumn Carson is taking Apresoline 10 mg, Bystolic 10 mg, and Norvasc 10 mg. She denies side effects of chest pain, shortness of breath, or palpitations.  Assessment/Plan:   1. Hypertension associated with diabetes ([place]) Autumn Carson will continue to follow-up with her PCP, and she will continue her medications as directed. Autumn Carson will continue working on healthy weight loss and exercise to improve blood pressure control. We will watch for signs of hypotension as she continues her lifestyle modifications.  2. Obesity with current BMI of 49.8 Autumn Carson is currently in the action stage of change. As such, her goal is to continue with weight loss efforts. She has agreed to the [employer] and practicing portion control and making smarter food choices, such as increasing vegetables and decreasing simple carbohydrates.   Exercise goals: As is.   Behavioral  modification strategies: no skipping meals, meal planning and cooking strategies, and planning for success.  Autumn Carson has agreed to follow-up with our clinic in 4 weeks. She was informed of the importance of frequent follow-up visits to maximize her success with intensive lifestyle modifications for her multiple health conditions.   Objective:   Blood pressure 127/72, pulse (!) 50, temperature 98.1 F (36.7 C), height '5\' 5"'$  (1.651 m), weight 299 lb (135.6 kg), last menstrual period 09/07/2011, SpO2 98 %. Body mass index is 49.76 kg/m.  General: Cooperative, alert, well developed, in no acute distress. HEENT: Conjunctivae and lids unremarkable. Cardiovascular: Regular rhythm.  Lungs: Normal work of breathing. Neurologic: No focal deficits.   Lab Results  Component Value Date   CREATININE 1.10 (H) 09/12/2021   BUN 12 09/12/2021   NA 140 09/12/2021   K 4.0 09/12/2021   CL 97 09/12/2021   CO2 28 09/12/2021   Lab Results  Component Value Date   ALT 27 09/12/2021   AST 29 09/12/2021   ALKPHOS 96 09/12/2021   BILITOT 0.4 09/12/2021   Lab Results  Component Value Date   HGBA1C 6.8 11/27/2021   HGBA1C 7.9 (H) 07/10/2021   HGBA1C 6.7 (H) 02/07/2021   HGBA1C 6.7 (H) 06/06/2020   HGBA1C 6.2 (H) 02/02/2020   Lab Results  Component Value Date   INSULIN 12.6 09/12/2021   Lab Results  Component Value Date   TSH 2.620 09/12/2021   Lab Results  Component Value Date   CHOL 156 09/12/2021   HDL 52 09/12/2021  LDLCALC 82 09/12/2021   TRIG 127 09/12/2021   CHOLHDL 3.1 02/07/2021   Lab Results  Component Value Date   VD25OH 13.9 (L) 09/12/2021   VD25OH 23 (L) 07/25/2010   VD25OH 17 (L) 11/14/2009   Lab Results  Component Value Date   WBC 5.9 09/12/2021   HGB 11.6 09/12/2021   HCT 36.5 09/12/2021   MCV 79 09/12/2021   PLT 309 09/12/2021   Lab Results  Component Value Date   IRON 55 09/12/2021   TIBC 346 09/12/2021   FERRITIN 30 09/12/2021   Attestation Statements:    Reviewed by clinician on day of visit: allergies, medications, problem list, medical history, surgical history, family history, social history, and previous encounter notes.  Time spent on visit including pre-visit chart review and post-visit care and charting was 30 minutes.   Wilhemena Durie, am acting as Location manager for [employer], FNP-C.  I have reviewed the above documentation for accuracy and completeness, and I agree with the above. Everardo Pacific, FNP

## 2022-01-26 ENCOUNTER — Other Ambulatory Visit: Payer: Self-pay | Admitting: Nurse Practitioner

## 2022-01-26 DIAGNOSIS — E890 Postprocedural hypothyroidism: Secondary | ICD-10-CM

## 2022-01-26 NOTE — Telephone Encounter (Signed)
Call to pharmacy- verified Rx sent out 11/27/21 #90- not needed.  Requested Prescriptions  Pending Prescriptions Disp Refills  . levothyroxine (SYNTHROID) 175 MCG tablet [Pharmacy Med Name: Levothyroxine Sodium 175 MCG Oral Tablet] 90 tablet 3    Sig: TAKE 1 TABLET BY MOUTH DAILY  BEFORE BREAKFAST     Endocrinology:  Hypothyroid Agents Passed - 01/26/2022  9:34 AM      Passed - TSH in normal range and within 360 days    TSH  Date Value Ref Range Status  09/12/2021 2.620 0.450 - 4.500 uIU/mL Final         Passed - Valid encounter within last 12 months    Recent Outpatient Visits          2 months ago Controlled type 2 diabetes mellitus with complication, without long-term current use of insulin ([place])   [place], Vernia Buff, NP   6 months ago Encounter for Papanicolaou smear for cervical cancer screening   [place], Vernia Buff, NP   8 months ago Essential hypertension   [place], Vernia Buff, NP   9 months ago Essential hypertension   [place], Vernia Buff, NP   11 months ago Occipital headache   [place], Vernia Buff, NP      Future Appointments            In 4 months Gildardo Pounds, NP [place]

## 2022-02-02 ENCOUNTER — Other Ambulatory Visit: Payer: Self-pay | Admitting: Nurse Practitioner

## 2022-02-02 DIAGNOSIS — E890 Postprocedural hypothyroidism: Secondary | ICD-10-CM

## 2022-02-02 NOTE — Telephone Encounter (Signed)
Requested medications are due for refill today.  no  Requested medications are on the active medications list.  yes  Last refill. 11/27/2021 #90 0 refills  Future visit scheduled.   yes  Notes to clinic.  Pharmacy requesting a 1 year rx.    Requested Prescriptions  Pending Prescriptions Disp Refills   levothyroxine (SYNTHROID) 175 MCG tablet [Pharmacy Med Name: Levothyroxine Sodium 175 MCG Oral Tablet] 90 tablet 3    Sig: TAKE 1 TABLET BY MOUTH DAILY  BEFORE BREAKFAST     Endocrinology:  Hypothyroid Agents Passed - 02/02/2022 10:02 AM      Passed - TSH in normal range and within 360 days    TSH  Date Value Ref Range Status  09/12/2021 2.620 0.450 - 4.500 uIU/mL Final         Passed - Valid encounter within last 12 months    Recent Outpatient Visits           2 months ago Controlled type 2 diabetes mellitus with complication, without long-term current use of insulin ([place])   [place], Vernia Buff, NP   6 months ago Encounter for Papanicolaou smear for cervical cancer screening   [place], Vernia Buff, NP   8 months ago Essential hypertension   [place], Vernia Buff, NP   9 months ago Essential hypertension   [place], Vernia Buff, NP   11 months ago Occipital headache   [place], Vernia Buff, NP       Future Appointments             In 3 months Gildardo Pounds, NP [place]

## 2022-02-20 ENCOUNTER — Encounter (INDEPENDENT_AMBULATORY_CARE_PROVIDER_SITE_OTHER): Payer: Self-pay | Admitting: Nurse Practitioner

## 2022-02-20 ENCOUNTER — Ambulatory Visit (INDEPENDENT_AMBULATORY_CARE_PROVIDER_SITE_OTHER): Payer: Medicare Other | Admitting: Nurse Practitioner

## 2022-02-20 VITALS — BP 142/82 | HR 59 | Temp 98.1°F | Ht 65.0 in | Wt 295.0 lb

## 2022-02-20 DIAGNOSIS — E559 Vitamin D deficiency, unspecified: Secondary | ICD-10-CM | POA: Diagnosis not present

## 2022-02-20 DIAGNOSIS — E669 Obesity, unspecified: Secondary | ICD-10-CM

## 2022-02-20 DIAGNOSIS — E1165 Type 2 diabetes mellitus with hyperglycemia: Secondary | ICD-10-CM | POA: Diagnosis not present

## 2022-02-20 DIAGNOSIS — Z7984 Long term (current) use of oral hypoglycemic drugs: Secondary | ICD-10-CM | POA: Diagnosis not present

## 2022-02-20 DIAGNOSIS — Z6841 Body Mass Index (BMI) 40.0 and over, adult: Secondary | ICD-10-CM | POA: Diagnosis not present

## 2022-02-20 MED ORDER — VITAMIN D (ERGOCALCIFEROL) 1.25 MG (50000 UNIT) PO CAPS: 50000.0000 [IU] | ORAL_CAPSULE | ORAL | 0 refills | Status: DC

## 2022-02-21 ENCOUNTER — Encounter: Payer: Self-pay | Admitting: Nurse Practitioner

## 2022-02-21 ENCOUNTER — Other Ambulatory Visit: Payer: Self-pay | Admitting: Nurse Practitioner

## 2022-02-21 DIAGNOSIS — I10 Essential (primary) hypertension: Secondary | ICD-10-CM

## 2022-02-21 MED ORDER — AMLODIPINE BESYLATE 10 MG PO TABS: 10.0000 mg | ORAL_TABLET | Freq: Every day | ORAL | 3 refills | Status: DC

## 2022-02-22 NOTE — Progress Notes (Signed)
Chief Complaint:   OBESITY Autumn Carson is here to discuss her progress with her obesity treatment plan along with follow-up of her obesity related diagnoses. Cella is on the [employer] and practicing portion control and making smarter food choices, such as increasing vegetables and decreasing simple carbohydrates and states she is following her eating plan approximately 70% of the time. Autumn Carson states she is walking 40 minutes 3 times per week.  Today's visit was #: 8 Starting weight: 314 lbs Starting date: 09/12/2020 Today's weight: 295 lbs Today's date: 02/20/2022 Total lbs lost to date: 19 lbs Total lbs lost since last in-office visit: 4  Interim History: Autumn Carson has done well with weight loss since last visit. Her aunt passed away since last visit and she is going to her funeral today. She likes the [facility] meal plan. Denies hunger or cravings. She wants to lose weight to proceed with knee surgery. Drinking water, coffee and has decreased her Green tea intake.  Subjective:   1. Vitamin D deficiency Autumn Carson is currently taking prescription Vit D 50,000 IU once a week. Denies any nausea, vomiting or muscle weakness. Last DEXA 08/15/21.  2. Type 2 diabetes mellitus with hyperglycemia, without long-term current use of insulin ([place]) Autumn Carson is currently taking Metformin 1000 mg twice a day. Denies any side effects. Her last eye exam: 2/23. She is on statin. She has an appointment with podiatry on 04/13/22. She took Lisinipril in past and stopped due to side effects.  Assessment/Plan:   1. Vitamin D deficiency We will refill Vit D 50,000 IU once a week for 1 month with 0 refills.  Low Vitamin D level contributes to fatigue and are associated with obesity, breast, and colon cancer. She agrees to continue to take prescription Vitamin D '@50'$ ,000 IU every week and will follow-up for routine testing of Vitamin D, at least 2-3 times per year to avoid over-replacement.   -Refill Vitamin D,  Ergocalciferol, (DRISDOL) 1.25 MG (50000 UNIT) CAPS capsule; Take 1 capsule (50,000 Units total) by mouth every 7 (seven) days.  Dispense: 4 capsule; Refill: 0  2. Type 2 diabetes mellitus with hyperglycemia, without long-term current use of insulin ([facility]) Autumn Carson will continue to follow up PCP and continue medications as directed.  Good blood sugar control is important to decrease the likelihood of diabetic complications such as nephropathy, neuropathy, limb loss, blindness, coronary artery disease, and death. Intensive lifestyle modification including diet, exercise and weight loss are the first line of treatment for diabetes.    3. Obesity with current BMI of 49.2 Autumn Carson is currently in the action stage of change. As such, her goal is to continue with weight loss efforts. She has agreed to practicing portion control and making smarter food choices, such as increasing vegetables and decreasing simple carbohydrates.   Exercise goals: As is.  Behavioral modification strategies: increasing lean protein intake, increasing water intake, and planning for success.  Autumn Carson has agreed to follow-up with our clinic in 4 weeks. She was informed of the importance of frequent follow-up visits to maximize her success with intensive lifestyle modifications for her multiple health conditions.   Objective:   Blood pressure (!) 142/82, pulse (!) 59, temperature 98.1 F (36.7 C), height '5\' 5"'$  (1.651 m), weight 295 lb (133.8 kg), last menstrual period 09/07/2011, SpO2 97 %. Body mass index is 49.09 kg/m.  General: Cooperative, alert, well developed, in no acute distress. HEENT: Conjunctivae and lids unremarkable. Cardiovascular: Regular rhythm.  Lungs: Normal work of breathing. Neurologic:  No focal deficits.   Lab Results  Component Value Date   CREATININE 1.10 (H) 09/12/2021   BUN 12 09/12/2021   NA 140 09/12/2021   K 4.0 09/12/2021   CL 97 09/12/2021   CO2 28 09/12/2021   Lab Results  Component  Value Date   ALT 27 09/12/2021   AST 29 09/12/2021   ALKPHOS 96 09/12/2021   BILITOT 0.4 09/12/2021   Lab Results  Component Value Date   HGBA1C 6.8 11/27/2021   HGBA1C 7.9 (H) 07/10/2021   HGBA1C 6.7 (H) 02/07/2021   HGBA1C 6.7 (H) 06/06/2020   HGBA1C 6.2 (H) 02/02/2020   Lab Results  Component Value Date   INSULIN 12.6 09/12/2021   Lab Results  Component Value Date   TSH 2.620 09/12/2021   Lab Results  Component Value Date   CHOL 156 09/12/2021   HDL 52 09/12/2021   LDLCALC 82 09/12/2021   TRIG 127 09/12/2021   CHOLHDL 3.1 02/07/2021   Lab Results  Component Value Date   VD25OH 13.9 (L) 09/12/2021   VD25OH 23 (L) 07/25/2010   VD25OH 17 (L) 11/14/2009   Lab Results  Component Value Date   WBC 5.9 09/12/2021   HGB 11.6 09/12/2021   HCT 36.5 09/12/2021   MCV 79 09/12/2021   PLT 309 09/12/2021   Lab Results  Component Value Date   IRON 55 09/12/2021   TIBC 346 09/12/2021   FERRITIN 30 09/12/2021   Attestation Statements:   Reviewed by clinician on day of visit: allergies, medications, problem list, medical history, surgical history, family history, social history, and previous encounter notes.  I, Brendell Tyus, RMA, am acting as transcriptionist for Everardo Pacific, FNP.  I have reviewed the above documentation for accuracy and completeness, and I agree with the above. Everardo Pacific, FNP

## 2022-03-07 ENCOUNTER — Encounter (INDEPENDENT_AMBULATORY_CARE_PROVIDER_SITE_OTHER): Payer: Self-pay

## 2022-03-17 ENCOUNTER — Other Ambulatory Visit (INDEPENDENT_AMBULATORY_CARE_PROVIDER_SITE_OTHER): Payer: Self-pay | Admitting: Nurse Practitioner

## 2022-03-17 DIAGNOSIS — E559 Vitamin D deficiency, unspecified: Secondary | ICD-10-CM

## 2022-03-20 ENCOUNTER — Ambulatory Visit (INDEPENDENT_AMBULATORY_CARE_PROVIDER_SITE_OTHER): Payer: Medicare Other | Admitting: Nurse Practitioner

## 2022-04-02 ENCOUNTER — Other Ambulatory Visit: Payer: Self-pay | Admitting: Family Medicine

## 2022-04-02 DIAGNOSIS — M5136 Other intervertebral disc degeneration, lumbar region: Secondary | ICD-10-CM

## 2022-04-02 DIAGNOSIS — M170 Bilateral primary osteoarthritis of knee: Secondary | ICD-10-CM

## 2022-04-13 ENCOUNTER — Encounter: Payer: Self-pay | Admitting: Podiatry

## 2022-04-13 ENCOUNTER — Ambulatory Visit (INDEPENDENT_AMBULATORY_CARE_PROVIDER_SITE_OTHER): Payer: Medicare Other | Admitting: Podiatry

## 2022-04-13 DIAGNOSIS — E1143 Type 2 diabetes mellitus with diabetic autonomic (poly)neuropathy: Secondary | ICD-10-CM

## 2022-04-13 DIAGNOSIS — M79675 Pain in left toe(s): Secondary | ICD-10-CM

## 2022-04-13 DIAGNOSIS — L84 Corns and callosities: Secondary | ICD-10-CM | POA: Diagnosis not present

## 2022-04-13 DIAGNOSIS — M79674 Pain in right toe(s): Secondary | ICD-10-CM | POA: Diagnosis not present

## 2022-04-13 DIAGNOSIS — B351 Tinea unguium: Secondary | ICD-10-CM | POA: Diagnosis not present

## 2022-04-18 NOTE — Progress Notes (Signed)
  Subjective:  Patient ID: Autumn Carson, female    DOB: [birth year],  MRN: [id number]  Autumn Carson presents to clinic today for at risk foot care with history of diabetic neuropathy and painful thick toenails that are difficult to trim. Pain interferes with ambulation. Aggravating factors include wearing enclosed shoe gear. Pain is relieved with periodic professional debridement.  Patient states blood glucose was 118 mg/dl today. Last known HgA1c was unknown.  Patient states she has pain located at the medial border of her left great toe which is the site of her previous partial nail avulsion. Denies any redness, drainage or swelling.  PCP is Gildardo Pounds, NP , and last visit was Nov 27, 2021.  Allergies  Allergen Reactions   Lisinopril Anaphylaxis    angioedema   Other     Other reaction(s): throat swelling    Review of Systems: Negative except as noted in the HPI.  Objective: No changes noted in today's physical examination. Autumn Carson is a pleasant 66 y.o. female in NAD. AAO x 3.  Vascular Examination: Palpable pedal pulses b/l LE. Digital hair present b/l. No pedal edema b/l. Skin temperature gradient WNL b/l. No varicosities b/l. CFT <3 seconds b/l LE. Pedal hair present..  Dermatological Examination: Pedal skin with normal turgor, texture and tone b/l. No open wounds. No interdigital macerations b/l. Toenails 1-5 b/l thickened, discolored, dystrophic with subungual debris. There is pain on palpation to dorsal aspect of nailplates.   Hyperkeratotic lesion(s) medial border left hallux.  No erythema, no edema, no drainage, no fluctuance..  Neurological Examination: Protective sensation intact with 10 gram monofilament b/l LE. Vibratory sensation intact b/l LE. Pt has subjective symptoms of neuropathy.  Musculoskeletal Examination: Muscle strength 5/5 to all LE muscle groups b/l. No gross bony deformities bilaterally. Patient ambulates  independent of any assistive aids.     Latest Ref Rng & Units 11/27/2021    3:19 PM 07/10/2021   11:05 AM  Hemoglobin A1C  Hemoglobin-A1c 0.0 - 7.0 % 6.8  7.9    Assessment/Plan: 1. Pain due to onychomycosis of toenails of both feet   2. Callus   3. Type 2 diabetes mellitus with diabetic autonomic neuropathy, without long-term current use of insulin ([place])   -Examined patient. -Continue foot and shoe inspections daily. Monitor blood glucose per PCP/Endocrinologist's recommendations. -Toenails 1-5 b/l were debrided in length and girth with sterile nail nippers and dremel without iatrogenic bleeding.  -Callus(es) medial border left hallux pared utilizing sharp debridement with sterile blade without complication or incident. Total number debrided =1. -Patient/POA to call should there be question/concern in the interim.   Return in about 3 months (around 07/13/2022).  Marzetta Board, DPM

## 2022-05-30 ENCOUNTER — Encounter: Payer: Self-pay | Admitting: Nurse Practitioner

## 2022-05-30 ENCOUNTER — Ambulatory Visit: Payer: Medicare Other | Attending: Nurse Practitioner | Admitting: Nurse Practitioner

## 2022-05-30 VITALS — BP 130/78 | HR 66 | Temp 98.0°F | Ht 65.0 in | Wt 301.0 lb

## 2022-05-30 DIAGNOSIS — E559 Vitamin D deficiency, unspecified: Secondary | ICD-10-CM

## 2022-05-30 DIAGNOSIS — E118 Type 2 diabetes mellitus with unspecified complications: Secondary | ICD-10-CM | POA: Diagnosis not present

## 2022-05-30 DIAGNOSIS — R519 Headache, unspecified: Secondary | ICD-10-CM | POA: Diagnosis not present

## 2022-05-30 DIAGNOSIS — M5136 Other intervertebral disc degeneration, lumbar region: Secondary | ICD-10-CM | POA: Diagnosis not present

## 2022-05-30 DIAGNOSIS — I10 Essential (primary) hypertension: Secondary | ICD-10-CM

## 2022-05-30 DIAGNOSIS — E890 Postprocedural hypothyroidism: Secondary | ICD-10-CM

## 2022-05-30 DIAGNOSIS — M170 Bilateral primary osteoarthritis of knee: Secondary | ICD-10-CM | POA: Diagnosis not present

## 2022-05-30 DIAGNOSIS — M5416 Radiculopathy, lumbar region: Secondary | ICD-10-CM | POA: Insufficient documentation

## 2022-05-30 DIAGNOSIS — F5104 Psychophysiologic insomnia: Secondary | ICD-10-CM

## 2022-05-30 LAB — POCT GLYCOSYLATED HEMOGLOBIN (HGB A1C): HbA1c, POC (controlled diabetic range): 6.5 % (ref 0.0–7.0)

## 2022-05-30 MED ORDER — AMITRIPTYLINE HCL 10 MG PO TABS: 10.0000 mg | ORAL_TABLET | Freq: Every day | ORAL | 1 refills | Status: DC

## 2022-05-30 MED ORDER — NEBIVOLOL HCL 10 MG PO TABS: 10.0000 mg | ORAL_TABLET | Freq: Every day | ORAL | 1 refills | Status: DC

## 2022-05-30 MED ORDER — VITAMIN D (ERGOCALCIFEROL) 1.25 MG (50000 UNIT) PO CAPS: 50000.0000 [IU] | ORAL_CAPSULE | ORAL | 0 refills | Status: DC

## 2022-05-30 MED ORDER — CELECOXIB 100 MG PO CAPS: 100.0000 mg | ORAL_CAPSULE | Freq: Two times a day (BID) | ORAL | 2 refills | Status: DC

## 2022-05-30 MED ORDER — CHLORTHALIDONE 25 MG PO TABS: 25.0000 mg | ORAL_TABLET | Freq: Every day | ORAL | 1 refills | Status: DC

## 2022-05-30 MED ORDER — ZOLPIDEM TARTRATE 5 MG PO TABS: ORAL_TABLET | ORAL | 3 refills | Status: DC

## 2022-05-30 MED ORDER — LEVOTHYROXINE SODIUM 175 MCG PO TABS: 175.0000 ug | ORAL_TABLET | Freq: Every day | ORAL | 1 refills | Status: DC

## 2022-05-30 MED ORDER — METFORMIN HCL 1000 MG PO TABS: 1000.0000 mg | ORAL_TABLET | Freq: Two times a day (BID) | ORAL | 1 refills | Status: DC

## 2022-05-30 NOTE — Progress Notes (Signed)
Assessment & Plan:  Autumn Carson was seen today for hypertension, diabetes and hyperlipidemia.  Diagnoses and all orders for this visit:  Essential hypertension -     chlorthalidone (HYGROTON) 25 MG tablet; Take 1 tablet (25 mg total) by mouth daily. -     nebivolol (BYSTOLIC) 10 MG tablet; Take 1 tablet (10 mg total) by mouth daily. -     CMP14+EGFR  Controlled type 2 diabetes mellitus with complication, without long-term current use of insulin (HCC) -     POCT glycosylated hemoglobin (Hb A1C) -     metFORMIN (GLUCOPHAGE) 1000 MG tablet; Take 1 tablet (1,000 mg total) by mouth 2 (two) times daily with a meal.  Occipital headache -     amitriptyline (ELAVIL) 10 MG tablet; Take 1 tablet (10 mg total) by mouth at bedtime.  DDD (degenerative disc disease), lumbar -     celecoxib (CELEBREX) 100 MG capsule; Take 1 capsule (100 mg total) by mouth 2 (two) times daily. FOR MODERATE PAIN  Primary osteoarthritis of both knees -     celecoxib (CELEBREX) 100 MG capsule; Take 1 capsule (100 mg total) by mouth 2 (two) times daily. FOR MODERATE PAIN  Chronic insomnia -     zolpidem (AMBIEN) 5 MG tablet; TAKE 1 TABLET EVERY NIGHT AT BEDTIME AS NEEDED FOR SLEEP  Vitamin D deficiency -     Vitamin D, Ergocalciferol, (DRISDOL) 1.25 MG (50000 UNIT) CAPS capsule; Take 1 capsule (50,000 Units total) by mouth every 7 (seven) days.  Postoperative hypothyroidism -     levothyroxine (SYNTHROID) 175 MCG tablet; Take 1 tablet (175 mcg total) by mouth daily before breakfast. -     Thyroid Panel With TSH    Patient has been counseled on age-appropriate routine health concerns for screening and prevention. These are reviewed and up-to-date. Referrals have been placed accordingly. Immunizations are up-to-date or declined.    Subjective:   Chief Complaint  Patient presents with   Hypertension   Diabetes   Hyperlipidemia   HPI Autumn Carson 66 y.o. female presents to office today for follow up to  HTN and DM.    She has a past medical history of Anxiety, Arthritis of both knees, Back pain, THYROID Cancer with post surgical hypothyroidism (followed by Endocrinology Dr. Buddy Duty), Chronic anemia, Depression (1998), Essential hypertension, GERD, History of thyroid cancer (2011), Hypothyroidism, Lumbar spinal stenosis, MORBID Obesity, PONV, Seasonal allergies, and Type 2 diabetes mellitus with diabetic neuropathy.     DM 2 Well controlled. Down to 6.5. She is taking metformin 1000 mg bid. Complications of diabetes include peripheral neuropathy. LDL not quite at goal with atorvastatin 40 mg daily. Lab Results  Component Value Date   HGBA1C 6.5 05/30/2022   Lab Results  Component Value Date   LDLCALC 82 09/12/2021       HTN  Blood pressure is well controlled today.  She endorses adherence taking amlodipine 10 mg daily, chlorthalidone 25 mg daily, hydralazine 10 mg 3 times a day and Bystolic 10 mg daily. BP Readings from Last 3 Encounters:  05/30/22 130/78  02/20/22 (!) 142/82  01/23/22 127/72    Review of Systems  Constitutional:  Negative for fever, malaise/fatigue and weight loss.  HENT: Negative.  Negative for nosebleeds.   Eyes: Negative.  Negative for blurred vision, double vision and photophobia.  Respiratory: Negative.  Negative for cough and shortness of breath.   Cardiovascular: Negative.  Negative for chest pain, palpitations and leg swelling.  Gastrointestinal: Negative.  Negative  for heartburn, nausea and vomiting.  Musculoskeletal:  Positive for joint pain. Negative for myalgias.  Neurological: Negative.  Negative for dizziness, focal weakness, seizures and headaches.  Psychiatric/Behavioral: Negative.  Negative for suicidal ideas.     Past Medical History:  Diagnosis Date   Anemia    Anxiety    Arthritis of both knees    Back pain    Back pain    Bilateral swelling of feet    Cancer (HCC)    Thyroid   Carpal tunnel syndrome    Chest pain    Chronic anemia     Congestive heart failure (CHF) (HCC)    Constipation    Depression 1998   Essential hypertension    Fatty liver    GERD (gastroesophageal reflux disease)    Glaucoma    High cholesterol    History of thyroid cancer 2011   Papillary thyroid cancer s/p radiation   Hypertension    Hypothyroidism    Joint pain    Lumbar spinal stenosis    Obesity    Osteoarthritis    Palpitations    PONV (postoperative nausea and vomiting)    Rheumatoid arthritis (HCC)    Seasonal allergies    Swallowing difficulty    Type 2 diabetes mellitus with diabetic neuropathy ([place])     Past Surgical History:  Procedure Laterality Date   BACK SURGERY     BIOPSY N/A 01/16/2013   Procedure: BIOPSY;  Surgeon: Danie Binder, MD;  [facility];  Service: Endoscopy;  Laterality: N/A;  SMALL BOWEL ULCERS   BRAVO [place] STUDY N/A 11/22/2015   Procedure: BRAVO [place];  Surgeon: Danie Binder, MD;  Location: [facility];  Service: Endoscopy;  Laterality: N/A;   CARPAL TUNNEL RELEASE     4 total, bilaterally   CESAREAN SECTION     CHOLECYSTECTOMY  1990's   COLONOSCOPY WITH ESOPHAGOGASTRODUODENOSCOPY (EGD) N/A 12/08/2012   SLF: 1. Normal mucosa in the terminal ileum 2. mild diverticulosis throughout the entire examined colon. 3. rectal bleeding due to moderate sized internal hemorrhoids.    ELBOW SURGERY Bilateral    ENTEROSCOPY N/A 01/16/2013   Procedure: ENTEROSCOPY;  Surgeon: Danie Binder, MD;  [facility];  Service: Endoscopy;  Laterality: N/A;  2:00   ESOPHAGOGASTRODUODENOSCOPY N/A 11/22/2015   Procedure: ESOPHAGOGASTRODUODENOSCOPY (EGD);  Surgeon: Danie Binder, MD;  [facility];  Service: Endoscopy;  Laterality: N/A;  130 - moved to 2:00 - office to notify pt   FOOT SURGERY Left 1999   bone spur   GIVENS CAPSULE STUDY N/A 12/31/2012   Procedure: GIVENS CAPSULE STUDY;  Surgeon: Danie Binder, MD;  [facility];  Service: Endoscopy;  Laterality: N/A;   7:30   ROTATOR CUFF REPAIR Bilateral    bilaterally   SAVORY DILATION N/A 11/22/2015   Procedure: SAVORY DILATION;  Surgeon: Danie Binder, MD;  [facility];  Service: Endoscopy;  Laterality: N/A;   SPINE SURGERY     Lumbar Fusion- Dr. Trenton Gammon   TONSILECTOMY, ADENOIDECTOMY, BILATERAL [place]   TOTAL THYROIDECTOMY  2011   TUBAL LIGATION      Family History  Problem Relation Age of Onset   Stroke Mother    Hyperlipidemia Mother    Hypertension Mother    Heart disease Mother    Sudden death Mother    Alcoholism Mother    Cancer Father  Heart disease Father    Hyperlipidemia Father    Lung cancer Father        Age 34   Colon cancer Brother        Age 60   Other Brother        Agent orange, age 93, cancer   Heart attack Daughter 68   Hypertension Daughter    Breast cancer Cousin     Social History Reviewed with no changes to be made today.   Outpatient Medications Prior to Visit  Medication Sig Dispense Refill   albuterol (VENTOLIN HFA) 108 (90 Base) MCG/ACT inhaler Inhale 1-2 puffs into the lungs every 6 (six) hours as needed for wheezing or shortness of breath. 6.7 g 0   amLODipine (NORVASC) 10 MG tablet Take 1 tablet (10 mg total) by mouth daily. 90 tablet 3   aspirin 81 MG tablet Take 81 mg by mouth every other day.     atorvastatin (LIPITOR) 40 MG tablet Take 1 tablet (40 mg total) by mouth daily. 90 tablet 3   Blood Glucose Monitoring Suppl (TRUE METRIX AIR GLUCOSE METER) w/Device KIT USE AS DIRECTED 1 kit 0   ciclopirox (PENLAC) 8 % solution Apply one coat to toenail qd for 48 weeks.  Remove weekly with polish remover. 6.6 mL 11   cyclobenzaprine (FLEXERIL) 10 MG tablet 1 tablet as needed     diclofenac Sodium (VOLTAREN) 1 % GEL      FLUoxetine (PROZAC) 40 MG capsule 1 capsule in the morning     hydrALAZINE (APRESOLINE) 10 MG tablet Take 1 tablet (10 mg total) by mouth 3 (three) times daily. 270 tablet 3    omeprazole (PRILOSEC) 40 MG capsule Take 1 capsule (40 mg total) by mouth daily. 180 capsule 3   pregabalin (LYRICA) 50 MG capsule TAKE 1 CAPSULE AT BEDTIME 30 capsule 0   TRUE METRIX BLOOD GLUCOSE TEST test strip CHECK FINGER STICK BLOOD SUGAR TWICE DAILY AS DIRECTED 200 strip 3   amitriptyline (ELAVIL) 10 MG tablet Take 1 tablet (10 mg total) by mouth at bedtime. 90 tablet 1   celecoxib (CELEBREX) 100 MG capsule TAKE 1 CAPSULE(100 MG) BY MOUTH TWICE DAILY AS NEEDED FOR MODERATE PAIN 60 capsule 2   chlorthalidone (HYGROTON) 25 MG tablet Take 1 tablet (25 mg total) by mouth daily. 90 tablet 1   levothyroxine (SYNTHROID) 175 MCG tablet Take 1 tablet (175 mcg total) by mouth daily before breakfast. 90 tablet 0   metFORMIN (GLUCOPHAGE) 1000 MG tablet Take 1 tablet (1,000 mg total) by mouth 2 (two) times daily with a meal. 180 tablet 1   nebivolol (BYSTOLIC) 10 MG tablet Take 1 tablet (10 mg total) by mouth daily. 90 tablet 1   Vitamin D, Ergocalciferol, (DRISDOL) 1.25 MG (50000 UNIT) CAPS capsule Take 1 capsule (50,000 Units total) by mouth every 7 (seven) days. 4 capsule 0   zolpidem (AMBIEN) 5 MG tablet TAKE 1 TABLET EVERY NIGHT AT BEDTIME AS NEEDED FOR SLEEP 30 tablet 3   No facility-administered medications prior to visit.    Allergies  Allergen Reactions   Lisinopril Anaphylaxis    angioedema   Other     Other reaction(s): throat swelling       Objective:    BP 130/78   Pulse 66   Temp 98 F (36.7 C) (Temporal)   Ht _0  (1.651 m)   Wt (!) 301 lb (136.5 kg)   LMP 09/07/2011   SpO2 99%   BMI 50.09  kg/m  Wt Readings from Last 3 Encounters:  05/30/22 (!) 301 lb (136.5 kg)  02/20/22 295 lb (133.8 kg)  01/23/22 299 lb (135.6 kg)    Physical Exam Vitals and nursing note reviewed.  Constitutional:      Appearance: She is well-developed. She is obese.  HENT:     Head: Normocephalic and atraumatic.  Cardiovascular:     Rate and Rhythm: Normal rate and regular rhythm.      Heart sounds: Normal heart sounds. No murmur heard.    No friction rub. No gallop.  Pulmonary:     Effort: Pulmonary effort is normal. No tachypnea or respiratory distress.     Breath sounds: Normal breath sounds. No decreased breath sounds, wheezing, rhonchi or rales.  Chest:     Chest wall: No tenderness.  Abdominal:     General: Bowel sounds are normal.     Palpations: Abdomen is soft.  Musculoskeletal:        General: Normal range of motion.     Cervical back: Normal range of motion.  Skin:    General: Skin is warm and dry.  Neurological:     Mental Status: She is alert and oriented to person, place, and time.     Coordination: Coordination normal.  Psychiatric:        Behavior: Behavior normal. Behavior is cooperative.        Thought Content: Thought content normal.        Judgment: Judgment normal.          Patient has been counseled extensively about nutrition and exercise as well as the importance of adherence with medications and regular follow-up. The patient was given clear instructions to go to ER or return to medical center if symptoms don't improve, worsen or new problems develop. The patient verbalized understanding.   Follow-up: Return in about 3 months (around 08/30/2022).   Gildardo Pounds, FNP-BC [facility] and [facility] [place], [place]   05/30/2022, 9:43 AM

## 2022-05-31 LAB — THYROID PANEL WITH TSH
Free Thyroxine Index: 2.9 (ref 1.2–4.9)
T3 Uptake Ratio: 31 % (ref 24–39)
T4, Total: 9.3 ug/dL (ref 4.5–12.0)
TSH: 0.523 u[IU]/mL (ref 0.450–4.500)

## 2022-05-31 LAB — CMP14+EGFR
ALT: 17 IU/L (ref 0–32)
AST: 14 IU/L (ref 0–40)
Albumin/Globulin Ratio: 1.7 (ref 1.2–2.2)
Albumin: 4.3 g/dL (ref 3.9–4.9)
Alkaline Phosphatase: 82 IU/L (ref 44–121)
BUN/Creatinine Ratio: 11 — ABNORMAL LOW (ref 12–28)
BUN: 12 mg/dL (ref 8–27)
Bilirubin Total: 0.3 mg/dL (ref 0.0–1.2)
CO2: 26 mmol/L (ref 20–29)
Calcium: 9.1 mg/dL (ref 8.7–10.3)
Chloride: 98 mmol/L (ref 96–106)
Creatinine, Ser: 1.05 mg/dL — ABNORMAL HIGH (ref 0.57–1.00)
Globulin, Total: 2.6 g/dL (ref 1.5–4.5)
Glucose: 105 mg/dL — ABNORMAL HIGH (ref 70–99)
Potassium: 4.1 mmol/L (ref 3.5–5.2)
Sodium: 139 mmol/L (ref 134–144)
Total Protein: 6.9 g/dL (ref 6.0–8.5)
eGFR: 59 mL/min/{1.73_m2} — ABNORMAL LOW (ref >59)

## 2022-06-01 ENCOUNTER — Ambulatory Visit: Payer: Medicare Other | Attending: Nurse Practitioner

## 2022-06-01 DIAGNOSIS — Z0000 Encounter for general adult medical examination without abnormal findings: Secondary | ICD-10-CM

## 2022-06-01 NOTE — Patient Instructions (Addendum)
Autumn Carson , Thank you for taking time to come for your Medicare Wellness Visit. I appreciate your ongoing commitment to your health goals. Please review the following plan we discussed and let me know if I can assist you in the future.   These are the goals we discussed:  Goals      Maintain Mobility and Function     Evidence-based guidance:  Emphasize the importance of physical activity and aerobic exercise as included in treatment plan; assess barriers to adherence; consider patient's abilities and preferences.  Encourage gradual increase in activity or exercise instead of stopping if pain occurs.  Reinforce individual therapy exercise prescription, such as strengthening, stabilization and stretching programs.  Promote optimal body mechanics to stabilize the spine with lifting and functional activity.  Encourage activity and mobility modifications to facilitate optimal function, such as using a log roll for bed mobility or dressing from a seated position.  Reinforce individual adaptive equipment recommendations to limit excessive spinal movements, such as a Systems analyst.  Assess adequacy of sleep; encourage use of sleep hygiene techniques, such as bedtime routine; use of white noise; dark, cool bedroom; avoiding daytime naps, heavy meals or exercise before bedtime.  Promote positions and modification to optimize sleep and sexual activity; consider pillows or positioning devices to assist in maintaining neutral spine.  Explore options for applying ergonomic principles at work and home, such as frequent position changes, using ergonomically designed equipment and working at optimal height.  Promote modifications to increase comfort with driving such as lumbar support, optimizing seat and steering wheel position, using cruise control and taking frequent rest stops to stretch and walk.   Notes:      Pharmacy Care Plan:     CARE PLAN ENTRY  Current Barriers:  Chronic Disease  Management support, education, and care coordination needs related to Hypertension, Hyperlipidemia, Diabetes, and Hypothyroidism   Hypertension Pharmacist Clinical Goal(s): Over the next 180 days, patient will work with PharmD and providers to maintain BP goal <130/80 Current regimen:  Bystolic '10mg'$  Hydralazine '10mg'$  tid Amlodipine '10mg'$  Interventions: Comprehensive medication review Continue current therapy and monitoring plan Patient self care activities - Over the next 180 days, patient will: Check BP when feeling symptomatic, document, and provide at future appointments Ensure daily salt intake < 2300 mg/day Contact providers with consistent blood pressure readings > 130/80.  Hyperlipidemia Pharmacist Clinical Goal(s): Over the next 120 days, patient will work with PharmD and providers to achieve LDL goal < 100 Current regimen:  Atorvastatin '40mg'$  Interventions: Comprehensive medication review Evaluated for myalgias Reviewed most recent lipid panel with patient Patient self care activities - Over the next 180 days, patient will: Continue to take medications as prescribed Contact PharmD or PCP with any medication related concerns  Diabetes Pharmacist Clinical Goal(s): Over the next 180 days, patient will work with PharmD and providers to maintain A1c goal <7% Current regimen:  Metformin '1000mg'$  twice daily Interventions: Discussed diabetic diet and current exercise plan Comprehensive medication review Discussed limiting carbs and sugary beverages Patient self care activities - Over the next 120 days, patient will: Check blood sugar once or twice daily, document, and provide at future appointments Contact provider with any episodes of hypoglycemia or and consistent fasting blood glucose > 130 mg/dl  Hypothyroidism Pharmacist Clinical Goal(s) Over the next 120 days, patient will work with PharmD and providers to optimize medication and minimize symptoms related to  hypothyroidism. Current regimen:  Levothyroxine 175 mcg Interventions: Comprehensive medication review Counseled on appropriate administration  time  Will consult with PCP management of thyroid medication and next steps Patient self care activities - Over the next 120 days, patient will: Continue to take medication as directed Try to get in to see endocrinologist earlier than April    Please see past updates related to this goal by clicking on the "Past Updates" button in the selected goal          This is a list of the screening recommended for you and due dates:  Health Maintenance  Topic Date Due   Yearly kidney health urinalysis for diabetes  06/06/2021   Medicare Annual Wellness Visit  04/08/2022   COVID-19 Vaccine (4 - Moderna risk series) 06/27/2023*   Complete foot exam   11/28/2022   Hemoglobin A1C  11/28/2022   Eye exam for diabetics  12/09/2022   Colon Cancer Screening  12/09/2022   Mammogram  05/02/2023   Yearly kidney function blood test for diabetes  05/31/2023   Pap Smear  07/10/2024   Tetanus Vaccine  05/02/2031   Flu Shot  Completed   Hepatitis C Screening: USPSTF Recommendation to screen - Ages 18-79 yo.  Completed   HIV Screening  Completed   Zoster (Shingles) Vaccine  Completed   HPV Vaccine  Aged Out  *Topic was postponed. The date shown is not the original due date.  Health Maintenance, Female Adopting a healthy lifestyle and getting preventive care are important in promoting health and wellness. Ask your health care provider about: The right schedule for you to have regular tests and exams. Things you can do on your own to prevent diseases and keep yourself healthy. What should I know about diet, weight, and exercise? Eat a healthy diet  Eat a diet that includes plenty of vegetables, fruits, low-fat dairy products, and lean protein. Do not eat a lot of foods that are high in solid fats, added sugars, or sodium. Maintain a healthy weight Body mass  index (BMI) is used to identify weight problems. It estimates body fat based on height and weight. Your health care provider can help determine your BMI and help you achieve or maintain a healthy weight. Get regular exercise Get regular exercise. This is one of the most important things you can do for your health. Most adults should: Exercise for at least 150 minutes each week. The exercise should increase your heart rate and make you sweat (moderate-intensity exercise). Do strengthening exercises at least twice a week. This is in addition to the moderate-intensity exercise. Spend less time sitting. Even light physical activity can be beneficial. Watch cholesterol and blood lipids Have your blood tested for lipids and cholesterol at 64 years of age, then have this test every 5 years. Have your cholesterol levels checked more often if: Your lipid or cholesterol levels are high. You are older than 64 years of age. You are at high risk for heart disease. What should I know about cancer screening? Depending on your health history and family history, you may need to have cancer screening at various ages. This may include screening for: Breast cancer. Cervical cancer. Colorectal cancer. Skin cancer. Lung cancer. What should I know about heart disease, diabetes, and high blood pressure? Blood pressure and heart disease High blood pressure causes heart disease and increases the risk of stroke. This is more likely to develop in people who have high blood pressure readings or are overweight. Have your blood pressure checked: Every 3-5 years if you are 52-26 years of age. Every year if  you are 85 years old or older. Diabetes Have regular diabetes screenings. This checks your fasting blood sugar level. Have the screening done: Once every three years after age 60 if you are at a normal weight and have a low risk for diabetes. More often and at a younger age if you are overweight or have a high risk  for diabetes. What should I know about preventing infection? Hepatitis B If you have a higher risk for hepatitis B, you should be screened for this virus. Talk with your health care provider to find out if you are at risk for hepatitis B infection. Hepatitis C Testing is recommended for: Everyone born from [birth year] through 1963. Anyone with known risk factors for hepatitis C. Sexually transmitted infections (STIs) Get screened for STIs, including gonorrhea and chlamydia, if: You are sexually active and are younger than 64 years of age. You are older than 64 years of age and your health care provider tells you that you are at risk for this type of infection. Your sexual activity has changed since you were last screened, and you are at increased risk for chlamydia or gonorrhea. Ask your health care provider if you are at risk. Ask your health care provider about whether you are at high risk for HIV. Your health care provider may recommend a prescription medicine to help prevent HIV infection. If you choose to take medicine to prevent HIV, you should first get tested for HIV. You should then be tested every 3 months for as long as you are taking the medicine. Pregnancy If you are about to stop having your period (premenopausal) and you may become pregnant, seek counseling before you get pregnant. Take 400 to 800 micrograms (mcg) of folic acid every day if you become pregnant. Ask for birth control (contraception) if you want to prevent pregnancy. Osteoporosis and menopause Osteoporosis is a disease in which the bones lose minerals and strength with aging. This can result in bone fractures. If you are 54 years old or older, or if you are at risk for osteoporosis and fractures, ask your health care provider if you should: Be screened for bone loss. Take a calcium or vitamin D supplement to lower your risk of fractures. Be given hormone replacement therapy (HRT) to treat symptoms of menopause. Follow  these instructions at home: Alcohol use Do not drink alcohol if: Your health care provider tells you not to drink. You are pregnant, may be pregnant, or are planning to become pregnant. If you drink alcohol: Limit how much you have to: 0-1 drink a day. Know how much alcohol is in your drink. In the [place] one drink equals one 12 oz bottle of beer (355 mL), one 5 oz glass of wine (148 mL), or one 1 oz glass of hard liquor (44 mL). Lifestyle Do not use any products that contain nicotine or tobacco. These products include cigarettes, chewing tobacco, and vaping devices, such as e-cigarettes. If you need help quitting, ask your health care provider. Do not use street drugs. Do not share needles. Ask your health care provider for help if you need support or information about quitting drugs. General instructions Schedule regular health, dental, and eye exams. Stay current with your vaccines. Tell your health care provider if: You often feel depressed. You have ever been abused or do not feel safe at home. Summary Adopting a healthy lifestyle and getting preventive care are important in promoting health and wellness. Follow your health care provider's instructions about healthy diet,  exercising, and getting tested or screened for diseases. Follow your health care provider's instructions on monitoring your cholesterol and blood pressure. This information is not intended to replace advice given to you by your health care provider. Make sure you discuss any questions you have with your health care provider. Document Revised: 12/05/2020 Document Reviewed: 12/05/2020 [employer] Patient Education  [place].

## 2022-06-01 NOTE — Progress Notes (Signed)
Subjective:   Autumn Carson is a 66 y.o. female who presents for Medicare Annual (Subsequent) preventive examination.  Review of Systems     I connected with Shaquel Josephson on 06/01/2022 at 10:24 am by telephone and verified that I am speaking with the correct person using two identifiers. I discussed the limitations, risks, security and privacy concerns of performing an evaluation and management service by telephone and the availability of in person appointments. I also discussed with the patient that there may be a patient responsible charge related to this service. The patient expressed understanding and agreed to proceed.   Patient location: Home My Location: [place] on the telephone call: Myself and Patinet     Cardiac Risk Factors include: none     Objective:    Today's Vitals   06/01/22 1025  PainSc: 5    There is no height or weight on file to calculate BMI.     06/01/2022   10:30 AM 10/22/2021    7:48 AM 02/10/2020   10:26 AM 09/29/2019   10:31 AM 12/09/2018    2:46 PM 03/10/2018    8:33 AM 04/23/2016    8:03 AM  Advanced Directives  Does Patient Have a Medical Advance Directive? No No Yes No;Yes Yes No No  Type of Advance Directive   Living will  Living will    Does patient want to make changes to medical advance directive?   No - Patient declined No - Patient declined No - Patient declined    Would patient like information on creating a medical advance directive? Yes (ED - Information included in AVS) No - Patient declined  No - Patient declined  No - Patient declined No - patient declined information;Yes - Educational materials given    Current Medications (verified) Outpatient Encounter Medications as of 06/01/2022  Medication Sig   albuterol (VENTOLIN HFA) 108 (90 Base) MCG/ACT inhaler Inhale 1-2 puffs into the lungs every 6 (six) hours as needed for wheezing or shortness of breath.   amitriptyline (ELAVIL) 10 MG tablet  Take 1 tablet (10 mg total) by mouth at bedtime.   amLODipine (NORVASC) 10 MG tablet Take 1 tablet (10 mg total) by mouth daily.   aspirin 81 MG tablet Take 81 mg by mouth every other day.   atorvastatin (LIPITOR) 40 MG tablet Take 1 tablet (40 mg total) by mouth daily.   Blood Glucose Monitoring Suppl (TRUE METRIX AIR GLUCOSE METER) w/Device KIT USE AS DIRECTED   celecoxib (CELEBREX) 100 MG capsule Take 1 capsule (100 mg total) by mouth 2 (two) times daily. FOR MODERATE PAIN   chlorthalidone (HYGROTON) 25 MG tablet Take 1 tablet (25 mg total) by mouth daily.   ciclopirox (PENLAC) 8 % solution Apply one coat to toenail qd for 48 weeks.  Remove weekly with polish remover.   cyclobenzaprine (FLEXERIL) 10 MG tablet 1 tablet as needed   diclofenac Sodium (VOLTAREN) 1 % GEL    FLUoxetine (PROZAC) 40 MG capsule 1 capsule in the morning   hydrALAZINE (APRESOLINE) 10 MG tablet Take 1 tablet (10 mg total) by mouth 3 (three) times daily.   levothyroxine (SYNTHROID) 175 MCG tablet Take 1 tablet (175 mcg total) by mouth daily before breakfast.   metFORMIN (GLUCOPHAGE) 1000 MG tablet Take 1 tablet (1,000 mg total) by mouth 2 (two) times daily with a meal.   nebivolol (BYSTOLIC) 10 MG tablet Take 1 tablet (10 mg total) by mouth daily.   omeprazole (PRILOSEC)  40 MG capsule Take 1 capsule (40 mg total) by mouth daily.   pregabalin (LYRICA) 50 MG capsule TAKE 1 CAPSULE AT BEDTIME   TRUE METRIX BLOOD GLUCOSE TEST test strip CHECK FINGER STICK BLOOD SUGAR TWICE DAILY AS DIRECTED   Vitamin D, Ergocalciferol, (DRISDOL) 1.25 MG (50000 UNIT) CAPS capsule Take 1 capsule (50,000 Units total) by mouth every 7 (seven) days.   zolpidem (AMBIEN) 5 MG tablet TAKE 1 TABLET EVERY NIGHT AT BEDTIME AS NEEDED FOR SLEEP   No facility-administered encounter medications on file as of 06/01/2022.    Allergies (verified) Lisinopril and Other   History: Past Medical History:  Diagnosis Date   Anemia    Anxiety    Arthritis of  both knees    Back pain    Back pain    Bilateral swelling of feet    Cancer (HCC)    Thyroid   Carpal tunnel syndrome    Chest pain    Chronic anemia    Congestive heart failure (CHF) (HCC)    Constipation    Depression 1998   Essential hypertension    Fatty liver    GERD (gastroesophageal reflux disease)    Glaucoma    High cholesterol    History of thyroid cancer 2011   Papillary thyroid cancer s/p radiation   Hypertension    Hypothyroidism    Joint pain    Lumbar spinal stenosis    Obesity    Osteoarthritis    Palpitations    PONV (postoperative nausea and vomiting)    Rheumatoid arthritis (HCC)    Seasonal allergies    Swallowing difficulty    Type 2 diabetes mellitus with diabetic neuropathy ([place])    Past Surgical History:  Procedure Laterality Date   BACK SURGERY     BIOPSY N/A 01/16/2013   Procedure: BIOPSY;  Surgeon: Danie Binder, MD;  [facility];  Service: Endoscopy;  Laterality: N/A;  SMALL BOWEL ULCERS   BRAVO [place] STUDY N/A 11/22/2015   Procedure: BRAVO [place];  Surgeon: Danie Binder, MD;  [facility];  Service: Endoscopy;  Laterality: N/A;   CARPAL TUNNEL RELEASE     4 total, bilaterally   CESAREAN SECTION     CHOLECYSTECTOMY  1990's   COLONOSCOPY WITH ESOPHAGOGASTRODUODENOSCOPY (EGD) N/A 12/08/2012   SLF: 1. Normal mucosa in the terminal ileum 2. mild diverticulosis throughout the entire examined colon. 3. rectal bleeding due to moderate sized internal hemorrhoids.    ELBOW SURGERY Bilateral    ENTEROSCOPY N/A 01/16/2013   Procedure: ENTEROSCOPY;  Surgeon: Danie Binder, MD;  [facility];  Service: Endoscopy;  Laterality: N/A;  2:00   ESOPHAGOGASTRODUODENOSCOPY N/A 11/22/2015   Procedure: ESOPHAGOGASTRODUODENOSCOPY (EGD);  Surgeon: Danie Binder, MD;  [facility];  Service: Endoscopy;  Laterality: N/A;  130 - moved to 2:00 - office to notify pt   FOOT SURGERY Left 1999   bone spur   GIVENS  CAPSULE STUDY N/A 12/31/2012   Procedure: GIVENS CAPSULE STUDY;  Surgeon: Danie Binder, MD;  [facility];  Service: Endoscopy;  Laterality: N/A;  7:30   ROTATOR CUFF REPAIR Bilateral    bilaterally   SAVORY DILATION N/A 11/22/2015   Procedure: SAVORY DILATION;  Surgeon: Danie Binder, MD;  [facility];  Service: Endoscopy;  Laterality: N/A;   SPINE SURGERY     Lumbar Fusion- Dr. Trenton Gammon   TONSILECTOMY, ADENOIDECTOMY, [place]  TONSILLECTOMY  1987   TOTAL THYROIDECTOMY  2011   TUBAL LIGATION     Family History  Problem Relation Age of Onset   Stroke Mother    Hyperlipidemia Mother    Hypertension Mother    Heart disease Mother    Sudden death Mother    Alcoholism Mother    Cancer Father    Heart disease Father    Hyperlipidemia Father    Lung cancer Father        Age 73   Colon cancer Brother        Age 99   Other Brother        Agent orange, age 28, cancer   Heart attack Daughter 102   Hypertension Daughter    Breast cancer Cousin    Social History   Socioeconomic History   Marital status: Divorced    Spouse name: Not on file   Number of children: 2   Years of education: Not on file   Highest education level: Not on file  Occupational History   Occupation: Disabled    Employer: UNEMPLOYED  Tobacco Use   Smoking status: Never   Smokeless tobacco: Never  Vaping Use   Vaping Use: Never used  Substance and Sexual Activity   Alcohol use: Yes    Alcohol/week: 0.0 standard drinks of alcohol    Comment: Occassional   Drug use: No   Sexual activity: Not Currently    Birth control/protection: Surgical  Other Topics Concern   Not on file  Social History Narrative   Not on file   Social Determinants of Health   Financial Resource Strain: Low Risk  (06/01/2022)   Overall Financial Resource Strain (CARDIA)    Difficulty of Paying Living Expenses: Not hard at all  Food Insecurity: No Food Insecurity (06/01/2022)    Hunger Vital Sign    Worried About Running Out of Food in the Last Year: Never true    Ran Out of Food in the Last Year: Never true  Transportation Needs: No Transportation Needs (06/01/2022)   PRAPARE - Hydrologist (Medical): No    Lack of Transportation (Non-Medical): No  Physical Activity: Insufficiently Active (06/01/2022)   Exercise Vital Sign    Days of Exercise per Week: 3 days    Minutes of Exercise per Session: 20 min  Stress: Stress Concern Present (06/01/2022)   [place]    Feeling of Stress : To some extent  Social Connections: Moderately Isolated (06/01/2022)   Social Connection and Isolation Panel [NHANES]    Frequency of Communication with Friends and Family: More than three times a week    Frequency of Social Gatherings with Friends and Family: More than three times a week    Attends Religious Services: More than 4 times per year    Active Member of [employer] or Organizations: No    Attends Archivist Meetings: Never    Marital Status: Separated    Tobacco Counseling Counseling given: Not Answered   Clinical Intake:  Pre-visit preparation completed: No  Pain : 0-10 Pain Score: 5  Pain Location: Knee Pain Orientation: Left, Right     Nutritional Risks: None Diabetes: Yes CBG done?: No  How often do you need to have someone help you when you read instructions, pamphlets, or other written materials from your doctor or pharmacy?: (P) 2 - Rarely  Diabetic?YES  Interpreter Needed?: No  Activities of Daily Living    06/01/2022   10:30 AM 05/31/2022   10:47 AM  In your present state of health, do you have any difficulty performing the following activities:  Hearing? 0 0  Vision? 0 0  Difficulty concentrating or making decisions? 0 1  Walking or climbing stairs? 1 1  Dressing or bathing? 0 0  Doing errands, shopping? 0 0  Preparing Food and  eating ? N N  Using the Toilet? N N  In the past six months, have you accidently leaked urine? N Y  Do you have problems with loss of bowel control? N N  Managing your Medications? N N  Managing your Finances? N N  Housekeeping or managing your Housekeeping? N N    Patient Care Team: Gildardo Pounds, NP as PCP - General (Nurse Practitioner)  Indicate any recent Medical Services you may have received from other than [facility] providers in the past year (date may be approximate).     Assessment:   This is a routine wellness examination for Delaine.  Hearing/Vision screen No results found.  Dietary issues and exercise activities discussed: Current Exercise Habits: Home exercise routine, Type of exercise: walking, Time (Minutes): 20, Frequency (Times/Week): 3, Weekly Exercise (Minutes/Week): 60, Intensity: Mild, Exercise limited by: None identified   Goals Addressed   None    Depression Screen    06/01/2022   10:28 AM 05/30/2022    9:37 AM 11/27/2021    3:06 PM 09/12/2021    3:54 PM 07/10/2021   10:26 AM 05/01/2021    4:17 PM 04/08/2021    2:17 PM  PHQ 2/9 Scores  PHQ - 2 Score 0 _0 0 2  PHQ- 9 Score  _1 Fall Risk    06/01/2022   10:30 AM 05/31/2022   10:47 AM 05/30/2022    9:04 AM 07/10/2021   10:26 AM 05/01/2021    4:17 PM  Fall Risk   Falls in the past year? 0 1 1 0 1  Number falls in past yr: 0 0 0 0 1  Injury with Fall? 0 0 0 0 0  Risk for fall due to : No Fall Risks   No Fall Risks No Fall Risks  Follow up   Falls evaluation completed      FALL RISK PREVENTION PERTAINING TO THE HOME:  Any stairs in or around the home? Yes  If so, are there any without handrails? No  Home free of loose throw rugs in walkways, pet beds, electrical cords, etc? Yes  Adequate lighting in your home to reduce risk of falls? Yes   ASSISTIVE DEVICES UTILIZED TO PREVENT FALLS:  Life alert? No  Use of a cane, walker or w/c? No  Grab bars in the bathroom? No  Shower  chair or bench in shower? No  Elevated toilet seat or a handicapped toilet? Yes   TIMED UP AND GO:  Was the test performed? No .  Length of time to ambulate 10 feet: 0  sec.   Gait slow and steady with assistive device  Cognitive Function:    06/01/2022   10:33 AM  MMSE - Mini Mental State Exam  Orientation to time 5  Orientation to Place 5  Registration 3  Attention/ Calculation 5  Recall 3  Language- name 2 objects 2  Language- repeat 1  Language- follow 3 step command 3  Language- read & follow direction 1  Write  a sentence 1  Copy design 1  Total score 30        06/01/2022   10:32 AM  6CIT Screen  What Year? 0 points  What month? 0 points  What time? 0 points  Count back from 20 0 points  Months in reverse 0 points  Repeat phrase 0 points  Total Score 0 points    Immunizations Immunization History  Administered Date(s) Administered   Influenza Split 03/30/2014   Influenza,inj,Quad PF,6+ Mos 04/17/2016, 05/30/2017, 04/30/2019, 04/09/2020, 04/26/2021, 05/15/2022   Influenza,trivalent, recombinat, inj, PF 05/08/2015   Influenza-Unspecified 05/10/2018   Moderna Sars-Covid-2 Vaccination 09/29/2019, 10/27/2019, 05/31/2020   PNEUMOCOCCAL CONJUGATE-20 07/10/2021   Pneumococcal Polysaccharide-23 05/13/2013   Tdap 05/01/2021   Zoster Recombinat (Shingrix) 08/08/2020, 10/06/2020    TDAP status: Up to date  Flu Vaccine status: Up to date  Pneumococcal vaccine status: Up to date  Covid-19 vaccine status: Completed vaccines  Qualifies for Shingles Vaccine? Yes   Zostavax completed Yes   Shingrix Completed?: Yes  Screening Tests Health Maintenance  Topic Date Due   Diabetic kidney evaluation - Urine ACR  06/06/2021   Medicare Annual Wellness (AWV)  04/08/2022   COVID-19 Vaccine (4 - Moderna risk series) 06/27/2023 (Originally 07/26/2020)   FOOT EXAM  11/28/2022   HEMOGLOBIN A1C  11/28/2022   OPHTHALMOLOGY EXAM  12/09/2022   COLONOSCOPY (Pts 45-33yr  Insurance coverage will need to be confirmed)  12/09/2022   MAMMOGRAM  05/02/2023   Diabetic kidney evaluation - GFR measurement  05/31/2023   PAP SMEAR-Modifier  07/10/2024   TETANUS/TDAP  05/02/2031   INFLUENZA VACCINE  Completed   Hepatitis C Screening  Completed   HIV Screening  Completed   Zoster Vaccines- Shingrix  Completed   HPV VACCINES  Aged Out    Health Maintenance  Health Maintenance Due  Topic Date Due   Diabetic kidney evaluation - Urine ACR  06/06/2021   Medicare Annual Wellness (AWV)  04/08/2022    Colorectal cancer screening: Type of screening: Colonoscopy. Completed 11/28/2012. Repeat every 10 years  Mammogram status: Completed 05/01/2021. Repeat every year  Bone Density Completed 08/15/2021 see chart for results  Lung Cancer Screening: (Low Dose CT Chest recommended if Age 64-80years, 30 pack-year currently smoking OR have quit w/in 15years.) does not qualify.   Lung Cancer Screening Referral: No  Additional Screening:  Hepatitis C Screening: does qualify; Completed 03/04/2017  Vision Screening: Recommended annual ophthalmology exams for early detection of glaucoma and other disorders of the eye. Is the patient up to date with their annual eye exam?  Yes  Who is the provider or what is the name of the office in which the patient attends annual eye exams? [facility] If pt is not established with a provider, would they like to be referred to a provider to establish care? No .   Dental Screening: Recommended annual dental exams for proper oral hygiene  Community Resource Referral / Chronic Care Management: CRR required this visit?  No   CCM required this visit?  No      Plan:     I have personally reviewed and noted the following in the patient's chart:   Medical and social history Use of alcohol, tobacco or illicit drugs  Current medications and supplements including opioid prescriptions. Patient is not currently taking opioid  prescriptions. Functional ability and status Nutritional status Physical activity Advanced directives List of other physicians Hospitalizations, surgeries, and ER visits in previous 12 months Vitals Screenings to include  cognitive, depression, and falls Referrals and appointments  In addition, I have reviewed and discussed with patient certain preventive protocols, quality metrics, and best practice recommendations. A written personalized care plan for preventive services as well as general preventive health recommendations were provided to patient.     Gomez Cleverly, [place]   06/01/2022   Nurse Notes: I spent 30 minutes on this telephone encounter AVS mailed to patinet

## 2022-07-05 ENCOUNTER — Telehealth: Payer: Self-pay | Admitting: Emergency Medicine

## 2022-07-05 NOTE — Telephone Encounter (Signed)
Copied from [place] [id number]. Topic: General - Other >> Jul 05, 2022  1:46 PM Oley Balm E wrote: Reason for CRM: Pt called to report that she has been given a list of oral surgeons from the office instead of general dentists. She is asking for a list to be sent to her mychart that she could select a dentist from. Please advise

## 2022-07-05 NOTE — Telephone Encounter (Signed)
Hello, is this something we can provide or does patient need to call her insurance ?

## 2022-07-06 NOTE — Telephone Encounter (Signed)
Okay, will her know.

## 2022-07-06 NOTE — Telephone Encounter (Signed)
Pt identified by name and date of birth. Pt aware of reply.

## 2022-07-18 ENCOUNTER — Ambulatory Visit (INDEPENDENT_AMBULATORY_CARE_PROVIDER_SITE_OTHER): Payer: Medicare Other | Admitting: Podiatry

## 2022-07-18 DIAGNOSIS — L84 Corns and callosities: Secondary | ICD-10-CM

## 2022-07-18 DIAGNOSIS — M79674 Pain in right toe(s): Secondary | ICD-10-CM

## 2022-07-18 DIAGNOSIS — E1143 Type 2 diabetes mellitus with diabetic autonomic (poly)neuropathy: Secondary | ICD-10-CM

## 2022-07-18 DIAGNOSIS — M79675 Pain in left toe(s): Secondary | ICD-10-CM

## 2022-07-18 DIAGNOSIS — B351 Tinea unguium: Secondary | ICD-10-CM | POA: Diagnosis not present

## 2022-07-18 NOTE — Progress Notes (Unsigned)
  Subjective:  Patient ID: Autumn Carson, female    DOB: 10-29-1955,  MRN: [id number]  Autumn Carson presents to clinic today for {[id number]}  Chief Complaint  Patient presents with   Nail Problem    Dfc, pt states she is having pain in her 1st toe on left foot BG - 117 A1C - pt does not recall PCP - Dr Raul Del , last OV November 2023     PCP is Autumn Pounds, NP.  Allergies  Allergen Reactions   Lisinopril Anaphylaxis    angioedema   Other     Other reaction(s): throat swelling    Review of Systems: Negative except as noted in the HPI.  Objective: No changes noted in today's physical examination.  There were no vitals filed for this visit. Autumn Carson is a pleasant 66 y.o. female morbidly obese in NAD. AAO x 3.  Vascular Examination: Palpable pedal pulses b/l LE. Digital hair present b/l. No pedal edema b/l. Skin temperature gradient WNL b/l. No varicosities b/l. CFT <3 seconds b/l LE. Pedal hair present..  Dermatological Examination: Pedal skin with normal turgor, texture and tone b/l. No open wounds. No interdigital macerations b/l. Toenails 1-5 b/l thickened, discolored, dystrophic with subungual debris. There is pain on palpation to dorsal aspect of nailplates.   Hyperkeratotic lesion(s) medial border left hallux.  No erythema, no edema, no drainage, no fluctuance..  Neurological Examination: Protective sensation intact with 10 gram monofilament b/l LE. Vibratory sensation intact b/l LE. Pt has subjective symptoms of neuropathy.  Musculoskeletal Examination: Muscle strength 5/5 to all LE muscle groups b/l. No gross bony deformities bilaterally. Patient ambulates independent of any assistive aids.  Assessment/Plan: 1. Pain due to onychomycosis of toenails of both feet   2. Callus   3. Type 2 diabetes mellitus with diabetic autonomic neuropathy, without long-term current use of insulin ([place])     No orders of the defined  types were placed in this encounter.   -Patient was evaluated and treated. All patient's and/or POA's questions/concerns answered on today's visit. -Mycotic toenails 1-5 bilaterally were debrided in length and girth with sterile nail nippers and dremel without incident. -Callus(es) left great toe pared utilizing sterile scalpel blade without complication or incident. Total number debrided =1. -Patient/POA to call should there be question/concern in the interim.   No follow-ups on file.  Marzetta Board, DPM

## 2022-07-19 ENCOUNTER — Encounter: Payer: Self-pay | Admitting: Podiatry

## 2022-08-02 ENCOUNTER — Ambulatory Visit: Payer: Self-pay | Admitting: *Deleted

## 2022-08-02 NOTE — Telephone Encounter (Signed)
  Chief Complaint: FAlls Symptoms: Falls from loss of balance Frequency: NA Pertinent Negatives: Patient denies NA Disposition: '[]'$ ED /'[]'$ Urgent Care (no appt availability in office) / '[]'$ Appointment(In office/virtual)/ '[]'$  [facility]/ '[]'$ Home Care/ '[]'$ Refused Recommended Disposition /'[]'$ [facility]/ '[]'$  Follow-up with PCP Additional Notes: Soni, Rn with [facility] calling. States with pt today, pt reports falling 7 times within last year last time 07/21/22 (No injuries). Soni reports pt stated for loss of balance, "Knees give out" no dizziness.  Not with pt presently, called to report to PCP "May benefit from P.T..  Reason for Disposition  [1] Caller requesting NON-URGENT health information AND [2] PCP's office is the best resource  Answer Assessment - Initial Assessment Questions 1. REASON FOR CALL or QUESTION: "What is your reason for calling today?" or "How can I best help you?" or "What question do you have that I can help answer?"     Soni, RN calling with report  Protocols used: Information Only Call - No Triage-A-AH

## 2022-08-23 ENCOUNTER — Other Ambulatory Visit: Payer: Self-pay | Admitting: Nurse Practitioner

## 2022-08-23 DIAGNOSIS — E890 Postprocedural hypothyroidism: Secondary | ICD-10-CM

## 2022-08-23 DIAGNOSIS — I10 Essential (primary) hypertension: Secondary | ICD-10-CM

## 2022-08-23 DIAGNOSIS — E118 Type 2 diabetes mellitus with unspecified complications: Secondary | ICD-10-CM

## 2022-08-23 MED ORDER — LEVOTHYROXINE SODIUM 175 MCG PO TABS: 175.0000 ug | ORAL_TABLET | Freq: Every day | ORAL | 1 refills | Status: DC

## 2022-08-23 MED ORDER — CHLORTHALIDONE 25 MG PO TABS: 25.0000 mg | ORAL_TABLET | Freq: Every day | ORAL | 0 refills | Status: DC

## 2022-08-23 MED ORDER — NEBIVOLOL HCL 10 MG PO TABS: 10.0000 mg | ORAL_TABLET | Freq: Every day | ORAL | 0 refills | Status: DC

## 2022-08-23 MED ORDER — METFORMIN HCL 1000 MG PO TABS: 1000.0000 mg | ORAL_TABLET | Freq: Two times a day (BID) | ORAL | 0 refills | Status: DC

## 2022-08-23 NOTE — Telephone Encounter (Signed)
Requested Prescriptions  Pending Prescriptions Disp Refills   nebivolol (BYSTOLIC) 10 MG tablet 90 tablet 1    Sig: Take 1 tablet (10 mg total) by mouth daily.     Cardiovascular: Beta Blockers 3 Failed - 08/23/2022  9:56 AM      Failed - Cr in normal range and within 360 days    Creat  Date Value Ref Range Status  06/06/2020 0.84 0.50 - 0.99 mg/dL Final    Comment:    For patients >65 years of age, the reference limit for Creatinine is approximately 13% higher for people identified as African-American. .    Creatinine, Ser  Date Value Ref Range Status  05/30/2022 1.05 (H) 0.57 - 1.00 mg/dL Final   Creatinine, Urine  Date Value Ref Range Status  06/06/2020 119 20 - 275 mg/dL Final         Passed - AST in normal range and within 360 days    AST  Date Value Ref Range Status  05/30/2022 14 0 - 40 IU/L Final         Passed - ALT in normal range and within 360 days    ALT  Date Value Ref Range Status  05/30/2022 17 0 - 32 IU/L Final         Passed - Last BP in normal range    BP Readings from Last 1 Encounters:  05/30/22 130/78         Passed - Last Heart Rate in normal range    Pulse Readings from Last 1 Encounters:  05/30/22 66         Passed - Valid encounter within last 6 months    Recent Outpatient Visits           2 months ago Essential hypertension   [place], [place] W, NP   8 months ago Controlled type 2 diabetes mellitus with complication, without long-term current use of insulin [facility] [place])   [place], Vernia Buff, NP   1 year ago Encounter for Papanicolaou smear for cervical cancer screening   [place], Vernia Buff, NP   1 year ago Essential hypertension   [place], [place] W, NP   1 year ago Essential hypertension   [place], Vernia Buff, NP        Future Appointments             In 1 week Gildardo Pounds, NP [place]             metFORMIN (GLUCOPHAGE) 1000 MG tablet 180 tablet 1    Sig: Take 1 tablet (1,000 mg total) by mouth 2 (two) times daily with a meal.     Endocrinology:  Diabetes - Biguanides Failed - 08/23/2022  9:56 AM      Failed - Cr in normal range and within 360 days    Creat  Date Value Ref Range Status  06/06/2020 0.84 0.50 - 0.99 mg/dL Final    Comment:    For patients >84 years of age, the reference limit for Creatinine is approximately 13% higher for people identified as African-American. .    Creatinine, Ser  Date Value Ref Range Status  05/30/2022 1.05 (H) 0.57 - 1.00 mg/dL Final   Creatinine, Urine  Date Value Ref Range Status  06/06/2020  119 20 - 275 mg/dL Final         Failed - eGFR in normal range and within 360 days    GFR calc Af Amer  Date Value Ref Range Status  02/12/2020 >60 >60 mL/min Final   GFR calc non Af Amer  Date Value Ref Range Status  02/12/2020 >60 >60 mL/min Final   eGFR  Date Value Ref Range Status  05/30/2022 59 (L) >59 mL/min/1.73 Final         Passed - HBA1C is between 0 and 7.9 and within 180 days    Hemoglobin A1C  Date Value Ref Range Status  07/26/2016 6.2  Final    Comment:    Home Access home testing/[employer]   HbA1c, POC (controlled diabetic range)  Date Value Ref Range Status  05/30/2022 6.5 0.0 - 7.0 % Final         Passed - B12 Level in normal range and within 720 days    Vitamin B-12  Date Value Ref Range Status  09/12/2021 343 232 - 1,245 pg/mL Final         Passed - Valid encounter within last 6 months    Recent Outpatient Visits           2 months ago Essential hypertension   [place], Vernia Buff, NP   8 months ago Controlled type 2 diabetes mellitus with complication, without long-term current use of insulin ([place])   [place], [place] W, NP   1 year ago Encounter for Papanicolaou smear for cervical cancer screening   [place], [place] W, NP   1 year ago Essential hypertension   [place], [place] W, NP   1 year ago Essential hypertension   [place], Vernia Buff, NP       Future Appointments             In 1 week Gildardo Pounds, NP [place] within normal limits and completed in the last 12 months    WBC  Date Value Ref Range Status  09/12/2021 5.9 3.4 - 10.8 x10E3/uL Final  06/06/2020 5.1 3.8 - 10.8 Thousand/uL Final   RBC  Date Value Ref Range Status  09/12/2021 4.63 3.77 - 5.28 x10E6/uL Final  06/06/2020 4.29 3.80 - 5.10 Million/uL Final   Hemoglobin  Date Value Ref Range Status  09/12/2021 11.6 11.1 - 15.9 g/dL Final   Hematocrit  Date Value Ref Range Status  09/12/2021 36.5 34.0 - 46.6 % Final   MCHC  Date Value Ref Range Status  09/12/2021 31.8 31.5 - 35.7 g/dL Final  06/06/2020 31.1 (L) 32.0 - 36.0 g/dL Final   [facility]  Date Value Ref Range Status  09/12/2021 25.1 (L) 26.6 - 33.0 pg Final  06/06/2020 25.4 (L) 27.0 - 33.0 pg Final   MCV  Date Value Ref Range Status  09/12/2021 79 79 - 97 fL Final   No results found for: "PLTCOUNTKUC", "LABPLAT", "POCPLA" RDW  Date Value Ref Range Status  09/12/2021 14.8 11.7 - 15.4 % Final          chlorthalidone (HYGROTON) 25 MG tablet 90 tablet 0    Sig: Take 1 tablet (25 mg total) by mouth daily.  Cardiovascular: Diuretics - Thiazide Failed - 08/23/2022  9:56 AM      Failed - Cr in normal range and within 180 days    Creat  Date Value Ref Range Status  06/06/2020 0.84 0.50 - 0.99 mg/dL Final    Comment:    For patients >34 years of age, the reference limit for Creatinine is approximately 13% higher for  people identified as African-American. .    Creatinine, Ser  Date Value Ref Range Status  05/30/2022 1.05 (H) 0.57 - 1.00 mg/dL Final   Creatinine, Urine  Date Value Ref Range Status  06/06/2020 119 20 - 275 mg/dL Final         Passed - K in normal range and within 180 days    Potassium  Date Value Ref Range Status  05/30/2022 4.1 3.5 - 5.2 mmol/L Final         Passed - Na in normal range and within 180 days    Sodium  Date Value Ref Range Status  05/30/2022 139 134 - 144 mmol/L Final         Passed - Last BP in normal range    BP Readings from Last 1 Encounters:  05/30/22 130/78         Passed - Valid encounter within last 6 months    Recent Outpatient Visits           2 months ago Essential hypertension   [place], [place] W, NP   8 months ago Controlled type 2 diabetes mellitus with complication, without long-term current use of insulin [facility])   [place], Vernia Buff, NP   1 year ago Encounter for Papanicolaou smear for cervical cancer screening   [place], Vernia Buff, NP   1 year ago Essential hypertension   [place], Vernia Buff, NP   1 year ago Essential hypertension   [place], [place] W, NP       Future Appointments             In 1 week Gildardo Pounds, NP [place]             levothyroxine (SYNTHROID) 175 MCG tablet 90 tablet 1    Sig: Take 1 tablet (175 mcg total) by mouth daily before breakfast.     Endocrinology:  Hypothyroid Agents Passed - 08/23/2022  9:56 AM      Passed - TSH in normal range and within 360 days    TSH  Date Value Ref Range Status  05/30/2022 0.523 0.450 - 4.500 uIU/mL Final         Passed - Valid encounter within last 12 months    Recent Outpatient Visits            2 months ago Essential hypertension   [place], [place] W, NP   8 months ago Controlled type 2 diabetes mellitus with complication, without long-term current use of insulin [facility] [place])   [place], Vernia Buff, NP   1 year ago Encounter for Papanicolaou smear for cervical cancer screening   [place], Vernia Buff, NP   1 year ago Essential hypertension   [place], [place]  W, NP   1 year ago Essential hypertension   [place], Vernia Buff, NP       Future Appointments             In 1 week Gildardo Pounds, NP [place]

## 2022-08-23 NOTE — Telephone Encounter (Signed)
Medication Refill - Medication: levothyroxine (SYNTHROID) 175 MCG tablet [[id number]]  nebivolol (BYSTOLIC) 10 MG tablet [[id number]]   chlorthalidone (HYGROTON) 25 MG tablet [[id number]]  metFORMIN (GLUCOPHAGE) 1000 MG tablet [[id number]]   Has the patient contacted their pharmacy? Yes.   (Agent: If no, request that the patient contact the pharmacy for the refill. If patient does not wish to contact the pharmacy document the reason why and proceed with request.) (Agent: If yes, when and what did the pharmacy advise?) The pharmacy called this in   Preferred Pharmacy (with phone number or street name): [place], [place]  Has the patient been seen for an appointment in the last year OR does the patient have an upcoming appointment? Yes.    Agent: Please be advised that RX refills may take up to 3 business days. We ask that you follow-up with your pharmacy.

## 2022-08-31 ENCOUNTER — Ambulatory Visit: Payer: 59 | Attending: Nurse Practitioner | Admitting: Nurse Practitioner

## 2022-08-31 ENCOUNTER — Encounter: Payer: Self-pay | Admitting: Nurse Practitioner

## 2022-08-31 VITALS — BP 135/72 | HR 64 | Ht 65.0 in | Wt 305.0 lb

## 2022-08-31 DIAGNOSIS — M5136 Other intervertebral disc degeneration, lumbar region: Secondary | ICD-10-CM

## 2022-08-31 DIAGNOSIS — I10 Essential (primary) hypertension: Secondary | ICD-10-CM | POA: Diagnosis not present

## 2022-08-31 DIAGNOSIS — M170 Bilateral primary osteoarthritis of knee: Secondary | ICD-10-CM

## 2022-08-31 DIAGNOSIS — Z1231 Encounter for screening mammogram for malignant neoplasm of breast: Secondary | ICD-10-CM

## 2022-08-31 DIAGNOSIS — E1143 Type 2 diabetes mellitus with diabetic autonomic (poly)neuropathy: Secondary | ICD-10-CM | POA: Diagnosis not present

## 2022-08-31 MED ORDER — ACETAMINOPHEN-CODEINE 300-30 MG PO TABS: 1.0000 | ORAL_TABLET | Freq: Every day | ORAL | 0 refills | Status: DC | PRN

## 2022-08-31 NOTE — Progress Notes (Signed)
Assessment & Plan:  Autumn Carson was seen today for hypertension.  Diagnoses and all orders for this visit:  Primary hypertension -     Basic metabolic panel Continue all antihypertensives as prescribed.  Reminded to bring in blood pressure log for follow  up appointment.  RECOMMENDATIONS: DASH/Mediterranean Diets are healthier choices for HTN.    Type 2 diabetes mellitus with diabetic autonomic neuropathy, without long-term current use of insulin (HCC) -     Basic metabolic panel -     Microalbumin / creatinine urine ratio Continue blood sugar control as discussed in office today, low carbohydrate diet, and regular physical exercise as tolerated, 150 minutes per week (30 min each day, 5 days per week, or 50 min 3 days per week). Keep blood sugar logs with fasting goal of 90-130 mg/dl, post prandial (after you eat) less than 180.  For Hypoglycemia: BS <60 and Hyperglycemia BS >400; contact the clinic ASAP. Annual eye exams and foot exams are recommended.   Breast cancer screening by mammogram -     MM 3D SCREEN BREAST BILATERAL; Future  DDD (degenerative disc disease), lumbar -     acetaminophen-codeine (TYLENOL #3) 300-30 MG tablet; Take 1 tablet by mouth daily as needed for moderate pain.  Primary osteoarthritis of both knees -     acetaminophen-codeine (TYLENOL #3) 300-30 MG tablet; Take 1 tablet by mouth daily as needed for moderate pain. Work on losing weight to help reduce joint pain. May alternate with heat and ice application for pain relief. May also alternate with CELEBREX as prescribed pain relief. Other alternatives include massage, acupuncture and water aerobics.    Patient has been counseled on age-appropriate routine health concerns for screening and prevention. These are reviewed and up-to-date. Referrals have been placed accordingly. Immunizations are up-to-date or declined.    Subjective:   Chief Complaint  Patient presents with   Hypertension    Autumn Carson 67 y.o. female presents to office today for follow up For follow-up to hypertension.  She has a past medical history of Anxiety, primary osteo arthritis of both knees (followed by Ortho), Back pain, THYROID Cancer with post surgical hypothyroidism (followed by Endocrinology Dr. Buddy Duty), Chronic anemia, Depression (1998), Essential hypertension, GERD, History of thyroid cancer (2011), Hypothyroidism, Lumbar spinal stenosis, MORBID Obesity, PONV, Seasonal allergies, and Type 2 diabetes mellitus with diabetic neuropathy.    HTN  Blood pressure is well controlled today.  She endorses adherence taking amlodipine 10 mg daily, chlorthalidone 25 mg daily, hydralazine 10 mg 3 times a day and Bystolic 10 mg daily. BP Readings from Last 3 Encounters:  08/31/22 135/72  05/30/22 130/78  02/20/22 (!) 142/82    DM 2 Well controlled. She is taking metformin 1000 mg bid. Complications of diabetes include peripheral neuropathy. LDL not quite at goal with atorvastatin 40 mg daily.  Weight seems seems to be trending up gradually Lab Results  Component Value Date   HGBA1C 6.5 05/30/2022     KNEE PAIN She has primary osteoarthritis of both knees.  BMI 50.  Currently being [facility] and has had injections in the past.  Does not feel Celebrex is very effective with relieving pain.   Review of Systems  Constitutional:  Negative for fever, malaise/fatigue and weight loss.  HENT: Negative.  Negative for nosebleeds.   Eyes: Negative.  Negative for blurred vision, double vision and photophobia.  Respiratory: Negative.  Negative for cough and shortness of breath.   Cardiovascular: Negative.  Negative for chest pain, palpitations and leg swelling.  Gastrointestinal: Negative.  Negative for heartburn, nausea and vomiting.  Musculoskeletal:  Positive for joint pain. Negative for myalgias.  Neurological: Negative.  Negative for dizziness, focal weakness, seizures and headaches.   Psychiatric/Behavioral: Negative.  Negative for suicidal ideas.     Past Medical History:  Diagnosis Date   Anemia    Anxiety    Arthritis of both knees    Back pain    Back pain    Bilateral swelling of feet    Cancer (HCC)    Thyroid   Carpal tunnel syndrome    Chest pain    Chronic anemia    Congestive heart failure (CHF) (HCC)    Constipation    Depression 1998   Essential hypertension    Fatty liver    GERD (gastroesophageal reflux disease)    Glaucoma    High cholesterol    History of thyroid cancer 2011   Papillary thyroid cancer s/p radiation   Hypertension    Hypothyroidism    Joint pain    Lumbar spinal stenosis    Obesity    Osteoarthritis    Palpitations    PONV (postoperative nausea and vomiting)    Rheumatoid arthritis (HCC)    Seasonal allergies    Swallowing difficulty    Type 2 diabetes mellitus with diabetic neuropathy ([place])     Past Surgical History:  Procedure Laterality Date   BACK SURGERY     BIOPSY N/A 01/16/2013   Procedure: BIOPSY;  Surgeon: Danie Binder, MD;  [facility];  Service: Endoscopy;  Laterality: N/A;  SMALL BOWEL ULCERS   BRAVO [place] STUDY N/A 11/22/2015   Procedure: BRAVO [place];  Surgeon: Danie Binder, MD;  Location[facility];  Service: Endoscopy;  Laterality: N/A;   CARPAL TUNNEL RELEASE     4 total, bilaterally   CESAREAN SECTION     CHOLECYSTECTOMY  1990's   COLONOSCOPY WITH ESOPHAGOGASTRODUODENOSCOPY (EGD) N/A 12/08/2012   SLF: 1. Normal mucosa in the terminal ileum 2. mild diverticulosis throughout the entire examined colon. 3. rectal bleeding due to moderate sized internal hemorrhoids.    ELBOW SURGERY Bilateral    ENTEROSCOPY N/A 01/16/2013   Procedure: ENTEROSCOPY;  Surgeon: Danie Binder, MD;  [facility];  Service: Endoscopy;  Laterality: N/A;  2:00   ESOPHAGOGASTRODUODENOSCOPY N/A 11/22/2015   Procedure: ESOPHAGOGASTRODUODENOSCOPY (EGD);  Surgeon: Danie Binder, MD;  [facility]  [facility];  Service: Endoscopy;  Laterality: N/A;  130 - moved to 2:00 - office to notify pt   FOOT SURGERY Left 1999   bone spur   GIVENS CAPSULE STUDY N/A 12/31/2012   Procedure: GIVENS CAPSULE STUDY;  Surgeon: Danie Binder, MD;  [facility];  Service: Endoscopy;  Laterality: N/A;  7:30   ROTATOR CUFF REPAIR Bilateral    bilaterally   SAVORY DILATION N/A 11/22/2015   Procedure: SAVORY DILATION;  Surgeon: Danie Binder, MD;  [facility];  Service: Endoscopy;  Laterality: N/A;   SPINE SURGERY     Lumbar Fusion- Dr. Trenton Gammon   TONSILECTOMY, ADENOIDECTOMY, BILATERAL [place]   TOTAL THYROIDECTOMY  2011   TUBAL LIGATION      Family History  Problem Relation Age of Onset   Stroke Mother    Hyperlipidemia Mother    Hypertension Mother    Heart disease Mother    Sudden death  Mother    Alcoholism Mother    Cancer Father    Heart disease Father    Hyperlipidemia Father    Lung cancer Father        Age 65   Colon cancer Brother        Age 1   Other Brother        Agent orange, age 64, cancer   Heart attack Daughter 13   Hypertension Daughter    Breast cancer Cousin     Social History Reviewed with no changes to be made today.   Outpatient Medications Prior to Visit  Medication Sig Dispense Refill   albuterol (VENTOLIN HFA) 108 (90 Base) MCG/ACT inhaler Inhale 1-2 puffs into the lungs every 6 (six) hours as needed for wheezing or shortness of breath. 6.7 g 0   amitriptyline (ELAVIL) 10 MG tablet Take 1 tablet (10 mg total) by mouth at bedtime. 90 tablet 1   amLODipine (NORVASC) 10 MG tablet Take 1 tablet (10 mg total) by mouth daily. 90 tablet 3   aspirin 81 MG tablet Take 81 mg by mouth every other day.     atorvastatin (LIPITOR) 40 MG tablet Take 1 tablet (40 mg total) by mouth daily. 90 tablet 3   Blood Glucose Monitoring Suppl (TRUE METRIX AIR GLUCOSE METER) w/Device KIT USE AS DIRECTED 1 kit 0    celecoxib (CELEBREX) 100 MG capsule Take 1 capsule (100 mg total) by mouth 2 (two) times daily. FOR MODERATE PAIN 60 capsule 2   chlorthalidone (HYGROTON) 25 MG tablet Take 1 tablet (25 mg total) by mouth daily. 90 tablet 0   ciclopirox (PENLAC) 8 % solution Apply one coat to toenail qd for 48 weeks.  Remove weekly with polish remover. 6.6 mL 11   cyclobenzaprine (FLEXERIL) 10 MG tablet 1 tablet as needed     diclofenac Sodium (VOLTAREN) 1 % GEL      FLUoxetine (PROZAC) 40 MG capsule 1 capsule in the morning     hydrALAZINE (APRESOLINE) 10 MG tablet Take 1 tablet (10 mg total) by mouth 3 (three) times daily. 270 tablet 3   levothyroxine (SYNTHROID) 175 MCG tablet Take 1 tablet (175 mcg total) by mouth daily before breakfast. 90 tablet 1   metFORMIN (GLUCOPHAGE) 1000 MG tablet Take 1 tablet (1,000 mg total) by mouth 2 (two) times daily with a meal. 180 tablet 0   nebivolol (BYSTOLIC) 10 MG tablet Take 1 tablet (10 mg total) by mouth daily. 90 tablet 0   omeprazole (PRILOSEC) 40 MG capsule Take 1 capsule (40 mg total) by mouth daily. 180 capsule 3   pregabalin (LYRICA) 50 MG capsule TAKE 1 CAPSULE AT BEDTIME 30 capsule 0   TRUE METRIX BLOOD GLUCOSE TEST test strip CHECK FINGER STICK BLOOD SUGAR TWICE DAILY AS DIRECTED 200 strip 3   Vitamin D, Ergocalciferol, (DRISDOL) 1.25 MG (50000 UNIT) CAPS capsule Take 1 capsule (50,000 Units total) by mouth every 7 (seven) days. 4 capsule 0   zolpidem (AMBIEN) 5 MG tablet TAKE 1 TABLET EVERY NIGHT AT BEDTIME AS NEEDED FOR SLEEP 30 tablet 3   No facility-administered medications prior to visit.    Allergies  Allergen Reactions   Lisinopril Anaphylaxis    angioedema   Other     Other reaction(s): throat swelling       Objective:    BP 135/72   Pulse 64   Ht '5\' 5"'$  (1.651 m)   Wt (!) 305 lb (138.3 kg)   LMP 09/07/2011  SpO2 98%   BMI 50.75 kg/m  Wt Readings from Last 3 Encounters:  08/31/22 (!) 305 lb (138.3 kg)  05/30/22 (!) 301 lb (136.5 kg)   02/20/22 295 lb (133.8 kg)    Physical Exam Vitals and nursing note reviewed.  Constitutional:      Appearance: She is well-developed. She is obese.  HENT:     Head: Normocephalic and atraumatic.  Cardiovascular:     Rate and Rhythm: Normal rate and regular rhythm.     Heart sounds: Normal heart sounds. No murmur heard.    No friction rub. No gallop.  Pulmonary:     Effort: Pulmonary effort is normal. No tachypnea or respiratory distress.     Breath sounds: Normal breath sounds. No decreased breath sounds, wheezing, rhonchi or rales.  Chest:     Chest wall: No tenderness.  Abdominal:     General: Bowel sounds are normal.     Palpations: Abdomen is soft.  Musculoskeletal:        General: Normal range of motion.     Cervical back: Normal range of motion.  Skin:    General: Skin is warm and dry.  Neurological:     Mental Status: She is alert and oriented to person, place, and time.     Coordination: Coordination normal.  Psychiatric:        Behavior: Behavior normal. Behavior is cooperative.        Thought Content: Thought content normal.        Judgment: Judgment normal.          Patient has been counseled extensively about nutrition and exercise as well as the importance of adherence with medications and regular follow-up. The patient was given clear instructions to go to ER or return to medical center if symptoms don't improve, worsen or new problems develop. The patient verbalized understanding.   Follow-up: Return in about 3 months (around 11/29/2022) for HTN.   Gildardo Pounds, FNP-BC [facility] [place], [place]   08/31/2022, 9:44 AM

## 2022-09-01 LAB — BASIC METABOLIC PANEL
BUN/Creatinine Ratio: 10 — ABNORMAL LOW (ref 12–28)
BUN: 13 mg/dL (ref 8–27)
CO2: 25 mmol/L (ref 20–29)
Calcium: 9.2 mg/dL (ref 8.7–10.3)
Chloride: 97 mmol/L (ref 96–106)
Creatinine, Ser: 1.34 mg/dL — ABNORMAL HIGH (ref 0.57–1.00)
Glucose: 93 mg/dL (ref 70–99)
Potassium: 4.4 mmol/L (ref 3.5–5.2)
Sodium: 138 mmol/L (ref 134–144)
eGFR: 44 mL/min/{1.73_m2} — ABNORMAL LOW (ref >59)

## 2022-09-04 LAB — MICROALBUMIN / CREATININE URINE RATIO
Creatinine, Urine: 98.3 mg/dL
Microalb/Creat Ratio: 23 mg/g creat (ref 0–29)
Microalbumin, Urine: 22.2 ug/mL

## 2022-09-05 ENCOUNTER — Other Ambulatory Visit: Payer: Self-pay | Admitting: Family Medicine

## 2022-09-05 DIAGNOSIS — M170 Bilateral primary osteoarthritis of knee: Secondary | ICD-10-CM

## 2022-09-05 DIAGNOSIS — M5136 Other intervertebral disc degeneration, lumbar region: Secondary | ICD-10-CM

## 2022-09-24 ENCOUNTER — Ambulatory Visit: Payer: 59 | Attending: Nurse Practitioner

## 2022-09-24 DIAGNOSIS — E1142 Type 2 diabetes mellitus with diabetic polyneuropathy: Secondary | ICD-10-CM

## 2022-09-25 LAB — CMP14+EGFR
ALT: 13 IU/L (ref 0–32)
AST: 15 IU/L (ref 0–40)
Albumin/Globulin Ratio: 1.9 (ref 1.2–2.2)
Albumin: 4.4 g/dL (ref 3.9–4.9)
Alkaline Phosphatase: 70 IU/L (ref 44–121)
BUN/Creatinine Ratio: 15 (ref 12–28)
BUN: 16 mg/dL (ref 8–27)
Bilirubin Total: 0.3 mg/dL (ref 0.0–1.2)
CO2: 25 mmol/L (ref 20–29)
Calcium: 9.4 mg/dL (ref 8.7–10.3)
Chloride: 100 mmol/L (ref 96–106)
Creatinine, Ser: 1.1 mg/dL — ABNORMAL HIGH (ref 0.57–1.00)
Globulin, Total: 2.3 g/dL (ref 1.5–4.5)
Glucose: 90 mg/dL (ref 70–99)
Potassium: 4.1 mmol/L (ref 3.5–5.2)
Sodium: 140 mmol/L (ref 134–144)
Total Protein: 6.7 g/dL (ref 6.0–8.5)
eGFR: 56 mL/min/{1.73_m2} — ABNORMAL LOW (ref >59)

## 2022-10-08 ENCOUNTER — Ambulatory Visit
Admission: RE | Admit: 2022-10-08 | Discharge: 2022-10-08 | Disposition: A | Discharge status: Home / Self Care | Payer: 59 | Source: Ambulatory Visit | Attending: Nurse Practitioner | Admitting: Nurse Practitioner

## 2022-10-08 DIAGNOSIS — Z1231 Encounter for screening mammogram for malignant neoplasm of breast: Secondary | ICD-10-CM

## 2022-10-10 ENCOUNTER — Other Ambulatory Visit: Payer: Self-pay | Admitting: Nurse Practitioner

## 2022-10-10 DIAGNOSIS — R928 Other abnormal and inconclusive findings on diagnostic imaging of breast: Secondary | ICD-10-CM

## 2022-10-14 ENCOUNTER — Other Ambulatory Visit: Payer: Self-pay | Admitting: Nurse Practitioner

## 2022-10-14 DIAGNOSIS — R519 Headache, unspecified: Secondary | ICD-10-CM

## 2022-10-15 ENCOUNTER — Other Ambulatory Visit: Payer: Self-pay | Admitting: Nurse Practitioner

## 2022-10-15 DIAGNOSIS — I10 Essential (primary) hypertension: Secondary | ICD-10-CM

## 2022-10-15 DIAGNOSIS — E118 Type 2 diabetes mellitus with unspecified complications: Secondary | ICD-10-CM

## 2022-10-16 NOTE — Telephone Encounter (Signed)
Requested Prescriptions  Pending Prescriptions Disp Refills   metFORMIN (GLUCOPHAGE) 1000 MG tablet [Pharmacy Med Name: metFORMIN HCl 1000 MG Oral Tablet] 180 tablet 0    Sig: TAKE 1 TABLET BY MOUTH TWICE  DAILY WITH MEALS     Endocrinology:  Diabetes - Biguanides Failed - 10/15/2022 10:15 PM      Failed - Cr in normal range and within 360 days    Creat  Date Value Ref Range Status  06/06/2020 0.84 0.50 - 0.99 mg/dL Final    Comment:    For patients >23 years of age, the reference limit for Creatinine is approximately 13% higher for people identified as African-American. .    Creatinine, Ser  Date Value Ref Range Status  09/24/2022 1.10 (H) 0.57 - 1.00 mg/dL Final   Creatinine, Urine  Date Value Ref Range Status  06/06/2020 119 20 - 275 mg/dL Final         Failed - eGFR in normal range and within 360 days    GFR calc Af Amer  Date Value Ref Range Status  02/12/2020 >60 >60 mL/min Final   GFR calc non Af Amer  Date Value Ref Range Status  02/12/2020 >60 >60 mL/min Final   eGFR  Date Value Ref Range Status  09/24/2022 56 (L) >59 mL/min/1.73 Final         Failed - CBC within normal limits and completed in the last 12 months    WBC  Date Value Ref Range Status  09/12/2021 5.9 3.4 - 10.8 x10E3/uL Final  06/06/2020 5.1 3.8 - 10.8 Thousand/uL Final   RBC  Date Value Ref Range Status  09/12/2021 4.63 3.77 - 5.28 x10E6/uL Final  06/06/2020 4.29 3.80 - 5.10 Million/uL Final   Hemoglobin  Date Value Ref Range Status  09/12/2021 11.6 11.1 - 15.9 g/dL Final   Hematocrit  Date Value Ref Range Status  09/12/2021 36.5 34.0 - 46.6 % Final   MCHC  Date Value Ref Range Status  09/12/2021 31.8 31.5 - 35.7 g/dL Final  06/06/2020 31.1 (L) 32.0 - 36.0 g/dL Final   [facility]  Date Value Ref Range Status  09/12/2021 25.1 (L) 26.6 - 33.0 pg Final  06/06/2020 25.4 (L) 27.0 - 33.0 pg Final   MCV  Date Value Ref Range Status  09/12/2021 79 79 - 97 fL Final   No results found  for: "PLTCOUNTKUC", "LABPLAT", "POCPLA" RDW  Date Value Ref Range Status  09/12/2021 14.8 11.7 - 15.4 % Final         Passed - HBA1C is between 0 and 7.9 and within 180 days    Hemoglobin A1C  Date Value Ref Range Status  07/26/2016 6.2  Final    Comment:    [facility] Access home testing/[employer]   HbA1c, POC (controlled diabetic range)  Date Value Ref Range Status  05/30/2022 6.5 0.0 - 7.0 % Final         Passed - B12 Level in normal range and within 720 days    Vitamin B-12  Date Value Ref Range Status  09/12/2021 343 232 - 1,245 pg/mL Final         Passed - Valid encounter within last 6 months    Recent Outpatient Visits           1 month ago Primary hypertension   [place], NP   4 months ago Essential hypertension   [place], [place]  W, NP   10 months ago Controlled type 2 diabetes mellitus with complication, without long-term current use of insulin [facility])   [place], Vernia Buff, NP   1 year ago Encounter for Papanicolaou smear for cervical cancer screening   [place], Vernia Buff, NP   1 year ago Essential hypertension   [place] Gildardo Pounds, NP       Future Appointments             In 1 month Gildardo Pounds, NP [place]             nebivolol (BYSTOLIC) 10 MG tablet [Pharmacy Med Name: Nebivolol HCl 10 MG Oral Tablet] 90 tablet 0    Sig: TAKE 1 TABLET BY MOUTH DAILY     Cardiovascular: Beta Blockers 3 Failed - 10/15/2022 10:15 PM      Failed - Cr in normal range and within 360 days    Creat  Date Value Ref Range Status  06/06/2020 0.84 0.50 - 0.99 mg/dL Final    Comment:    For patients >17 years of age, the reference limit for Creatinine is approximately 13% higher for people identified as  African-American. .    Creatinine, Ser  Date Value Ref Range Status  09/24/2022 1.10 (H) 0.57 - 1.00 mg/dL Final   Creatinine, Urine  Date Value Ref Range Status  06/06/2020 119 20 - 275 mg/dL Final         Passed - AST in normal range and within 360 days    AST  Date Value Ref Range Status  09/24/2022 15 0 - 40 IU/L Final         Passed - ALT in normal range and within 360 days    ALT  Date Value Ref Range Status  09/24/2022 13 0 - 32 IU/L Final         Passed - Last BP in normal range    BP Readings from Last 1 Encounters:  08/31/22 135/72         Passed - Last Heart Rate in normal range    Pulse Readings from Last 1 Encounters:  08/31/22 64         Passed - Valid encounter within last 6 months    Recent Outpatient Visits           1 month ago Primary hypertension   [place], Vernia Buff, NP   4 months ago Essential hypertension   [place], [place] W, NP   10 months ago Controlled type 2 diabetes mellitus with complication, without long-term current use of insulin [facility])   [place], Vernia Buff, NP   1 year ago Encounter for Papanicolaou smear for cervical cancer screening   [place], Vernia Buff, NP   1 year ago Essential hypertension   [place] Gildardo Pounds, NP       Future Appointments             In 1 month Gildardo Pounds, NP [place]             chlorthalidone (HYGROTON) 25 MG tablet [Pharmacy Med Name: Chlorthalidone 25 MG Oral Tablet]  90 tablet 0    Sig: TAKE 1 TABLET BY MOUTH DAILY     Cardiovascular: Diuretics - Thiazide Failed - 10/15/2022 10:15 PM      Failed - Cr in normal range and within 180 days    Creat  Date Value Ref Range Status  06/06/2020 0.84 0.50 - 0.99 mg/dL Final    Comment:     For patients >57 years of age, the reference limit for Creatinine is approximately 13% higher for people identified as African-American. .    Creatinine, Ser  Date Value Ref Range Status  09/24/2022 1.10 (H) 0.57 - 1.00 mg/dL Final   Creatinine, Urine  Date Value Ref Range Status  06/06/2020 119 20 - 275 mg/dL Final         Passed - K in normal range and within 180 days    Potassium  Date Value Ref Range Status  09/24/2022 4.1 3.5 - 5.2 mmol/L Final         Passed - Na in normal range and within 180 days    Sodium  Date Value Ref Range Status  09/24/2022 140 134 - 144 mmol/L Final         Passed - Last BP in normal range    BP Readings from Last 1 Encounters:  08/31/22 135/72         Passed - Valid encounter within last 6 months    Recent Outpatient Visits           1 month ago Primary hypertension   [place], Vernia Buff, NP   4 months ago Essential hypertension   [place], [place] W, NP   10 months ago Controlled type 2 diabetes mellitus with complication, without long-term current use of insulin [facility])   [place], Vernia Buff, NP   1 year ago Encounter for Papanicolaou smear for cervical cancer screening   [place], Vernia Buff, NP   1 year ago Essential hypertension   [place] Gildardo Pounds, NP       Future Appointments             In 1 month Gildardo Pounds, NP [place]

## 2022-10-18 ENCOUNTER — Other Ambulatory Visit: Payer: Self-pay | Admitting: Nurse Practitioner

## 2022-10-18 ENCOUNTER — Ambulatory Visit
Admission: RE | Admit: 2022-10-18 | Discharge: 2022-10-18 | Disposition: A | Discharge status: Home / Self Care | Payer: 59 | Source: Ambulatory Visit | Attending: Nurse Practitioner | Admitting: Nurse Practitioner

## 2022-10-18 DIAGNOSIS — R928 Other abnormal and inconclusive findings on diagnostic imaging of breast: Secondary | ICD-10-CM

## 2022-10-18 DIAGNOSIS — N6325 Unspecified lump in the left breast, overlapping quadrants: Secondary | ICD-10-CM | POA: Diagnosis not present

## 2022-10-18 DIAGNOSIS — N6320 Unspecified lump in the left breast, unspecified quadrant: Secondary | ICD-10-CM

## 2022-10-22 ENCOUNTER — Ambulatory Visit (INDEPENDENT_AMBULATORY_CARE_PROVIDER_SITE_OTHER): Payer: 59 | Admitting: Podiatry

## 2022-10-22 ENCOUNTER — Encounter: Payer: Self-pay | Admitting: Podiatry

## 2022-10-22 DIAGNOSIS — M79674 Pain in right toe(s): Secondary | ICD-10-CM

## 2022-10-22 DIAGNOSIS — E1143 Type 2 diabetes mellitus with diabetic autonomic (poly)neuropathy: Secondary | ICD-10-CM

## 2022-10-22 DIAGNOSIS — M79675 Pain in left toe(s): Secondary | ICD-10-CM | POA: Diagnosis not present

## 2022-10-22 DIAGNOSIS — B351 Tinea unguium: Secondary | ICD-10-CM | POA: Diagnosis not present

## 2022-10-22 DIAGNOSIS — L84 Corns and callosities: Secondary | ICD-10-CM | POA: Diagnosis not present

## 2022-10-22 NOTE — Progress Notes (Signed)
  Subjective:  Patient ID: Autumn Carson, female    DOB: [birth year],  MRN: [id number]  Autumn Carson presents to clinic today for at risk foot care with history of diabetic neuropathy and painful elongated mycotic toenails 1-5 bilaterally which are tender when wearing enclosed shoe gear. Pain is relieved with periodic professional debridement.   She relates left great toe is tender at side where she had ingrown toenail removed. She denies any redness, drainage or swelling.  Chief Complaint  Patient presents with   Nail Problem    Nail trim    PCP is Gildardo Pounds, NP.  Allergies  Allergen Reactions   Lisinopril Anaphylaxis    angioedema   Other     Other reaction(s): throat swelling    Review of Systems: Negative except as noted in the HPI.  Objective: No changes noted in today's physical examination.  There were no vitals filed for this visit. Autumn Carson is a pleasant 67 y.o. female morbidly obese in NAD. AAO x 3.  Vascular Examination: Palpable pedal pulses b/l LE. Digital hair present b/l. No pedal edema b/l. Skin temperature gradient WNL b/l. No varicosities b/l. CFT <3 seconds b/l LE. Pedal hair present..  Dermatological Examination: Pedal skin with normal turgor, texture and tone b/l. No open wounds. No interdigital macerations b/l. Toenails 1-5 b/l thickened, discolored, dystrophic with subungual debris. There is pain on palpation to dorsal aspect of nailplates.   Hyperkeratotic lesion(s) subungually at medial border left hallux.  There is tenderness to palpation. No erythema, no edema, no drainage, no fluctuance.  Neurological Examination: Protective sensation intact with 10 gram monofilament b/l LE. Vibratory sensation intact b/l LE. Pt has subjective symptoms of neuropathy.  Musculoskeletal Examination: Muscle strength 5/5 to all LE muscle groups b/l. No gross bony deformities bilaterally. Patient ambulates independent of any  assistive aids.  Assessment/Plan: No diagnosis found.   No orders of the defined types were placed in this encounter.   -Patient was evaluated and treated. All patient's and/or POA's questions/concerns answered on today's visit. -Mycotic toenails 1-5 bilaterally were debrided in length and girth with sterile nail nippers and dremel without incident. -Callus(es) left great toe pared utilizing sterile scalpel blade without complication or incident. Total number debrided =1.  -Patient/POA to call should there be question/concern in the interim.   No follow-ups on file.  Lorenda Peck, DPM

## 2022-10-23 ENCOUNTER — Ambulatory Visit
Admission: RE | Admit: 2022-10-23 | Discharge: 2022-10-23 | Disposition: A | Discharge status: Home / Self Care | Payer: 59 | Source: Ambulatory Visit | Attending: Nurse Practitioner | Admitting: Nurse Practitioner

## 2022-10-23 DIAGNOSIS — R928 Other abnormal and inconclusive findings on diagnostic imaging of breast: Secondary | ICD-10-CM | POA: Diagnosis not present

## 2022-10-23 DIAGNOSIS — N6320 Unspecified lump in the left breast, unspecified quadrant: Secondary | ICD-10-CM

## 2022-10-23 DIAGNOSIS — D242 Benign neoplasm of left breast: Secondary | ICD-10-CM | POA: Diagnosis not present

## 2022-10-23 DIAGNOSIS — N6321 Unspecified lump in the left breast, upper outer quadrant: Secondary | ICD-10-CM | POA: Diagnosis not present

## 2022-10-23 DIAGNOSIS — N6042 Mammary duct ectasia of left breast: Secondary | ICD-10-CM | POA: Diagnosis not present

## 2022-10-23 HISTORY — PX: BREAST BIOPSY: SHX20

## 2022-10-27 ENCOUNTER — Other Ambulatory Visit: Payer: Self-pay | Admitting: Nurse Practitioner

## 2022-10-27 DIAGNOSIS — E890 Postprocedural hypothyroidism: Secondary | ICD-10-CM

## 2022-10-29 NOTE — Telephone Encounter (Signed)
Unable to refill per protocol, Rx request is too soon. Last refill 08/22/22 for 90 days and 1 refill.  Requested Prescriptions  Pending Prescriptions Disp Refills   levothyroxine (SYNTHROID) 175 MCG tablet [Pharmacy Med Name: Levothyroxine Sodium 175 MCG Oral Tablet] 100 tablet 2    Sig: TAKE 1 TABLET BY MOUTH DAILY  BEFORE BREAKFAST     Endocrinology:  Hypothyroid Agents Passed - 10/27/2022 10:05 PM      Passed - TSH in normal range and within 360 days    TSH  Date Value Ref Range Status  05/30/2022 0.523 0.450 - 4.500 uIU/mL Final         Passed - Valid encounter within last 12 months    Recent Outpatient Visits           1 month ago Primary hypertension   [place], NP   5 months ago Essential hypertension   [place], [place] W, NP   11 months ago Controlled type 2 diabetes mellitus with complication, without long-term current use of insulin [facility])   [place], Vernia Buff, NP   1 year ago Encounter for Papanicolaou smear for cervical cancer screening   [place], Vernia Buff, NP   1 year ago Essential hypertension   [place] Gildardo Pounds, NP       Future Appointments             In 1 month Gildardo Pounds, NP [place]

## 2022-10-30 ENCOUNTER — Other Ambulatory Visit: Payer: Self-pay | Admitting: Nurse Practitioner

## 2022-10-30 DIAGNOSIS — I10 Essential (primary) hypertension: Secondary | ICD-10-CM

## 2022-10-30 NOTE — Telephone Encounter (Signed)
Requested Prescriptions  Pending Prescriptions Disp Refills   amLODipine (NORVASC) 10 MG tablet [Pharmacy Med Name: amLODIPine Besylate 10 MG Oral Tablet] 100 tablet 0    Sig: TAKE 1 TABLET BY MOUTH DAILY     Cardiovascular: Calcium Channel Blockers 2 Passed - 10/30/2022  7:56 AM      Passed - Last BP in normal range    BP Readings from Last 1 Encounters:  08/31/22 135/72         Passed - Last Heart Rate in normal range    Pulse Readings from Last 1 Encounters:  08/31/22 64         Passed - Valid encounter within last 6 months    Recent Outpatient Visits           2 months ago Primary hypertension   [place], Vernia Buff, NP   5 months ago Essential hypertension   [place], [place] W, NP   11 months ago Controlled type 2 diabetes mellitus with complication, without long-term current use of insulin [facility])   [place], Vernia Buff, NP   1 year ago Encounter for Papanicolaou smear for cervical cancer screening   [place], Vernia Buff, NP   1 year ago Essential hypertension   [place] Gildardo Pounds, NP       Future Appointments             In 1 month Gildardo Pounds, NP [place]

## 2022-11-07 ENCOUNTER — Encounter: Payer: Self-pay | Admitting: *Deleted

## 2022-11-10 ENCOUNTER — Other Ambulatory Visit: Payer: Self-pay | Admitting: Nurse Practitioner

## 2022-11-10 DIAGNOSIS — E890 Postprocedural hypothyroidism: Secondary | ICD-10-CM

## 2022-11-13 ENCOUNTER — Ambulatory Visit: Payer: Self-pay | Admitting: Surgery

## 2022-11-13 DIAGNOSIS — D242 Benign neoplasm of left breast: Secondary | ICD-10-CM

## 2022-11-13 NOTE — H&P (Signed)
Subjective   Chief Complaint: New Consultation (Left breast intraductal papilloma)     History of Present Illness: Autumn Carson is a 67 y.o. female who is seen today as an office consultation at the request of Dr. Meredeth Ide for evaluation of New Consultation (Left breast intraductal papilloma) .   This is a 67 year old female with type 2 diabetes who presents after routine screening mammogram recently revealed a possible left breast mass.  She underwent further workup including diagnostic mammogram and ultrasound.  In the left breast at 3:00, 6 cm from the nipple there is a 8 x 6 x 3 mm mass.  This area was biopsied and revealed intraductal papilloma.  In the retroareolar region at 6:00, there is a dilated duct that was biopsied and showed only duct ectasia but no sign of malignancy.  She presents now to discuss excision of the intraductal papilloma.  Review of Systems: A complete review of systems was obtained from the patient.  I have reviewed this information and discussed as appropriate with the patient.  See HPI as well for other ROS.  Review of Systems  Constitutional:  Positive for weight loss.  HENT: Negative.    Eyes: Negative.   Respiratory: Negative.    Cardiovascular: Negative.   Gastrointestinal: Negative.   Genitourinary: Negative.   Musculoskeletal: Negative.   Skin:  Positive for itching.  Neurological:  Positive for weakness.  Endo/Heme/Allergies: Negative.   Psychiatric/Behavioral:  Positive for depression. The patient is nervous/anxious.       Medical History: Past Medical History:  Diagnosis Date   Anxiety    Arthritis    Diabetes mellitus without complication (CMS/HHS-HCC)    GERD (gastroesophageal reflux disease)    History of cancer    Hyperlipidemia    Hypertension    Thyroid disease     Patient Active Problem List  Diagnosis   Chronic anemia, unspecified   Class 3 obesity (CMS/HHS-HCC)   DDD (degenerative disc disease), lumbar    Depression with anxiety   Diabetic neuropathy (CMS/HHS-HCC)   Esophageal reflux   Essential hypertension   History of papillary adenocarcinoma of thyroid   Hyperlipidemia   Hypothyroidism   Lumbar foraminal stenosis   OA (osteoarthritis) of knee   Spondylolisthesis   Type 2 diabetes mellitus (CMS/HHS-HCC)    Past Surgical History:  Procedure Laterality Date   TONSILLECTOMY & ADENOIDECTOMY  1987   FOOT SURGERY  Left 1999   THYROIDECTOMY TOTAL  2011   EGD  2014   2017   COLONOSCOPY  2014   BACK SURGERY      BREAST BIOPSY SURGERY  Left    x 2 2014, 2024   CARPAL TUNNEL SURGEY     CESAREAN SECTION     CHOLECYSTECTOMY     ELBOW SURGERY  Bilateral    LAPAROSCOPIC TUBAL LIGATION     SHOULDER SURGERY  Bilateral      Allergies  Allergen Reactions   Lisinopril Anaphylaxis    angioedema   Other Other (See Comments)    Other reaction(s): throat swelling    Current Outpatient Medications on File Prior to Visit  Medication Sig Dispense Refill   albuterol MDI, PROVENTIL, VENTOLIN, PROAIR, HFA 90 mcg/actuation inhaler Inhale into the lungs     amitriptyline (ELAVIL) 10 MG tablet      amLODIPine (NORVASC) 10 MG tablet      atorvastatin (LIPITOR) 40 MG tablet      chlorthalidone 25 MG tablet      hydrALAZINE (APRESOLINE) 10 MG  tablet      levothyroxine (SYNTHROID) 175 MCG tablet TAKE 1 TABLET BY MOUTH EVERY DAY IN THE MORNING ON AN EMPTY STOMACH     metFORMIN (GLUCOPHAGE) 1000 MG tablet      nebivoloL (BYSTOLIC) 10 MG tablet      omeprazole (PRILOSEC) 40 MG DR capsule      pregabalin (LYRICA) 50 MG capsule Take 1 capsule by mouth at bedtime     zolpidem (AMBIEN) 5 MG tablet      FLUoxetine (PROZAC) 40 MG capsule      No current facility-administered medications on file prior to visit.    History reviewed. No pertinent family history.   Social History   Tobacco Use  Smoking Status Never  Smokeless Tobacco Never     Social History   Socioeconomic History   Marital  status: Divorced  Tobacco Use   Smoking status: Never   Smokeless tobacco: Never  Substance and Sexual Activity   Alcohol use: Yes   Drug use: Never   Social Determinants of Health   Financial Resource Strain: Low Risk  (06/01/2022)   Received from [facility]   Overall Financial Resource Strain (CARDIA)    Difficulty of Paying Living Expenses: Not hard at all  Food Insecurity: No Food Insecurity (06/01/2022)   [facility]   Hunger Vital Sign    Worried About Running Out of Food in the Last Year: Never true    Ran Out of Food in the Last Year: Never true  Transportation Needs: No Transportation Needs (06/01/2022)   Received from [facility] - Transportation    Lack of Transportation (Medical): No    Lack of Transportation (Non-Medical): No  Physical Activity: Insufficiently Active (06/01/2022)   [facility] from [facility]   Exercise Vital Sign    Days of Exercise per Week: 3 days    Minutes of Exercise per Session: 20 min  Stress: Stress Concern Present (06/01/2022)   Received from [facility] - Occupational Stress Questionnaire    Feeling of Stress : To some extent  Social Connections: Moderately Isolated (06/01/2022)   Received from [facility]   Social Connection and Isolation Panel [NHANES]    Frequency of Communication with Friends and Family: More than three times a week    Frequency of Social Gatherings with Friends and Family: More than three times a week    Attends Religious Services: More than 4 times per year    Active Member of [employer] or Organizations: No    Attends Banker Meetings: Never    Marital Status: Separated    Objective:    Vitals:   11/13/22 1517  PainSc: 0-No pain  PainLoc: Breast      Physical Exam   Constitutional:  WDWN in NAD, conversant, no obvious deformities; lying in bed comfortably Eyes:  Pupils equal, round; sclera anicteric; moist conjunctiva; no lid  lag HENT:  Oral mucosa moist; good dentition  Neck:  No masses palpated, trachea midline; no thyromegaly Lungs:  CTA bilaterally; normal respiratory effort Breasts:  symmetric, no nipple changes; no palpable masses or lymphadenopathy on right side Left breast shows some chronic central nipple retraction.  Palpable mass located at 3:00 about 6 cm from the nipple just underneath the skin.  This mass measures approximately 1 cm in diameter to palpation.  No overlying skin changes.  No axillary lymphadenopathy on the left.  No other palpable masses on the  left. CV:  Regular rate and rhythm; no murmurs; extremities well-perfused with no edema Abd:  +bowel sounds, soft, non-tender, no palpable organomegaly; no palpable hernias Musc:  Unable to assess gait; no apparent clubbing or cyanosis in extremities Lymphatic:  No palpable cervical or axillary lymphadenopathy Skin:  Warm, dry; no sign of jaundice Psychiatric - alert and oriented x 4; calm mood and affect   Labs, Imaging and Diagnostic Testing: Diagnosis 1. Breast, left, needle core biopsy, 3 o'clock, ribbon - INTRADUCTAL PAPILLOMA, SEE NOTE 2. Breast, left, needle core biopsy, ducts, 6 o'clock, coil - BENIGN DUCT ECTASIA, SEE NOTE - NEGATIVE FOR CARCINOMA Diagnosis Note 1. and 2. [facility] was notified on 10/24/2022. Holley Bouche MD Pathologist, Electronic Signature (Case signed 10/24/2022)  CLINICAL DATA:  Screening recall for a possible left breast asymmetry.   EXAM: DIGITAL DIAGNOSTIC UNILATERAL LEFT MAMMOGRAM WITH TOMOSYNTHESIS; ULTRASOUND LEFT BREAST LIMITED   TECHNIQUE: Left digital diagnostic mammography and breast tomosynthesis was performed.; Targeted ultrasound examination of the left breast was performed.   COMPARISON:  Previous exam(s).   ACR Breast Density Category a: The breasts are almost entirely fatty.   FINDINGS: On spot compression imaging, the possible asymmetry noted on  the current screening exam in the anterior, lateral left breast persists as an oval, partly circumscribed, 9 mm mass. There is also a mildly dilated inferior retroareolar left breast duct that appears new since the prior mammograms.   On physical exam, left nipple is partly inverted. Patient does not know the chronicity of this.   Targeted ultrasound is performed, showing an oval, circumscribed, mildly hypoechoic mass in the left breast at 3 o'clock, 6 cm the nipple, relatively superficial, measuring 8 x 3 x 6 mm, consistent in size, shape and location to the mammographic mass. In the 6 o'clock, retroareolar left breast, there is a dilated duct that contains internal echoes, but no internal blood flow. The internal echoes are favored to be debris. Duct measures up to 3 mm in thickness.   Sonographic assessment of the left axilla shows few prominent lymph nodes cortical thickness is up to 3.7 mm. These are most likely reactive.   IMPRESSION: 1. Indeterminate 8 mm mass in the left breast at 3 o'clock, 6 cm the nipple. Although the mass has benign imaging characteristics, it appears new from prior mammograms. 2. Dilated infra-areolar left breast duct containing internal echoes suspected to be debris. This also appears to be a change from prior mammograms. 3. Prominent left axillary lymph nodes suspected to be reactive/benign.   RECOMMENDATION: 1. Ultrasound-guided core needle biopsy of the 3 o'clock position, 8 mm left breast mass. 2. Ultrasound-guided core needle biopsy dilated inferior retroareolar left breast duct. 3. If either of these 2 biopsies demonstrates malignancy, then biopsy of the left axillary lymph node with the thickest cortex would be recommended.   I have discussed the findings and recommendations with the patient. If applicable, a reminder letter will be sent to the patient regarding the next appointment.   BI-RADS CATEGORY  4: Suspicious.      Electronically Signed   By: Amie [place] M.D.   On: 10/18/2022 11:35  Assessment and Plan:  Diagnoses and all orders for this visit:  Intraductal papilloma of breast, left    After discussion with patient regarding her options, we recommend left breast radioactive seed localized lumpectomy to remove the intraductal papilloma.The surgical procedure has been discussed with the patient.  Potential risks, benefits, alternative treatments, and expected outcomes  have been explained.  All of the patient's questions at this time have been answered.  The likelihood of reaching the patient's treatment goal is good.  The patient understand the proposed surgical procedure and wishes to proceed.    Lissa Morales, MD  11/13/2022 3:27 PM

## 2022-11-14 ENCOUNTER — Other Ambulatory Visit: Payer: Self-pay | Admitting: Surgery

## 2022-11-14 DIAGNOSIS — D242 Benign neoplasm of left breast: Secondary | ICD-10-CM

## 2022-11-19 DIAGNOSIS — E890 Postprocedural hypothyroidism: Secondary | ICD-10-CM | POA: Diagnosis not present

## 2022-11-19 DIAGNOSIS — Z85850 Personal history of malignant neoplasm of thyroid: Secondary | ICD-10-CM | POA: Diagnosis not present

## 2022-11-26 DIAGNOSIS — E890 Postprocedural hypothyroidism: Secondary | ICD-10-CM | POA: Diagnosis not present

## 2022-12-03 ENCOUNTER — Encounter: Payer: Self-pay | Admitting: *Deleted

## 2022-12-03 ENCOUNTER — Ambulatory Visit: Payer: 59 | Attending: Nurse Practitioner | Admitting: Nurse Practitioner

## 2022-12-03 ENCOUNTER — Encounter: Payer: Self-pay | Admitting: Nurse Practitioner

## 2022-12-03 VITALS — BP 138/71 | HR 58 | Ht 65.0 in | Wt 306.0 lb

## 2022-12-03 DIAGNOSIS — D649 Anemia, unspecified: Secondary | ICD-10-CM | POA: Diagnosis not present

## 2022-12-03 DIAGNOSIS — Z1211 Encounter for screening for malignant neoplasm of colon: Secondary | ICD-10-CM | POA: Diagnosis not present

## 2022-12-03 DIAGNOSIS — E1143 Type 2 diabetes mellitus with diabetic autonomic (poly)neuropathy: Secondary | ICD-10-CM

## 2022-12-03 DIAGNOSIS — I10 Essential (primary) hypertension: Secondary | ICD-10-CM

## 2022-12-03 DIAGNOSIS — E785 Hyperlipidemia, unspecified: Secondary | ICD-10-CM

## 2022-12-03 LAB — POCT GLYCOSYLATED HEMOGLOBIN (HGB A1C): HbA1c, POC (controlled diabetic range): 6.2 % (ref 0.0–7.0)

## 2022-12-03 NOTE — Progress Notes (Signed)
Assessment & Plan:  Autumn Carson was seen today for hypertension.  Diagnoses and all orders for this visit:  Type 2 diabetes mellitus with diabetic autonomic neuropathy, without long-term current use of insulin (HCC) -     POCT glycosylated hemoglobin (Hb A1C) Continue blood sugar control as discussed in office today, low carbohydrate diet, and regular physical exercise as tolerated, 150 minutes per week (30 min each day, 5 days per week, or 50 min 3 days per week). Keep blood sugar logs with fasting goal of 90-130 mg/dl, post prandial (after you eat) less than 180.  For Hypoglycemia: BS <60 and Hyperglycemia BS >400; contact the clinic ASAP. Annual eye exams and foot exams are recommended.   Primary hypertension Continue all antihypertensives as prescribed.  Reminded to bring in blood pressure log for follow  up appointment.  RECOMMENDATIONS: DASH/Mediterranean Diets are healthier choices for HTN.    Dyslipidemia, goal LDL below 70 -     Lipid panel INSTRUCTIONS: Work on a low fat, heart healthy diet and participate in regular aerobic exercise program by working out at least 150 minutes per week; 5 days a week-30 minutes per day. Avoid red meat/beef/steak,  fried foods. junk foods, sodas, sugary drinks, unhealthy snacking, alcohol and smoking.  Drink at least 80 oz of water per day and monitor your carbohydrate intake daily.    Low hemoglobin -     CBC with Differential  Colon cancer screening -     Ambulatory referral to Gastroenterology     Patient has been counseled on age-appropriate routine health concerns for screening and prevention. These are reviewed and up-to-date. Referrals have been placed accordingly. Immunizations are up-to-date or declined.    Subjective:   Chief Complaint  Patient presents with   Hypertension   Hypertension Pertinent negatives include no blurred vision, chest pain, headaches, malaise/fatigue, palpitations or shortness of breath.   Autumn Carson 67 y.o. female presents to office today follow up to HTN  Patient has been counseled on age-appropriate routine health concerns for screening and prevention. These are reviewed and up-to-date. Referrals have been placed accordingly. Immunizations are up-to-date or declined.     Pap smear: UTD MAMMOGRAM: UTD COLONOSCOPY: UTD   He has a past medical history of Anxiety, primary osteo arthritis of both knees (followed by Ortho), Back pain, THYROID Cancer with post surgical hypothyroidism (followed by Endocrinology Dr. Sharl Ma), Chronic anemia, Depression (1998), Essential hypertension, GERD, History of thyroid cancer (2011), Hypothyroidism, Lumbar spinal stenosis, MORBID Obesity, PONV, Seasonal allergies, and Type 2 diabetes mellitus with diabetic neuropathy.     HTN Blood pressure is well controlled today.  She endorses adherence taking amlodipine 10 mg daily, chlorthalidone 25 mg daily, hydralazine 10 mg 3 times a day and Bystolic 10 mg daily.  BP Readings from Last 3 Encounters:  12/03/22 138/71  08/31/22 135/72  05/30/22 130/78    DM 2 Well controlled. She is taking metformin 1000 mg bid. Complications of diabetes include peripheral neuropathy. LDL not quite at goal with atorvastatin 40 mg daily.   Lab Results  Component Value Date   HGBA1C 6.2 12/03/2022    Lab Results  Component Value Date   LDLCALC 82 09/12/2021    Review of Systems  Constitutional:  Negative for fever, malaise/fatigue and weight loss.  HENT: Negative.  Negative for nosebleeds.   Eyes: Negative.  Negative for blurred vision, double vision and photophobia.  Respiratory: Negative.  Negative for cough and shortness of breath.   Cardiovascular: Negative.  Negative for chest pain, palpitations and leg swelling.  Gastrointestinal: Negative.  Negative for heartburn, nausea and vomiting.  Musculoskeletal: Negative.  Negative for myalgias.  Neurological: Negative.  Negative for dizziness, focal weakness,  seizures and headaches.  Psychiatric/Behavioral: Negative.  Negative for suicidal ideas.     Past Medical History:  Diagnosis Date   Anemia    Anxiety    Arthritis of both knees    Back pain    Back pain    Bilateral swelling of feet    Cancer (HCC)    Thyroid   Carpal tunnel syndrome    Chest pain    Chronic anemia    Congestive heart failure (CHF) (HCC)    Constipation    Depression 1998   Essential hypertension    Fatty liver    GERD (gastroesophageal reflux disease)    Glaucoma    High cholesterol    History of thyroid cancer 2011   Papillary thyroid cancer s/p radiation   Hypertension    Hypothyroidism    Joint pain    Lumbar spinal stenosis    Obesity    Osteoarthritis    Palpitations    PONV (postoperative nausea and vomiting)    Rheumatoid arthritis (HCC)    Seasonal allergies    Swallowing difficulty    Type 2 diabetes mellitus with diabetic neuropathy (HCC)     Past Surgical History:  Procedure Laterality Date   BACK SURGERY     BIOPSY N/A 01/16/2013   Procedure: BIOPSY;  Surgeon: West Bali, MD;  [facility];  Service: Endoscopy;  Laterality: N/A;  SMALL BOWEL ULCERS   BRAVO PH STUDY N/A 11/22/2015   Procedure: BRAVO PH STUDY;  Surgeon: West Bali, MD;  [facility];  Service: Endoscopy;  Laterality: N/A;   BREAST BIOPSY Left 10/23/2022   [place] LT BREAST BX W LOC DEV EA ADD LESION IMG BX SPEC US GUIDE 10/23/2022 [facility]   BREAST BIOPSY Left 10/23/2022   [place] LT BREAST BX W LOC DEV 1ST LESION IMG BX SPEC US GUIDE 10/23/2022 [facility]   CARPAL TUNNEL RELEASE     4 total, bilaterally   CESAREAN SECTION     CHOLECYSTECTOMY  1990's   COLONOSCOPY WITH ESOPHAGOGASTRODUODENOSCOPY (EGD) N/A 12/08/2012   SLF: 1. Normal mucosa in the terminal ileum 2. mild diverticulosis throughout the entire examined colon. 3. rectal bleeding due to moderate sized internal hemorrhoids.    ELBOW SURGERY Bilateral    ENTEROSCOPY N/A  01/16/2013   Procedure: ENTEROSCOPY;  Surgeon: West Bali, MD;  [facility];  Service: Endoscopy;  Laterality: N/A;  2:00   ESOPHAGOGASTRODUODENOSCOPY N/A 11/22/2015   Procedure: ESOPHAGOGASTRODUODENOSCOPY (EGD);  Surgeon: West Bali, MD;  [facility];  Service: Endoscopy;  Laterality: N/A;  130 - moved to 2:00 - office to notify pt   FOOT SURGERY Left 1999   bone spur   GIVENS CAPSULE STUDY N/A 12/31/2012   Procedure: GIVENS CAPSULE STUDY;  Surgeon: West Bali, MD;  [facility];  Service: Endoscopy;  Laterality: N/A;  7:30   ROTATOR CUFF REPAIR Bilateral    bilaterally   SAVORY DILATION N/A 11/22/2015   Procedure: SAVORY DILATION;  Surgeon: West Bali, MD;  [facility];  Service: Endoscopy;  Laterality: N/A;   SPINE SURGERY     Lumbar Fusion- Dr. Dutch Quint   TONSILECTOMY, ADENOIDECTOMY, BILATERAL MYRINGOTOMY AND TUBES  1987   TONSILLECTOMY  1987  TOTAL THYROIDECTOMY  2011   TUBAL LIGATION      Family History  Problem Relation Age of Onset   Stroke Mother    Hyperlipidemia Mother    Hypertension Mother    Heart disease Mother    Sudden death Mother    Alcoholism Mother    Cancer Father    Heart disease Father    Hyperlipidemia Father    Lung cancer Father        Age 77   Breast cancer Sister    Heart attack Daughter 66   Hypertension Daughter    Breast cancer Cousin    Colon cancer Brother        Age 35   Other Brother        Agent orange, age 103, cancer    Social History Reviewed with no changes to be made today.   Outpatient Medications Prior to Visit  Medication Sig Dispense Refill   amitriptyline (ELAVIL) 10 MG tablet TAKE 1 TABLET BY MOUTH AT  BEDTIME 100 tablet 0   amLODipine (NORVASC) 10 MG tablet TAKE 1 TABLET BY MOUTH DAILY 100 tablet 0   aspirin 81 MG tablet Take 81 mg by mouth every other day.     atorvastatin (LIPITOR) 40 MG tablet Take 1 tablet (40 mg total) by mouth daily. 90 tablet 3   Blood  Glucose Monitoring Suppl (TRUE METRIX AIR GLUCOSE METER) w/Device KIT USE AS DIRECTED 1 kit 0   celecoxib (CELEBREX) 100 MG capsule TAKE 1 CAPSULE(100 MG) BY MOUTH TWICE DAILY AS NEEDED FOR MODERATE PAIN 60 capsule 2   chlorthalidone (HYGROTON) 25 MG tablet TAKE 1 TABLET BY MOUTH DAILY 90 tablet 0   ciclopirox (PENLAC) 8 % solution Apply one coat to toenail qd for 48 weeks.  Remove weekly with polish remover. 6.6 mL 11   diclofenac Sodium (VOLTAREN) 1 % GEL      FLUoxetine (PROZAC) 40 MG capsule 1 capsule in the morning     hydrALAZINE (APRESOLINE) 10 MG tablet Take 1 tablet (10 mg total) by mouth 3 (three) times daily. 270 tablet 3   levothyroxine (SYNTHROID) 175 MCG tablet TAKE 1 TABLET BY MOUTH DAILY  BEFORE BREAKFAST 100 tablet 2   metFORMIN (GLUCOPHAGE) 1000 MG tablet TAKE 1 TABLET BY MOUTH TWICE  DAILY WITH MEALS 180 tablet 0   nebivolol (BYSTOLIC) 10 MG tablet TAKE 1 TABLET BY MOUTH DAILY 90 tablet 0   omeprazole (PRILOSEC) 40 MG capsule Take 1 capsule (40 mg total) by mouth daily. 180 capsule 3   pregabalin (LYRICA) 50 MG capsule TAKE 1 CAPSULE AT BEDTIME 30 capsule 0   TRUE METRIX BLOOD GLUCOSE TEST test strip CHECK FINGER STICK BLOOD SUGAR TWICE DAILY AS DIRECTED 200 strip 3   zolpidem (AMBIEN) 5 MG tablet TAKE 1 TABLET EVERY NIGHT AT BEDTIME AS NEEDED FOR SLEEP 30 tablet 3   acetaminophen-codeine (TYLENOL #3) 300-30 MG tablet Take 1 tablet by mouth daily as needed for moderate pain. (Patient not taking: Reported on 12/03/2022) 30 tablet 0   albuterol (VENTOLIN HFA) 108 (90 Base) MCG/ACT inhaler Inhale 1-2 puffs into the lungs every 6 (six) hours as needed for wheezing or shortness of breath. (Patient not taking: Reported on 12/03/2022) 6.7 g 0   cyclobenzaprine (FLEXERIL) 10 MG tablet 1 tablet as needed (Patient not taking: Reported on 12/03/2022)     Vitamin D, Ergocalciferol, (DRISDOL) 1.25 MG (50000 UNIT) CAPS capsule Take 1 capsule (50,000 Units total) by mouth every 7 (seven) days.  4  capsule 0   No facility-administered medications prior to visit.    Allergies  Allergen Reactions   Lisinopril Anaphylaxis    angioedema   Other Other (See Comments)    Other reaction(s): throat swelling       Objective:    BP 138/71 (BP Location: Left Arm, Patient Position: Sitting, Cuff Size: Large) Comment (Patient Position): arm elevated  Pulse (!) 58   Ht 5\' 5"  (1.651 m)   Wt (!) 306 lb (138.8 kg)   LMP 09/07/2011   SpO2 99%   BMI 50.92 kg/m  Wt Readings from Last 3 Encounters:  12/03/22 (!) 306 lb (138.8 kg)  08/31/22 (!) 305 lb (138.3 kg)  05/30/22 (!) 301 lb (136.5 kg)    Physical Exam Vitals and nursing note reviewed.  Constitutional:      Appearance: She is well-developed.  HENT:     Head: Normocephalic and atraumatic.  Cardiovascular:     Rate and Rhythm: Normal rate and regular rhythm.     Heart sounds: Normal heart sounds. No murmur heard.    No friction rub. No gallop.  Pulmonary:     Effort: Pulmonary effort is normal. No tachypnea or respiratory distress.     Breath sounds: Normal breath sounds. No decreased breath sounds, wheezing, rhonchi or rales.  Chest:     Chest wall: No tenderness.  Abdominal:     General: Bowel sounds are normal.     Palpations: Abdomen is soft.  Musculoskeletal:        General: Normal range of motion.     Cervical back: Normal range of motion.  Skin:    General: Skin is warm and dry.  Neurological:     Mental Status: She is alert and oriented to person, place, and time.     Coordination: Coordination normal.  Psychiatric:        Behavior: Behavior normal. Behavior is cooperative.        Thought Content: Thought content normal.        Judgment: Judgment normal.          Patient has been counseled extensively about nutrition and exercise as well as the importance of adherence with medications and regular follow-up. The patient was given clear instructions to go to ER or return to medical center if symptoms don't  improve, worsen or new problems develop. The patient verbalized understanding.   Follow-up: Return in about 3 months (around 03/05/2023).   Claiborne Rigg, FNP-BC [name] [place], [place] [phone]   12/03/2022, 8:54 AM

## 2022-12-03 NOTE — Progress Notes (Signed)
No concerns. 

## 2022-12-04 ENCOUNTER — Other Ambulatory Visit: Payer: Self-pay | Admitting: Family Medicine

## 2022-12-04 DIAGNOSIS — M170 Bilateral primary osteoarthritis of knee: Secondary | ICD-10-CM

## 2022-12-04 DIAGNOSIS — M5136 Other intervertebral disc degeneration, lumbar region: Secondary | ICD-10-CM

## 2022-12-04 LAB — CBC WITH DIFFERENTIAL/PLATELET
Basophils Absolute: 0 10*3/uL (ref 0.0–0.2)
Basos: 0 %
EOS (ABSOLUTE): 0.2 10*3/uL (ref 0.0–0.4)
Eos: 4 %
Hematocrit: 35.2 % (ref 34.0–46.6)
Hemoglobin: 11.3 g/dL (ref 11.1–15.9)
Immature Grans (Abs): 0 10*3/uL (ref 0.0–0.1)
Immature Granulocytes: 0 %
Lymphocytes Absolute: 1.8 10*3/uL (ref 0.7–3.1)
Lymphs: 33 %
MCH: 26.2 pg — ABNORMAL LOW (ref 26.6–33.0)
MCHC: 32.1 g/dL (ref 31.5–35.7)
MCV: 82 fL (ref 79–97)
Monocytes Absolute: 0.5 10*3/uL (ref 0.1–0.9)
Monocytes: 9 %
Neutrophils Absolute: 2.8 10*3/uL (ref 1.4–7.0)
Neutrophils: 54 %
Platelets: 261 10*3/uL (ref 150–450)
RBC: 4.31 x10E6/uL (ref 3.77–5.28)
RDW: 14.7 % (ref 11.7–15.4)
WBC: 5.3 10*3/uL (ref 3.4–10.8)

## 2022-12-04 LAB — LIPID PANEL
Chol/HDL Ratio: 5.2 ratio — ABNORMAL HIGH (ref 0.0–4.4)
Cholesterol, Total: 240 mg/dL — ABNORMAL HIGH (ref 100–199)
HDL: 46 mg/dL (ref >39)
LDL Chol Calc (NIH): 157 mg/dL — ABNORMAL HIGH (ref 0–99)
Triglycerides: 202 mg/dL — ABNORMAL HIGH (ref 0–149)
VLDL Cholesterol Cal: 37 mg/dL (ref 5–40)

## 2022-12-14 ENCOUNTER — Encounter (HOSPITAL_COMMUNITY): Payer: Self-pay

## 2022-12-14 NOTE — Pre-Procedure Instructions (Signed)
Surgical Instructions   Your procedure is scheduled on Tuesday, 12/25/22 at 7:30 AM.  Report to [facility] Main Entrance "A" at 5:30 AM, then check in with the Admitting office.  Call this number if you have problems the morning of surgery:  [phone]  If you have any questions prior to your surgery date call [phone]: Open Monday-Friday 8am-4pm If you experience any cold or flu symptoms such as cough, fever, chills, shortness of breath, etc. between now and your scheduled surgery, please notify us at the above number.    Remember:  Do not eat after midnight the night before your surgery  You may drink clear liquids until 4:30 AM the morning of your surgery.   Clear liquids allowed are: Water, Non-Citrus Juices (without pulp), Carbonated Beverages, Clear Tea, Black Coffee Only (NO MILK, CREAM OR POWDERED CREAMER of any kind), and Gatorade.  Patient Instructions  The night before surgery:  No food after midnight. ONLY clear liquids after midnight  The day of surgery (if you have diabetes): Drink ONE (1) 12 oz G2 given to you in your pre admission testing appointment by 04:30 AM the morning of surgery. Drink in one sitting. Do not sip.  This drink was given to you during your hospital  pre-op appointment visit.  Nothing else to drink after completing the  12 oz bottle of G2.         If you have questions, please contact your surgeon's office.     Take these medicines the morning of surgery with A SIP OF WATER: amLODipine (NORVASC)  atorvastatin (LIPITOR)  FLUoxetine (PROZAC)  gabapentin (NEURONTIN)  hydrALAZINE (APRESOLINE)  levothyroxine (SYNTHROID)  nebivolol (BYSTOLIC)  omeprazole (PRILOSEC)   Follow your surgeon's instructions on when to stop Aspirin.  If no instructions were given by your surgeon then you will need to call the office to get those instructions.     As of today, STOP taking any Aleve, Naproxen, Ibuprofen, celecoxib (CELEBREX), Motrin, Advil, Goody's,  BC's, all herbal medications, fish oil, diclofenac Sodium (VOLTAREN) gel and all vitamins.  WHAT DO I DO ABOUT MY DIABETES MEDICATION?  Do not take metFORMIN (GLUCOPHAGE) the morning of surgery.   HOW TO MANAGE YOUR DIABETES BEFORE AND AFTER SURGERY  Why is it important to control my blood sugar before and after surgery? Improving blood sugar levels before and after surgery helps healing and can limit problems. A way of improving blood sugar control is eating a healthy diet by:  Eating less sugar and carbohydrates  Increasing activity/exercise  Talking with your doctor about reaching your blood sugar goals High blood sugars (greater than 180 mg/dL) can raise your risk of infections and slow your recovery, so you will need to focus on controlling your diabetes during the weeks before surgery. Make sure that the doctor who takes care of your diabetes knows about your planned surgery including the date and location.  How do I manage my blood sugar before surgery? Check your blood sugar at least 4 times a day, starting 2 days before surgery, to make sure that the level is not too high or low.  Check your blood sugar the morning of your surgery when you wake up and every 2 hours until you get to the Short Stay unit.  If your blood sugar is less than 70 mg/dL, you will need to treat for low blood sugar: Do not take insulin. Treat a low blood sugar (less than 70 mg/dL) with  cup of clear juice (cranberry  or apple), 4 glucose tablets, OR glucose gel. Recheck blood sugar in 15 minutes after treatment (to make sure it is greater than 70 mg/dL). If your blood sugar is not greater than 70 mg/dL on recheck, call [phone] for further instructions. Report your blood sugar to the short stay nurse when you get to Short Stay.  If you are admitted to the hospital after surgery: Your blood sugar will be checked by the staff and you will probably be given insulin after surgery (instead of oral  diabetes medicines) to make sure you have good blood sugar levels. The goal for blood sugar control after surgery is 80-180 mg/dL.                   Contacts, glasses, piercing's, hearing aid's, dentures or partials may not be worn into surgery, please bring cases for these belongings.    Patients discharged the day of surgery will not be allowed to drive home, and someone needs to stay with them for 24 hours.  SURGICAL WAITING ROOM VISITATION Patients having surgery or a procedure may have no more than 2 support people in the waiting area - these visitors may rotate.   Children under the age of 37 must have an adult with them who is not the patient. If the patient needs to stay at the hospital during part of their recovery, the visitor guidelines for inpatient rooms apply. Pre-op nurse will coordinate an appropriate time for 1 support person to accompany patient in pre-op.  This support person may not rotate.   Special instructions:   [place]- Preparing For Surgery  Before surgery, you can play an important role. Because skin is not sterile, your skin needs to be as free of germs as possible. You can reduce the number of germs on your skin by washing with CHG (chlorahexidine gluconate) Soap before surgery.  CHG is an antiseptic cleaner which kills germs and bonds with the skin to continue killing germs even after washing.    Oral Hygiene is also important to reduce your risk of infection.  Remember - BRUSH YOUR TEETH THE MORNING OF SURGERY WITH YOUR REGULAR TOOTHPASTE  Please do not use if you have an allergy to CHG or antibacterial soaps. If your skin becomes reddened/irritated stop using the CHG.  Do not shave (including legs and underarms) for at least 48 hours prior to first CHG shower. It is OK to shave your face.  Please follow these instructions carefully.   Shower the NIGHT BEFORE SURGERY and the MORNING OF SURGERY  If you chose to wash your hair, wash your hair first as usual  with your normal shampoo.  After you shampoo, rinse your hair and body thoroughly to remove the shampoo.  Use CHG Soap as you would any other liquid soap. You can apply CHG directly to the skin and wash gently with a scrungie or a clean washcloth.   Apply the CHG Soap to your body ONLY FROM THE NECK DOWN.  Do not use on open wounds or open sores. Avoid contact with your eyes, ears, mouth and genitals (private parts). Wash Face and genitals (private parts)  with your normal soap.   Wash thoroughly, paying special attention to the area where your surgery will be performed.  Thoroughly rinse your body with warm water from the neck down.  DO NOT shower/wash with your normal soap after using and rinsing off the CHG Soap.  Pat yourself dry with a CLEAN TOWEL.  Wear CLEAN  PAJAMAS to bed the night before surgery  Place CLEAN SHEETS on your bed the night before your surgery  DO NOT SLEEP WITH PETS.  Day of Surgery: Take a shower with CHG soap. Do not wear jewelry or makeup Do not wear lotions, powders, perfumes, or deodorant. Do not shave 48 hours prior to surgery.   Do not bring valuables to the hospital. [facility] is not responsible for any belongings or valuables. Do not wear nail polish, gel polish, artificial nails, or any other type of covering on natural nails (fingers and toes) If you have artificial nails or gel coating that need to be removed by a nail salon, please have this removed prior to surgery. Artificial nails or gel coating may interfere with anesthesia's ability to adequately monitor your vital signs. Wear Clean/Comfortable clothing the morning of surgery Remember to brush your teeth WITH YOUR REGULAR TOOTHPASTE.   Please read over the fact sheets that you were given.

## 2022-12-14 NOTE — Pre-Procedure Instructions (Signed)
Surgical Instructions    Your procedure is scheduled on Tuesday, May 28th.  Report to [facility] Main Entrance "A" at 05:30 A.M., then check in with the Admitting office.  Call this number if you have problems the morning of surgery:  [phone]  If you have any questions prior to your surgery date call [phone]: Open Monday-Friday 8am-4pm If you experience any cold or flu symptoms such as cough, fever, chills, shortness of breath, etc. between now and your scheduled surgery, please notify us at the above number.     Remember:  Do not eat after midnight the night before your surgery  You may drink clear liquids until 04:30 AM the morning of your surgery.   Clear liquids allowed are: Water, Non-Citrus Juices (without pulp), Carbonated Beverages, Clear Tea, Black Coffee Only (NO MILK, CREAM OR POWDERED CREAMER of any kind), and Gatorade.   Patient Instructions  The night before surgery:  No food after midnight. ONLY clear liquids after midnight   The day of surgery (if you have diabetes): Drink ONE (1) 12 oz G2 given to you in your pre admission testing appointment by 04:30 AM the morning of surgery. Drink in one sitting. Do not sip.  This drink was given to you during your hospital  pre-op appointment visit.  Nothing else to drink after completing the  12 oz bottle of G2.         If you have questions, please contact your surgeon's office.     Take these medicines the morning of surgery with A SIP OF WATER  amLODipine (NORVASC)  atorvastatin (LIPITOR)  FLUoxetine (PROZAC)  gabapentin (NEURONTIN)  hydrALAZINE (APRESOLINE)  levothyroxine (SYNTHROID)  nebivolol (BYSTOLIC)  omeprazole (PRILOSEC)   Follow your surgeon's instructions on when to stop Aspirin.  If no instructions were given by your surgeon then you will need to call the office to get those instructions.     As of today, STOP taking any Aleve, Naproxen, Ibuprofen, celecoxib (CELEBREX), Motrin, Advil,  Goody's, BC's, all herbal medications, fish oil, diclofenac Sodium (VOLTAREN) gel and all vitamins.  WHAT DO I DO ABOUT MY DIABETES MEDICATION?   Do not take metFORMIN (GLUCOPHAGE) the morning of surgery.     HOW TO MANAGE YOUR DIABETES BEFORE AND AFTER SURGERY  Why is it important to control my blood sugar before and after surgery? Improving blood sugar levels before and after surgery helps healing and can limit problems. A way of improving blood sugar control is eating a healthy diet by:  Eating less sugar and carbohydrates  Increasing activity/exercise  Talking with your doctor about reaching your blood sugar goals High blood sugars (greater than 180 mg/dL) can raise your risk of infections and slow your recovery, so you will need to focus on controlling your diabetes during the weeks before surgery. Make sure that the doctor who takes care of your diabetes knows about your planned surgery including the date and location.  How do I manage my blood sugar before surgery? Check your blood sugar at least 4 times a day, starting 2 days before surgery, to make sure that the level is not too high or low.  Check your blood sugar the morning of your surgery when you wake up and every 2 hours until you get to the Short Stay unit.  If your blood sugar is less than 70 mg/dL, you will need to treat for low blood sugar: Do not take insulin. Treat a low blood sugar (less than 70 mg/dL) with  cup of clear juice (cranberry or apple), 4 glucose tablets, OR glucose gel. Recheck blood sugar in 15 minutes after treatment (to make sure it is greater than 70 mg/dL). If your blood sugar is not greater than 70 mg/dL on recheck, call [phone] for further instructions. Report your blood sugar to the short stay nurse when you get to Short Stay.  If you are admitted to the hospital after surgery: Your blood sugar will be checked by the staff and you will probably be given insulin after surgery (instead  of oral diabetes medicines) to make sure you have good blood sugar levels. The goal for blood sugar control after surgery is 80-180 mg/dL.                     Do NOT Smoke (Tobacco/Vaping) for 24 hours prior to your procedure.  If you use a CPAP at night, you may bring your mask/headgear for your overnight stay.   Contacts, glasses, piercing's, hearing aid's, dentures or partials may not be worn into surgery, please bring cases for these belongings.    For patients admitted to the hospital, discharge time will be determined by your treatment team.   Patients discharged the day of surgery will not be allowed to drive home, and someone needs to stay with them for 24 hours.  SURGICAL WAITING ROOM VISITATION Patients having surgery or a procedure may have no more than 2 support people in the waiting area - these visitors may rotate.   Children under the age of 68 must have an adult with them who is not the patient. If the patient needs to stay at the hospital during part of their recovery, the visitor guidelines for inpatient rooms apply. Pre-op nurse will coordinate an appropriate time for 1 support person to accompany patient in pre-op.  This support person may not rotate.   Please refer to the [facility] website for the visitor guidelines for Inpatients (after your surgery is over and you are in a regular room).    Special instructions:   [facility]- Preparing For Surgery  Before surgery, you can play an important role. Because skin is not sterile, your skin needs to be as free of germs as possible. You can reduce the number of germs on your skin by washing with CHG (chlorahexidine gluconate) Soap before surgery.  CHG is an antiseptic cleaner which kills germs and bonds with the skin to continue killing germs even after washing.    Oral Hygiene is also important to reduce your risk of infection.  Remember - BRUSH YOUR TEETH THE MORNING OF SURGERY WITH YOUR REGULAR TOOTHPASTE  Please do  not use if you have an allergy to CHG or antibacterial soaps. If your skin becomes reddened/irritated stop using the CHG.  Do not shave (including legs and underarms) for at least 48 hours prior to first CHG shower. It is OK to shave your face.  Please follow these instructions carefully.   Shower the NIGHT BEFORE SURGERY and the MORNING OF SURGERY  If you chose to wash your hair, wash your hair first as usual with your normal shampoo.  After you shampoo, rinse your hair and body thoroughly to remove the shampoo.  Use CHG Soap as you would any other liquid soap. You can apply CHG directly to the skin and wash gently with a scrungie or a clean washcloth.   Apply the CHG Soap to your body ONLY FROM THE NECK DOWN.  Do not use on open  wounds or open sores. Avoid contact with your eyes, ears, mouth and genitals (private parts). Wash Face and genitals (private parts)  with your normal soap.   Wash thoroughly, paying special attention to the area where your surgery will be performed.  Thoroughly rinse your body with warm water from the neck down.  DO NOT shower/wash with your normal soap after using and rinsing off the CHG Soap.  Pat yourself dry with a CLEAN TOWEL.  Wear CLEAN PAJAMAS to bed the night before surgery  Place CLEAN SHEETS on your bed the night before your surgery  DO NOT SLEEP WITH PETS.   Day of Surgery: Take a shower with CHG soap. Do not wear jewelry or makeup Do not wear lotions, powders, perfumes, or deodorant. Do not shave 48 hours prior to surgery.   Do not bring valuables to the hospital. [facility] is not responsible for any belongings or valuables. Do not wear nail polish, gel polish, artificial nails, or any other type of covering on natural nails (fingers and toes) If you have artificial nails or gel coating that need to be removed by a nail salon, please have this removed prior to surgery. Artificial nails or gel coating may interfere with anesthesia's  ability to adequately monitor your vital signs. Wear Clean/Comfortable clothing the morning of surgery Remember to brush your teeth WITH YOUR REGULAR TOOTHPASTE.   Please read over the following fact sheets that you were given.    If you received a COVID test during your pre-op visit  it is requested that you wear a mask when out in public, stay away from anyone that may not be feeling well and notify your surgeon if you develop symptoms. If you have been in contact with anyone that has tested positive in the last 10 days please notify you surgeon.

## 2022-12-17 ENCOUNTER — Encounter (HOSPITAL_COMMUNITY)
Admission: RE | Admit: 2022-12-17 | Discharge: 2022-12-17 | Disposition: A | Discharge status: Home / Self Care | Payer: 59 | Source: Ambulatory Visit | Attending: Surgery | Admitting: Surgery

## 2022-12-17 ENCOUNTER — Encounter (HOSPITAL_COMMUNITY): Payer: Self-pay

## 2022-12-17 ENCOUNTER — Other Ambulatory Visit: Payer: Self-pay

## 2022-12-17 VITALS — BP 154/69 | HR 58 | Temp 98.3°F | Resp 18 | Ht 65.0 in | Wt 304.2 lb

## 2022-12-17 DIAGNOSIS — I498 Other specified cardiac arrhythmias: Secondary | ICD-10-CM | POA: Insufficient documentation

## 2022-12-17 DIAGNOSIS — Z01818 Encounter for other preprocedural examination: Secondary | ICD-10-CM

## 2022-12-17 LAB — BASIC METABOLIC PANEL
Anion gap: 11 (ref 5–15)
BUN: 13 mg/dL (ref 8–23)
CO2: 28 mmol/L (ref 22–32)
Calcium: 8.8 mg/dL — ABNORMAL LOW (ref 8.9–10.3)
Chloride: 99 mmol/L (ref 98–111)
Creatinine, Ser: 1.1 mg/dL — ABNORMAL HIGH (ref 0.44–1.00)
GFR, Estimated: 56 mL/min — ABNORMAL LOW (ref >60)
Glucose, Bld: 108 mg/dL — ABNORMAL HIGH (ref 70–99)
Potassium: 3.4 mmol/L — ABNORMAL LOW (ref 3.5–5.1)
Sodium: 138 mmol/L (ref 135–145)

## 2022-12-17 LAB — GLUCOSE, CAPILLARY: Glucose-Capillary: 120 mg/dL — ABNORMAL HIGH (ref 70–99)

## 2022-12-17 NOTE — Progress Notes (Addendum)
PCP - Dr. Bertram Denver  EKG - today Stress Test - 08/10/15 ECHO - 03/1916  Sleep Study - No  She scored 6 on the Obstructive Sleep Apnea questions. Sent to PCP  Fasting Blood Sugar - 107-117 Checks Blood Sugar __1___ times a day  Aspirin Instructions: States Dr. Corliss Skains instructed her to hold 2 days prior to surgeyr  ERAS Protcol -yes G2- yes  COVID TEST- N/A   Anesthesia review: No  Patient denies shortness of breath, fever, cough and chest pain at PAT appointment   All instructions explained to the patient, with a verbal understanding of the material. Patient agrees to go over the instructions while at home for a better understanding. Patient also instructed to self quarantine after being tested for COVID-19. The opportunity to ask questions was provided.

## 2022-12-17 NOTE — Progress Notes (Signed)
   12/17/22 1007  OBSTRUCTIVE SLEEP APNEA  Have you ever been diagnosed with sleep apnea through a sleep study? No  Do you snore loudly (loud enough to be heard through closed doors)?  1  Do you often feel tired, fatigued, or sleepy during the daytime (such as falling asleep during driving or talking to someone)? 1  Has anyone observed you stop breathing during your sleep? 0  Do you have, or are you being treated for high blood pressure? 1  BMI more than 35 kg/m2? 1  Age > 50 (1-yes) 1  Neck circumference greater than:Female 16 inches or larger, Female 17inches or larger? 1  Female Gender (Yes=1) 0  Obstructive Sleep Apnea Score 6  Score 5 or greater  Results sent to PCP

## 2022-12-19 ENCOUNTER — Other Ambulatory Visit: Payer: Self-pay | Admitting: Nurse Practitioner

## 2022-12-19 DIAGNOSIS — I10 Essential (primary) hypertension: Secondary | ICD-10-CM

## 2022-12-19 DIAGNOSIS — E118 Type 2 diabetes mellitus with unspecified complications: Secondary | ICD-10-CM

## 2022-12-20 ENCOUNTER — Telehealth: Payer: Self-pay | Admitting: *Deleted

## 2022-12-20 NOTE — Telephone Encounter (Signed)
Procedure: Colonoscopy       Estimated body mass index is 50.62 kg/m as calculated from the following:   Height as of 12/17/22: 5\' 5"  (1.651 m).   Weight as of 12/17/22: 304 lb 3.2 oz (138 kg).  Have you had a colonoscopy before?  11/2012, Dr. Darrick Penna  Do you have family history of colon cancer?  no  Do you have a family history of polyps? no  Previous colonoscopy with polyps removed? no  Do you have a history colorectal cancer?   no  Are you diabetic?  Yes, type 2  Do you have a prosthetic or mechanical heart valve? no  Do you have a pacemaker/defibrillator?   no  Have you had endocarditis/atrial fibrillation?  no  Do you use supplemental oxygen/CPAP?  no  Have you had joint replacement within the last 12 months?  no  Do you tend to be constipated or have to use laxatives?  no   Do you have history of alcohol use? If yes, how much and how often.  Yes 1-2 drinks a week  Do you have history or are you using drugs? If yes, what do are you  using?  no  Have you ever had a stroke/heart attack?  no  Have you ever had a heart or other vascular stent placed,?no  Do you take weight loss medication? no  female patients,: have you had a hysterectomy? no                              are you post menopausal?  no                              do you still have your menstrual cycle? no    Date of last menstrual period?   Do you take any blood-thinning medications such as: (Plavix, aspirin, Coumadin, Aggrenox, Brilinta, Xarelto, Eliquis, Pradaxa, Savaysa or Effient)? yes  If yes we need the name, milligram, dosage and who is prescribing doctor:  aspirin 81mg  daily             Current Outpatient Medications  Medication Sig Dispense Refill   amitriptyline (ELAVIL) 10 MG tablet TAKE 1 TABLET BY MOUTH AT  BEDTIME 100 tablet 0   amLODipine (NORVASC) 10 MG tablet TAKE 1 TABLET BY MOUTH DAILY 100 tablet 0   aspirin 81 MG tablet Take 81 mg by mouth every other day.     atorvastatin  (LIPITOR) 40 MG tablet Take 1 tablet (40 mg total) by mouth daily. 90 tablet 3   Blood Glucose Monitoring Suppl (TRUE METRIX AIR GLUCOSE METER) w/Device KIT USE AS DIRECTED 1 kit 0   celecoxib (CELEBREX) 100 MG capsule TAKE 1 CAPSULE(100 MG) BY MOUTH TWICE DAILY AS NEEDED FOR MODERATE PAIN 60 capsule 2   chlorthalidone (HYGROTON) 25 MG tablet TAKE 1 TABLET BY MOUTH DAILY 90 tablet 0   ciclopirox (PENLAC) 8 % solution Apply one coat to toenail qd for 48 weeks.  Remove weekly with polish remover. (Patient taking differently: Apply 1 Application topically 3 (three) times a week.   Remove weekly with polish remover.) 6.6 mL 11   diclofenac Sodium (VOLTAREN) 1 % GEL Apply 1 Application topically 2 (two) times daily.     FLUoxetine (PROZAC) 40 MG capsule Take 40 mg by mouth daily.     gabapentin (NEURONTIN) 300 MG capsule Take 300 mg by  mouth 3 (three) times daily.     hydrALAZINE (APRESOLINE) 10 MG tablet Take 1 tablet (10 mg total) by mouth 3 (three) times daily. 270 tablet 3   levothyroxine (SYNTHROID) 175 MCG tablet TAKE 1 TABLET BY MOUTH DAILY  BEFORE BREAKFAST 100 tablet 2   metFORMIN (GLUCOPHAGE) 1000 MG tablet TAKE 1 TABLET BY MOUTH TWICE  DAILY WITH MEALS 180 tablet 0   nebivolol (BYSTOLIC) 10 MG tablet TAKE 1 TABLET BY MOUTH DAILY 90 tablet 0   omeprazole (PRILOSEC) 40 MG capsule Take 1 capsule (40 mg total) by mouth daily. 180 capsule 3   TRUE METRIX BLOOD GLUCOSE TEST test strip CHECK FINGER STICK BLOOD SUGAR TWICE DAILY AS DIRECTED 200 strip 3   zolpidem (AMBIEN) 5 MG tablet TAKE 1 TABLET EVERY NIGHT AT BEDTIME AS NEEDED FOR SLEEP 30 tablet 3   No current facility-administered medications for this visit.    Allergies  Allergen Reactions   Lisinopril Anaphylaxis    angioedema

## 2022-12-21 ENCOUNTER — Other Ambulatory Visit: Payer: Self-pay | Admitting: Surgery

## 2022-12-21 ENCOUNTER — Ambulatory Visit
Admission: RE | Admit: 2022-12-21 | Discharge: 2022-12-21 | Disposition: A | Discharge status: Home / Self Care | Payer: 59 | Source: Ambulatory Visit | Attending: Surgery | Admitting: Surgery

## 2022-12-21 DIAGNOSIS — D242 Benign neoplasm of left breast: Secondary | ICD-10-CM

## 2022-12-21 DIAGNOSIS — Z510 Encounter for antineoplastic radiation therapy: Secondary | ICD-10-CM | POA: Diagnosis not present

## 2022-12-21 HISTORY — PX: BREAST BIOPSY: SHX20

## 2022-12-24 NOTE — Anesthesia Preprocedure Evaluation (Signed)
Anesthesia Evaluation  Patient identified by MRN, date of birth, ID band Patient awake    Reviewed: Allergy & Precautions, H&P , NPO status , Patient's Chart, lab work & pertinent test results  History of Anesthesia Complications (+) PONV and history of anesthetic complications  Airway Mallampati: I  TM Distance: >3 FB Neck ROM: Full    Dental  (+) Poor Dentition, Missing, Dental Advisory Given   Pulmonary neg pulmonary ROS   breath sounds clear to auscultation       Cardiovascular hypertension, Pt. on medications and Pt. on home beta blockers (-) angina +CHF  negative cardio ROS  Rhythm:Regular Rate:Normal  '17 ECHO: EF 60-65%, valves OK   Neuro/Psych  PSYCHIATRIC DISORDERS Anxiety Depression    Chronic back pain  Neuromuscular disease negative neurological ROS  negative psych ROS   GI/Hepatic negative GI ROS, Neg liver ROS,GERD  Medicated and Controlled,,  Endo/Other  negative endocrine ROSdiabetes, Type 2, Oral Hypoglycemic AgentsHypothyroidism  Morbid obesity  Renal/GU negative Renal ROS  negative genitourinary   Musculoskeletal negative musculoskeletal ROS (+) Arthritis , Osteoarthritis,    Abdominal  (+) + obese  Peds negative pediatric ROS (+)  Hematology negative hematology ROS (+) Blood dyscrasia, anemia   Anesthesia Other Findings   Reproductive/Obstetrics negative OB ROS                             Anesthesia Physical Anesthesia Plan  ASA: 3  Anesthesia Plan: General   Post-op Pain Management: Tylenol PO (pre-op)* and Celebrex PO (pre-op)*   Induction: Intravenous  PONV Risk Score and Plan: 4 or greater and Scopolamine patch - Pre-op, Dexamethasone, Ondansetron and TIVA  Airway Management Planned: LMA  Additional Equipment: None  Intra-op Plan:   Post-operative Plan: Extubation in OR  Informed Consent: I have reviewed the patients History and Physical, chart,  labs and discussed the procedure including the risks, benefits and alternatives for the proposed anesthesia with the patient or authorized representative who has indicated his/her understanding and acceptance.     Dental advisory given  Plan Discussed with: CRNA and Anesthesiologist  Anesthesia Plan Comments:         Anesthesia Quick Evaluation

## 2022-12-25 ENCOUNTER — Ambulatory Visit
Admission: RE | Admit: 2022-12-25 | Discharge: 2022-12-25 | Disposition: A | Discharge status: Home / Self Care | Payer: 59 | Source: Ambulatory Visit | Attending: Surgery | Admitting: Surgery

## 2022-12-25 ENCOUNTER — Other Ambulatory Visit: Payer: Self-pay

## 2022-12-25 ENCOUNTER — Ambulatory Visit (HOSPITAL_COMMUNITY): Payer: 59 | Admitting: Anesthesiology

## 2022-12-25 ENCOUNTER — Encounter (HOSPITAL_COMMUNITY)
Admission: RE | Disposition: A | Discharge status: Home / Self Care | Payer: Self-pay | Source: Ambulatory Visit | Attending: Surgery

## 2022-12-25 ENCOUNTER — Encounter (HOSPITAL_COMMUNITY): Payer: Self-pay | Admitting: Surgery

## 2022-12-25 ENCOUNTER — Ambulatory Visit (HOSPITAL_COMMUNITY)
Admission: RE | Admit: 2022-12-25 | Discharge: 2022-12-25 | Disposition: A | Discharge status: Home / Self Care | Payer: 59 | Source: Ambulatory Visit | Attending: Surgery | Admitting: Surgery

## 2022-12-25 ENCOUNTER — Ambulatory Visit (HOSPITAL_BASED_OUTPATIENT_CLINIC_OR_DEPARTMENT_OTHER): Payer: 59 | Admitting: Anesthesiology

## 2022-12-25 DIAGNOSIS — D630 Anemia in neoplastic disease: Secondary | ICD-10-CM | POA: Diagnosis not present

## 2022-12-25 DIAGNOSIS — I110 Hypertensive heart disease with heart failure: Secondary | ICD-10-CM | POA: Diagnosis not present

## 2022-12-25 DIAGNOSIS — K219 Gastro-esophageal reflux disease without esophagitis: Secondary | ICD-10-CM | POA: Diagnosis not present

## 2022-12-25 DIAGNOSIS — Z6841 Body Mass Index (BMI) 40.0 and over, adult: Secondary | ICD-10-CM | POA: Diagnosis not present

## 2022-12-25 DIAGNOSIS — D638 Anemia in other chronic diseases classified elsewhere: Secondary | ICD-10-CM | POA: Diagnosis not present

## 2022-12-25 DIAGNOSIS — F32A Depression, unspecified: Secondary | ICD-10-CM | POA: Diagnosis not present

## 2022-12-25 DIAGNOSIS — F419 Anxiety disorder, unspecified: Secondary | ICD-10-CM | POA: Insufficient documentation

## 2022-12-25 DIAGNOSIS — N61 Mastitis without abscess: Secondary | ICD-10-CM | POA: Diagnosis not present

## 2022-12-25 DIAGNOSIS — Z9889 Other specified postprocedural states: Secondary | ICD-10-CM | POA: Diagnosis not present

## 2022-12-25 DIAGNOSIS — I509 Heart failure, unspecified: Secondary | ICD-10-CM | POA: Insufficient documentation

## 2022-12-25 DIAGNOSIS — D242 Benign neoplasm of left breast: Secondary | ICD-10-CM | POA: Diagnosis not present

## 2022-12-25 DIAGNOSIS — E039 Hypothyroidism, unspecified: Secondary | ICD-10-CM | POA: Insufficient documentation

## 2022-12-25 DIAGNOSIS — E1140 Type 2 diabetes mellitus with diabetic neuropathy, unspecified: Secondary | ICD-10-CM | POA: Diagnosis not present

## 2022-12-25 DIAGNOSIS — N6042 Mammary duct ectasia of left breast: Secondary | ICD-10-CM | POA: Insufficient documentation

## 2022-12-25 DIAGNOSIS — Z7984 Long term (current) use of oral hypoglycemic drugs: Secondary | ICD-10-CM | POA: Diagnosis not present

## 2022-12-25 DIAGNOSIS — E119 Type 2 diabetes mellitus without complications: Secondary | ICD-10-CM | POA: Diagnosis not present

## 2022-12-25 DIAGNOSIS — F418 Other specified anxiety disorders: Secondary | ICD-10-CM

## 2022-12-25 HISTORY — PX: BREAST EXCISIONAL BIOPSY: SUR124

## 2022-12-25 HISTORY — PX: BREAST LUMPECTOMY WITH RADIOACTIVE SEED LOCALIZATION: SHX6424

## 2022-12-25 LAB — GLUCOSE, CAPILLARY
Glucose-Capillary: 123 mg/dL — ABNORMAL HIGH (ref 70–99)
Glucose-Capillary: 112 mg/dL — ABNORMAL HIGH (ref 70–99)

## 2022-12-25 SURGERY — BREAST LUMPECTOMY WITH RADIOACTIVE SEED LOCALIZATION
Anesthesia: General | Site: Breast | Laterality: Left

## 2022-12-25 MED ORDER — CEFAZOLIN IN SODIUM CHLORIDE 3-0.9 GM/100ML-% IV SOLN
3.0000 g | INTRAVENOUS | Status: AC
  Administered 2022-12-25: 3 g via INTRAVENOUS
  Filled 2022-12-25: qty 100

## 2022-12-25 MED ORDER — CEFAZOLIN IN SODIUM CHLORIDE 3-0.9 GM/100ML-% IV SOLN
INTRAVENOUS | Status: AC
  Filled 2022-12-25: qty 100

## 2022-12-25 MED ORDER — LIDOCAINE 2% (20 MG/ML) 5 ML SYRINGE
INTRAMUSCULAR | Status: DC | PRN
  Administered 2022-12-25: 100 mg via INTRAVENOUS

## 2022-12-25 MED ORDER — CHLORHEXIDINE GLUCONATE CLOTH 2 % EX PADS: 6.0000 | MEDICATED_PAD | Freq: Once | CUTANEOUS | Status: DC

## 2022-12-25 MED ORDER — ACETAMINOPHEN 325 MG PO TABS: 325.0000 mg | ORAL_TABLET | ORAL | Status: DC | PRN

## 2022-12-25 MED ORDER — 0.9 % SODIUM CHLORIDE (POUR BTL) OPTIME
TOPICAL | Status: DC | PRN
  Administered 2022-12-25: 1000 mL

## 2022-12-25 MED ORDER — PROPOFOL 10 MG/ML IV BOLUS
INTRAVENOUS | Status: DC | PRN
  Administered 2022-12-25: 150 mg via INTRAVENOUS

## 2022-12-25 MED ORDER — ONDANSETRON HCL 4 MG/2ML IJ SOLN
INTRAMUSCULAR | Status: AC
  Filled 2022-12-25: qty 2

## 2022-12-25 MED ORDER — ONDANSETRON HCL 4 MG/2ML IJ SOLN
INTRAMUSCULAR | Status: DC | PRN
  Administered 2022-12-25: 4 mg via INTRAVENOUS

## 2022-12-25 MED ORDER — OXYCODONE HCL 5 MG PO TABS: 5.0000 mg | ORAL_TABLET | Freq: Once | ORAL | Status: DC | PRN

## 2022-12-25 MED ORDER — PROPOFOL 500 MG/50ML IV EMUL
INTRAVENOUS | Status: DC | PRN
  Administered 2022-12-25: 150 ug/kg/min via INTRAVENOUS

## 2022-12-25 MED ORDER — MEPERIDINE HCL 25 MG/ML IJ SOLN: 6.2500 mg | INTRAMUSCULAR | Status: DC | PRN

## 2022-12-25 MED ORDER — BUPIVACAINE-EPINEPHRINE 0.25% -1:200000 IJ SOLN
INTRAMUSCULAR | Status: DC | PRN
  Administered 2022-12-25: 8 mL

## 2022-12-25 MED ORDER — DEXMEDETOMIDINE HCL IN NACL 80 MCG/20ML IV SOLN
INTRAVENOUS | Status: AC
  Filled 2022-12-25: qty 20

## 2022-12-25 MED ORDER — DEXAMETHASONE SODIUM PHOSPHATE 10 MG/ML IJ SOLN
INTRAMUSCULAR | Status: DC | PRN
  Administered 2022-12-25: 10 mg via INTRAVENOUS

## 2022-12-25 MED ORDER — FENTANYL CITRATE (PF) 100 MCG/2ML IJ SOLN: 25.0000 ug | INTRAMUSCULAR | Status: DC | PRN

## 2022-12-25 MED ORDER — PROPOFOL 1000 MG/100ML IV EMUL
INTRAVENOUS | Status: AC
  Filled 2022-12-25: qty 100

## 2022-12-25 MED ORDER — OXYCODONE HCL 5 MG/5ML PO SOLN: 5.0000 mg | Freq: Once | ORAL | Status: DC | PRN

## 2022-12-25 MED ORDER — ACETAMINOPHEN 500 MG PO TABS: 1000.0000 mg | ORAL_TABLET | Freq: Once | ORAL | Status: DC

## 2022-12-25 MED ORDER — ACETAMINOPHEN 160 MG/5ML PO SOLN: 325.0000 mg | ORAL | Status: DC | PRN

## 2022-12-25 MED ORDER — MIDAZOLAM HCL 5 MG/5ML IJ SOLN
INTRAMUSCULAR | Status: DC | PRN
  Administered 2022-12-25: 2 mg via INTRAVENOUS

## 2022-12-25 MED ORDER — ACETAMINOPHEN 500 MG PO TABS
1000.0000 mg | ORAL_TABLET | ORAL | Status: AC
  Administered 2022-12-25: 1000 mg via ORAL
  Filled 2022-12-25: qty 2

## 2022-12-25 MED ORDER — CHLORHEXIDINE GLUCONATE 0.12 % MT SOLN
15.0000 mL | Freq: Once | OROMUCOSAL | Status: AC
  Administered 2022-12-25: 15 mL via OROMUCOSAL
  Filled 2022-12-25: qty 15

## 2022-12-25 MED ORDER — DEXMEDETOMIDINE HCL IN NACL 80 MCG/20ML IV SOLN
INTRAVENOUS | Status: DC | PRN
  Administered 2022-12-25 (×3): 4 ug via INTRAVENOUS

## 2022-12-25 MED ORDER — ORAL CARE MOUTH RINSE: 15.0000 mL | Freq: Once | OROMUCOSAL | Status: AC

## 2022-12-25 MED ORDER — MIDAZOLAM HCL 2 MG/2ML IJ SOLN
INTRAMUSCULAR | Status: AC
  Filled 2022-12-25: qty 2

## 2022-12-25 MED ORDER — LACTATED RINGERS IV SOLN: INTRAVENOUS | Status: DC

## 2022-12-25 MED ORDER — PROPOFOL 10 MG/ML IV BOLUS
INTRAVENOUS | Status: AC
  Filled 2022-12-25: qty 20

## 2022-12-25 MED ORDER — DEXAMETHASONE SODIUM PHOSPHATE 10 MG/ML IJ SOLN
INTRAMUSCULAR | Status: AC
  Filled 2022-12-25: qty 1

## 2022-12-25 MED ORDER — BUPIVACAINE-EPINEPHRINE (PF) 0.25% -1:200000 IJ SOLN
INTRAMUSCULAR | Status: AC
  Filled 2022-12-25: qty 30

## 2022-12-25 MED ORDER — SUGAMMADEX SODIUM 200 MG/2ML IV SOLN
INTRAVENOUS | Status: DC | PRN
  Administered 2022-12-25: 275 mg via INTRAVENOUS

## 2022-12-25 MED ORDER — ONDANSETRON HCL 4 MG/2ML IJ SOLN: 4.0000 mg | Freq: Once | INTRAMUSCULAR | Status: DC | PRN

## 2022-12-25 MED ORDER — ROCURONIUM BROMIDE 100 MG/10ML IV SOLN
INTRAVENOUS | Status: DC | PRN
  Administered 2022-12-25: 50 mg via INTRAVENOUS

## 2022-12-25 MED ORDER — FENTANYL CITRATE (PF) 100 MCG/2ML IJ SOLN
INTRAMUSCULAR | Status: DC | PRN
  Administered 2022-12-25: 50 ug via INTRAVENOUS
  Administered 2022-12-25 (×2): 25 ug via INTRAVENOUS
  Administered 2022-12-25: 50 ug via INTRAVENOUS

## 2022-12-25 MED ORDER — LIDOCAINE 2% (20 MG/ML) 5 ML SYRINGE
INTRAMUSCULAR | Status: AC
  Filled 2022-12-25: qty 5

## 2022-12-25 MED ORDER — FENTANYL CITRATE (PF) 250 MCG/5ML IJ SOLN
INTRAMUSCULAR | Status: AC
  Filled 2022-12-25: qty 5

## 2022-12-25 MED ORDER — CELECOXIB 200 MG PO CAPS
200.0000 mg | ORAL_CAPSULE | Freq: Once | ORAL | Status: AC
  Administered 2022-12-25: 200 mg via ORAL
  Filled 2022-12-25: qty 1

## 2022-12-25 MED ORDER — ROCURONIUM BROMIDE 10 MG/ML (PF) SYRINGE
PREFILLED_SYRINGE | INTRAVENOUS | Status: AC
  Filled 2022-12-25: qty 10

## 2022-12-25 SURGICAL SUPPLY — 42 items
APL PRP STRL LF DISP 70% ISPRP (MISCELLANEOUS) ×1
APL SKNCLS STERI-STRIP NONHPOA (GAUZE/BANDAGES/DRESSINGS) ×1
APPLIER CLIP 9.375 MED OPEN (MISCELLANEOUS)
APR CLP MED 9.3 20 MLT OPN (MISCELLANEOUS)
BAG COUNTER SPONGE SURGICOUNT (BAG) ×2 IMPLANT
BAG SPNG CNTER NS LX DISP (BAG) ×1
BENZOIN TINCTURE PRP APPL 2/3 (GAUZE/BANDAGES/DRESSINGS) ×2 IMPLANT
BINDER BREAST LRG (GAUZE/BANDAGES/DRESSINGS) IMPLANT
BINDER BREAST XLRG (GAUZE/BANDAGES/DRESSINGS) IMPLANT
CANISTER SUCT 3000ML PPV (MISCELLANEOUS) ×2 IMPLANT
CHLORAPREP W/TINT 26 (MISCELLANEOUS) ×2 IMPLANT
CLIP APPLIE 9.375 MED OPEN (MISCELLANEOUS) IMPLANT
COVER PROBE W GEL 5X96 (DRAPES) ×2 IMPLANT
COVER SURGICAL LIGHT HANDLE (MISCELLANEOUS) ×2 IMPLANT
DEVICE DUBIN SPECIMEN MAMMOGRA (MISCELLANEOUS) ×2 IMPLANT
DRAPE CHEST BREAST 15X10 FENES (DRAPES) ×2 IMPLANT
DRSG TEGADERM 4X4.75 (GAUZE/BANDAGES/DRESSINGS) ×2 IMPLANT
ELECT CAUTERY BLADE 6.4 (BLADE) ×2 IMPLANT
ELECT REM PT RETURN 9FT ADLT (ELECTROSURGICAL) ×1
ELECTRODE REM PT RTRN 9FT ADLT (ELECTROSURGICAL) ×2 IMPLANT
GAUZE SPONGE 2X2 8PLY STRL LF (GAUZE/BANDAGES/DRESSINGS) ×2 IMPLANT
GLOVE BIO SURGEON STRL SZ7 (GLOVE) ×2 IMPLANT
GLOVE BIOGEL PI IND STRL 7.5 (GLOVE) ×2 IMPLANT
GOWN STRL REUS W/ TWL LRG LVL3 (GOWN DISPOSABLE) ×4 IMPLANT
GOWN STRL REUS W/TWL LRG LVL3 (GOWN DISPOSABLE) ×2
ILLUMINATOR WAVEGUIDE N/F (MISCELLANEOUS) IMPLANT
KIT BASIN OR (CUSTOM PROCEDURE TRAY) ×2 IMPLANT
KIT MARKER MARGIN INK (KITS) ×2 IMPLANT
LIGHT WAVEGUIDE WIDE FLAT (MISCELLANEOUS) IMPLANT
NDL HYPO 25GX1X1/2 BEV (NEEDLE) ×2 IMPLANT
NEEDLE HYPO 25GX1X1/2 BEV (NEEDLE) ×1 IMPLANT
NS IRRIG 1000ML POUR BTL (IV SOLUTION) ×2 IMPLANT
PACK GENERAL/GYN (CUSTOM PROCEDURE TRAY) ×2 IMPLANT
SPONGE T-LAP 4X18 ~~LOC~~+RFID (SPONGE) ×2 IMPLANT
STRIP CLOSURE SKIN 1/2X4 (GAUZE/BANDAGES/DRESSINGS) ×2 IMPLANT
SUT MNCRL AB 4-0 PS2 18 (SUTURE) ×2 IMPLANT
SUT SILK 2 0 SH (SUTURE) IMPLANT
SUT VIC AB 3-0 SH 27 (SUTURE) ×1
SUT VIC AB 3-0 SH 27X BRD (SUTURE) ×2 IMPLANT
SYR CONTROL 10ML LL (SYRINGE) ×2 IMPLANT
TOWEL GREEN STERILE (TOWEL DISPOSABLE) ×2 IMPLANT
TOWEL GREEN STERILE FF (TOWEL DISPOSABLE) ×2 IMPLANT

## 2022-12-25 NOTE — Anesthesia Procedure Notes (Signed)
Procedure Name: Intubation Date/Time: 12/25/2022 7:40 AM  Performed by: Marny Lowenstein, CRNAPre-anesthesia Checklist: Patient identified, Emergency Drugs available, Suction available and Patient being monitored Patient Re-evaluated:Patient Re-evaluated prior to induction Oxygen Delivery Method: Circle system utilized Preoxygenation: Pre-oxygenation with 100% oxygen Induction Type: IV induction Ventilation: Mask ventilation without difficulty Laryngoscope Size: Miller and 2 Grade View: Grade I Tube type: Oral Tube size: 7.0 mm Number of attempts: 1 Airway Equipment and Method: Patient positioned with wedge pillow and Stylet Placement Confirmation: ETT inserted through vocal cords under direct vision, positive ETCO2 and breath sounds checked- equal and bilateral Secured at: 21 cm Tube secured with: Tape Dental Injury: Teeth and Oropharynx as per pre-operative assessment

## 2022-12-25 NOTE — Op Note (Signed)
Pre-op Diagnosis:  Left breast intraductal papilloma Post-op Diagnosis: same Procedure:  Left radioactive seed localized lumpectomy Surgeon:  Antoine Fiallos K. Resident:  Dr. Evern Core I was personally present during the key and critical portions of this procedure and immediately available throughout the entire procedure, as documented in my operative note.  Anesthesia:  GEN - LMA Indications:  This is a 67 year old female with type 2 diabetes who presents after routine screening mammogram recently revealed a possible left breast mass. She underwent further workup including diagnostic mammogram and ultrasound. In the left breast at 3:00, 6 cm from the nipple there is a 8 x 6 x 3 mm mass. This area was biopsied and revealed intraductal papilloma. In the retroareolar region at 6:00, there is a dilated duct that was biopsied and showed only duct ectasia but no sign of malignancy. She presents now to discuss excision of the intraductal papilloma.  Description of procedure: The patient is brought to the operating room placed in supine position on the operating room table. After an adequate level of general anesthesia was obtained, her left breast was prepped with ChloraPrep and draped in sterile fashion. A timeout was taken to ensure the proper patient and proper procedure. We interrogated the breast with the neoprobe. We made a transverse incision in the lateral left breast after infiltrating with 0.25% Marcaine. Dissection was carried down in the breast tissue with cautery. We used the neoprobe to guide [place] towards the radioactive seed. We excised an area of tissue around the radioactive seed 2 cm in diameter. The specimen was removed and was oriented with a paint kit. Specimen mammogram showed the radioactive seed as well as the biopsy clip within the specimen. This was sent for pathologic examination. There is no residual radioactivity within the biopsy cavity. We inspected carefully for hemostasis. The wound  was thoroughly irrigated. The wound was closed with a deep layer of 3-0 Vicryl and a subcuticular layer of 4-0 Monocryl. Benzoin Steri-Strips were applied. The patient was then extubated and brought to the recovery room in stable condition. All sponge, instrument, and needle counts are correct.  Wilmon Arms. Corliss Skains, MD, [facility]  General/ Trauma Surgery  12/25/2022 8:14 AM

## 2022-12-25 NOTE — H&P (Signed)
Subjective    Chief Complaint: New Consultation (Left breast intraductal papilloma)       History of Present Illness: Autumn Carson is a 67 y.o. female who is seen today as an office consultation at the request of Dr. Meredeth Ide for evaluation of New Consultation (Left breast intraductal papilloma) .   This is a 67 year old female with type 2 diabetes who presents after routine screening mammogram recently revealed a possible left breast mass.  She underwent further workup including diagnostic mammogram and ultrasound.  In the left breast at 3:00, 6 cm from the nipple there is a 8 x 6 x 3 mm mass.  This area was biopsied and revealed intraductal papilloma.  In the retroareolar region at 6:00, there is a dilated duct that was biopsied and showed only duct ectasia but no sign of malignancy.  She presents now to discuss excision of the intraductal papilloma.   Review of Systems: A complete review of systems was obtained from the patient.  I have reviewed this information and discussed as appropriate with the patient.  See HPI as well for other ROS.   Review of Systems  Constitutional:  Positive for weight loss.  HENT: Negative.    Eyes: Negative.   Respiratory: Negative.    Cardiovascular: Negative.   Gastrointestinal: Negative.   Genitourinary: Negative.   Musculoskeletal: Negative.   Skin:  Positive for itching.  Neurological:  Positive for weakness.  Endo/Heme/Allergies: Negative.   Psychiatric/Behavioral:  Positive for depression. The patient is nervous/anxious.         Medical History:     Past Medical History:  Diagnosis Date   Anxiety     Arthritis     Diabetes mellitus without complication (CMS/HHS-HCC)     GERD (gastroesophageal reflux disease)     History of cancer     Hyperlipidemia     Hypertension     Thyroid disease           Patient Active Problem List  Diagnosis   Chronic anemia, unspecified   Class 3 obesity (CMS/HHS-HCC)   DDD (degenerative disc  disease), lumbar   Depression with anxiety   Diabetic neuropathy (CMS/HHS-HCC)   Esophageal reflux   Essential hypertension   History of papillary adenocarcinoma of thyroid   Hyperlipidemia   Hypothyroidism   Lumbar foraminal stenosis   OA (osteoarthritis) of knee   Spondylolisthesis   Type 2 diabetes mellitus (CMS/HHS-HCC)           Past Surgical History:  Procedure Laterality Date   TONSILLECTOMY & ADENOIDECTOMY   1987   FOOT SURGERY  Left 1999   THYROIDECTOMY TOTAL   2011   EGD   2014    2017   COLONOSCOPY   2014   BACK SURGERY        BREAST BIOPSY SURGERY  Left      x 2 2014, 2024   CARPAL TUNNEL SURGEY       CESAREAN SECTION       CHOLECYSTECTOMY       ELBOW SURGERY  Bilateral     LAPAROSCOPIC TUBAL LIGATION       SHOULDER SURGERY  Bilateral             Allergies  Allergen Reactions   Lisinopril Anaphylaxis      angioedema   Other Other (See Comments)      Other reaction(s): throat swelling            Current Outpatient Medications on File Prior to  Visit  Medication Sig Dispense Refill   albuterol MDI, PROVENTIL, VENTOLIN, PROAIR, HFA 90 mcg/actuation inhaler Inhale into the lungs       amitriptyline (ELAVIL) 10 MG tablet         amLODIPine (NORVASC) 10 MG tablet         atorvastatin (LIPITOR) 40 MG tablet         chlorthalidone 25 MG tablet         hydrALAZINE (APRESOLINE) 10 MG tablet         levothyroxine (SYNTHROID) 175 MCG tablet TAKE 1 TABLET BY MOUTH EVERY DAY IN THE MORNING ON AN EMPTY STOMACH       metFORMIN (GLUCOPHAGE) 1000 MG tablet         nebivoloL (BYSTOLIC) 10 MG tablet         omeprazole (PRILOSEC) 40 MG DR capsule         pregabalin (LYRICA) 50 MG capsule Take 1 capsule by mouth at bedtime       zolpidem (AMBIEN) 5 MG tablet         FLUoxetine (PROZAC) 40 MG capsule          No current facility-administered medications on file prior to visit.      History reviewed. No pertinent family history.    Social History       Tobacco  Use  Smoking Status Never  Smokeless Tobacco Never      Social History        Socioeconomic History   Marital status: Divorced  Tobacco Use   Smoking status: Never   Smokeless tobacco: Never  Substance and Sexual Activity   Alcohol use: Yes   Drug use: Never    Social Determinants of Health        Financial Resource Strain: Low Risk  (06/01/2022)    Received from [facility]    Overall Financial Resource Strain (CARDIA)     Difficulty of Paying Living Expenses: Not hard at all  Food Insecurity: No Food Insecurity (06/01/2022)    Received from [facility]    Hunger Vital Sign     Worried About Running Out of Food in the Last Year: Never true     Ran Out of Food in the Last Year: Never true  Transportation Needs: No Transportation Needs (06/01/2022)    Received from [facility] - Transportation     Lack of Transportation (Medical): No     Lack of Transportation (Non-Medical): No  Physical Activity: Insufficiently Active (06/01/2022)    Received from [facility]    Exercise Vital Sign     Days of Exercise per Week: 3 days     Minutes of Exercise per Session: 20 min  Stress: Stress Concern Present (06/01/2022)    Received from [facility] - Occupational Stress Questionnaire     Feeling of Stress : To some extent  Social Connections: Moderately Isolated (06/01/2022)    Received from [facility]    Social Connection and Isolation Panel [NHANES]     Frequency of Communication with Friends and Family: More than three times a week     Frequency of Social Gatherings with Friends and Family: More than three times a week     Attends Religious Services: More than 4 times per year     Active Member of [employer] or Organizations: No     Attends Banker Meetings: Never  Marital Status: Separated      Objective:         Vitals:    11/13/22 1517  PainSc: 0-No pain  PainLoc: Breast        Physical Exam     Constitutional:  WDWN in NAD, conversant, no obvious deformities; lying in bed comfortably Eyes:  Pupils equal, round; sclera anicteric; moist conjunctiva; no lid lag HENT:  Oral mucosa moist; good dentition  Neck:  No masses palpated, trachea midline; no thyromegaly Lungs:  CTA bilaterally; normal respiratory effort Breasts:  symmetric, no nipple changes; no palpable masses or lymphadenopathy on right side Left breast shows some chronic central nipple retraction.  Palpable mass located at 3:00 about 6 cm from the nipple just underneath the skin.  This mass measures approximately 1 cm in diameter to palpation.  No overlying skin changes.  No axillary lymphadenopathy on the left.  No other palpable masses on the left. CV:  Regular rate and rhythm; no murmurs; extremities well-perfused with no edema Abd:  +bowel sounds, soft, non-tender, no palpable organomegaly; no palpable hernias Musc:  Unable to assess gait; no apparent clubbing or cyanosis in extremities Lymphatic:  No palpable cervical or axillary lymphadenopathy Skin:  Warm, dry; no sign of jaundice Psychiatric - alert and oriented x 4; calm mood and affect     Labs, Imaging and Diagnostic Testing: Diagnosis 1. Breast, left, needle core biopsy, 3 o'clock, ribbon - INTRADUCTAL PAPILLOMA, SEE NOTE 2. Breast, left, needle core biopsy, ducts, 6 o'clock, coil - BENIGN DUCT ECTASIA, SEE NOTE - NEGATIVE FOR CARCINOMA Diagnosis Note 1. and 2. [facility] was notified on 10/24/2022. Holley Bouche MD Pathologist, Electronic Signature (Case signed 10/24/2022)   CLINICAL DATA:  Screening recall for a possible left breast asymmetry.   EXAM: DIGITAL DIAGNOSTIC UNILATERAL LEFT MAMMOGRAM WITH TOMOSYNTHESIS; ULTRASOUND LEFT BREAST LIMITED   TECHNIQUE: Left digital diagnostic mammography and breast tomosynthesis was performed.; Targeted ultrasound examination of the left breast was performed.   COMPARISON:   Previous exam(s).   ACR Breast Density Category a: The breasts are almost entirely fatty.   FINDINGS: On spot compression imaging, the possible asymmetry noted on the current screening exam in the anterior, lateral left breast persists as an oval, partly circumscribed, 9 mm mass. There is also a mildly dilated inferior retroareolar left breast duct that appears new since the prior mammograms.   On physical exam, left nipple is partly inverted. Patient does not know the chronicity of this.   Targeted ultrasound is performed, showing an oval, circumscribed, mildly hypoechoic mass in the left breast at 3 o'clock, 6 cm the nipple, relatively superficial, measuring 8 x 3 x 6 mm, consistent in size, shape and location to the mammographic mass. In the 6 o'clock, retroareolar left breast, there is a dilated duct that contains internal echoes, but no internal blood flow. The internal echoes are favored to be debris. Duct measures up to 3 mm in thickness.   Sonographic assessment of the left axilla shows few prominent lymph nodes cortical thickness is up to 3.7 mm. These are most likely reactive.   IMPRESSION: 1. Indeterminate 8 mm mass in the left breast at 3 o'clock, 6 cm the nipple. Although the mass has benign imaging characteristics, it appears new from prior mammograms. 2. Dilated infra-areolar left breast duct containing internal echoes suspected to be debris. This also appears to be a change from prior mammograms. 3. Prominent left axillary lymph nodes suspected to be reactive/benign.  RECOMMENDATION: 1. Ultrasound-guided core needle biopsy of the 3 o'clock position, 8 mm left breast mass. 2. Ultrasound-guided core needle biopsy dilated inferior retroareolar left breast duct. 3. If either of these 2 biopsies demonstrates malignancy, then biopsy of the left axillary lymph node with the thickest cortex would be recommended.   I have discussed the findings and  recommendations with the patient. If applicable, a reminder letter will be sent to the patient regarding the next appointment.   BI-RADS CATEGORY  4: Suspicious.     Electronically Signed   By: Amie Portland M.D.   On: 10/18/2022 11:35   Assessment and Plan:  Diagnoses and all orders for this visit:   Intraductal papilloma of breast, left     After discussion with patient regarding her options, we recommend left breast radioactive seed localized lumpectomy to remove the intraductal papilloma.The surgical procedure has been discussed with the patient.  Potential risks, benefits, alternative treatments, and expected outcomes have been explained.  All of the patient's questions at this time have been answered.  The likelihood of reaching the patient's treatment goal is good.  The patient understand the proposed surgical procedure and wishes to proceed.    Wilmon Arms. Corliss Skains, MD, [facility]  General Surgery   12/25/2022 7:19 AM

## 2022-12-25 NOTE — Anesthesia Postprocedure Evaluation (Signed)
Anesthesia Post Note  Patient: Autumn Carson  Procedure(s) Performed: LEFT BREAST LUMPECTOMY WITH RADIOACTIVE SEED LOCALIZATION (Left: Breast)     Patient location during evaluation: PACU Anesthesia Type: General Level of consciousness: awake and alert Pain management: pain level controlled Vital Signs Assessment: post-procedure vital signs reviewed and stable Respiratory status: spontaneous breathing, nonlabored ventilation, respiratory function stable and patient connected to nasal cannula oxygen Cardiovascular status: blood pressure returned to baseline and stable Postop Assessment: no apparent nausea or vomiting Anesthetic complications: no   No notable events documented.  Last Vitals:  Vitals:   12/25/22 0845 12/25/22 0900  BP: 114/65 (!) 150/68  Pulse: 68 65  Resp: 17 18  Temp:  36.4 C  SpO2: 96% 96%    Last Pain:  Vitals:   12/25/22 0900  PainSc: 0-No pain                 Ginni Eichler

## 2022-12-25 NOTE — Transfer of Care (Signed)
Immediate Anesthesia Transfer of Care Note  Patient: Autumn Carson  Procedure(s) Performed: LEFT BREAST LUMPECTOMY WITH RADIOACTIVE SEED LOCALIZATION (Left: Breast)  Patient Location: PACU  Anesthesia Type:General  Level of Consciousness: drowsy and patient cooperative  Airway & Oxygen Therapy: Patient Spontanous Breathing and Patient connected to face mask oxygen  Post-op Assessment: Report given to RN and Post -op Vital signs reviewed and stable  Post vital signs: Reviewed and stable  Last Vitals:  Vitals Value Taken Time  BP 117/60 12/25/22 0830  Temp    Pulse 72 12/25/22 0831  Resp 16 12/25/22 0831  SpO2 98 % 12/25/22 0831  Vitals shown include unvalidated device data.  Last Pain:  Vitals:   12/25/22 0644  PainSc: 0-No pain         Complications: No notable events documented.

## 2022-12-25 NOTE — Discharge Instructions (Signed)
[facility] Office Phone Number [phone]  BREAST BIOPSY/ PARTIAL MASTECTOMY: POST OP INSTRUCTIONS  Always review your discharge instruction sheet given to you by the facility where your surgery was performed.  IF YOU HAVE DISABILITY OR FAMILY LEAVE FORMS, YOU MUST BRING THEM TO THE OFFICE FOR PROCESSING.  DO NOT GIVE THEM TO YOUR DOCTOR.  A prescription for pain medication may be given to you upon discharge.  Take your pain medication as prescribed, if needed.  If narcotic pain medicine is not needed, then you may take acetaminophen (Tylenol) or ibuprofen (Advil) as needed. Take your usually prescribed medications unless otherwise directed If you need a refill on your pain medication, please contact your pharmacy.  They will contact our office to request authorization.  Prescriptions will not be filled after 5pm or on week-ends. You should eat very light the first 24 hours after surgery, such as soup, crackers, pudding, etc.  Resume your normal diet the day after surgery. Most patients will experience some swelling and bruising in the breast.  Ice packs and a good support bra will help.  Swelling and bruising can take several days to resolve.  It is common to experience some constipation if taking pain medication after surgery.  Increasing fluid intake and taking a stool softener will usually help or prevent this problem from occurring.  A mild laxative (Milk of Magnesia or Miralax) should be taken according to package directions if there are no bowel movements after 48 hours. Unless discharge instructions indicate otherwise, you may remove your bandages 24-48 hours after surgery, and you may shower at that time.  You may have steri-strips (small skin tapes) in place directly over the incision.  These strips should be left on the skin for 7-10 days.  If your surgeon used skin glue on the incision, you may shower in 24 hours.  The glue will flake off over the next 2-3 weeks.  Any  sutures or staples will be removed at the office during your follow-up visit. ACTIVITIES:  You may resume regular daily activities (gradually increasing) beginning the next day.  Wearing a good support bra or sports bra minimizes pain and swelling.  You may have sexual intercourse when it is comfortable. You may drive when you no longer are taking prescription pain medication, you can comfortably wear a seatbelt, and you can safely maneuver your car and apply brakes. RETURN TO WORK:  ______________________________________________________________________________________ You should see your doctor in the office for a follow-up appointment approximately two weeks after your surgery.  Your doctor's nurse will typically make your follow-up appointment when she calls you with your pathology report.  Expect your pathology report 2-3 business days after your surgery.  You may call to check if you do not hear from us after three days. OTHER INSTRUCTIONS: _______________________________________________________________________________________________ _____________________________________________________________________________________________________________________________________ _____________________________________________________________________________________________________________________________________ _____________________________________________________________________________________________________________________________________  WHEN TO CALL YOUR DOCTOR: Fever over 101.0 Nausea and/or vomiting. Extreme swelling or bruising. Continued bleeding from incision. Increased pain, redness, or drainage from the incision.  The clinic staff is available to answer your questions during regular business hours.  Please don't hesitate to call and ask to speak to one of the nurses for clinical concerns.  If you have a medical emergency, go to the nearest emergency room or call 911.  A surgeon from [facility] is always on call at the hospital.  For further questions, please visit [email or web address]   

## 2022-12-26 ENCOUNTER — Encounter (HOSPITAL_COMMUNITY): Payer: Self-pay | Admitting: Surgery

## 2022-12-26 LAB — SURGICAL PATHOLOGY

## 2023-01-02 ENCOUNTER — Other Ambulatory Visit: Payer: Self-pay | Admitting: Nurse Practitioner

## 2023-01-02 DIAGNOSIS — I10 Essential (primary) hypertension: Secondary | ICD-10-CM

## 2023-01-03 NOTE — Telephone Encounter (Signed)
Requested Prescriptions  Pending Prescriptions Disp Refills   amLODipine (NORVASC) 10 MG tablet [Pharmacy Med Name: amLODIPine Besylate 10 MG Oral Tablet] 100 tablet 2    Sig: TAKE 1 TABLET BY MOUTH DAILY     Cardiovascular: Calcium Channel Blockers 2 Failed - 01/02/2023 10:14 PM      Failed - Last BP in normal range    BP Readings from Last 1 Encounters:  12/25/22 (!) 150/68         Passed - Last Heart Rate in normal range    Pulse Readings from Last 1 Encounters:  12/25/22 65         Passed - Valid encounter within last 6 months    Recent Outpatient Visits           1 month ago Type 2 diabetes mellitus with diabetic autonomic neuropathy, without long-term current use of insulin (HCC)   [facility] [place], Shea Stakes, NP   4 months ago Primary hypertension   [facility], Shea Stakes, NP   7 months ago Essential hypertension   [facility] [place], [place] W, NP   1 year ago Controlled type 2 diabetes mellitus with complication, without long-term current use of insulin [facility])   [facility] [place], Shea Stakes, NP   1 year ago Encounter for Papanicolaou smear for cervical cancer screening   [facility] [place], Shea Stakes, NP

## 2023-01-05 ENCOUNTER — Other Ambulatory Visit: Payer: Self-pay | Admitting: Nurse Practitioner

## 2023-01-05 DIAGNOSIS — K219 Gastro-esophageal reflux disease without esophagitis: Secondary | ICD-10-CM

## 2023-01-07 NOTE — Telephone Encounter (Signed)
Recommend office visit due to BMI.

## 2023-01-19 ENCOUNTER — Other Ambulatory Visit: Payer: Self-pay | Admitting: Family Medicine

## 2023-01-19 DIAGNOSIS — R519 Headache, unspecified: Secondary | ICD-10-CM

## 2023-01-19 NOTE — Progress Notes (Deleted)
Referring Provider:*** Primary Care Physician:  Claiborne Rigg, NP Primary Gastroenterologist:  Dr. Marletta Lor  No chief complaint on file.   HPI:   Autumn Carson is a 67 y.o. female presenting today to discuss scheduling screening colonoscopy.  She has history of anemia, anxiety, depression, HTN, HLD, hypothyroidism, osteoporosis, rheumatoid arthritis, type 2 diabetes, thyroid cancer, left breast cancer recently undergoing lumpectomy in May 2024, chronic constipation, GERD, presenting today to discuss scheduling screening colonoscopy.  Last colonoscopy in 2014 with mild diverticulosis throughout the entire colon, rectal bleeding due to moderate size internal hemorrhoids.   Today:      Brother with colon cancer at age 60.   Prior GI procedures: Last colonoscopy in 2014 with mild diverticulosis throughout the entire colon, rectal bleeding due to moderate size internal hemorrhoids.  EGD in 2014 with multiple fundic gland polyps.   Givens capsule in 2014 with small bowel ulcers thought to be secondary to NSAIDs.  Enteroscopy in 2014 with friable/erythematous pylorus, occasional erosion in the small bowel.  Duodenal and gastric biopsies were benign.  BPE April 2017 with tiny Zenker's diverticulum, minimal age-related esophageal dysmotility.  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  Pathology with reflux changes.   Past Medical History:  Diagnosis Date   Anemia    Anxiety    Arthritis of both knees    Back pain    Back pain    Bilateral swelling of feet    Cancer (HCC)    Thyroid   Carpal tunnel syndrome    both wrists   Chest pain    Chronic anemia    Congestive heart failure (CHF) (HCC)    Constipation    Depression 1998   Essential hypertension    Fatty liver    GERD (gastroesophageal reflux disease)    Glaucoma    High cholesterol    History of thyroid cancer 2011   Papillary  thyroid cancer s/p radiation   Hypertension    Hypothyroidism    Joint pain    Lumbar spinal stenosis    Obesity    Osteoarthritis    Palpitations    PONV (postoperative nausea and vomiting)    Rheumatoid arthritis (HCC)    Seasonal allergies    Swallowing difficulty    Type 2 diabetes mellitus with diabetic neuropathy (HCC)     Past Surgical History:  Procedure Laterality Date   BACK SURGERY     BIOPSY N/A 01/16/2013   Procedure: BIOPSY;  Surgeon: West Bali, MD;  [facility];  Service: Endoscopy;  Laterality: N/A;  SMALL BOWEL ULCERS   BRAVO PH STUDY N/A 11/22/2015   Procedure: BRAVO PH STUDY;  Surgeon: West Bali, MD;  [facility];  Service: Endoscopy;  Laterality: N/A;   BREAST BIOPSY Left 10/23/2022   [place] LT BREAST BX W LOC DEV EA ADD LESION IMG BX SPEC US GUIDE 10/23/2022 [facility]   BREAST BIOPSY Left 10/23/2022   [place] LT BREAST BX W LOC DEV 1ST LESION IMG BX SPEC US GUIDE 10/23/2022 [facility]   BREAST BIOPSY Left 12/21/2022   [place] LT RADIOACTIVE SEED LOC 12/21/2022 [facility]   BREAST LUMPECTOMY WITH RADIOACTIVE SEED LOCALIZATION Left 12/25/2022   Procedure: LEFT BREAST LUMPECTOMY WITH RADIOACTIVE SEED LOCALIZATION;  Surgeon: Manus Rudd, MD;  Location: [facility];  Service: General;  Laterality: Left;   CARPAL TUNNEL RELEASE     4 total,  bilaterally   CESAREAN SECTION     CHOLECYSTECTOMY  1990's   COLONOSCOPY WITH ESOPHAGOGASTRODUODENOSCOPY (EGD) N/A 12/08/2012   SLF: 1. Normal mucosa in the terminal ileum 2. mild diverticulosis throughout the entire examined colon. 3. rectal bleeding due to moderate sized internal hemorrhoids.    ELBOW SURGERY Bilateral    ENTEROSCOPY N/A 01/16/2013   Procedure: ENTEROSCOPY;  Surgeon: West Bali, MD;  [facility];  Service: Endoscopy;  Laterality: N/A;  2:00   ESOPHAGOGASTRODUODENOSCOPY N/A 11/22/2015   Procedure: ESOPHAGOGASTRODUODENOSCOPY (EGD);  Surgeon: West Bali,  MD;  [facility];  Service: Endoscopy;  Laterality: N/A;  130 - moved to 2:00 - office to notify pt   FOOT SURGERY Left 1999   bone spur   GIVENS CAPSULE STUDY N/A 12/31/2012   Procedure: GIVENS CAPSULE STUDY;  Surgeon: West Bali, MD;  [facility];  Service: Endoscopy;  Laterality: N/A;  7:30   ROTATOR CUFF REPAIR Bilateral    bilaterally   SAVORY DILATION N/A 11/22/2015   Procedure: SAVORY DILATION;  Surgeon: West Bali, MD;  [facility];  Service: Endoscopy;  Laterality: N/A;   SPINE SURGERY     Lumbar Fusion- Dr. Dutch Quint   TONSILECTOMY, ADENOIDECTOMY, BILATERAL MYRINGOTOMY AND TUBES  1987   TONSILLECTOMY  1987   TOTAL THYROIDECTOMY  2011   TUBAL LIGATION      Current Outpatient Medications  Medication Sig Dispense Refill   amitriptyline (ELAVIL) 10 MG tablet TAKE 1 TABLET BY MOUTH AT  BEDTIME 100 tablet 0   amLODipine (NORVASC) 10 MG tablet TAKE 1 TABLET BY MOUTH DAILY 100 tablet 0   aspirin 81 MG tablet Take 81 mg by mouth every other day.     atorvastatin (LIPITOR) 40 MG tablet Take 1 tablet (40 mg total) by mouth daily. 90 tablet 3   Blood Glucose Monitoring Suppl (TRUE METRIX AIR GLUCOSE METER) w/Device KIT USE AS DIRECTED 1 kit 0   celecoxib (CELEBREX) 100 MG capsule TAKE 1 CAPSULE(100 MG) BY MOUTH TWICE DAILY AS NEEDED FOR MODERATE PAIN 60 capsule 2   chlorthalidone (HYGROTON) 25 MG tablet TAKE 1 TABLET BY MOUTH DAILY 100 tablet 2   ciclopirox (PENLAC) 8 % solution Apply one coat to toenail qd for 48 weeks.  Remove weekly with polish remover. (Patient taking differently: Apply 1 Application topically 3 (three) times a week.   Remove weekly with polish remover.) 6.6 mL 11   diclofenac Sodium (VOLTAREN) 1 % GEL Apply 1 Application topically 2 (two) times daily.     FLUoxetine (PROZAC) 40 MG capsule Take 40 mg by mouth daily.     gabapentin (NEURONTIN) 300 MG capsule Take 300 mg by mouth 3 (three) times daily.     hydrALAZINE (APRESOLINE)  10 MG tablet Take 1 tablet (10 mg total) by mouth 3 (three) times daily. 270 tablet 3   levothyroxine (SYNTHROID) 175 MCG tablet TAKE 1 TABLET BY MOUTH DAILY  BEFORE BREAKFAST 100 tablet 2   metFORMIN (GLUCOPHAGE) 1000 MG tablet TAKE 1 TABLET BY MOUTH TWICE  DAILY WITH MEALS 180 tablet 2   nebivolol (BYSTOLIC) 10 MG tablet TAKE 1 TABLET BY MOUTH DAILY 100 tablet 2   omeprazole (PRILOSEC) 40 MG capsule TAKE 1 CAPSULE BY MOUTH DAILY 100 capsule 2   TRUE METRIX BLOOD GLUCOSE TEST test strip CHECK FINGER STICK BLOOD SUGAR TWICE DAILY AS DIRECTED 200 strip 3   zolpidem (AMBIEN) 5 MG tablet TAKE 1 TABLET EVERY NIGHT  AT BEDTIME AS NEEDED FOR SLEEP 30 tablet 3   No current facility-administered medications for this visit.    Allergies as of 01/21/2023 - Review Complete 12/25/2022  Allergen Reaction Noted   Lisinopril Anaphylaxis 04/15/2016    Family History  Problem Relation Age of Onset   Stroke Mother    Hyperlipidemia Mother    Hypertension Mother    Heart disease Mother    Sudden death Mother    Alcoholism Mother    Cancer Father    Heart disease Father    Hyperlipidemia Father    Lung cancer Father        Age 62   Breast cancer Sister    Heart attack Daughter 96   Hypertension Daughter    Breast cancer Cousin    Colon cancer Brother        Age 33   Other Brother        Agent orange, age 76, cancer    Social History   Socioeconomic History   Marital status: Legally Separated    Spouse name: Not on file   Number of children: 2   Years of education: Not on file   Highest education level: Associate degree: occupational, Scientist, product/process development, or vocational program  Occupational History   Occupation: Disabled    Employer: UNEMPLOYED  Tobacco Use   Smoking status: Never    Passive exposure: Past   Smokeless tobacco: Never  Vaping Use   Vaping Use: Never used  Substance and Sexual Activity   Alcohol use: Yes    Comment: Occasional   Drug use: No   Sexual activity: Not Currently     Birth control/protection: Surgical  Other Topics Concern   Not on file  Social History Narrative   Not on file   Social Determinants of Health   Financial Resource Strain: Low Risk  (11/29/2022)   Overall Financial Resource Strain (CARDIA)    Difficulty of Paying Living Expenses: Not very hard  Food Insecurity: No Food Insecurity (11/29/2022)   Hunger Vital Sign    Worried About Running Out of Food in the Last Year: Never true    Ran Out of Food in the Last Year: Never true  Transportation Needs: No Transportation Needs (11/29/2022)   PRAPARE - Administrator, Civil Service (Medical): No    Lack of Transportation (Non-Medical): No  Physical Activity: Sufficiently Active (11/29/2022)   Exercise Vital Sign    Days of Exercise per Week: 4 days    Minutes of Exercise per Session: 40 min  Stress: Stress Concern Present (11/29/2022)   [employer] of Occupational Health - Occupational Stress Questionnaire    Feeling of Stress : To some extent  Social Connections: Moderately Integrated (11/29/2022)   Social Connection and Isolation Panel [NHANES]    Frequency of Communication with Friends and Family: More than three times a week    Frequency of Social Gatherings with Friends and Family: More than three times a week    Attends Religious Services: More than 4 times per year    Active Member of [employer] or Organizations: Yes    Attends Banker Meetings: 1 to 4 times per year    Marital Status: Separated  Intimate Partner Violence: Not At Risk (06/01/2022)   Humiliation, Afraid, Rape, and Kick questionnaire    Fear of Current or Ex-Partner: No    Emotionally Abused: No    Physically Abused: No    Sexually Abused: No    Review of  Systems: Gen: Denies any fever, chills, fatigue, weight loss, lack of appetite.  CV: Denies chest pain, heart palpitations, peripheral edema, syncope.  Resp: Denies shortness of breath at rest or with exertion. Denies wheezing or cough.   GI: Denies dysphagia or odynophagia. Denies jaundice, hematemesis, fecal incontinence. GU : Denies urinary burning, urinary frequency, urinary hesitancy MS: Denies joint pain, muscle weakness, cramps, or limitation of movement.  Derm: Denies rash, itching, dry skin Psych: Denies depression, anxiety, memory loss, and confusion Heme: Denies bruising, bleeding, and enlarged lymph nodes.  Physical Exam: LMP 09/07/2011  General:   Alert and oriented. Pleasant and cooperative. Well-nourished and well-developed.  Head:  Normocephalic and atraumatic. Eyes:  Without icterus, sclera clear and conjunctiva pink.  Ears:  Normal auditory acuity. Lungs:  Clear to auscultation bilaterally. No wheezes, rales, or rhonchi. No distress.  Heart:  S1, S2 present without murmurs appreciated.  Abdomen:  +BS, soft, non-tender and non-distended. No HSM noted. No guarding or rebound. No masses appreciated.  Rectal:  Deferred  Msk:  Symmetrical without gross deformities. Normal posture. Extremities:  Without edema. Neurologic:  Alert and  oriented x4;  grossly normal neurologically. Skin:  Intact without significant lesions or rashes. Psych:  Alert and cooperative. Normal mood and affect.    Assessment:     Plan:  ***   Ermalinda Memos, PA-C [facility] 01/21/2023

## 2023-01-21 ENCOUNTER — Ambulatory Visit: Payer: 59 | Admitting: Gastroenterology

## 2023-01-21 NOTE — Telephone Encounter (Signed)
Requested Prescriptions  Pending Prescriptions Disp Refills   amitriptyline (ELAVIL) 10 MG tablet [Pharmacy Med Name: Amitriptyline HCl 10 MG Oral Tablet] 100 tablet 0    Sig: TAKE 1 TABLET BY MOUTH AT  BEDTIME     Psychiatry:  Antidepressants - Heterocyclics (TCAs) Passed - 01/19/2023 10:20 PM      Passed - Completed PHQ-2 or PHQ-9 in the last 360 days      Passed - Valid encounter within last 6 months    Recent Outpatient Visits           1 month ago Type 2 diabetes mellitus with diabetic autonomic neuropathy, without long-term current use of insulin [facility])   [facility] [place], Shea Stakes, NP   4 months ago Primary hypertension   [place] [facility] [place], Shea Stakes, NP   7 months ago Essential hypertension   [place] [facility] [place], [place] W, NP   1 year ago Controlled type 2 diabetes mellitus with complication, without long-term current use of insulin [facility])   [place] [facility] [place], Shea Stakes, NP   1 year ago Encounter for Papanicolaou smear for cervical cancer screening   [place] [place], Shea Stakes, NP

## 2023-02-12 NOTE — Patient Instructions (Incomplete)
Autumn Carson , Thank you for taking time to come for your Medicare Wellness Visit. I appreciate your ongoing commitment to your health goals. Please review the following plan we discussed and let me know if I can assist you in the future.   These are the goals we discussed:  Goals      Maintain Mobility and Function     Evidence-based guidance:  Emphasize the importance of physical activity and aerobic exercise as included in treatment plan; assess barriers to adherence; consider patient's abilities and preferences.  Encourage gradual increase in activity or exercise instead of stopping if pain occurs.  Reinforce individual therapy exercise prescription, such as strengthening, stabilization and stretching programs.  Promote optimal body mechanics to stabilize the spine with lifting and functional activity.  Encourage activity and mobility modifications to facilitate optimal function, such as using a log roll for bed mobility or dressing from a seated position.  Reinforce individual adaptive equipment recommendations to limit excessive spinal movements, such as a Event organiser.  Assess adequacy of sleep; encourage use of sleep hygiene techniques, such as bedtime routine; use of white noise; dark, cool bedroom; avoiding daytime naps, heavy meals or exercise before bedtime.  Promote positions and modification to optimize sleep and sexual activity; consider pillows or positioning devices to assist in maintaining neutral spine.  Explore options for applying ergonomic principles at work and home, such as frequent position changes, using ergonomically designed equipment and working at optimal height.  Promote modifications to increase comfort with driving such as lumbar support, optimizing seat and steering wheel position, using cruise control and taking frequent rest stops to stretch and walk.   Notes:      Remain active and independent        This is a list of the screening  recommended for you and due dates:  Health Maintenance  Topic Date Due   COVID-19 Vaccine (4 - 2023-24 season) 03/30/2022   Complete foot exam   11/28/2022   Eye exam for diabetics  12/09/2022   Colon Cancer Screening  12/09/2022   Flu Shot  02/28/2023   Hemoglobin A1C  06/05/2023   Yearly kidney health urinalysis for diabetes  09/01/2023   Yearly kidney function blood test for diabetes  12/17/2023   Medicare Annual Wellness Visit  02/13/2024   Pap Smear  07/10/2024   Mammogram  10/07/2024   DTaP/Tdap/Td vaccine (2 - Td or Tdap) 05/02/2031   Pneumonia Vaccine  Completed   DEXA scan (bone density measurement)  Completed   Hepatitis C Screening  Completed   HIV Screening  Completed   Zoster (Shingles) Vaccine  Completed   HPV Vaccine  Aged Out    Advanced directives: Information on Advanced Care Planning can be found at [facility] Advance Health Care Directives Advance Health Care Directives ([email or web address] Please bring a copy of your health care power of attorney and living will to the office to be added to your chart at your convenience.  Conditions/risks identified: Aim for 30 minutes of exercise or brisk walking, 6-8 glasses of water, and 5 servings of fruits and vegetables each day.  Next appointment: Follow up in one year for your annual wellness visit    Preventive Care 65 Years and Older, Female Preventive care refers to lifestyle choices and visits with your health care provider that can promote health and wellness. What does preventive care include? A yearly physical exam. This is also called an annual well check. Dental exams once or  twice a year. Routine eye exams. Ask your health care provider how often you should have your eyes checked. Personal lifestyle choices, including: Daily care of your teeth and gums. Regular physical activity. Eating a healthy diet. Avoiding tobacco and drug use. Limiting alcohol use. Practicing safe sex. Taking  low-dose aspirin every day. Taking vitamin and mineral supplements as recommended by your health care provider. What happens during an annual well check? The services and screenings done by your health care provider during your annual well check will depend on your age, overall health, lifestyle risk factors, and family history of disease. Counseling  Your health care provider may ask you questions about your: Alcohol use. Tobacco use. Drug use. Emotional well-being. Home and relationship well-being. Sexual activity. Eating habits. History of falls. Memory and ability to understand (cognition). Work and work Astronomer. Reproductive health. Screening  You may have the following tests or measurements: Height, weight, and BMI. Blood pressure. Lipid and cholesterol levels. These may be checked every 5 years, or more frequently if you are over 11 years old. Skin check. Lung cancer screening. You may have this screening every year starting at age 66 if you have a 30-pack-year history of smoking and currently smoke or have quit within the past 15 years. Fecal occult blood test (FOBT) of the stool. You may have this test every year starting at age 43. Flexible sigmoidoscopy or colonoscopy. You may have a sigmoidoscopy every 5 years or a colonoscopy every 10 years starting at age 88. Hepatitis C blood test. Hepatitis B blood test. Sexually transmitted disease (STD) testing. Diabetes screening. This is done by checking your blood sugar (glucose) after you have not eaten for a while (fasting). You may have this done every 1-3 years. Bone density scan. This is done to screen for osteoporosis. You may have this done starting at age 18. Mammogram. This may be done every 1-2 years. Talk to your health care provider about how often you should have regular mammograms. Talk with your health care provider about your test results, treatment options, and if necessary, the need for more tests. Vaccines   Your health care provider may recommend certain vaccines, such as: Influenza vaccine. This is recommended every year. Tetanus, diphtheria, and acellular pertussis (Tdap, Td) vaccine. You may need a Td booster every 10 years. Zoster vaccine. You may need this after age 70. Pneumococcal 13-valent conjugate (PCV13) vaccine. One dose is recommended after age 65. Pneumococcal polysaccharide (PPSV23) vaccine. One dose is recommended after age 59. Talk to your health care provider about which screenings and vaccines you need and how often you need them. This information is not intended to replace advice given to you by your health care provider. Make sure you discuss any questions you have with your health care provider. Document Released: 08/12/2015 Document Revised: 04/04/2016 Document Reviewed: 05/17/2015 Elsevier Interactive Patient Education  2017 [employer].  Fall Prevention in the Home Falls can cause injuries. They can happen to people of all ages. There are many things you can do to make your home safe and to help prevent falls. What can I do on the outside of my home? Regularly fix the edges of walkways and driveways and fix any cracks. Remove anything that might make you trip as you walk through a door, such as a raised step or threshold. Trim any bushes or trees on the path to your home. Use bright outdoor lighting. Clear any walking paths of anything that might make someone trip, such as  rocks or tools. Regularly check to see if handrails are loose or broken. Make sure that both sides of any steps have handrails. Any raised decks and porches should have guardrails on the edges. Have any leaves, snow, or ice cleared regularly. Use sand or salt on walking paths during winter. Clean up any spills in your garage right away. This includes oil or grease spills. What can I do in the bathroom? Use night lights. Install grab bars by the toilet and in the tub and shower. Do not use towel  bars as grab bars. Use non-skid mats or decals in the tub or shower. If you need to sit down in the shower, use a plastic, non-slip stool. Keep the floor dry. Clean up any water that spills on the floor as soon as it happens. Remove soap buildup in the tub or shower regularly. Attach bath mats securely with double-sided non-slip rug tape. Do not have throw rugs and other things on the floor that can make you trip. What can I do in the bedroom? Use night lights. Make sure that you have a light by your bed that is easy to reach. Do not use any sheets or blankets that are too big for your bed. They should not hang down onto the floor. Have a firm chair that has side arms. You can use this for support while you get dressed. Do not have throw rugs and other things on the floor that can make you trip. What can I do in the kitchen? Clean up any spills right away. Avoid walking on wet floors. Keep items that you use a lot in easy-to-reach places. If you need to reach something above you, use a strong step stool that has a grab bar. Keep electrical cords out of the way. Do not use floor polish or wax that makes floors slippery. If you must use wax, use non-skid floor wax. Do not have throw rugs and other things on the floor that can make you trip. What can I do with my stairs? Do not leave any items on the stairs. Make sure that there are handrails on both sides of the stairs and use them. Fix handrails that are broken or loose. Make sure that handrails are as long as the stairways. Check any carpeting to make sure that it is firmly attached to the stairs. Fix any carpet that is loose or worn. Avoid having throw rugs at the top or bottom of the stairs. If you do have throw rugs, attach them to the floor with carpet tape. Make sure that you have a light switch at the top of the stairs and the bottom of the stairs. If you do not have them, ask someone to add them for you. What else can I do to help  prevent falls? Wear shoes that: Do not have high heels. Have rubber bottoms. Are comfortable and fit you well. Are closed at the toe. Do not wear sandals. If you use a stepladder: Make sure that it is fully opened. Do not climb a closed stepladder. Make sure that both sides of the stepladder are locked into place. Ask someone to hold it for you, if possible. Clearly mark and make sure that you can see: Any grab bars or handrails. First and last steps. Where the edge of each step is. Use tools that help you move around (mobility aids) if they are needed. These include: Canes. Walkers. Scooters. Crutches. Turn on the lights when you go into a dark  area. Replace any light bulbs as soon as they burn out. Set up your furniture so you have a clear path. Avoid moving your furniture around. If any of your floors are uneven, fix them. If there are any pets around you, be aware of where they are. Review your medicines with your doctor. Some medicines can make you feel dizzy. This can increase your chance of falling. Ask your doctor what other things that you can do to help prevent falls. This information is not intended to replace advice given to you by your health care provider. Make sure you discuss any questions you have with your health care provider. Document Released: 05/12/2009 Document Revised: 12/22/2015 Document Reviewed: 08/20/2014 Elsevier Interactive Patient Education  2017 [employer].

## 2023-02-12 NOTE — Progress Notes (Unsigned)
Subjective:   Autumn Carson is a 67 y.o. female who presents for Medicare Annual (Subsequent) preventive examination.  Visit Complete: {VISITMETHOD:[phone]}  Patient Medicare AWV questionnaire was completed by the patient on ***; I have confirmed that all information answered by patient is correct and no changes since this date.  Review of Systems    ***       Objective:    There were no vitals filed for this visit. There is no height or weight on file to calculate BMI.     12/17/2022    9:37 AM 06/01/2022   10:30 AM 10/22/2021    7:48 AM 02/10/2020   10:26 AM 09/29/2019   10:31 AM 12/09/2018    2:46 PM 03/10/2018    8:33 AM  Advanced Directives  Does Patient Have a Medical Advance Directive? No No No Yes No;Yes Yes No  Type of Advance Directive    Living will  Living will   Does patient want to make changes to medical advance directive?    No - Patient declined No - Patient declined No - Patient declined   Would patient like information on creating a medical advance directive? No - Patient declined Yes (ED - Information included in AVS) No - Patient declined  No - Patient declined  No - Patient declined    Current Medications (verified) Outpatient Encounter Medications as of 02/13/2023  Medication Sig   amitriptyline (ELAVIL) 10 MG tablet TAKE 1 TABLET BY MOUTH AT  BEDTIME   amLODipine (NORVASC) 10 MG tablet TAKE 1 TABLET BY MOUTH DAILY   aspirin 81 MG tablet Take 81 mg by mouth every other day.   atorvastatin (LIPITOR) 40 MG tablet Take 1 tablet (40 mg total) by mouth daily.   Blood Glucose Monitoring Suppl (TRUE METRIX AIR GLUCOSE METER) w/Device KIT USE AS DIRECTED   celecoxib (CELEBREX) 100 MG capsule TAKE 1 CAPSULE(100 MG) BY MOUTH TWICE DAILY AS NEEDED FOR MODERATE PAIN   chlorthalidone (HYGROTON) 25 MG tablet TAKE 1 TABLET BY MOUTH DAILY   ciclopirox (PENLAC) 8 % solution Apply one coat to toenail qd for 48 weeks.  Remove weekly with polish remover. (Patient  taking differently: Apply 1 Application topically 3 (three) times a week.   Remove weekly with polish remover.)   diclofenac Sodium (VOLTAREN) 1 % GEL Apply 1 Application topically 2 (two) times daily.   FLUoxetine (PROZAC) 40 MG capsule Take 40 mg by mouth daily.   gabapentin (NEURONTIN) 300 MG capsule Take 300 mg by mouth 3 (three) times daily.   hydrALAZINE (APRESOLINE) 10 MG tablet Take 1 tablet (10 mg total) by mouth 3 (three) times daily.   levothyroxine (SYNTHROID) 175 MCG tablet TAKE 1 TABLET BY MOUTH DAILY  BEFORE BREAKFAST   metFORMIN (GLUCOPHAGE) 1000 MG tablet TAKE 1 TABLET BY MOUTH TWICE  DAILY WITH MEALS   nebivolol (BYSTOLIC) 10 MG tablet TAKE 1 TABLET BY MOUTH DAILY   omeprazole (PRILOSEC) 40 MG capsule TAKE 1 CAPSULE BY MOUTH DAILY   TRUE METRIX BLOOD GLUCOSE TEST test strip CHECK FINGER STICK BLOOD SUGAR TWICE DAILY AS DIRECTED   zolpidem (AMBIEN) 5 MG tablet TAKE 1 TABLET EVERY NIGHT AT BEDTIME AS NEEDED FOR SLEEP   No facility-administered encounter medications on file as of 02/13/2023.    Allergies (verified) Lisinopril   History: Past Medical History:  Diagnosis Date   Anemia    Anxiety    Arthritis of both knees    Back pain    Back  pain    Bilateral swelling of feet    Cancer (HCC)    Thyroid   Carpal tunnel syndrome    both wrists   Chest pain    Chronic anemia    Congestive heart failure (CHF) (HCC)    Constipation    Depression 1998   Essential hypertension    Fatty liver    GERD (gastroesophageal reflux disease)    Glaucoma    High cholesterol    History of thyroid cancer 2011   Papillary thyroid cancer s/p radiation   Hypertension    Hypothyroidism    Joint pain    Lumbar spinal stenosis    Obesity    Osteoarthritis    Palpitations    PONV (postoperative nausea and vomiting)    Rheumatoid arthritis (HCC)    Seasonal allergies    Swallowing difficulty    Type 2 diabetes mellitus with diabetic neuropathy (HCC)    Past Surgical History:   Procedure Laterality Date   BACK SURGERY     BIOPSY N/A 01/16/2013   Procedure: BIOPSY;  Surgeon: West Bali, MD;  [facility]: [facility];  Service: Endoscopy;  Laterality: N/A;  SMALL BOWEL ULCERS   BRAVO PH STUDY N/A 11/22/2015   Procedure: BRAVO PH STUDY;  Surgeon: West Bali, MD;  [facility]: [facility];  Service: Endoscopy;  Laterality: N/A;   BREAST BIOPSY Left 10/23/2022   [place] LT BREAST BX W LOC DEV EA ADD LESION IMG BX SPEC US GUIDE 10/23/2022 [facility]   BREAST BIOPSY Left 10/23/2022   [place] LT BREAST BX W LOC DEV 1ST LESION IMG BX SPEC US GUIDE 10/23/2022 [facility]   BREAST BIOPSY Left 12/21/2022   [place] LT RADIOACTIVE SEED LOC 12/21/2022 [facility]   BREAST LUMPECTOMY WITH RADIOACTIVE SEED LOCALIZATION Left 12/25/2022   Procedure: LEFT BREAST LUMPECTOMY WITH RADIOACTIVE SEED LOCALIZATION;  Surgeon: Manus Rudd, MD;  Location: [facility];  Service: General;  Laterality: Left;   CARPAL TUNNEL RELEASE     4 total, bilaterally   CESAREAN SECTION     CHOLECYSTECTOMY  1990's   COLONOSCOPY WITH ESOPHAGOGASTRODUODENOSCOPY (EGD) N/A 12/08/2012   SLF: 1. Normal mucosa in the terminal ileum 2. mild diverticulosis throughout the entire examined colon. 3. rectal bleeding due to moderate sized internal hemorrhoids.    ELBOW SURGERY Bilateral    ENTEROSCOPY N/A 01/16/2013   Procedure: ENTEROSCOPY;  Surgeon: West Bali, MD;  [facility];  Service: Endoscopy;  Laterality: N/A;  2:00   ESOPHAGOGASTRODUODENOSCOPY N/A 11/22/2015   Procedure: ESOPHAGOGASTRODUODENOSCOPY (EGD);  Surgeon: West Bali, MD;  [facility];  Service: Endoscopy;  Laterality: N/A;  130 - moved to 2:00 - office to notify pt   FOOT SURGERY Left 1999   bone spur   GIVENS CAPSULE STUDY N/A 12/31/2012   Procedure: GIVENS CAPSULE STUDY;  Surgeon: West Bali, MD;  [facility];  Service: Endoscopy;  Laterality: N/A;  7:30   ROTATOR CUFF REPAIR Bilateral     bilaterally   SAVORY DILATION N/A 11/22/2015   Procedure: SAVORY DILATION;  Surgeon: West Bali, MD;  [facility];  Service: Endoscopy;  Laterality: N/A;   SPINE SURGERY     Lumbar Fusion- Dr. Dutch Quint   TONSILECTOMY, ADENOIDECTOMY, BILATERAL MYRINGOTOMY AND TUBES  1987   TONSILLECTOMY  1987   TOTAL THYROIDECTOMY  2011   TUBAL LIGATION     Family History  Problem Relation Age of Onset  Stroke Mother    Hyperlipidemia Mother    Hypertension Mother    Heart disease Mother    Sudden death Mother    Alcoholism Mother    Cancer Father    Heart disease Father    Hyperlipidemia Father    Lung cancer Father        Age 79   Breast cancer Sister    Heart attack Daughter 7   Hypertension Daughter    Breast cancer Cousin    Colon cancer Brother        Age 64   Other Brother        Agent orange, age 42, cancer   Social History   Socioeconomic History   Marital status: Legally Separated    Spouse name: Not on file   Number of children: 2   Years of education: Not on file   Highest education level: Associate degree: occupational, Scientist, product/process development, or vocational program  Occupational History   Occupation: Disabled    Employer: UNEMPLOYED  Tobacco Use   Smoking status: Never    Passive exposure: Past   Smokeless tobacco: Never  Vaping Use   Vaping status: Never Used  Substance and Sexual Activity   Alcohol use: Yes    Comment: Occasional   Drug use: No   Sexual activity: Not Currently    Birth control/protection: Surgical  Other Topics Concern   Not on file  Social History Narrative   Not on file   Social Determinants of Health   Financial Resource Strain: Low Risk  (11/29/2022)   Overall Financial Resource Strain (CARDIA)    Difficulty of Paying Living Expenses: Not very hard  Food Insecurity: No Food Insecurity (11/29/2022)   Hunger Vital Sign    Worried About Running Out of Food in the Last Year: Never true    Ran Out of Food in the Last Year: Never true   Transportation Needs: No Transportation Needs (11/29/2022)   PRAPARE - Administrator, Civil Service (Medical): No    Lack of Transportation (Non-Medical): No  Physical Activity: Sufficiently Active (11/29/2022)   Exercise Vital Sign    Days of Exercise per Week: 4 days    Minutes of Exercise per Session: 40 min  Stress: Stress Concern Present (11/29/2022)   [employer] of Occupational Health - Occupational Stress Questionnaire    Feeling of Stress : To some extent  Social Connections: Moderately Integrated (11/29/2022)   Social Connection and Isolation Panel [NHANES]    Frequency of Communication with Friends and Family: More than three times a week    Frequency of Social Gatherings with Friends and Family: More than three times a week    Attends Religious Services: More than 4 times per year    Active Member of [employer] or Organizations: Yes    Attends Banker Meetings: 1 to 4 times per year    Marital Status: Separated    Tobacco Counseling Counseling given: Not Answered   Clinical Intake:                        Activities of Daily Living    12/17/2022    9:35 AM 12/17/2022    9:31 AM  In your present state of health, do you have any difficulty performing the following activities:  Hearing?  0  Vision?  0  Difficulty concentrating or making decisions?  0  Walking or climbing stairs?  1  Comment  climbing stairs with  2 bad knees  Dressing or bathing?  0  Doing errands, shopping? 0     Patient Care Team: Claiborne Rigg, NP as PCP - General (Nurse Practitioner)  Indicate any recent Medical Services you may have received from other than [facility] providers in the past year (date may be approximate).     Assessment:   This is a routine wellness examination for Marlo.  Hearing/Vision screen No results found.  Dietary issues and exercise activities discussed:     Goals Addressed   None    Depression Screen    12/03/2022    8:47  AM 08/31/2022    8:48 AM 06/01/2022   10:28 AM 05/30/2022    9:37 AM 11/27/2021    3:06 PM 09/12/2021    3:54 PM 07/10/2021   10:26 AM  PHQ 2/9 Scores  PHQ - 2 Score 0 1 0 5 2 4 1   PHQ- 9 Score 1 5  14 5 10 1     Fall Risk    12/03/2022    8:40 AM 08/31/2022    8:39 AM 06/01/2022   10:30 AM 05/31/2022   10:47 AM 05/30/2022    9:04 AM  Fall Risk   Falls in the past year? 0 1 0 1 1  Number falls in past yr: 0 1 0 0 0  Injury with Fall? 0 0 0 0 0  Risk for fall due to : No Fall Risks No Fall Risks No Fall Risks    Follow up Falls evaluation completed Falls evaluation completed   Falls evaluation completed    MEDICARE RISK AT HOME:   TIMED UP AND GO:  Was the test performed?  No    Cognitive Function:    06/01/2022   10:33 AM  MMSE - Mini Mental State Exam  Orientation to time 5  Orientation to Place 5  Registration 3  Attention/ Calculation 5  Recall 3  Language- name 2 objects 2  Language- repeat 1  Language- follow 3 step command 3  Language- read & follow direction 1  Write a sentence 1  Copy design 1  Total score 30        06/01/2022   10:32 AM  6CIT Screen  What Year? 0 points  What month? 0 points  What time? 0 points  Count back from 20 0 points  Months in reverse 0 points  Repeat phrase 0 points  Total Score 0 points    Immunizations Immunization History  Administered Date(s) Administered   Influenza Split 03/30/2014   Influenza,inj,Quad PF,6+ Mos 04/17/2016, 05/30/2017, 04/30/2019, 04/09/2020, 04/26/2021, 05/15/2022   Influenza,trivalent, recombinat, inj, PF 05/08/2015   Influenza-Unspecified 05/10/2018   Moderna Sars-Covid-2 Vaccination 09/29/2019, 10/27/2019, 05/31/2020   PNEUMOCOCCAL CONJUGATE-20 07/10/2021   Pneumococcal Polysaccharide-23 05/13/2013   Tdap 05/01/2021   Zoster Recombinant(Shingrix) 08/08/2020, 10/06/2020    TDAP status: Up to date  Pneumococcal vaccine status: Up to date  Covid-19 vaccine status: Information provided on how  to obtain vaccines.   Qualifies for Shingles Vaccine? Yes   Zostavax completed No   Shingrix Completed?: Yes  Screening Tests Health Maintenance  Topic Date Due   COVID-19 Vaccine (4 - 2023-24 season) 03/30/2022   FOOT EXAM  11/28/2022   OPHTHALMOLOGY EXAM  12/09/2022   Colonoscopy  12/09/2022   INFLUENZA VACCINE  02/28/2023   Medicare Annual Wellness (AWV)  06/02/2023   HEMOGLOBIN A1C  06/05/2023   Diabetic kidney evaluation - Urine ACR  09/01/2023   Diabetic kidney evaluation -  eGFR measurement  12/17/2023   PAP SMEAR-Modifier  07/10/2024   MAMMOGRAM  10/07/2024   DTaP/Tdap/Td (2 - Td or Tdap) 05/02/2031   Pneumonia Vaccine 13+ Years old  Completed   DEXA SCAN  Completed   Hepatitis C Screening  Completed   HIV Screening  Completed   Zoster Vaccines- Shingrix  Completed   HPV VACCINES  Aged Out    Health Maintenance  Health Maintenance Due  Topic Date Due   COVID-19 Vaccine (4 - 2023-24 season) 03/30/2022   FOOT EXAM  11/28/2022   OPHTHALMOLOGY EXAM  12/09/2022   Colonoscopy  12/09/2022    Colorectal cancer screening: Type of screening: {Colorectal Screening Types:24994}. Completed ***. Repeat every *** years  Mammogram status: Completed 10/08/22. Repeat every year  Bone Density status: Completed 08/15/21. Results reflect: Bone density results: NORMAL. Repeat every 5 years.  Lung Cancer Screening: (Low Dose CT Chest recommended if Age 38-80 years, 20 pack-year currently smoking OR have quit w/in 15years.) does not qualify.   Lung Cancer Screening Referral: n/a  Additional Screening:  Hepatitis C Screening: does qualify; Completed 03/04/17  Vision Screening: Recommended annual ophthalmology exams for early detection of glaucoma and other disorders of the eye. Is the patient up to date with their annual eye exam?  {YES/NO:21197} Who is the provider or what is the name of the office in which the patient attends annual eye exams? *** If pt is not established with a  provider, would they like to be referred to a provider to establish care? {YES/NO:21197}.   Dental Screening: Recommended annual dental exams for proper oral hygiene  Diabetic Foot Exam: Diabetic Foot Exam: Overdue, Pt has been advised about the importance in completing this exam. Pt is scheduled for diabetic foot exam on at next office visit.  Community Resource Referral / Chronic Care Management: CRR required this visit?  {YES/NO:21197}  CCM required this visit?  {CCM Required choices:[phone]}     Plan:     I have personally reviewed and noted the following in the patient's chart:   Medical and social history Use of alcohol, tobacco or illicit drugs  Current medications and supplements including opioid prescriptions. {Opioid Prescriptions:[phone]} Functional ability and status Nutritional status Physical activity Advanced directives List of other physicians Hospitalizations, surgeries, and ER visits in previous 12 months Vitals Screenings to include cognitive, depression, and falls Referrals and appointments  In addition, I have reviewed and discussed with patient certain preventive protocols, quality metrics, and best practice recommendations. A written personalized care plan for preventive services as well as general preventive health recommendations were provided to patient.     Kandis Fantasia McDonald, [place]   02/06/6268   After Visit Summary: {CHL AMB AWV After Visit Summary:[id number]}  Nurse Notes: ***

## 2023-02-13 ENCOUNTER — Ambulatory Visit: Payer: 59 | Attending: Nurse Practitioner

## 2023-02-13 VITALS — Ht 65.0 in | Wt 303.0 lb

## 2023-02-13 DIAGNOSIS — Z0000 Encounter for general adult medical examination without abnormal findings: Secondary | ICD-10-CM

## 2023-02-26 ENCOUNTER — Ambulatory Visit: Payer: 59 | Admitting: Podiatry

## 2023-03-23 ENCOUNTER — Other Ambulatory Visit: Payer: Self-pay | Admitting: Nurse Practitioner

## 2023-03-23 DIAGNOSIS — I10 Essential (primary) hypertension: Secondary | ICD-10-CM

## 2023-03-28 DIAGNOSIS — E890 Postprocedural hypothyroidism: Secondary | ICD-10-CM | POA: Diagnosis not present

## 2023-04-03 ENCOUNTER — Other Ambulatory Visit: Payer: Self-pay | Admitting: Nurse Practitioner

## 2023-04-03 DIAGNOSIS — M5136 Other intervertebral disc degeneration, lumbar region: Secondary | ICD-10-CM

## 2023-04-03 DIAGNOSIS — M170 Bilateral primary osteoarthritis of knee: Secondary | ICD-10-CM

## 2023-04-24 ENCOUNTER — Other Ambulatory Visit: Payer: Self-pay | Admitting: Family Medicine

## 2023-04-24 DIAGNOSIS — R519 Headache, unspecified: Secondary | ICD-10-CM

## 2023-05-03 ENCOUNTER — Other Ambulatory Visit: Payer: Self-pay | Admitting: Nurse Practitioner

## 2023-05-03 DIAGNOSIS — Z1212 Encounter for screening for malignant neoplasm of rectum: Secondary | ICD-10-CM

## 2023-05-03 DIAGNOSIS — Z1211 Encounter for screening for malignant neoplasm of colon: Secondary | ICD-10-CM

## 2023-05-15 DIAGNOSIS — E890 Postprocedural hypothyroidism: Secondary | ICD-10-CM | POA: Diagnosis not present

## 2023-05-16 DIAGNOSIS — Z1211 Encounter for screening for malignant neoplasm of colon: Secondary | ICD-10-CM | POA: Diagnosis not present

## 2023-05-16 DIAGNOSIS — Z1212 Encounter for screening for malignant neoplasm of rectum: Secondary | ICD-10-CM | POA: Diagnosis not present

## 2023-05-22 LAB — COLOGUARD: COLOGUARD: NEGATIVE

## 2023-06-05 ENCOUNTER — Other Ambulatory Visit: Payer: Self-pay | Admitting: Family Medicine

## 2023-06-05 DIAGNOSIS — E118 Type 2 diabetes mellitus with unspecified complications: Secondary | ICD-10-CM

## 2023-07-03 ENCOUNTER — Other Ambulatory Visit: Payer: Self-pay | Admitting: Family Medicine

## 2023-07-03 DIAGNOSIS — R519 Headache, unspecified: Secondary | ICD-10-CM

## 2023-07-22 ENCOUNTER — Other Ambulatory Visit: Payer: Self-pay | Admitting: Family Medicine

## 2023-07-22 ENCOUNTER — Other Ambulatory Visit: Payer: Self-pay | Admitting: Nurse Practitioner

## 2023-07-22 DIAGNOSIS — R519 Headache, unspecified: Secondary | ICD-10-CM

## 2023-07-22 DIAGNOSIS — I10 Essential (primary) hypertension: Secondary | ICD-10-CM

## 2023-08-02 ENCOUNTER — Other Ambulatory Visit: Payer: Self-pay

## 2023-09-02 ENCOUNTER — Encounter: Payer: Self-pay | Admitting: Nurse Practitioner

## 2023-09-02 ENCOUNTER — Ambulatory Visit: Payer: 59 | Attending: Nurse Practitioner | Admitting: Nurse Practitioner

## 2023-09-02 VITALS — BP 136/79 | HR 60 | Resp 19 | Ht 65.0 in | Wt 315.2 lb

## 2023-09-02 DIAGNOSIS — F5104 Psychophysiologic insomnia: Secondary | ICD-10-CM

## 2023-09-02 DIAGNOSIS — R011 Cardiac murmur, unspecified: Secondary | ICD-10-CM

## 2023-09-02 DIAGNOSIS — K219 Gastro-esophageal reflux disease without esophagitis: Secondary | ICD-10-CM

## 2023-09-02 DIAGNOSIS — E119 Type 2 diabetes mellitus without complications: Secondary | ICD-10-CM | POA: Diagnosis not present

## 2023-09-02 DIAGNOSIS — E890 Postprocedural hypothyroidism: Secondary | ICD-10-CM

## 2023-09-02 DIAGNOSIS — M170 Bilateral primary osteoarthritis of knee: Secondary | ICD-10-CM | POA: Diagnosis not present

## 2023-09-02 DIAGNOSIS — R519 Headache, unspecified: Secondary | ICD-10-CM | POA: Diagnosis not present

## 2023-09-02 DIAGNOSIS — F418 Other specified anxiety disorders: Secondary | ICD-10-CM

## 2023-09-02 DIAGNOSIS — Z6841 Body Mass Index (BMI) 40.0 and over, adult: Secondary | ICD-10-CM

## 2023-09-02 DIAGNOSIS — Z7984 Long term (current) use of oral hypoglycemic drugs: Secondary | ICD-10-CM | POA: Diagnosis not present

## 2023-09-02 DIAGNOSIS — R0789 Other chest pain: Secondary | ICD-10-CM | POA: Diagnosis not present

## 2023-09-02 MED ORDER — AMITRIPTYLINE HCL 10 MG PO TABS: 10.0000 mg | ORAL_TABLET | Freq: Every day | ORAL | 1 refills | Status: DC

## 2023-09-02 MED ORDER — OZEMPIC (0.25 OR 0.5 MG/DOSE) 2 MG/3ML ~~LOC~~ SOPN
0.2500 mg | PEN_INJECTOR | SUBCUTANEOUS | 1 refills | Status: DC
Start: 2023-09-02 — End: 2023-12-02

## 2023-09-02 MED ORDER — OMEPRAZOLE 40 MG PO CPDR: 40.0000 mg | DELAYED_RELEASE_CAPSULE | Freq: Every day | ORAL | 2 refills | Status: DC

## 2023-09-02 NOTE — Progress Notes (Unsigned)
Assessment & Plan:  Autumn "Resa" was seen today for knee pain and gastroesophageal reflux.  Diagnoses and all orders for this visit:  Primary osteoarthritis of both knees -     Ambulatory referral to Orthopedic Surgery  Occipital headache -     amitriptyline (ELAVIL) 10 MG tablet; Take 1 tablet (10 mg total) by mouth at bedtime.  Postoperative hypothyroidism  Depression with anxiety  Chronic insomnia  Diabetes mellitus treated with oral medication (HCC) -     Hemoglobin A1c -     Semaglutide,0.25 or 0.5MG /DOS, (OZEMPIC, 0.25 OR 0.5 MG/DOSE,) 2 MG/3ML SOPN; Inject 0.25 mg into the skin once a week. -     CMP14+EGFR -     Ambulatory referral to Ophthalmology -     Microalbumin / creatinine urine ratio  Gastroesophageal reflux disease without esophagitis -     omeprazole (PRILOSEC) 40 MG capsule; Take 1 capsule (40 mg total) by mouth daily.  Atypical chest pain -     Ambulatory referral to Cardiology  Heart murmur -     Ambulatory referral to Cardiology  Morbid obesity with BMI of 50.0-59.9, adult (HCC) -     Semaglutide,0.25 or 0.5MG /DOS, (OZEMPIC, 0.25 OR 0.5 MG/DOSE,) 2 MG/3ML SOPN; Inject 0.25 mg into the skin once a week.    Patient has been counseled on age-appropriate routine health concerns for screening and prevention. These are reviewed and up-to-date. Referrals have been placed accordingly. Immunizations are up-to-date or declined.    Subjective:   Chief Complaint  Patient presents with   Knee Pain   Gastroesophageal Reflux    Autumn Carson 68 y.o. female presents to office today for follow up to HTN   has a past medical history of Anemia, Anxiety, Arthritis of both knees, Back pain, Back pain, Bilateral swelling of feet, Cancer (HCC), Carpal tunnel syndrome, Chest pain, Chronic anemia, Congestive heart failure (CHF) (HCC), Constipation, Depression (1998), Essential hypertension, Fatty liver, GERD (gastroesophageal reflux disease), Glaucoma,  High cholesterol, History of thyroid cancer (2011), Hypertension, Hypothyroidism, Joint pain, Lumbar spinal stenosis, Obesity, Osteoarthritis, Palpitations, PONV (postoperative nausea and vomiting), Rheumatoid arthritis (HCC), Seasonal allergies, Swallowing difficulty, and Type 2 diabetes mellitus with diabetic neuropathy (HCC).   Dizziness.   Atypical chest pain. Feels like cardiology.epigastric pain at night   HTN  BP Readings from Last 3 Encounters:  09/02/23 (!) 157/70  12/25/22 (!) 150/68  12/17/22 (!) 154/69     Review of Systems  Constitutional:  Positive for malaise/fatigue. Negative for fever and weight loss.  HENT: Negative.  Negative for nosebleeds.   Eyes: Negative.  Negative for blurred vision, double vision and photophobia.  Respiratory: Negative.  Negative for cough and shortness of breath.   Cardiovascular: Negative.  Negative for chest pain, palpitations and leg swelling.  Gastrointestinal:  Negative for heartburn, nausea and vomiting.       Epigastric pain  Musculoskeletal:  Positive for joint pain. Negative for myalgias.  Neurological: Negative.  Negative for dizziness, focal weakness, seizures and headaches.  Psychiatric/Behavioral: Negative.  Negative for suicidal ideas.     Past Medical History:  Diagnosis Date   Anemia    Anxiety    Arthritis of both knees    Back pain    Back pain    Bilateral swelling of feet    Cancer (HCC)    Thyroid   Carpal tunnel syndrome    both wrists   Chest pain    Chronic anemia    Congestive heart failure (CHF) (  HCC)    Constipation    Depression 1998   Essential hypertension    Fatty liver    GERD (gastroesophageal reflux disease)    Glaucoma    High cholesterol    History of thyroid cancer 2011   Papillary thyroid cancer s/p radiation   Hypertension    Hypothyroidism    Joint pain    Lumbar spinal stenosis    Obesity    Osteoarthritis    Palpitations    PONV (postoperative nausea and vomiting)     Rheumatoid arthritis (HCC)    Seasonal allergies    Swallowing difficulty    Type 2 diabetes mellitus with diabetic neuropathy (HCC)     Past Surgical History:  Procedure Laterality Date   BACK SURGERY     BIOPSY N/A 01/16/2013   Procedure: BIOPSY;  Surgeon: West Bali, MD;  [facility];  Service: Endoscopy;  Laterality: N/A;  SMALL BOWEL ULCERS   BRAVO PH STUDY N/A 11/22/2015   Procedure: BRAVO PH STUDY;  Surgeon: West Bali, MD;  [facility];  Service: Endoscopy;  Laterality: N/A;   BREAST BIOPSY Left 10/23/2022   [place] LT BREAST BX W LOC DEV EA ADD LESION IMG BX SPEC US GUIDE 10/23/2022 [facility]   BREAST BIOPSY Left 10/23/2022   [place] LT BREAST BX W LOC DEV 1ST LESION IMG BX SPEC US GUIDE 10/23/2022 [facility]   BREAST BIOPSY Left 12/21/2022   [place] LT RADIOACTIVE SEED LOC 12/21/2022 [facility]   BREAST LUMPECTOMY WITH RADIOACTIVE SEED LOCALIZATION Left 12/25/2022   Procedure: LEFT BREAST LUMPECTOMY WITH RADIOACTIVE SEED LOCALIZATION;  Surgeon: Manus Rudd, MD;  Location: [facility];  Service: General;  Laterality: Left;   CARPAL TUNNEL RELEASE     4 total, bilaterally   CESAREAN SECTION     CHOLECYSTECTOMY  1990's   COLONOSCOPY WITH ESOPHAGOGASTRODUODENOSCOPY (EGD) N/A 12/08/2012   SLF: 1. Normal mucosa in the terminal ileum 2. mild diverticulosis throughout the entire examined colon. 3. rectal bleeding due to moderate sized internal hemorrhoids.    ELBOW SURGERY Bilateral    ENTEROSCOPY N/A 01/16/2013   Procedure: ENTEROSCOPY;  Surgeon: West Bali, MD;  [facility];  Service: Endoscopy;  Laterality: N/A;  2:00   ESOPHAGOGASTRODUODENOSCOPY N/A 11/22/2015   Procedure: ESOPHAGOGASTRODUODENOSCOPY (EGD);  Surgeon: West Bali, MD;  [facility];  Service: Endoscopy;  Laterality: N/A;  130 - moved to 2:00 - office to notify pt   FOOT SURGERY Left 1999   bone spur   GIVENS CAPSULE STUDY N/A 12/31/2012   Procedure:  GIVENS CAPSULE STUDY;  Surgeon: West Bali, MD;  [facility];  Service: Endoscopy;  Laterality: N/A;  7:30   ROTATOR CUFF REPAIR Bilateral    bilaterally   SAVORY DILATION N/A 11/22/2015   Procedure: SAVORY DILATION;  Surgeon: West Bali, MD;  [facility];  Service: Endoscopy;  Laterality: N/A;   SPINE SURGERY     Lumbar Fusion- Dr. Dutch Quint   TONSILECTOMY, ADENOIDECTOMY, BILATERAL MYRINGOTOMY AND TUBES  1987   TONSILLECTOMY  1987   TOTAL THYROIDECTOMY  2011   TUBAL LIGATION      Family History  Problem Relation Age of Onset   Stroke Mother    Hyperlipidemia Mother    Hypertension Mother    Heart disease Mother    Sudden death Mother    Alcoholism Mother    Cancer Father    Heart disease Father  Hyperlipidemia Father    Lung cancer Father        Age 71   Breast cancer Sister    Heart attack Daughter 93   Hypertension Daughter    Breast cancer Cousin    Colon cancer Brother        Age 32   Other Brother        Agent orange, age 4, cancer    Social History Reviewed with no changes to be made today.   Outpatient Medications Prior to Visit  Medication Sig Dispense Refill   amLODipine (NORVASC) 10 MG tablet TAKE 1 TABLET BY MOUTH DAILY 100 tablet 2   aspirin 81 MG tablet Take 81 mg by mouth every other day.     atorvastatin (LIPITOR) 40 MG tablet Take 1 tablet (40 mg total) by mouth daily. 90 tablet 3   Blood Glucose Monitoring Suppl (TRUE METRIX AIR GLUCOSE METER) w/Device KIT USE AS DIRECTED 1 kit 0   celecoxib (CELEBREX) 100 MG capsule TAKE 1 CAPSULE(100 MG) BY MOUTH TWICE DAILY AS NEEDED FOR MODERATE PAIN 60 capsule 2   chlorthalidone (HYGROTON) 25 MG tablet TAKE 1 TABLET BY MOUTH DAILY 100 tablet 2   ciclopirox (PENLAC) 8 % solution Apply one coat to toenail qd for 48 weeks.  Remove weekly with polish remover. (Patient taking differently: Apply 1 Application topically 3 (three) times a week.   Remove weekly with polish remover.) 6.6 mL 11    diclofenac Sodium (VOLTAREN) 1 % GEL Apply 1 Application topically 2 (two) times daily.     hydrALAZINE (APRESOLINE) 10 MG tablet Take 1 tablet (10 mg total) by mouth 3 (three) times daily. 270 tablet 3   levothyroxine (SYNTHROID) 175 MCG tablet TAKE 1 TABLET BY MOUTH DAILY  BEFORE BREAKFAST 100 tablet 2   metFORMIN (GLUCOPHAGE) 1000 MG tablet TAKE 1 TABLET BY MOUTH TWICE  DAILY WITH MEALS 60 tablet 0   nebivolol (BYSTOLIC) 10 MG tablet TAKE 1 TABLET BY MOUTH DAILY 100 tablet 2   TRUE METRIX BLOOD GLUCOSE TEST test strip CHECK FINGER STICK BLOOD SUGAR TWICE DAILY AS DIRECTED 200 strip 3   zolpidem (AMBIEN) 5 MG tablet TAKE 1 TABLET EVERY NIGHT AT BEDTIME AS NEEDED FOR SLEEP 30 tablet 3   FLUoxetine (PROZAC) 40 MG capsule Take 40 mg by mouth daily.     omeprazole (PRILOSEC) 40 MG capsule TAKE 1 CAPSULE BY MOUTH DAILY 100 capsule 2   amitriptyline (ELAVIL) 10 MG tablet Take 1 tablet (10 mg total) by mouth at bedtime. (Patient not taking: Reported on 09/02/2023) 90 tablet 0   No facility-administered medications prior to visit.    Allergies  Allergen Reactions   Lisinopril Anaphylaxis    angioedema       Objective:    BP (!) 157/70 (BP Location: Left Arm, Patient Position: Bed low/side rails up;Sitting)   Pulse 60   Resp 19   Ht 5\' 5"  (1.651 m)   Wt (!) 315 lb 3.2 oz (143 kg)   LMP 09/07/2011   SpO2 97%   BMI 52.45 kg/m  Wt Readings from Last 3 Encounters:  09/02/23 (!) 315 lb 3.2 oz (143 kg)  02/13/23 (!) 303 lb (137.4 kg)  12/25/22 (!) 303 lb (137.4 kg)    Physical Exam Vitals and nursing note reviewed.  Constitutional:      Appearance: She is well-developed.  HENT:     Head: Normocephalic and atraumatic.  Cardiovascular:     Rate and Rhythm: Normal rate and  regular rhythm.     Heart sounds: Murmur heard.     No friction rub. No gallop.  Pulmonary:     Effort: Pulmonary effort is normal. No tachypnea or respiratory distress.     Breath sounds: Normal breath sounds.  No decreased breath sounds, wheezing, rhonchi or rales.  Chest:     Chest wall: No tenderness.  Abdominal:     General: Bowel sounds are normal.     Palpations: Abdomen is soft.  Musculoskeletal:        General: Normal range of motion.     Cervical back: Normal range of motion.  Skin:    General: Skin is warm and dry.  Neurological:     Mental Status: She is alert and oriented to person, place, and time.     Coordination: Coordination normal.  Psychiatric:        Behavior: Behavior normal. Behavior is cooperative.        Thought Content: Thought content normal.        Judgment: Judgment normal.          Patient has been counseled extensively about nutrition and exercise as well as the importance of adherence with medications and regular follow-up. The patient was given clear instructions to go to ER or return to medical center if symptoms don't improve, worsen or new problems develop. The patient verbalized understanding.   Follow-up: Return in about 3 weeks (around 09/23/2023) for BP recheck with nurse. Bring device. See me in 3 months.   Claiborne Rigg, FNP-BC [facility] [place], [place] [phone]   09/02/2023, 2:08 PM

## 2023-09-03 ENCOUNTER — Encounter: Payer: Self-pay | Admitting: Nurse Practitioner

## 2023-09-03 LAB — CMP14+EGFR
ALT: 14 [IU]/L (ref 0–32)
AST: 15 [IU]/L (ref 0–40)
Albumin: 4.4 g/dL (ref 3.9–4.9)
Alkaline Phosphatase: 85 [IU]/L (ref 44–121)
BUN/Creatinine Ratio: 10 — ABNORMAL LOW (ref 12–28)
BUN: 12 mg/dL (ref 8–27)
Bilirubin Total: <0.2 mg/dL (ref 0.0–1.2)
CO2: 24 mmol/L (ref 20–29)
Calcium: 9.2 mg/dL (ref 8.7–10.3)
Chloride: 101 mmol/L (ref 96–106)
Creatinine, Ser: 1.19 mg/dL — ABNORMAL HIGH (ref 0.57–1.00)
Globulin, Total: 2.5 g/dL (ref 1.5–4.5)
Glucose: 114 mg/dL — ABNORMAL HIGH (ref 70–99)
Potassium: 4.1 mmol/L (ref 3.5–5.2)
Sodium: 140 mmol/L (ref 134–144)
Total Protein: 6.9 g/dL (ref 6.0–8.5)
eGFR: 51 mL/min/{1.73_m2} — ABNORMAL LOW (ref >59)

## 2023-09-03 LAB — MICROALBUMIN / CREATININE URINE RATIO
Creatinine, Urine: 201.1 mg/dL
Microalb/Creat Ratio: 75 mg/g{creat} — ABNORMAL HIGH (ref 0–29)
Microalbumin, Urine: 151.4 ug/mL

## 2023-09-03 LAB — HEMOGLOBIN A1C
Est. average glucose Bld gHb Est-mCnc: 169 mg/dL
Hgb A1c MFr Bld: 7.5 % — ABNORMAL HIGH (ref 4.8–5.6)

## 2023-09-05 ENCOUNTER — Other Ambulatory Visit: Payer: Self-pay | Admitting: Family Medicine

## 2023-09-05 DIAGNOSIS — I10 Essential (primary) hypertension: Secondary | ICD-10-CM

## 2023-09-05 NOTE — Telephone Encounter (Signed)
 Requested by interface surescripts. Future visit in 2 months.  Requested Prescriptions  Pending Prescriptions Disp Refills   nebivolol  (BYSTOLIC ) 10 MG tablet [Pharmacy Med Name: Nebivolol  HCl 10 MG Oral Tablet] 100 tablet 0    Sig: TAKE 1 TABLET BY MOUTH DAILY     Cardiovascular: Beta Blockers 3 Failed - 09/05/2023 11:45 AM      Failed - Cr in normal range and within 360 days    Creat  Date Value Ref Range Status  06/06/2020 0.84 0.50 - 0.99 mg/dL Final    Comment:    For patients >32 years of age, the reference limit for Creatinine is approximately 13% higher for people identified as African-American. .    Creatinine, Ser  Date Value Ref Range Status  09/02/2023 1.19 (H) 0.57 - 1.00 mg/dL Final   Creatinine, Urine  Date Value Ref Range Status  06/06/2020 119 20 - 275 mg/dL Final         Failed - Valid encounter within last 6 months    Recent Outpatient Visits           3 days ago Primary osteoarthritis of both knees   [place] [facility] [place]. [facility] [place], [place] W, NP   9 months ago Type 2 diabetes mellitus with diabetic autonomic neuropathy, without long-term current use of insulin  [facility])   [place] [facility] [place]. [facility] Theotis Haze ORN, NP   1 year ago Primary hypertension   [place] [facility] [place] [facility] [place]. [facility] Theotis Haze ORN, NP   1 year ago Essential hypertension   [place] [facility] [place] [facility] [place]. [facility] [place], [place] W, NP   1 year ago Controlled type 2 diabetes mellitus with complication, without long-term current use of insulin  [facility])    [facility] [place]. [facility] Theotis Haze ORN, NP       Future Appointments             In 2 months Theotis Haze ORN, NP [facility] Of . [facility]             Passed - AST in normal range and within 360 days    AST  Date Value Ref Range Status  09/02/2023 15 0 - 40 IU/L Final         Passed - ALT in normal range and within 360 days    ALT  Date Value Ref Range Status  09/02/2023 14 0 - 32 IU/L Final         Passed - Last BP in normal range    BP Readings from Last 1 Encounters:  09/02/23 136/79         Passed - Last Heart Rate in normal range    Pulse Readings from Last 1 Encounters:  09/02/23 60          chlorthalidone  (HYGROTON ) 25 MG tablet [Pharmacy Med Name: Chlorthalidone  25 MG Oral Tablet] 100 tablet 0    Sig: TAKE 1 TABLET BY MOUTH DAILY     Cardiovascular: Diuretics - Thiazide Failed - 09/05/2023 11:45 AM      Failed - Cr in normal range and within 180 days    Creat  Date Value Ref Range Status  06/06/2020 0.84 0.50 -  0.99 mg/dL Final    Comment:    For patients >39 years of age, the reference limit for Creatinine is approximately 13% higher for people identified as African-American. .    Creatinine, Ser  Date Value Ref Range Status  09/02/2023 1.19 (H) 0.57 - 1.00 mg/dL Final   Creatinine, Urine  Date Value Ref Range Status  06/06/2020 119 20 - 275 mg/dL Final         Failed - Valid encounter within last 6 months    Recent Outpatient Visits           3 days ago Primary osteoarthritis of both knees   [place] [facility] [place]. [facility] [place], [place] W, NP   9 months ago Type 2 diabetes mellitus with diabetic autonomic neuropathy, without long-term current use of insulin  [facility])   [place] [facility] [place]. [facility] Theotis Haze ORN, NP   1 year ago Primary hypertension   [place] [facility] [place]. [facility] Theotis Haze ORN, NP   1 year ago Essential hypertension   [place] [facility] [place] [facility] [place]. [facility] [place], [place] W, NP   1 year ago Controlled type 2  diabetes mellitus with complication, without long-term current use of insulin  [facility])   [place] [facility] Of [place]. [facility] Theotis Haze ORN, NP       Future Appointments             In 2 months Theotis Haze ORN, NP [facility] Of [place]. [facility]            Passed - K in normal range and within 180 days    Potassium  Date Value Ref Range Status  09/02/2023 4.1 3.5 - 5.2 mmol/L Final         Passed - Na in normal range and within 180 days    Sodium  Date Value Ref Range Status  09/02/2023 140 134 - 144 mmol/L Final         Passed - Last BP in normal range    BP Readings from Last 1 Encounters:  09/02/23 136/79

## 2023-09-11 ENCOUNTER — Ambulatory Visit: Payer: 59 | Admitting: Nurse Practitioner

## 2023-09-11 ENCOUNTER — Encounter: Payer: Self-pay | Admitting: Nurse Practitioner

## 2023-09-12 ENCOUNTER — Other Ambulatory Visit (INDEPENDENT_AMBULATORY_CARE_PROVIDER_SITE_OTHER): Payer: 59

## 2023-09-12 ENCOUNTER — Ambulatory Visit (INDEPENDENT_AMBULATORY_CARE_PROVIDER_SITE_OTHER): Payer: 59 | Admitting: Physician Assistant

## 2023-09-12 ENCOUNTER — Encounter: Payer: Self-pay | Admitting: Physician Assistant

## 2023-09-12 DIAGNOSIS — M1711 Unilateral primary osteoarthritis, right knee: Secondary | ICD-10-CM

## 2023-09-12 DIAGNOSIS — M170 Bilateral primary osteoarthritis of knee: Secondary | ICD-10-CM | POA: Diagnosis not present

## 2023-09-12 DIAGNOSIS — M1712 Unilateral primary osteoarthritis, left knee: Secondary | ICD-10-CM

## 2023-09-12 MED ORDER — LIDOCAINE HCL 1 % IJ SOLN
2.0000 mL | INTRAMUSCULAR | Status: AC | PRN
Start: 2023-09-12 — End: 2023-09-12
  Administered 2023-09-12: 2 mL

## 2023-09-12 MED ORDER — METHYLPREDNISOLONE ACETATE 40 MG/ML IJ SUSP
40.0000 mg | INTRAMUSCULAR | Status: AC | PRN
Start: 2023-09-12 — End: 2023-09-12
  Administered 2023-09-12: 40 mg via INTRA_ARTICULAR

## 2023-09-12 MED ORDER — BUPIVACAINE HCL 0.25 % IJ SOLN
2.0000 mL | INTRAMUSCULAR | Status: AC | PRN
Start: 2023-09-12 — End: 2023-09-12
  Administered 2023-09-12: 2 mL via INTRA_ARTICULAR

## 2023-09-12 NOTE — Progress Notes (Signed)
Office Visit Note   Patient: Autumn Carson           Date of Birth: 1956-03-20           MRN: [id number] Visit Date: 09/12/2023              Requested by: Claiborne Rigg, NP [address] [place],  [place] [address] PCP: Claiborne Rigg, NP   Assessment & Plan: Visit Diagnoses:  1. Primary osteoarthritis of both knees     Plan: Pleasant 68 year old woman with history of advanced tricompartmental arthritis with varus malalignment.  She has had injections in the past but it was a while ago she does not think it helped very much.  She has not tried viscosupplementation.  She has tried physical therapy in the past with limited success.  She is working with her primary care provider on weight loss.  She is using a cane to ambulate.  I do not think she would be a good candidate for bracing.  Certainly we could try a steroid injection today and then if she wants to can come back in 2 weeks to inject her other knee as she is diabetic.  She is in agreement with this plan.  I told her I was not sure if viscosupplementation would be of any use but certainly we could try this.  Follow-Up Instructions: Return in about 2 weeks (around 09/26/2023).   Orders:  Orders Placed This Encounter  Procedures   XR KNEE 3 VIEW RIGHT   XR KNEE 3 VIEW LEFT   No orders of the defined types were placed in this encounter.     Procedures: Large Joint Inj: R knee on 09/12/2023 10:05 AM Indications: pain and diagnostic evaluation Details: 25 G 1.5 in needle, anteromedial approach  Arthrogram: No  Medications: 40 mg methylPREDNISolone acetate 40 MG/ML; 2 mL lidocaine 1 %; 2 mL bupivacaine 0.25 % Outcome: tolerated well, no immediate complications Procedure, treatment alternatives, risks and benefits explained, specific risks discussed. Consent was given by the patient.       Clinical Data: No additional findings.   Subjective: Chief Complaint  Patient presents with   Right  Knee - Pain   Left Knee - Pain    HPI patient is a 68 year old woman with a long history of bilateral knee pain.  Right is worse than left.  This is gotten progressively worse she complains of popping and pain she struggles to walk winter is worse in the summer.  She had injections previously without much relief.  Has not done any PT.  Her knees feel unstable often causing her to fall.  She does not get any relief with Voltaren she does take Celebrex which offers her little relief  Review of Systems  All other systems reviewed and are negative.    Objective: Vital Signs: LMP 09/07/2011   Physical Exam Constitutional:      Appearance: Normal appearance.  Pulmonary:     Effort: Pulmonary effort is normal.  Skin:    General: Skin is warm and dry.  Neurological:     General: No focal deficit present.     Mental Status: She is alert and oriented to person, place, and time.  Psychiatric:        Mood and Affect: Mood normal.        Behavior: Behavior normal.     Ortho Exam Bilateral knees she does have some varus alignment but no effusion no erythema compartments are  soft and compressible she is neurovascularly intact distally.  Has tenderness over the medial greater than lateral joint line has quite a bit of crepitus and grinding with range of motion right greater than left Specialty Comments:  No specialty comments available.  Imaging: XR KNEE 3 VIEW LEFT Result Date: 09/12/2023 Tricompartmental osteoarthritis with medial compartment bone-on-bone changes periarticular osteophytes and sclerotic changes  XR KNEE 3 VIEW RIGHT Result Date: 09/12/2023 Advanced tricompartmental arthritis with bone-on-bone findings in the medial compartment periarticular osteophytes and sclerotic changes    PMFS History: Patient Active Problem List   Diagnosis Date Noted   Osteoarthritis of knees, bilateral 09/12/2023   Lumbar radiculopathy 05/30/2022   Malignant neoplasm of thyroid gland (HCC)  05/01/2021   History of papillary adenocarcinoma of thyroid 12/13/2020   Postoperative hypothyroidism 12/13/2020   Severe obesity (HCC) 12/13/2020   Arthrodesis status 03/15/2020   Lumbar foraminal stenosis 02/12/2020   Spondylolisthesis 12/10/2019   Status post lumbar spinal fusion 12/10/2019   Chronic insomnia 07/15/2018   Dysphagia, idiopathic 10/27/2015   OA (osteoarthritis) of knee 06/23/2014   Knee pain, right 09/16/2013   Spinal stenosis of lumbar region 07/08/2013   Spinal stenosis, lumbar region, with neurogenic claudication 04/07/2013   Hypothyroidism 02/10/2013   Class 3 obesity 02/10/2013   DDD (degenerative disc disease), lumbar 12/16/2012   FH: colon cancer 11/26/2012   Chronic anemia 11/26/2012   Constipation 11/26/2012   Depression with anxiety 07/29/2012   Diabetic neuropathy (HCC) 06/12/2012   Leg swelling 06/12/2012   GERD 04/12/2009   Type 2 diabetes mellitus (HCC) 01/13/2009   Hyperlipidemia 01/13/2009   Essential hypertension 01/13/2009   Osteoarthritis 01/13/2009   Past Medical History:  Diagnosis Date   Anemia    Anxiety    Arthritis of both knees    Back pain    Back pain    Bilateral swelling of feet    Cancer (HCC)    Thyroid   Carpal tunnel syndrome    both wrists   Chest pain    Chronic anemia    Congestive heart failure (CHF) (HCC)    Constipation    Depression 1998   Essential hypertension    Fatty liver    GERD (gastroesophageal reflux disease)    Glaucoma    High cholesterol    History of thyroid cancer 2011   Papillary thyroid cancer s/p radiation   Hypertension    Hypothyroidism    Joint pain    Lumbar spinal stenosis    Obesity    Osteoarthritis    Palpitations    PONV (postoperative nausea and vomiting)    Rheumatoid arthritis (HCC)    Seasonal allergies    Swallowing difficulty    Type 2 diabetes mellitus with diabetic neuropathy (HCC)     Family History  Problem Relation Age of Onset   Stroke Mother     Hyperlipidemia Mother    Hypertension Mother    Heart disease Mother    Sudden death Mother    Alcoholism Mother    Cancer Father    Heart disease Father    Hyperlipidemia Father    Lung cancer Father        Age 47   Breast cancer Sister    Heart attack Daughter 42   Hypertension Daughter    Breast cancer Cousin    Colon cancer Brother        Age 66   Other Brother        Agent orange, age 15, cancer  Past Surgical History:  Procedure Laterality Date   BACK SURGERY     BIOPSY N/A 01/16/2013   Procedure: BIOPSY;  Surgeon: West Bali, MD;  [facility];  Service: Endoscopy;  Laterality: N/A;  SMALL BOWEL ULCERS   BRAVO PH STUDY N/A 11/22/2015   Procedure: BRAVO PH STUDY;  Surgeon: West Bali, MD;  [facility];  Service: Endoscopy;  Laterality: N/A;   BREAST BIOPSY Left 10/23/2022   [place] LT BREAST BX W LOC DEV EA ADD LESION IMG BX SPEC US GUIDE 10/23/2022 [facility]   BREAST BIOPSY Left 10/23/2022   [place] LT BREAST BX W LOC DEV 1ST LESION IMG BX SPEC US GUIDE 10/23/2022 [facility]   BREAST BIOPSY Left 12/21/2022   [place] LT RADIOACTIVE SEED LOC 12/21/2022 [facility]   BREAST LUMPECTOMY WITH RADIOACTIVE SEED LOCALIZATION Left 12/25/2022   Procedure: LEFT BREAST LUMPECTOMY WITH RADIOACTIVE SEED LOCALIZATION;  Surgeon: Manus Rudd, MD;  [facility];  Service: General;  Laterality: Left;   CARPAL TUNNEL RELEASE     4 total, bilaterally   CESAREAN SECTION     CHOLECYSTECTOMY  1990's   COLONOSCOPY WITH ESOPHAGOGASTRODUODENOSCOPY (EGD) N/A 12/08/2012   SLF: 1. Normal mucosa in the terminal ileum 2. mild diverticulosis throughout the entire examined colon. 3. rectal bleeding due to moderate sized internal hemorrhoids.    ELBOW SURGERY Bilateral    ENTEROSCOPY N/A 01/16/2013   Procedure: ENTEROSCOPY;  Surgeon: West Bali, MD;  [facility];  Service: Endoscopy;  Laterality: N/A;  2:00   ESOPHAGOGASTRODUODENOSCOPY N/A  11/22/2015   Procedure: ESOPHAGOGASTRODUODENOSCOPY (EGD);  Surgeon: West Bali, MD;  [facility];  Service: Endoscopy;  Laterality: N/A;  130 - moved to 2:00 - office to notify pt   FOOT SURGERY Left 1999   bone spur   GIVENS CAPSULE STUDY N/A 12/31/2012   Procedure: GIVENS CAPSULE STUDY;  Surgeon: West Bali, MD;  [facility];  Service: Endoscopy;  Laterality: N/A;  7:30   ROTATOR CUFF REPAIR Bilateral    bilaterally   SAVORY DILATION N/A 11/22/2015   Procedure: SAVORY DILATION;  Surgeon: West Bali, MD;  [facility];  Service: Endoscopy;  Laterality: N/A;   SPINE SURGERY     Lumbar Fusion- Dr. Dutch Quint   TONSILECTOMY, ADENOIDECTOMY, BILATERAL MYRINGOTOMY AND TUBES  1987   TONSILLECTOMY  1987   TOTAL THYROIDECTOMY  2011   TUBAL LIGATION     Social History   Occupational History   Occupation: Disabled    Employer: UNEMPLOYED  Tobacco Use   Smoking status: Never    Passive exposure: Past   Smokeless tobacco: Never  Vaping Use   Vaping status: Never Used  Substance and Sexual Activity   Alcohol use: Yes    Comment: Occasional   Drug use: No   Sexual activity: Not Currently    Birth control/protection: Surgical

## 2023-09-26 ENCOUNTER — Encounter: Payer: Self-pay | Admitting: Physician Assistant

## 2023-09-26 ENCOUNTER — Ambulatory Visit (INDEPENDENT_AMBULATORY_CARE_PROVIDER_SITE_OTHER): Payer: 59 | Admitting: Physician Assistant

## 2023-09-26 DIAGNOSIS — M170 Bilateral primary osteoarthritis of knee: Secondary | ICD-10-CM | POA: Diagnosis not present

## 2023-09-26 DIAGNOSIS — M1712 Unilateral primary osteoarthritis, left knee: Secondary | ICD-10-CM | POA: Diagnosis not present

## 2023-09-26 MED ORDER — LIDOCAINE HCL 1 % IJ SOLN
3.0000 mL | INTRAMUSCULAR | Status: AC | PRN
Start: 2023-09-26 — End: 2023-09-26
  Administered 2023-09-26: 3 mL

## 2023-09-26 MED ORDER — METHYLPREDNISOLONE ACETATE 40 MG/ML IJ SUSP
40.0000 mg | INTRAMUSCULAR | Status: AC | PRN
Start: 2023-09-26 — End: 2023-09-26
  Administered 2023-09-26: 40 mg via INTRA_ARTICULAR

## 2023-09-26 NOTE — Progress Notes (Signed)
 Office Visit Note   Patient: Autumn Carson           Date of Birth: 01/31/1956           MRN: [id number] Visit Date: 09/26/2023              Requested by: Claiborne Rigg, NP [address] [place] [address] [place],  [place] [address] PCP: Claiborne Rigg, NP  Chief Complaint  Patient presents with  . Right Knee - Follow-up  . Left Knee - Follow-up      HPI: Patient is a pleasant 68 year old woman with a history of bilateral tricompartmental varus arthritis advanced stage.  She is diabetic I injected her right knee 2 weeks ago and she did get some relief.  She comes in today for her left knee injection.  No new history.  Assessment & Plan: Visit Diagnoses:  1. Osteoarthritis of both knees, unspecified osteoarthritis type     Plan: Will go forward with injection of her left knee.  She may follow-up as needed.  She is working on weight loss and has recently been taking weight loss medication and is lost 6 pounds in 3 weeks  Follow-Up Instructions: Return if symptoms worsen or fail to improve.   Ortho Exam  Patient is alert, oriented, no adenopathy, well-dressed, normal affect, normal respiratory effort. Examination of her left knee she has no redness no erythema no effusion  Imaging: No results found. No images are attached to the encounter.  Labs: Lab Results  Component Value Date   HGBA1C 7.5 (H) 09/02/2023   HGBA1C 6.2 12/03/2022   HGBA1C 6.5 05/30/2022   ESRSEDRATE 41 (H) 02/07/2021   CRP 12 (H) 02/07/2021   REPTSTATUS 04/09/2010 FINAL 04/07/2010   CULT ESCHERICHIA COLI 04/07/2010   LABORGA ESCHERICHIA COLI 04/07/2010     Lab Results  Component Value Date   ALBUMIN 4.4 09/02/2023   ALBUMIN 4.4 09/24/2022   ALBUMIN 4.3 05/30/2022    No results found for: "MG" Lab Results  Component Value Date   VD25OH 13.9 (L) 09/12/2021   VD25OH 23 (L) 07/25/2010   VD25OH 17 (L) 11/14/2009    No results found for: "PREALBUMIN"    Latest Ref Rng & Units  12/03/2022    9:02 AM 09/12/2021    4:38 PM 02/07/2021    9:08 AM  CBC EXTENDED  WBC 3.4 - 10.8 x10E3/uL 5.3  5.9  5.3   RBC 3.77 - 5.28 x10E6/uL 4.31  4.63  4.25   Hemoglobin 11.1 - 15.9 g/dL 19.1  47.8  29.5   HCT 34.0 - 46.6 % 35.2  36.5  34.2   Platelets 150 - 450 x10E3/uL 261  309  251   NEUT# 1.4 - 7.0 x10E3/uL 2.8  3.2    Lymph# 0.7 - 3.1 x10E3/uL 1.8  2.1       There is no height or weight on file to calculate BMI.  Orders:  No orders of the defined types were placed in this encounter.  No orders of the defined types were placed in this encounter.    Procedures: Large Joint Inj: L knee on 09/26/2023 9:22 AM Indications: pain and diagnostic evaluation Details: 25 G 1.5 in needle, anteromedial approach  Arthrogram: No  Medications: 40 mg methylPREDNISolone acetate 40 MG/ML; 3 mL lidocaine 1 % Outcome: tolerated well, no immediate complications Procedure, treatment alternatives, risks and benefits explained, specific risks discussed. Consent was given by the patient.    Clinical Data: No additional  findings.  ROS:  All other systems negative, except as noted in the HPI. Review of Systems  Objective: Vital Signs: LMP 09/07/2011   Specialty Comments:  No specialty comments available.  PMFS History: Patient Active Problem List   Diagnosis Date Noted  . Osteoarthritis of knees, bilateral 09/12/2023  . Lumbar radiculopathy 05/30/2022  . Malignant neoplasm of thyroid gland (HCC) 05/01/2021  . History of papillary adenocarcinoma of thyroid 12/13/2020  . Postoperative hypothyroidism 12/13/2020  . Severe obesity (HCC) 12/13/2020  . Arthrodesis status 03/15/2020  . Lumbar foraminal stenosis 02/12/2020  . Spondylolisthesis 12/10/2019  . Status post lumbar spinal fusion 12/10/2019  . Chronic insomnia 07/15/2018  . Dysphagia, idiopathic 10/27/2015  . OA (osteoarthritis) of knee 06/23/2014  . Knee pain, right 09/16/2013  . Spinal stenosis of lumbar region  07/08/2013  . Spinal stenosis, lumbar region, with neurogenic claudication 04/07/2013  . Hypothyroidism 02/10/2013  . Class 3 obesity 02/10/2013  . DDD (degenerative disc disease), lumbar 12/16/2012  . FH: colon cancer 11/26/2012  . Chronic anemia 11/26/2012  . Constipation 11/26/2012  . Depression with anxiety 07/29/2012  . Diabetic neuropathy (HCC) 06/12/2012  . Leg swelling 06/12/2012  . GERD 04/12/2009  . Type 2 diabetes mellitus (HCC) 01/13/2009  . Hyperlipidemia 01/13/2009  . Essential hypertension 01/13/2009  . Osteoarthritis 01/13/2009   Past Medical History:  Diagnosis Date  . Anemia   . Anxiety   . Arthritis of both knees   . Back pain   . Back pain   . Bilateral swelling of feet   . Cancer (HCC)    Thyroid  . Carpal tunnel syndrome    both wrists  . Chest pain   . Chronic anemia   . Congestive heart failure (CHF) (HCC)   . Constipation   . Depression 1998  . Essential hypertension   . Fatty liver   . GERD (gastroesophageal reflux disease)   . Glaucoma   . High cholesterol   . History of thyroid cancer 2011   Papillary thyroid cancer s/p radiation  . Hypertension   . Hypothyroidism   . Joint pain   . Lumbar spinal stenosis   . Obesity   . Osteoarthritis   . Palpitations   . PONV (postoperative nausea and vomiting)   . Rheumatoid arthritis (HCC)   . Seasonal allergies   . Swallowing difficulty   . Type 2 diabetes mellitus with diabetic neuropathy (HCC)     Family History  Problem Relation Age of Onset  . Stroke Mother   . Hyperlipidemia Mother   . Hypertension Mother   . Heart disease Mother   . Sudden death Mother   . Alcoholism Mother   . Cancer Father   . Heart disease Father   . Hyperlipidemia Father   . Lung cancer Father        Age 29  . Breast cancer Sister   . Heart attack Daughter 78  . Hypertension Daughter   . Breast cancer Cousin   . Colon cancer Brother        Age 31  . Other Brother        Agent orange, age 48, cancer     Past Surgical History:  Procedure Laterality Date  . BACK SURGERY    . BIOPSY N/A 01/16/2013   Procedure: BIOPSY;  Surgeon: West Bali, MD;  [facility];  Service: Endoscopy;  Laterality: N/A;  SMALL BOWEL ULCERS  . BRAVO PH STUDY N/A 11/22/2015   Procedure: [facility]  STUDY;  Surgeon: West Bali, MD;  [facility];  Service: Endoscopy;  Laterality: N/A;  . BREAST BIOPSY Left 10/23/2022   [place] LT BREAST BX W LOC DEV EA ADD LESION IMG BX SPEC US GUIDE 10/23/2022 [facility]  . BREAST BIOPSY Left 10/23/2022   [place] LT BREAST BX W LOC DEV 1ST LESION IMG BX SPEC US GUIDE 10/23/2022 [facility]  . BREAST BIOPSY Left 12/21/2022   [place] LT RADIOACTIVE SEED LOC 12/21/2022 [facility]  . BREAST LUMPECTOMY WITH RADIOACTIVE SEED LOCALIZATION Left 12/25/2022   Procedure: LEFT BREAST LUMPECTOMY WITH RADIOACTIVE SEED LOCALIZATION;  Surgeon: Manus Rudd, MD;  Location[facility];  Service: General;  Laterality: Left;  . CARPAL TUNNEL RELEASE     4 total, bilaterally  . CESAREAN SECTION    . CHOLECYSTECTOMY  1990's  . COLONOSCOPY WITH ESOPHAGOGASTRODUODENOSCOPY (EGD) N/A 12/08/2012   SLF: 1. Normal mucosa in the terminal ileum 2. mild diverticulosis throughout the entire examined colon. 3. rectal bleeding due to moderate sized internal hemorrhoids.   . ELBOW SURGERY Bilateral   . ENTEROSCOPY N/A 01/16/2013   Procedure: ENTEROSCOPY;  Surgeon: West Bali, MD;  [facility];  Service: Endoscopy;  Laterality: N/A;  2:00  . ESOPHAGOGASTRODUODENOSCOPY N/A 11/22/2015   Procedure: ESOPHAGOGASTRODUODENOSCOPY (EGD);  Surgeon: West Bali, MD;  [facility];  Service: Endoscopy;  Laterality: N/A;  130 - moved to 2:00 - office to notify pt  . FOOT SURGERY Left 1999   bone spur  . GIVENS CAPSULE STUDY N/A 12/31/2012   Procedure: GIVENS CAPSULE STUDY;  Surgeon: West Bali, MD;  [facility];  Service: Endoscopy;  Laterality: N/A;  7:30   . ROTATOR CUFF REPAIR Bilateral    bilaterally  . SAVORY DILATION N/A 11/22/2015   Procedure: SAVORY DILATION;  Surgeon: West Bali, MD;  [facility];  Service: Endoscopy;  Laterality: N/A;  . SPINE SURGERY     Lumbar Fusion- Dr. Dutch Quint  . TONSILECTOMY, ADENOIDECTOMY, BILATERAL MYRINGOTOMY AND TUBES  1987  . TONSILLECTOMY  1987  . TOTAL THYROIDECTOMY  2011  . TUBAL LIGATION     Social History   Occupational History  . Occupation: Disabled    Employer: UNEMPLOYED  Tobacco Use  . Smoking status: Never    Passive exposure: Past  . Smokeless tobacco: Never  Vaping Use  . Vaping status: Never Used  Substance and Sexual Activity  . Alcohol use: Yes    Comment: Occasional  . Drug use: No  . Sexual activity: Not Currently    Birth control/protection: Surgical

## 2023-10-17 DIAGNOSIS — E119 Type 2 diabetes mellitus without complications: Secondary | ICD-10-CM | POA: Diagnosis not present

## 2023-11-07 ENCOUNTER — Encounter: Payer: Self-pay | Admitting: Nurse Practitioner

## 2023-11-07 NOTE — Telephone Encounter (Signed)
 Noted.

## 2023-11-13 ENCOUNTER — Other Ambulatory Visit: Payer: Self-pay | Admitting: Nurse Practitioner

## 2023-11-13 DIAGNOSIS — I10 Essential (primary) hypertension: Secondary | ICD-10-CM

## 2023-11-14 NOTE — Telephone Encounter (Signed)
 Requested Prescriptions  Pending Prescriptions Disp Refills   chlorthalidone (HYGROTON) 25 MG tablet [Pharmacy Med Name: Chlorthalidone 25 MG Oral Tablet] 100 tablet 1    Sig: TAKE 1 TABLET BY MOUTH DAILY     Cardiovascular: Diuretics - Thiazide Failed - 11/14/2023  4:43 PM      Failed - Cr in normal range and within 180 days    Creat  Date Value Ref Range Status  06/06/2020 0.84 0.50 - 0.99 mg/dL Final    Comment:    For patients >66 years of age, the reference limit for Creatinine is approximately 13% higher for people identified as African-American. .    Creatinine, Ser  Date Value Ref Range Status  09/02/2023 1.19 (H) 0.57 - 1.00 mg/dL Final   Creatinine, Urine  Date Value Ref Range Status  06/06/2020 119 20 - 275 mg/dL Final         Failed - Last BP in normal range    BP Readings from Last 1 Encounters:  09/08/23 (!) 164/62         Failed - Valid encounter within last 6 months    Recent Outpatient Visits           2 months ago Primary osteoarthritis of both knees   [place] [facility] [place] [facility] Dept Of [place]. [facility] Collins Dean, NP   11 months ago Type 2 diabetes mellitus with diabetic autonomic neuropathy, without long-term current use of insulin (HCC)   [place] [facility] - [facility] Dept Of [place]. [facility] Collins Dean, NP   1 year ago Primary hypertension   [place] [facility] [place] - A Dept Of [place]. [facility] Collins Dean, NP   1 year ago Essential hypertension   [place] [facility] [place] - A Dept Of [place]. [facility] [place], [place] W, NP   1 year ago Controlled type 2 diabetes mellitus with complication, without long-term current use of insulin (HCC)    [facility] - A Dept Of . [facility] Collins Dean, NP       Future Appointments             In 2 weeks Collins Dean, NP [facility] - A Dept  Of [place]. [facility]   In 1 month [place], Caretha Chapel, MD [facility]            Passed - K in normal range and within 180 days    Potassium  Date Value Ref Range Status  09/02/2023 4.1 3.5 - 5.2 mmol/L Final         Passed - Na in normal range and within 180 days    Sodium  Date Value Ref Range Status  09/02/2023 140 134 - 144 mmol/L Final          nebivolol (BYSTOLIC) 10 MG tablet [Pharmacy Med Name: Nebivolol HCl 10 MG Oral Tablet] 100 tablet 1    Sig: TAKE 1 TABLET BY MOUTH DAILY     Cardiovascular: Beta Blockers 3 Failed - 11/14/2023  4:43 PM      Failed - Cr in normal range and within 360 days    Creat  Date Value Ref Range Status  06/06/2020 0.84 0.50 - 0.99 mg/dL Final    Comment:    For patients >69 years of age, the reference  limit for Creatinine is approximately 13% higher for people identified as African-American. .    Creatinine, Ser  Date Value Ref Range Status  09/02/2023 1.19 (H) 0.57 - 1.00 mg/dL Final   Creatinine, Urine  Date Value Ref Range Status  06/06/2020 119 20 - 275 mg/dL Final         Failed - Last BP in normal range    BP Readings from Last 1 Encounters:  09/08/23 (!) 164/62         Failed - Valid encounter within last 6 months    Recent Outpatient Visits           2 months ago Primary osteoarthritis of both knees   [place] [facility] [place] Dept Of [place]. [facility] Collins Dean, NP   11 months ago Type 2 diabetes mellitus with diabetic autonomic neuropathy, without long-term current use of insulin (HCC)   [place] [facility] - A Dept Of [place]. [facility] Collins Dean, NP   1 year ago Primary hypertension   [place] [facility] [place] - A Dept Of [place]. [facility] Collins Dean, NP   1 year ago Essential hypertension   [place] [facility] [place] - A Dept Of [place]. [facility]  [place], [place] W, NP   1 year ago Controlled type 2 diabetes mellitus with complication, without long-term current use of insulin (HCC)   [place] [facility] - A Dept Of [place]. [facility] Collins Dean, NP       Future Appointments             In 2 weeks Collins Dean, NP [facility] - A Dept Of [place]. [facility]   In 1 month [place], Caretha Chapel, MD [facility] at [facility], [facility]            Passed - AST in normal range and within 360 days    AST  Date Value Ref Range Status  09/02/2023 15 0 - 40 IU/L Final         Passed - ALT in normal range and within 360 days    ALT  Date Value Ref Range Status  09/02/2023 14 0 - 32 IU/L Final         Passed - Last Heart Rate in normal range    Pulse Readings from Last 1 Encounters:  09/08/23 73

## 2023-11-20 DIAGNOSIS — E890 Postprocedural hypothyroidism: Secondary | ICD-10-CM | POA: Diagnosis not present

## 2023-11-20 DIAGNOSIS — Z85850 Personal history of malignant neoplasm of thyroid: Secondary | ICD-10-CM | POA: Diagnosis not present

## 2023-11-27 DIAGNOSIS — Z85850 Personal history of malignant neoplasm of thyroid: Secondary | ICD-10-CM | POA: Diagnosis not present

## 2023-11-27 DIAGNOSIS — E890 Postprocedural hypothyroidism: Secondary | ICD-10-CM | POA: Diagnosis not present

## 2023-12-02 ENCOUNTER — Encounter: Payer: Self-pay | Admitting: Nurse Practitioner

## 2023-12-02 ENCOUNTER — Ambulatory Visit: Payer: 59 | Attending: Nurse Practitioner | Admitting: Nurse Practitioner

## 2023-12-02 VITALS — BP 127/69 | HR 69 | Resp 19 | Ht 65.0 in | Wt 295.2 lb

## 2023-12-02 DIAGNOSIS — Z7985 Long-term (current) use of injectable non-insulin antidiabetic drugs: Secondary | ICD-10-CM | POA: Diagnosis not present

## 2023-12-02 DIAGNOSIS — E1143 Type 2 diabetes mellitus with diabetic autonomic (poly)neuropathy: Secondary | ICD-10-CM | POA: Diagnosis not present

## 2023-12-02 DIAGNOSIS — Z6841 Body Mass Index (BMI) 40.0 and over, adult: Secondary | ICD-10-CM

## 2023-12-02 DIAGNOSIS — E119 Type 2 diabetes mellitus without complications: Secondary | ICD-10-CM | POA: Diagnosis not present

## 2023-12-02 DIAGNOSIS — E7800 Pure hypercholesterolemia, unspecified: Secondary | ICD-10-CM

## 2023-12-02 LAB — POCT GLYCOSYLATED HEMOGLOBIN (HGB A1C): HbA1c, POC (controlled diabetic range): 6.9 % (ref 0.0–7.0)

## 2023-12-02 MED ORDER — OZEMPIC (0.25 OR 0.5 MG/DOSE) 2 MG/3ML ~~LOC~~ SOPN
0.5000 mg | PEN_INJECTOR | SUBCUTANEOUS | 1 refills | Status: AC
Start: 2023-12-02 — End: ?

## 2023-12-02 NOTE — Progress Notes (Signed)
 Assessment & Plan:  Autumn "Resa" was seen today for diabetes.  Diagnoses and all orders for this visit:  Diabetes mellitus treated with injections of non-insulin  medication  Dose change with semaglutide .  Increase from 0.25 to 0.5 mg -     POCT glycosylated hemoglobin (Hb A1C) -     Semaglutide ,0.25 or 0.5MG /DOS, (OZEMPIC , 0.25 OR 0.5 MG/DOSE,) 2 MG/3ML SOPN; Inject 0.5 mg into the skin once a week. -     CMP14+EGFR  Morbid obesity with BMI of 50.0-59.9, adult (HCC) -     Semaglutide ,0.25 or 0.5MG /DOS, (OZEMPIC , 0.25 OR 0.5 MG/DOSE,) 2 MG/3ML SOPN; Inject 0.5 mg into the skin once a week.  Hypercholesterolemia -     Lipid panel    Patient has been counseled on age-appropriate routine health concerns for screening and prevention. These are reviewed and up-to-date. Referrals have been placed accordingly. Immunizations are up-to-date or declined.    Subjective:   Chief Complaint  Patient presents with   Diabetes    Autumn Carson 68 y.o. female presents to office today for HTN and DM  She has a past medical history of Anxiety, primary osteo arthritis of both knees (followed by Ortho), Back pain, THYROID  Cancer with post surgical hypothyroidism (followed by Endocrinology Dr. Kathyanne Parkers), Chronic anemia, Depression (1998), Essential hypertension, GERD, History of thyroid  cancer (2011), Hypothyroidism, Lumbar spinal stenosis, MORBID Obesity, PONV, Seasonal allergies, and Type 2 diabetes mellitus with diabetic neuropathy.     Despite taking Ambien  she has been having trouble sleeping.  Her levothyroxine  was decreased from 200-175 by her endocrinologist due to elevated TSH.  She states Dr. Kathyanne Parkers sent her worsening insomnia could be due to to her levels being elevated.   HTN Blood pressure is well controlled. She is currently prescribed bystolic  10 mg daily, hydraalzine 10 mg TID, chlorthalidone  25 mg daily and amlodipine  10 mg daily.  BP Readings from Last 3 Encounters:  12/02/23  127/69  09/08/23 (!) 164/62  09/02/23 136/79    DM  A1c down to 6.9 today from 7.5.  She is currently administering semaglutide  0.25 mg weekly however she has been out for the past several weeks.  She is also taking metformin  1000 mg twice daily. Lab Results  Component Value Date   HGBA1C 6.9 12/02/2023    Review of Systems  Constitutional:  Negative for fever, malaise/fatigue and weight loss.  HENT: Negative.  Negative for nosebleeds.   Eyes: Negative.  Negative for blurred vision, double vision and photophobia.  Respiratory: Negative.  Negative for cough and shortness of breath.   Cardiovascular: Negative.  Negative for chest pain, palpitations and leg swelling.  Gastrointestinal: Negative.  Negative for heartburn, nausea and vomiting.  Musculoskeletal: Negative.  Negative for myalgias.  Neurological: Negative.  Negative for dizziness, focal weakness, seizures and headaches.  Psychiatric/Behavioral: Negative.  Negative for suicidal ideas.     Past Medical History:  Diagnosis Date   Anemia    Anxiety    Arthritis of both knees    Back pain    Back pain    Bilateral swelling of feet    Cancer (HCC)    Thyroid    Carpal tunnel syndrome    both wrists   Chest pain    Chronic anemia    Congestive heart failure (CHF) (HCC)    Constipation    Depression 1998   Essential hypertension    Fatty liver    GERD (gastroesophageal reflux disease)    Glaucoma    High cholesterol  History of thyroid  cancer 2011   Papillary thyroid  cancer s/p radiation   Hypertension    Hypothyroidism    Joint pain    Lumbar spinal stenosis    Obesity    Osteoarthritis    Palpitations    PONV (postoperative nausea and vomiting)    Rheumatoid arthritis (HCC)    Seasonal allergies    Swallowing difficulty    Type 2 diabetes mellitus with diabetic neuropathy (HCC)     Past Surgical History:  Procedure Laterality Date   BACK SURGERY     BIOPSY N/A 01/16/2013   Procedure: BIOPSY;   Surgeon: Alyce Jubilee, MD;  [facility];  Service: Endoscopy;  Laterality: N/A;  SMALL BOWEL ULCERS   BRAVO PH STUDY N/A 11/22/2015   Procedure: BRAVO PH STUDY;  Surgeon: Alyce Jubilee, MD;  [facility];  Service: Endoscopy;  Laterality: N/A;   BREAST BIOPSY Left 10/23/2022   US  LT BREAST BX W LOC DEV EA ADD LESION IMG BX SPEC US  GUIDE 10/23/2022 [facility]   BREAST BIOPSY Left 10/23/2022   US  LT BREAST BX W LOC DEV 1ST LESION IMG BX SPEC US  GUIDE 10/23/2022 [facility]   BREAST BIOPSY Left 12/21/2022   US  LT RADIOACTIVE SEED LOC 12/21/2022 [facility]   BREAST LUMPECTOMY WITH RADIOACTIVE SEED LOCALIZATION Left 12/25/2022   Procedure: LEFT BREAST LUMPECTOMY WITH RADIOACTIVE SEED LOCALIZATION;  Surgeon: Dareen Ebbing, MD;  Location: [facility];  Service: General;  Laterality: Left;   CARPAL TUNNEL RELEASE     4 total, bilaterally   CESAREAN SECTION     CHOLECYSTECTOMY  1990's   COLONOSCOPY WITH ESOPHAGOGASTRODUODENOSCOPY (EGD) N/A 12/08/2012   SLF: 1. Normal mucosa in the terminal ileum 2. mild diverticulosis throughout the entire examined colon. 3. rectal bleeding due to moderate sized internal hemorrhoids.    ELBOW SURGERY Bilateral    ENTEROSCOPY N/A 01/16/2013   Procedure: ENTEROSCOPY;  Surgeon: Alyce Jubilee, MD;  [facility];  Service: Endoscopy;  Laterality: N/A;  2:00   ESOPHAGOGASTRODUODENOSCOPY N/A 11/22/2015   Procedure: ESOPHAGOGASTRODUODENOSCOPY (EGD);  Surgeon: Alyce Jubilee, MD;  [facility];  Service: Endoscopy;  Laterality: N/A;  130 - moved to 2:00 - office to notify pt   FOOT SURGERY Left 1999   bone spur   GIVENS CAPSULE STUDY N/A 12/31/2012   Procedure: GIVENS CAPSULE STUDY;  Surgeon: Alyce Jubilee, MD;  [facility];  Service: Endoscopy;  Laterality: N/A;  7:30   ROTATOR CUFF REPAIR Bilateral    bilaterally   SAVORY DILATION N/A 11/22/2015   Procedure: SAVORY DILATION;  Surgeon: Alyce Jubilee, MD;  [facility];  Service: Endoscopy;  Laterality: N/A;   SPINE SURGERY     Lumbar Fusion- Dr. Gwendlyn Lemmings   TONSILECTOMY, ADENOIDECTOMY, BILATERAL MYRINGOTOMY AND TUBES  1987   TONSILLECTOMY  1987   TOTAL THYROIDECTOMY  2011   TUBAL LIGATION      Family History  Problem Relation Age of Onset   Stroke Mother    Hyperlipidemia Mother    Hypertension Mother    Heart disease Mother    Sudden death Mother    Alcoholism Mother    Cancer Father    Heart disease Father    Hyperlipidemia Father    Lung cancer Father        Age 40   Breast cancer Sister    Heart attack Daughter 46   Hypertension Daughter  Breast cancer Cousin    Colon cancer Brother        Age 43   Other Brother        Agent orange, age 55, cancer    Social History Reviewed with no changes to be made today.   Outpatient Medications Prior to Visit  Medication Sig Dispense Refill   amitriptyline  (ELAVIL ) 10 MG tablet Take 1 tablet (10 mg total) by mouth at bedtime. 90 tablet 1   amLODipine  (NORVASC ) 10 MG tablet TAKE 1 TABLET BY MOUTH DAILY 100 tablet 2   aspirin  81 MG tablet Take 81 mg by mouth every other day.     atorvastatin  (LIPITOR) 40 MG tablet Take 1 tablet (40 mg total) by mouth daily. 90 tablet 3   Blood Glucose Monitoring Suppl (TRUE METRIX AIR GLUCOSE METER) w/Device KIT USE AS DIRECTED 1 kit 0   celecoxib  (CELEBREX ) 100 MG capsule TAKE 1 CAPSULE(100 MG) BY MOUTH TWICE DAILY AS NEEDED FOR MODERATE PAIN 60 capsule 2   chlorthalidone  (HYGROTON ) 25 MG tablet TAKE 1 TABLET BY MOUTH DAILY 100 tablet 1   ciclopirox  (PENLAC ) 8 % solution Apply one coat to toenail qd for 48 weeks.  Remove weekly with polish remover. (Patient taking differently: Apply 1 Application topically 3 (three) times a week.   Remove weekly with polish remover.) 6.6 mL 11   diclofenac  Sodium (VOLTAREN ) 1 % GEL Apply 1 Application topically 2 (two) times daily.     hydrALAZINE  (APRESOLINE ) 10 MG tablet Take 1 tablet (10 mg  total) by mouth 3 (three) times daily. 270 tablet 3   levothyroxine  (SYNTHROID ) 175 MCG tablet TAKE 1 TABLET BY MOUTH DAILY  BEFORE BREAKFAST 100 tablet 2   metFORMIN  (GLUCOPHAGE ) 1000 MG tablet TAKE 1 TABLET BY MOUTH TWICE  DAILY WITH MEALS 60 tablet 0   nebivolol  (BYSTOLIC ) 10 MG tablet TAKE 1 TABLET BY MOUTH DAILY 100 tablet 1   omeprazole  (PRILOSEC) 40 MG capsule Take 1 capsule (40 mg total) by mouth daily. 100 capsule 2   TRUE METRIX BLOOD GLUCOSE TEST test strip CHECK FINGER STICK BLOOD SUGAR TWICE DAILY AS DIRECTED 200 strip 3   zolpidem  (AMBIEN ) 5 MG tablet TAKE 1 TABLET EVERY NIGHT AT BEDTIME AS NEEDED FOR SLEEP 30 tablet 3   Semaglutide ,0.25 or 0.5MG /DOS, (OZEMPIC , 0.25 OR 0.5 MG/DOSE,) 2 MG/3ML SOPN Inject 0.25 mg into the skin once a week. 3 mL 1   No facility-administered medications prior to visit.    Allergies  Allergen Reactions   Lisinopril  Anaphylaxis    angioedema       Objective:    BP 127/69 (BP Location: Left Arm, Patient Position: Sitting, Cuff Size: Normal)   Pulse 69   Resp 19   Ht 5\' 5"  (1.651 m)   Wt 295 lb 3.2 oz (133.9 kg)   LMP 09/07/2011   SpO2 98%   BMI 49.12 kg/m  Wt Readings from Last 3 Encounters:  12/02/23 295 lb 3.2 oz (133.9 kg)  09/02/23 (!) 315 lb 3.2 oz (143 kg)  02/13/23 (!) 303 lb (137.4 kg)    Physical Exam Vitals and nursing note reviewed.  Constitutional:      Appearance: She is well-developed.  HENT:     Head: Normocephalic and atraumatic.  Cardiovascular:     Rate and Rhythm: Normal rate and regular rhythm.     Heart sounds: Normal heart sounds. No murmur heard.    No friction rub. No gallop.  Pulmonary:     Effort: Pulmonary  effort is normal. No tachypnea or respiratory distress.     Breath sounds: Normal breath sounds. No decreased breath sounds, wheezing, rhonchi or rales.  Chest:     Chest wall: No tenderness.  Abdominal:     General: Bowel sounds are normal.     Palpations: Abdomen is soft.  Musculoskeletal:         General: Normal range of motion.     Cervical back: Normal range of motion.  Skin:    General: Skin is warm and dry.  Neurological:     Mental Status: She is alert and oriented to person, place, and time.     Coordination: Coordination normal.  Psychiatric:        Behavior: Behavior normal. Behavior is cooperative.        Thought Content: Thought content normal.        Judgment: Judgment normal.          Patient has been counseled extensively about nutrition and exercise as well as the importance of adherence with medications and regular follow-up. The patient was given clear instructions to go to ER or return to medical center if symptoms don't improve, worsen or new problems develop. The patient verbalized understanding.   Follow-up: Return in about 3 months (around 03/03/2024).   Collins Dean, FNP-BC [facility] [place], [place] [phone]   12/02/2023, 9:47 AM

## 2023-12-03 ENCOUNTER — Encounter: Payer: Self-pay | Admitting: Nurse Practitioner

## 2023-12-03 LAB — LIPID PANEL
Chol/HDL Ratio: 3.1 ratio (ref 0.0–4.4)
Cholesterol, Total: 133 mg/dL (ref 100–199)
HDL: 43 mg/dL (ref >39)
LDL Chol Calc (NIH): 69 mg/dL (ref 0–99)
Triglycerides: 117 mg/dL (ref 0–149)
VLDL Cholesterol Cal: 21 mg/dL (ref 5–40)

## 2023-12-03 LAB — CMP14+EGFR
ALT: 14 IU/L (ref 0–32)
AST: 14 IU/L (ref 0–40)
Albumin: 4.3 g/dL (ref 3.9–4.9)
Alkaline Phosphatase: 87 IU/L (ref 44–121)
BUN/Creatinine Ratio: 13 (ref 12–28)
BUN: 14 mg/dL (ref 8–27)
Bilirubin Total: 0.2 mg/dL (ref 0.0–1.2)
CO2: 24 mmol/L (ref 20–29)
Calcium: 9.5 mg/dL (ref 8.7–10.3)
Chloride: 101 mmol/L (ref 96–106)
Creatinine, Ser: 1.11 mg/dL — ABNORMAL HIGH (ref 0.57–1.00)
Globulin, Total: 2 g/dL (ref 1.5–4.5)
Glucose: 123 mg/dL — ABNORMAL HIGH (ref 70–99)
Potassium: 4.2 mmol/L (ref 3.5–5.2)
Sodium: 142 mmol/L (ref 134–144)
Total Protein: 6.3 g/dL (ref 6.0–8.5)
eGFR: 55 mL/min/{1.73_m2} — ABNORMAL LOW (ref >59)

## 2023-12-19 ENCOUNTER — Ambulatory Visit: Attending: Internal Medicine | Admitting: Internal Medicine

## 2023-12-19 VITALS — BP 143/76 | HR 66 | Ht 65.0 in | Wt 293.0 lb

## 2023-12-19 DIAGNOSIS — R011 Cardiac murmur, unspecified: Secondary | ICD-10-CM | POA: Diagnosis not present

## 2023-12-19 DIAGNOSIS — R400 Somnolence: Secondary | ICD-10-CM

## 2023-12-19 DIAGNOSIS — R079 Chest pain, unspecified: Secondary | ICD-10-CM | POA: Diagnosis not present

## 2023-12-19 DIAGNOSIS — R072 Precordial pain: Secondary | ICD-10-CM

## 2023-12-19 DIAGNOSIS — I10 Essential (primary) hypertension: Secondary | ICD-10-CM | POA: Diagnosis not present

## 2023-12-19 MED ORDER — METOPROLOL TARTRATE 50 MG PO TABS: 50.0000 mg | ORAL_TABLET | Freq: Once | ORAL | 0 refills | Status: DC

## 2023-12-19 NOTE — Patient Instructions (Signed)
 Medication Instructions:  Your physician recommends that you continue on your current medications as directed. Please refer to the Current Medication list given to you today.  *If you need a refill on your cardiac medications before your next appointment, please call your pharmacy*  Lab Work: NONE  If you have labs (blood work) drawn today and your tests are completely normal, you will receive your results only by: MyChart Message (if you have MyChart) OR A paper copy in the mail If you have any lab test that is abnormal or we need to change your treatment, we will call you to review the results.  Testing/Procedures: Your physician has requested that you have an echocardiogram. Echocardiography is a painless test that uses sound waves to create images of your heart. It provides your doctor with information about the size and shape of your heart and how well your heart's chambers and valves are working. This procedure takes approximately one hour. There are no restrictions for this procedure. Please do NOT wear cologne, perfume, aftershave, or lotions (deodorant is allowed). Please arrive 15 minutes prior to your appointment time.  Please note: We ask at that you not bring children with you during ultrasound (echo/ vascular) testing. Due to room size and safety concerns, children are not allowed in the ultrasound rooms during exams. Our front office staff cannot provide observation of children in our lobby area while testing is being conducted. An adult accompanying a patient to their appointment will only be allowed in the ultrasound room at the discretion of the ultrasound technician under special circumstances. We apologize for any inconvenience.   Your physician has requested that you have a sleep study.   Your physician has requested that you have cardiac CT. Cardiac computed tomography (CT) is a painless test that uses an x-ray machine to take clear, detailed pictures of your heart. For  further information please visit [email or web address] Please follow instruction sheet as given.    Follow-Up: At [facility], you and your health needs are our priority.  As part of our continuing mission to provide you with exceptional heart care, our providers are all part of one team.  This team includes your primary Cardiologist (physician) and Advanced Practice Providers or APPs (Physician Assistants and Nurse Practitioners) who all work together to provide you with the care you need, when you need it.  Your next appointment:   3 month(s)  Provider:   One of our Advanced Practice Providers (APPs): Melita Springer, PA-C  Friddie Jetty, NP Evaline Hill, NP  Theotis Flake, PA-C Lawana Pray, NP  Willis Harter, PA-C Lovette Rud, PA-C  [place], [place] Charles Connor, NP  Marlana Silvan, NP Marcie Sever, PA-C  Laquita Plant, PA-C    Dayna Dunn, PA-C  Marlyse Single, PA-C Palmer Bobo, NP Katlyn West, NP Sharren Decree, PA-C  Evan Williams, PA-C Sheng Haley, PA-C  Xika Zhao, NP Liane Redman, PA-C    Other Instructions  Your cardiac CT will be scheduled at one of the below locations:   [facility] [address] [place], [place] [address] [phone]  OR  [facility] [address] [place], [place] [address] [phone]  OR   [facility] [address] [place], [place] [address] [phone]  OR   [facility] [address] [place], [place] [address] [phone]  OR   [facility] [address]  [place], [place]  [id number] Opening November 25, 2023  If scheduled at [facility], please arrive at the [facility] (Entrance C2) of [facility] 30 minutes prior to test start time. You can use the FREE valet parking offered at entrance C (encouraged to control the heart rate for the test)   Proceed to the [facility] Radiology Department (first floor) to check-in and test prep.   All radiology patients and guests should use entrance C2 at [facility], accessed from [facility], even though the hospital's physical address listed is [address]    If scheduled at the Heart and Vascular Tower at [address], please enter the parking lot using the [address] entrance and use the FREE valet service at the patient drop-off area. Enter the buidling and check-in with registration on the main floor.  If scheduled at [facility] or [facility], please arrive 15 mins early for check-in and test prep.  There is spacious parking and easy access to the radiology department from the [facility] entrance. Please enter here and check-in with the desk attendant.   If scheduled at [facility], please arrive 30 minutes early for check-in and test prep.  Please follow these instructions carefully (unless otherwise directed):  An IV will be required for this test and Nitroglycerin will be given.  Hold all erectile dysfunction medications at least 3 days (72 hrs) prior to test. (Ie viagra, cialis, sildenafil, tadalafil, etc)   On the Night Before the Test: Be sure to Drink plenty of water . Do not consume any caffeinated/decaffeinated beverages or chocolate 12 hours prior to your test. Do not take any antihistamines 12 hours prior to your test.  On the Day of the Test: Drink plenty of water  until 1 hour prior to the test. Do not eat any food 1 hour prior to test. You may take your regular medications prior to the test.  Take metoprolol  (Lopressor ) two hours prior to test. If you take Furosemide /Hydrochlorothiazide/Spironolactone/Chlorthalidone , please HOLD on the morning of the test. Patients who wear a continuous glucose monitor MUST remove the device prior to scanning. FEMALES- please  wear underwire-free bra if available, avoid dresses & tight clothing       After the Test: Drink plenty of water . After receiving IV contrast, you may experience a mild flushed feeling. This is normal. On occasion, you may experience a mild rash up to 24 hours after the test. This is not dangerous. If this occurs, you can take Benadryl  25 mg, Zyrtec, Claritin, or Allegra and increase your fluid intake. (Patients taking Tikosyn should avoid Benadryl , and may take Zyrtec, Claritin, or Allegra) If you experience trouble breathing, this can be serious. If it is severe call [phone] IMMEDIATELY. If it is mild, please call our office.  We will call to schedule your test 2-4 weeks out understanding that some insurance companies will need an authorization prior to the service being performed.   For more information and frequently asked questions, please visit our website : [email or web address]  For non-scheduling related questions, please contact the cardiac imaging nurse navigator should you have any questions/concerns: Cardiac Imaging Nurse Navigators Direct Office Dial: [phone]   For scheduling needs, including cancellations and rescheduling, please call [place], [phone].

## 2023-12-19 NOTE — Progress Notes (Signed)
 Cardiology Office Note:  .    Date:  12/19/2023  ID:  MARGARETTE Carson, DOB 1955-08-03, MRN [id number] PCP: Collins Dean, NP  [facility] Providers Cardiologist:  None     CC: Chest squeezing Consulted for the evaluation of CP at the behest of Ms. Jamal Mays, NP   History of Present Illness: .    Autumn Carson is a 68 y.o. female with hyperlipidemia and hypertension who presents with atypical chest pain and a heart murmur.  She has been experiencing chest discomfort since February or March, described as a squeezing sensation in the chest that occasionally stings. The discomfort occurs sporadically while sitting and can be triggered by exertion, such as walking long distances or climbing stairs. No dizziness, shortness of breath, or palpitations, although her heart rate increases with exertion.  Her past cardiac evaluations include a stress test and echocardiogram in 2017, which showed no significant issues. A carotid ultrasound also showed no evidence of carotid artery stenosis.  She has a family history of heart disease, with her mother passing from a heart attack at 38 and her father at 52, although he also had cancer. Her daughter had a heart attack at 58 and cardiac arrest at 61. Her brother also has heart issues.  She is currently on atorvastatin  for hyperlipidemia, aspirin  81 mg, chlorthalidone , amlodipine , and nebivolol  for blood pressure management, and Ozempic  and metformin  for blood sugar control.  She works in home care, providing physical assistance to elderly patients, which involves significant physical activity. She does not engage in formal exercise but acknowledges that her work is physically demanding.  She reports sleep issues, takes medication for sleep, and experiences snoring and waking during sleep. She has not been treated for sleep apnea.  Discussed the use of AI scribe software for clinical note transcription with the patient, who gave  verbal consent to proceed.   Relevant histories: .  Social  - Mother passed away from heart attack - Father passed away from heart attack - Daughter had heart attack at 59, cardiac arrest at 51 - Older brother had tricuspid regurgitation - Sister passed away from tricuspid regurgitation ROS: As per HPI.   Studies Reviewed: .     Cardiac Studies & Procedures   ______________________________________________________________________________________________   STRESS TESTS  NM MYOCAR MULTI W/SPECT W 08/10/2015  Narrative  There was no ST segment deviation noted during stress.  The study is normal.  This is a low risk study.  Nuclear stress EF: 62%.   ECHOCARDIOGRAM  ECHOCARDIOGRAM COMPLETE 04/17/2016  Narrative *Autumn Carson* *Autumn Carson* [address] [place], [place] [address] [phone]  ------------------------------------------------------------------- Transthoracic Echocardiography  Patient:    Autumn, Carson MR #:       [id number] Study Date: 04/17/2016 Gender:     F Age:        3 Height:     167.6 cm Weight:     134.4 kg BSA:        2.58 m^2 Pt. Status: Room:       2C01C  SONOGRAPHER  Autumn Carson [id number]    IHK, [id number] ADMITTING    Autumn Carson ATTENDING    RancourMara Carson [id number] PERFORMING   Autumn Carson  cc:  ------------------------------------------------------------------- LV EF: 60% -   65%  ------------------------------------------------------------------- Indications:      Abnormal EKG 794.31.  ------------------------------------------------------------------- History:   PMH:  Obesity. Lumbar spinal stenosis. Hypothyroidism. Thyroid   cancer. Chronic anemia. Back pain.  Risk factors: Hypertension. Diabetes mellitus.  ------------------------------------------------------------------- Study Conclusions  - Left ventricle: The cavity size was  moderately dilated. There was mild concentric hypertrophy. Systolic function was normal. The estimated ejection fraction was in the range of 60% to 65%. Wall motion was normal; there were no regional wall motion abnormalities. Left ventricular diastolic function parameters were normal. Doppler parameters are consistent with indeterminate ventricular filling pressure. - Aortic valve: Transvalvular velocity was within the normal range. There was no stenosis. There was no regurgitation. - Mitral valve: Transvalvular velocity was within the normal range. There was no evidence for stenosis. There was no regurgitation. - Left atrium: The atrium was severely dilated. - Right ventricle: The cavity size was mildly dilated. Wall thickness was normal. Systolic function was normal. - Tricuspid valve: There was no regurgitation.  ------------------------------------------------------------------- Study data:   Study status:  Routine.  Procedure:  Transthoracic echocardiography. Image quality was adequate.  Study completion: There were no complications.          Transthoracic echocardiography.  M-mode, complete 2D, spectral Doppler, and color Doppler.  Birthdate:  Patient birthdate: Nov 23, 1955.  Age:  Patient is 68 yr old.  Sex:  Gender: female.    BMI: 47.8 kg/m^2.  Blood pressure:     146/69  Patient status:  Carson.  Study date: Study date: 04/17/2016. Study time: 02:43 PM.  Location:  ICU/CCU  -------------------------------------------------------------------  ------------------------------------------------------------------- Left ventricle:  The cavity size was moderately dilated. There was mild concentric hypertrophy. Systolic function was normal. The estimated ejection fraction was in the range of 60% to 65%. Wall motion was normal; there were no regional wall motion abnormalities. The transmitral flow pattern was normal. The deceleration time of the early transmitral flow velocity  was normal. The pulmonary vein flow pattern was normal. The tissue Doppler parameters were normal. Left ventricular diastolic function parameters were normal. Doppler parameters are consistent with indeterminate ventricular filling pressure.  ------------------------------------------------------------------- Aortic valve:   Trileaflet; normal thickness leaflets. Mobility was not restricted.  Doppler:  Transvalvular velocity was within the normal range. There was no stenosis. There was no regurgitation.  ------------------------------------------------------------------- Aorta:  Aortic root: The aortic root was normal in size.  ------------------------------------------------------------------- Mitral valve:   Structurally normal valve.   Mobility was not restricted.  Doppler:  Transvalvular velocity was within the normal range. There was no evidence for stenosis. There was no regurgitation.    Peak gradient (D): 4 mm Hg.  ------------------------------------------------------------------- Left atrium:  The atrium was severely dilated.  ------------------------------------------------------------------- Right ventricle:  The cavity size was mildly dilated. Wall thickness was normal. Systolic function was normal.  ------------------------------------------------------------------- Pulmonic valve:    Structurally normal valve.   Cusp separation was normal.  Doppler:  Transvalvular velocity was within the normal range. There was no evidence for stenosis. There was no regurgitation.  ------------------------------------------------------------------- Tricuspid valve:   Structurally normal valve.    Doppler: Transvalvular velocity was within the normal range. There was no regurgitation.  ------------------------------------------------------------------- Pulmonary artery:   The main pulmonary artery was  normal-sized.  ------------------------------------------------------------------- Right atrium:  The atrium was normal in size.  ------------------------------------------------------------------- Pericardium:  There was no pericardial effusion.  ------------------------------------------------------------------- Systemic veins: Inferior vena cava: The vessel was normal in size. The respirophasic diameter changes were in the normal range (>= 50%), consistent with normal central venous pressure.  ------------------------------------------------------------------- Measurements  Left ventricle  Value        Reference LV ID, ED, PLAX chordal        (H)     57    mm     43 - 52 LV ID, ES, PLAX chordal                37.6  mm     23 - 38 LV fx shortening, PLAX chordal         34    %      >=29 LV PW thickness, ED                    12.6  mm     --------- IVS/LV PW ratio, ED                    1            <=1.3 LV e&', lateral                         14.5  cm/s   --------- LV E/e&', lateral                       7.31         --------- LV e&', medial                          8.7   cm/s   --------- LV E/e&', medial                        12.18        --------- LV e&', average                         11.6  cm/s   --------- LV E/e&', average                       9.14         ---------  Ventricular septum                     Value        Reference IVS thickness, ED                      12.6  mm     ---------  LVOT                                   Value        Reference LVOT ID, S                             21    mm     --------- LVOT area                              3.46  cm^2   ---------  Aorta                                  Value        Reference Aortic root ID, ED  32    mm     ---------  Left atrium                            Value        Reference LA ID, A-P, ES                         38    mm     --------- LA ID/bsa, A-P                          1.47  cm/m^2 <=2.2 LA volume, S                           97.1  ml     --------- LA volume/bsa, S                       37.7  ml/m^2 --------- LA volume, ES, 1-p A4C                 93    ml     --------- LA volume/bsa, ES, 1-p A4C             36.1  ml/m^2 --------- LA volume, ES, 1-p A2C                 88.1  ml     --------- LA volume/bsa, ES, 1-p A2C             34.2  ml/m^2 ---------  Mitral valve                           Value        Reference Mitral E-wave peak velocity            106   cm/s   --------- Mitral A-wave peak velocity            67.9  cm/s   --------- Mitral deceleration time               222   ms     150 - 230 Mitral peak gradient, D                4     mm Hg  --------- Mitral E/A ratio, peak                 1.6          ---------  Systemic veins                         Value        Reference Estimated CVP                          3     mm Hg  ---------  Right ventricle                        Value        Reference RV s&', lateral, S                      14.5  cm/s   ---------  Legend: (L)  and  (H)  mark values outside specified reference range.  ------------------------------------------------------------------- Prepared and Electronically Authenticated by  Maudine Sos, MD 2017-09-19T16:32:13          ______________________________________________________________________________________________      Physical Exam:    VS:  BP (!) 143/76 (BP Location: Left Arm)   Pulse 66   Ht 5\' 5"  (1.651 m)   Wt 293 lb (132.9 kg)   LMP 09/07/2011   SpO2 96%   BMI 48.76 kg/m    Wt Readings from Last 3 Encounters:  12/19/23 293 lb (132.9 kg)  12/02/23 295 lb 3.2 oz (133.9 kg)  09/02/23 (!) 315 lb 3.2 oz (143 kg)    Gen: no distress Morbid obesiy Neck: No JVD Cardiac: No Rubs or Gallops, systolic Murmur, Distant heart sounds, RRR +2 radial pulses Respiratory: Clear to auscultation bilaterally, normal effort, normal  respiratory rate GI:  Soft, nontender, non-distended  MS: No  edema;  moves all extremities Integument: Skin feels warm Neuro:  At time of evaluation, alert and oriented to person/Carson/time/situation  Psych: Normal affect, patient feels ok   ASSESSMENT AND PLAN: .    Atypical chest pain Intermittent precordial chest pain since February or March, described as squeezing and sometimes stinging, occurring mainly with exertion. Family history of heart disease, including early cardiac events in daughter. Differential includes coronary artery disease. Coronary artery CT scan is preferred over stress test to assess for coronary artery disease. Potential findings include no disease, nonobstructive disease, or significant blockage. Significant blockage may require medication, stent, or surgery. - Order coronary artery CT scan to assess for coronary artery disease. - Prescribe nitroglycerin sublingual as needed for chest pain.  Heart murmur Heart murmur noted by primary care team. Differential includes tricuspid regurgitation. Echocardiogram will help assess for this condition. - Order echocardiogram to evaluate heart murmur and assess for tricuspid regurgitation.  Hypertension - Blood pressure is slightly elevated today but is usually well controlled with current medication regimen. - no change in therapy  Hyperlipidemia - Hyperlipidemia is at goal with current medication regimen.  First degree heart block EKG shows sinus rhythm with first degree heart block, unchanged from 2024 study. No acute intervention required.  Risk of sleep apnea High risk for sleep apnea with STOP-BANG score of 4 and symptoms of daytime somnolence and snoring. Family history of related cardiac issues. Sleep apnea testing is recommended to confirm diagnosis and guide treatment. - Recommend sleep apnea testing.  Team f/u in 3 months  If no high risk features may be eligible for graduation protocol  Gloriann Larger, MD FASE  [facility] Cardiologist [facility]  [address] [place], [address] [place], [place] [address] [phone]  3:02 PM

## 2024-01-01 ENCOUNTER — Encounter (HOSPITAL_COMMUNITY): Payer: Self-pay

## 2024-01-02 ENCOUNTER — Telehealth: Payer: Self-pay

## 2024-01-02 ENCOUNTER — Telehealth: Payer: Self-pay | Admitting: Nurse Practitioner

## 2024-01-02 NOTE — Telephone Encounter (Signed)
 Patient is prescribed atorvastatin  currently

## 2024-01-02 NOTE — Telephone Encounter (Signed)
 Copied from CRM [id number]. Topic: General - Other >> Jan 02, 2024 10:18 AM DeAngela L wrote:  Reason for CRM: Mariah Shines call with [facility] will send a fax about the clinical open care opportunity with same information for patient and the call back number [phone] for any questions  clinical open care opportunity for the Dr Jamal Mays to review  Noticed the patient may have diabetes and not on a statin medication per current ada guideline an ACC/AHA cholesterol guide line recommendations Please could the Dr reconsider reevaluating and prescribed a statin medication if appropriate  If appropriate please send to the patients pharmacy

## 2024-01-03 ENCOUNTER — Ambulatory Visit (HOSPITAL_COMMUNITY)
Admission: RE | Admit: 2024-01-03 | Discharge: 2024-01-03 | Disposition: A | Discharge status: Home / Self Care | Source: Ambulatory Visit | Attending: Internal Medicine | Admitting: Internal Medicine

## 2024-01-03 DIAGNOSIS — I10 Essential (primary) hypertension: Secondary | ICD-10-CM | POA: Diagnosis not present

## 2024-01-03 DIAGNOSIS — R011 Cardiac murmur, unspecified: Secondary | ICD-10-CM | POA: Insufficient documentation

## 2024-01-03 DIAGNOSIS — R072 Precordial pain: Secondary | ICD-10-CM | POA: Diagnosis not present

## 2024-01-03 DIAGNOSIS — R400 Somnolence: Secondary | ICD-10-CM | POA: Diagnosis not present

## 2024-01-03 MED ORDER — IOHEXOL 350 MG/ML SOLN
100.0000 mL | Freq: Once | INTRAVENOUS | Status: AC | PRN
  Administered 2024-01-03: 100 mL via INTRAVENOUS

## 2024-01-03 MED ORDER — NITROGLYCERIN 0.4 MG SL SUBL
0.8000 mg | SUBLINGUAL_TABLET | Freq: Once | SUBLINGUAL | Status: AC
  Administered 2024-01-03: 0.8 mg via SUBLINGUAL

## 2024-01-03 NOTE — Progress Notes (Signed)
 Patient tolerated CT well. Vital signs stable encourage to drink water throughout day.Reasons explained and verbalized understanding. Ambulated steady gait.

## 2024-01-10 ENCOUNTER — Other Ambulatory Visit: Payer: Self-pay | Admitting: Nurse Practitioner

## 2024-01-10 DIAGNOSIS — E119 Type 2 diabetes mellitus without complications: Secondary | ICD-10-CM

## 2024-01-15 ENCOUNTER — Ambulatory Visit: Payer: Self-pay

## 2024-01-16 NOTE — Telephone Encounter (Signed)
 Pt returning call

## 2024-01-20 ENCOUNTER — Other Ambulatory Visit: Payer: Self-pay | Admitting: Internal Medicine

## 2024-01-27 ENCOUNTER — Ambulatory Visit (INDEPENDENT_AMBULATORY_CARE_PROVIDER_SITE_OTHER): Admitting: Podiatry

## 2024-01-27 ENCOUNTER — Encounter: Payer: Self-pay | Admitting: Podiatry

## 2024-01-27 DIAGNOSIS — M79675 Pain in left toe(s): Secondary | ICD-10-CM | POA: Diagnosis not present

## 2024-01-27 DIAGNOSIS — E119 Type 2 diabetes mellitus without complications: Secondary | ICD-10-CM | POA: Diagnosis not present

## 2024-01-27 DIAGNOSIS — B351 Tinea unguium: Secondary | ICD-10-CM | POA: Diagnosis not present

## 2024-01-27 DIAGNOSIS — E1143 Type 2 diabetes mellitus with diabetic autonomic (poly)neuropathy: Secondary | ICD-10-CM

## 2024-01-27 DIAGNOSIS — M79674 Pain in right toe(s): Secondary | ICD-10-CM | POA: Diagnosis not present

## 2024-01-27 DIAGNOSIS — M2141 Flat foot [pes planus] (acquired), right foot: Secondary | ICD-10-CM | POA: Diagnosis not present

## 2024-01-27 DIAGNOSIS — L84 Corns and callosities: Secondary | ICD-10-CM | POA: Diagnosis not present

## 2024-01-27 DIAGNOSIS — M2142 Flat foot [pes planus] (acquired), left foot: Secondary | ICD-10-CM | POA: Diagnosis not present

## 2024-01-27 NOTE — Progress Notes (Signed)
 ANNUAL DIABETIC FOOT EXAM  Subjective: Autumn Carson presents today for annual diabetic foot exam. Chief Complaint  Patient presents with   [facility]    Rm1 DFC/ Diabetic/Dr. Theotis last visit Feb 2025/ A1c 6    Patient confirms h/o diabetes.  Patient denies any h/o foot wounds.  Patient has been diagnosed with neuropathy.  Autumn Carson ORN, NP is patient's PCP.  Past Medical History:  Diagnosis Date   Anemia    Anxiety    Arthritis of both knees    Back pain    Back pain    Bilateral swelling of feet    Cancer (HCC)    Thyroid    Carpal tunnel syndrome    both wrists   Chest pain    Chronic anemia    Congestive heart failure (CHF) (HCC)    Constipation    Depression 1998   Essential hypertension    Fatty liver    GERD (gastroesophageal reflux disease)    Glaucoma    High cholesterol    History of thyroid  cancer 2011   Papillary thyroid  cancer s/p radiation   Hypertension    Hypothyroidism    Joint pain    Lumbar spinal stenosis    Obesity    Osteoarthritis    Palpitations    PONV (postoperative nausea and vomiting)    Rheumatoid arthritis (HCC)    Seasonal allergies    Swallowing difficulty    Type 2 diabetes mellitus with diabetic neuropathy [facility])    Patient Active Problem List   Diagnosis Date Noted   Osteoarthritis of knees, bilateral 09/12/2023   Lumbar radiculopathy 05/30/2022   Malignant neoplasm of thyroid  gland (HCC) 05/01/2021   History of papillary adenocarcinoma of thyroid  12/13/2020   Postoperative hypothyroidism 12/13/2020   Severe obesity (HCC) 12/13/2020   Arthrodesis status 03/15/2020   Lumbar foraminal stenosis 02/12/2020   Spondylolisthesis 12/10/2019   Status post lumbar spinal fusion 12/10/2019   Chronic insomnia 07/15/2018   Dysphagia, idiopathic 10/27/2015   OA (osteoarthritis) of knee 06/23/2014   Knee pain, right 09/16/2013   Spinal stenosis of lumbar region 07/08/2013   Spinal stenosis, lumbar region, with  neurogenic claudication 04/07/2013   Hypothyroidism 02/10/2013   Class 3 obesity 02/10/2013   DDD (degenerative disc disease), lumbar 12/16/2012   FH: colon cancer 11/26/2012   Chronic anemia 11/26/2012   Constipation 11/26/2012   Depression with anxiety 07/29/2012   Diabetic neuropathy (HCC) 06/12/2012   Leg swelling 06/12/2012   GERD 04/12/2009   Type 2 diabetes mellitus (HCC) 01/13/2009   Hyperlipidemia 01/13/2009   Essential hypertension 01/13/2009   Osteoarthritis 01/13/2009   Past Surgical History:  Procedure Laterality Date   BACK SURGERY     BIOPSY N/A 01/16/2013   Procedure: BIOPSY;  Surgeon: Margo LITTIE Haddock, MD;  [facility];  Service: Endoscopy;  Laterality: N/A;  SMALL BOWEL ULCERS   BRAVO PH STUDY N/A 11/22/2015   Procedure: BRAVO PH STUDY;  Surgeon: Margo LITTIE Haddock, MD;  [facility];  Service: Endoscopy;  Laterality: N/A;   BREAST BIOPSY Left 10/23/2022   US  LT BREAST BX W LOC DEV EA ADD LESION IMG BX SPEC US  GUIDE 10/23/2022 [facility]   BREAST BIOPSY Left 10/23/2022   US  LT BREAST BX W LOC DEV 1ST LESION IMG BX SPEC US  GUIDE 10/23/2022 [facility]   BREAST BIOPSY Left 12/21/2022   US  LT RADIOACTIVE SEED LOC 12/21/2022 [facility]   BREAST LUMPECTOMY WITH RADIOACTIVE SEED LOCALIZATION Left  12/25/2022   Procedure: LEFT BREAST LUMPECTOMY WITH RADIOACTIVE SEED LOCALIZATION;  Surgeon: Belinda Cough, MD;  Location: [facility];  Service: General;  Laterality: Left;   CARPAL TUNNEL RELEASE     4 total, bilaterally   CESAREAN SECTION     CHOLECYSTECTOMY  1990's   COLONOSCOPY WITH ESOPHAGOGASTRODUODENOSCOPY (EGD) N/A 12/08/2012   SLF: 1. Normal mucosa in the terminal ileum 2. mild diverticulosis throughout the entire examined colon. 3. rectal bleeding due to moderate sized internal hemorrhoids.    ELBOW SURGERY Bilateral    ENTEROSCOPY N/A 01/16/2013   Procedure: ENTEROSCOPY;  Surgeon: Margo LITTIE Haddock, MD;  [facility];   Service: Endoscopy;  Laterality: N/A;  2:00   ESOPHAGOGASTRODUODENOSCOPY N/A 11/22/2015   Procedure: ESOPHAGOGASTRODUODENOSCOPY (EGD);  Surgeon: Margo LITTIE Haddock, MD;  [facility];  Service: Endoscopy;  Laterality: N/A;  130 - moved to 2:00 - office to notify pt   FOOT SURGERY Left 1999   bone spur   GIVENS CAPSULE STUDY N/A 12/31/2012   Procedure: GIVENS CAPSULE STUDY;  Surgeon: Margo LITTIE Haddock, MD;  [facility];  Service: Endoscopy;  Laterality: N/A;  7:30   ROTATOR CUFF REPAIR Bilateral    bilaterally   SAVORY DILATION N/A 11/22/2015   Procedure: SAVORY DILATION;  Surgeon: Margo LITTIE Haddock, MD;  [facility];  Service: Endoscopy;  Laterality: N/A;   SPINE SURGERY     Lumbar Fusion- Dr. Malcolm   TONSILECTOMY, ADENOIDECTOMY, BILATERAL MYRINGOTOMY AND TUBES  1987   TONSILLECTOMY  1987   TOTAL THYROIDECTOMY  2011   TUBAL LIGATION     Current Outpatient Medications on File Prior to Visit  Medication Sig Dispense Refill   amitriptyline  (ELAVIL ) 10 MG tablet Take 1 tablet (10 mg total) by mouth at bedtime. 90 tablet 1   amLODipine  (NORVASC ) 10 MG tablet TAKE 1 TABLET BY MOUTH DAILY 100 tablet 2   aspirin  81 MG tablet Take 81 mg by mouth every other day.     atorvastatin  (LIPITOR) 40 MG tablet Take 1 tablet (40 mg total) by mouth daily. 90 tablet 3   Blood Glucose Monitoring Suppl (TRUE METRIX AIR GLUCOSE METER) w/Device KIT USE AS DIRECTED 1 kit 0   celecoxib  (CELEBREX ) 100 MG capsule TAKE 1 CAPSULE(100 MG) BY MOUTH TWICE DAILY AS NEEDED FOR MODERATE PAIN 60 capsule 2   chlorthalidone  (HYGROTON ) 25 MG tablet TAKE 1 TABLET BY MOUTH DAILY 100 tablet 1   diclofenac  Sodium (VOLTAREN ) 1 % GEL Apply 1 Application topically 2 (two) times daily.     FLUoxetine  (PROZAC ) 40 MG capsule daily.     hydrALAZINE  (APRESOLINE ) 10 MG tablet Take 1 tablet (10 mg total) by mouth 3 (three) times daily. 270 tablet 3   HYDROcodone -acetaminophen  (NORCO/VICODIN) 5-325 MG tablet as  needed for severe pain (pain score 7-10).     levothyroxine  (SYNTHROID ) 175 MCG tablet TAKE 1 TABLET BY MOUTH DAILY  BEFORE BREAKFAST 100 tablet 2   metFORMIN  (GLUCOPHAGE ) 1000 MG tablet TAKE 1 TABLET BY MOUTH TWICE  DAILY WITH MEALS 60 tablet 0   nebivolol  (BYSTOLIC ) 10 MG tablet TAKE 1 TABLET BY MOUTH DAILY 100 tablet 1   omeprazole  (PRILOSEC) 40 MG capsule Take 1 capsule (40 mg total) by mouth daily. 100 capsule 2   Semaglutide ,0.25 or 0.5MG /DOS, (OZEMPIC , 0.25 OR 0.5 MG/DOSE,) 2 MG/3ML SOPN INJECT SUBCUTANEOUSLY 0.5 MG  EVERY WEEK 6 mL 1   simvastatin  (ZOCOR ) 40 MG tablet daily at 6 PM.  TRUE METRIX BLOOD GLUCOSE TEST test strip CHECK FINGER STICK BLOOD SUGAR TWICE DAILY AS DIRECTED 200 strip 3   zolpidem  (AMBIEN ) 5 MG tablet TAKE 1 TABLET EVERY NIGHT AT BEDTIME AS NEEDED FOR SLEEP 30 tablet 3   ciclopirox  (PENLAC ) 8 % solution Apply one coat to toenail qd for 48 weeks.  Remove weekly with polish remover. (Patient taking differently: Apply 1 Application topically 3 (three) times a week.   Remove weekly with polish remover.) 6.6 mL 11   No current facility-administered medications on file prior to visit.    Allergies  Allergen Reactions   Lisinopril  Anaphylaxis    angioedema   Flavoring Agent     Other Reaction(s): throat swelling (fruit/juice)   Social History   Occupational History   Occupation: Disabled    Employer: UNEMPLOYED  Tobacco Use   Smoking status: Never    Passive exposure: Past   Smokeless tobacco: Never  Vaping Use   Vaping status: Never Used  Substance and Sexual Activity   Alcohol use: Yes    Comment: Occasional   Drug use: No   Sexual activity: Not Currently    Birth control/protection: Surgical   Family History  Problem Relation Age of Onset   Stroke Mother    Hyperlipidemia Mother    Hypertension Mother    Heart disease Mother    Sudden death Mother    Alcoholism Mother    Cancer Father    Heart disease Father    Hyperlipidemia Father    Lung  cancer Father        Age 27   Breast cancer Sister    Heart attack Daughter 61   Hypertension Daughter    Breast cancer Cousin    Colon cancer Brother        Age 3   Other Brother        Agent orange, age 79, cancer   Immunization History  Administered Date(s) Administered   Influenza Split 03/30/2014   Influenza, High Dose Seasonal PF 05/09/2023   Influenza,inj,Quad PF,6+ Mos 04/17/2016, 05/30/2017, 04/30/2019, 04/09/2020, 04/26/2021, 05/15/2022   Influenza,trivalent, recombinat, inj, PF 05/08/2015   Influenza-Unspecified 05/10/2018   Moderna Covid-19 Fall Seasonal Vaccine 51yrs & older 05/09/2023   Moderna Sars-Covid-2 Vaccination 09/29/2019, 10/27/2019, 05/31/2020   PNEUMOCOCCAL CONJUGATE-20 07/10/2021, 05/09/2023   Pneumococcal Polysaccharide-23 05/13/2013   Respiratory Syncytial Virus Vaccine,Recomb Aduvanted(Arexvy) 05/09/2023   Tdap 05/01/2021   Zoster Recombinant(Shingrix ) 08/08/2020, 10/06/2020     Review of Systems: Negative except as noted in the HPI.   Objective: There were no vitals filed for this visit.  TANEAL SONNTAG is a pleasant 68 y.o. female in NAD. AAO X 3.  Diabetic foot exam was performed with the following findings:   Vascular Examination: Capillary refill time immediate b/l. Palpable pedal pulses. Pedal hair present b/l. No pain with calf compression b/l. Skin temperature gradient WNL b/l. No cyanosis or clubbing b/l. No ischemia or gangrene noted b/l. Nonpitting edema noted BLE.  Neurological Examination: Sensation grossly intact b/l with 10 gram monofilament. Vibratory sensation intact b/l. Pt has subjective symptoms of neuropathy.  Dermatological Examination: Pedal skin with normal turgor, texture and tone b/l.  No open wounds. No interdigital macerations.   Toenails 1-5 b/l thick, discolored, elongated with subungual debris and pain on dorsal palpation.   Hyperkeratotic lesion(s) medial border left hallux.  No erythema, no edema, no  drainage, no fluctuance.  Musculoskeletal Examination: Muscle strength 5/5 to all lower extremity muscle groups bilaterally. Pes planus deformity noted  bilateral LE.[name] No pain, crepitus or joint limitation noted with ROM b/l LE.  Patient ambulates independently without assistive aids.  Radiographs: None     Lab Results  Component Value Date   HGBA1C 6.9 12/02/2023   ADA Risk Categorization: Low Risk :  Patient has all of the following: Intact protective sensation No prior foot ulcer  No severe deformity Pedal pulses present Assessment: 1. Pain due to onychomycosis of toenails of both feet   2. Callus   3. Pes planus of both feet   4. Type 2 diabetes mellitus with diabetic autonomic neuropathy, without long-term current use of insulin  (HCC)   5. Encounter for diabetic foot exam (HCC)      Plan: Diabetic foot examination performed today. All patient's and/or POA's questions/concerns addressed on today's visit. Toenails 1-5 debrided in length and girth without incident. Continue foot and shoe inspections daily. Monitor blood glucose per PCP/Endocrinologist's recommendations. Continue soft, supportive shoe gear daily. Report any pedal injuries to medical professional. Call office if there are any questions/concerns. -Patient/POA to call should there be question/concern in the interim. Return in about 3 months (around 04/28/2024).  Delon LITTIE Merlin, DPM      [place] LOCATION: [address], [place] [address]                   Office [phone]   [facility] LOCATION: [address] [place], [place] [address] Office [phone]

## 2024-01-30 ENCOUNTER — Other Ambulatory Visit: Payer: Self-pay | Admitting: Nurse Practitioner

## 2024-01-30 DIAGNOSIS — R519 Headache, unspecified: Secondary | ICD-10-CM

## 2024-02-02 IMAGING — DX DG CHEST 1V PORT
1 series · 1 of 1 positions shown · non-contrast
Comparison: Chest x-ray 04/01/2013.

CLINICAL DATA: 65-year-old female with history of cough and
congestion for 1 week. Positive COVID test.

EXAM:
PORTABLE CHEST 1 VIEW

[chest ap]
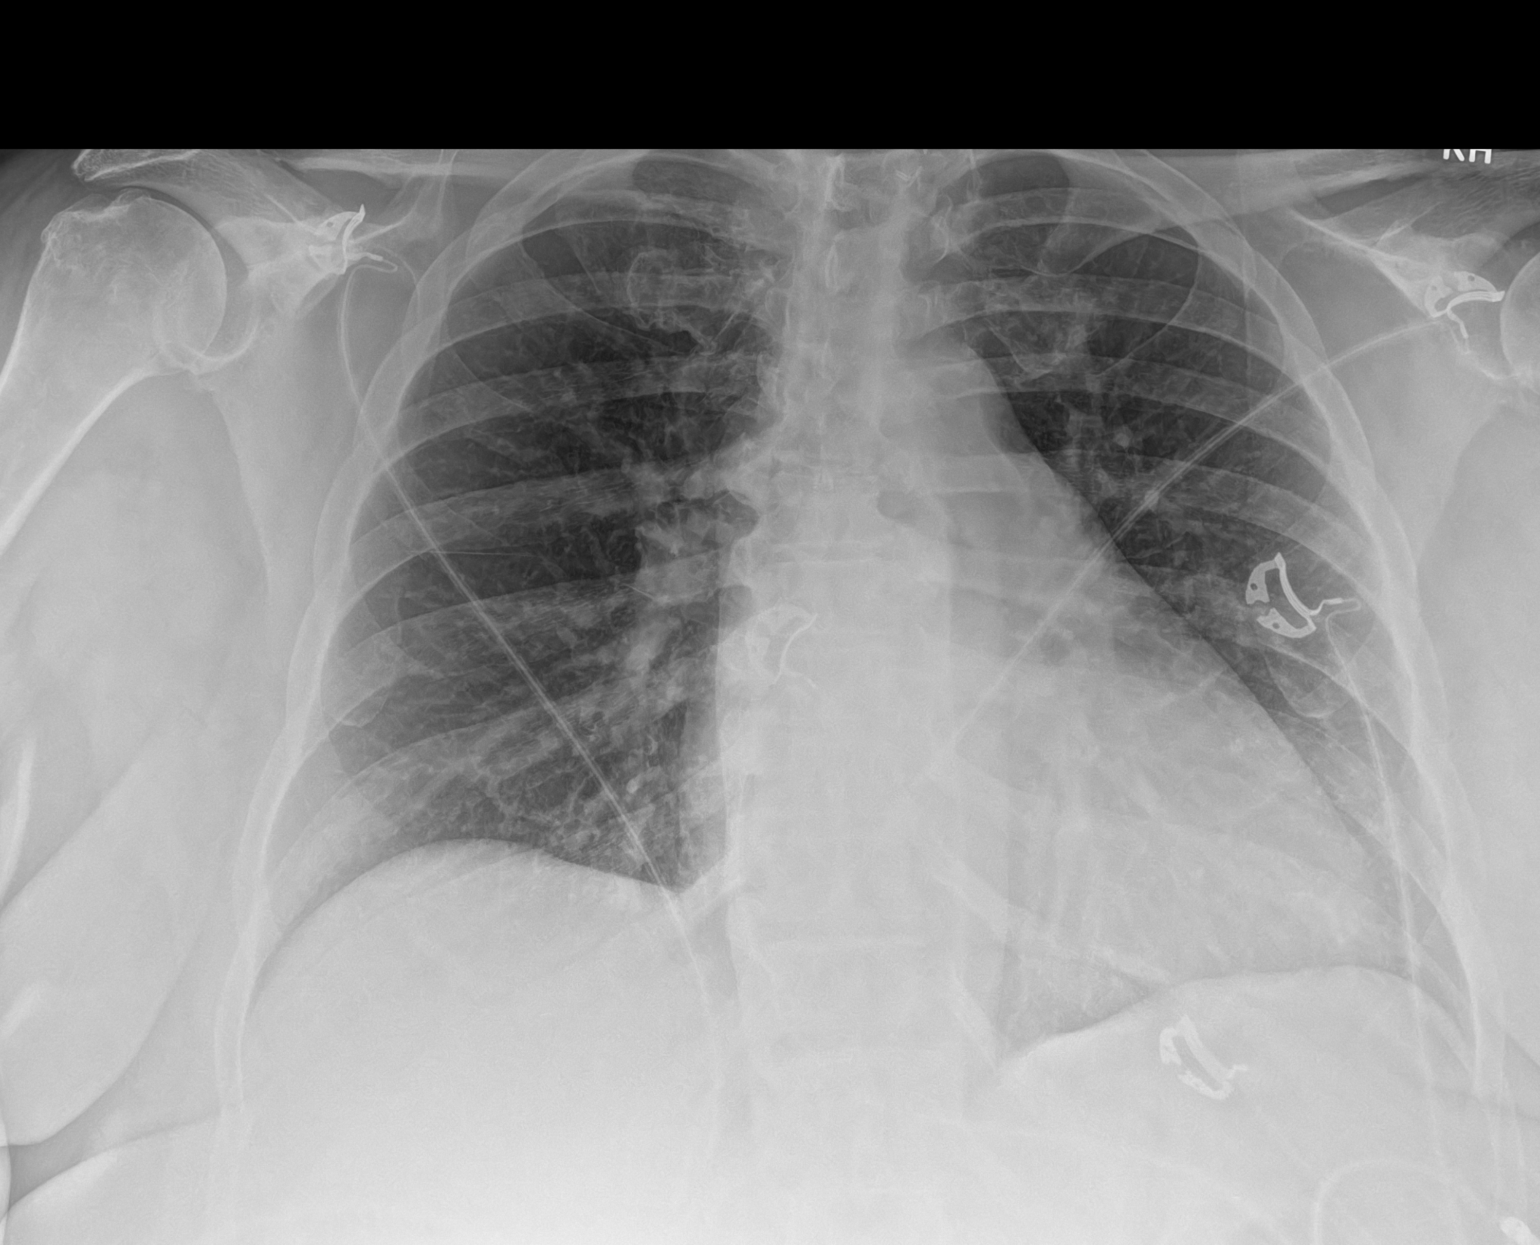

[1 of 1 positions shown; findings below may reference images not displayed]

FINDINGS: Lung volumes are normal. No consolidative airspace disease. No
pleural effusions. No pneumothorax. No pulmonary nodule or mass
noted. Pulmonary vasculature and the cardiomediastinal silhouette
are within normal limits. Surgical clips at the level of the
thoracic inlet, presumably from prior thyroidectomy.
IMPRESSION: No radiographic evidence of acute cardiopulmonary disease.

## 2024-02-05 ENCOUNTER — Ambulatory Visit (HOSPITAL_COMMUNITY)
Admission: RE | Admit: 2024-02-05 | Discharge: 2024-02-05 | Discharge status: Home / Self Care | Source: Ambulatory Visit | Attending: Internal Medicine

## 2024-02-05 DIAGNOSIS — R400 Somnolence: Secondary | ICD-10-CM

## 2024-02-05 DIAGNOSIS — R072 Precordial pain: Secondary | ICD-10-CM

## 2024-02-05 DIAGNOSIS — R011 Cardiac murmur, unspecified: Secondary | ICD-10-CM

## 2024-02-05 DIAGNOSIS — I10 Essential (primary) hypertension: Secondary | ICD-10-CM | POA: Diagnosis not present

## 2024-02-05 LAB — ECHOCARDIOGRAM COMPLETE
Area-P 1/2: 2.59 cm2
P 1/2 time: 453 ms
S' Lateral: 3.4 cm

## 2024-03-02 ENCOUNTER — Telehealth: Payer: Self-pay | Admitting: Nurse Practitioner

## 2024-03-02 NOTE — Telephone Encounter (Signed)
 Called patient to confirm upcoming appointment 03/03/2024 at 8:30 am. Patient appointment has been successfully confirmed

## 2024-03-03 ENCOUNTER — Ambulatory Visit: Attending: Nurse Practitioner | Admitting: Nurse Practitioner

## 2024-03-03 VITALS — BP 155/73 | HR 66 | Ht 65.0 in | Wt 284.0 lb

## 2024-03-03 DIAGNOSIS — E1169 Type 2 diabetes mellitus with other specified complication: Secondary | ICD-10-CM

## 2024-03-03 DIAGNOSIS — E669 Obesity, unspecified: Secondary | ICD-10-CM

## 2024-03-03 DIAGNOSIS — N3001 Acute cystitis with hematuria: Secondary | ICD-10-CM

## 2024-03-03 DIAGNOSIS — F5104 Psychophysiologic insomnia: Secondary | ICD-10-CM | POA: Diagnosis not present

## 2024-03-03 DIAGNOSIS — Z1231 Encounter for screening mammogram for malignant neoplasm of breast: Secondary | ICD-10-CM

## 2024-03-03 DIAGNOSIS — Z7985 Long-term (current) use of injectable non-insulin antidiabetic drugs: Secondary | ICD-10-CM

## 2024-03-03 DIAGNOSIS — R42 Dizziness and giddiness: Secondary | ICD-10-CM

## 2024-03-03 DIAGNOSIS — E785 Hyperlipidemia, unspecified: Secondary | ICD-10-CM

## 2024-03-03 DIAGNOSIS — I10 Essential (primary) hypertension: Secondary | ICD-10-CM | POA: Diagnosis not present

## 2024-03-03 DIAGNOSIS — E118 Type 2 diabetes mellitus with unspecified complications: Secondary | ICD-10-CM

## 2024-03-03 DIAGNOSIS — Z7984 Long term (current) use of oral hypoglycemic drugs: Secondary | ICD-10-CM

## 2024-03-03 DIAGNOSIS — Z6841 Body Mass Index (BMI) 40.0 and over, adult: Secondary | ICD-10-CM

## 2024-03-03 DIAGNOSIS — G4700 Insomnia, unspecified: Secondary | ICD-10-CM

## 2024-03-03 MED ORDER — ATORVASTATIN CALCIUM 40 MG PO TABS: 40.0000 mg | ORAL_TABLET | Freq: Every day | ORAL | 3 refills | Status: DC

## 2024-03-03 MED ORDER — ZOLPIDEM TARTRATE 5 MG PO TABS
ORAL_TABLET | ORAL | 3 refills | Status: DC
Start: 2024-03-03 — End: 2024-03-03

## 2024-03-03 MED ORDER — ZOLPIDEM TARTRATE 5 MG PO TABS: ORAL_TABLET | ORAL | 3 refills | Status: DC

## 2024-03-03 MED ORDER — MECLIZINE HCL 12.5 MG PO TABS: 12.5000 mg | ORAL_TABLET | Freq: Three times a day (TID) | ORAL | 0 refills | Status: DC | PRN

## 2024-03-03 MED ORDER — ATORVASTATIN CALCIUM 40 MG PO TABS: 40.0000 mg | ORAL_TABLET | Freq: Every day | ORAL | 2 refills | Status: AC

## 2024-03-03 MED ORDER — METFORMIN HCL 1000 MG PO TABS: 1000.0000 mg | ORAL_TABLET | Freq: Two times a day (BID) | ORAL | 1 refills | Status: AC

## 2024-03-03 MED ORDER — MECLIZINE HCL 12.5 MG PO TABS: 12.5000 mg | ORAL_TABLET | Freq: Three times a day (TID) | ORAL | 0 refills | Status: AC | PRN

## 2024-03-03 NOTE — Progress Notes (Unsigned)
 Assessment & Plan:  Anam Bobby was seen today for diabetes.  Diagnoses and all orders for this visit:  Dizziness -     Urinalysis, Complete -     CMP14+EGFR -     meclizine  (ANTIVERT ) 12.5 MG tablet; Take 1 tablet (12.5 mg total) by mouth 3 (three) times daily as needed for dizziness. -     Microscopic Examination Intermittent dizziness at night, possibly due to dehydration, elevated blood pressure, or heat exposure. Differential includes benign vertigo and cardiac issues. Thyroid  levels normal, no migraines, no earwax blockage. - Prescribed meclizine . - Check urine specific gravity for dehydration. - Monitor blood pressure during dizziness. - Order electrolyte panel and kidney function tests. - Consider carotid ultrasound if other tests are normal.   Chronic insomnia -     zolpidem  (AMBIEN ) 5 MG tablet; TAKE 1 TABLET EVERY NIGHT AT BEDTIME AS NEEDED FOR SLEEP  Controlled type 2 diabetes mellitus with complication, without long-term current use of insulin  (HCC) -     metFORMIN  (GLUCOPHAGE ) 1000 MG tablet; Take 1 tablet (1,000 mg total) by mouth 2 (two) times daily with a meal.  Dyslipidemia, goal LDL below 70 -     atorvastatin  (LIPITOR) 40 MG tablet; Take 1 tablet (40 mg total) by mouth daily. FOR CHOLESTEROL  Breast cancer screening by mammogram -     MM 3D SCREENING MAMMOGRAM BILATERAL BREAST; Future  Hypertension Elevated blood pressure, non adherence with hydralazine  r - Instructed to take hydralazine  three times daily. - Monitor blood pressure regularly, especially during dizziness.  Obesity Weight management with Ozempic  0.5 mg effective. Weight loss required for knee surgery, target under 300 pounds. - Continue Ozempic  0.5 mg. - Consider ortho appointment for weight goals discussion.  Insomnia Insomnia, previous Ambien  prescription not refilled since 2023. She takes this sparingly  General Health Maintenance Mammogram screening due. - Order mammogram and  arrange scheduling.  Patient has been counseled on age-appropriate routine health concerns for screening and prevention. These are reviewed and up-to-date. Referrals have been placed accordingly. Immunizations are up-to-date or declined.    Subjective:   Chief Complaint  Patient presents with   Diabetes    DM f/u.  Dizziness when waking up - room spinning X2 weeks   History of Present Illness Keneisha Heckart is a 68 year old female with past history of vertigo who presents with dizziness today.  She experiences dizziness primarily when lying down at night, describing it as her head 'just swings around and around.' These episodes last about 20 minutes and occur every other night. Dizziness can also occur during the day, typically when she is sitting or walking around the house, but these instances are less frequent.  She has a history of vertigo but states that this current dizziness feels different. The dizziness started a little after she began working in a hot environment in May, which might be contributing to her symptoms. She drinks about three and a half bottles of water  a day, which is less than recommended   No history of migraines and no episodes of syncope or presyncope. She has not been checking her blood pressure during these episodes, although she has a blood pressure machine at home.  She is currently on hydralazine , which she takes twice a day, although it is prescribed three times a day.  DM 2 A1c at goal. Currently taking metformin  and ozempic . She reports that it helps curb her appetite. Lab Results  Component Value Date   HGBA1C  6.9 12/02/2023      HTN  BP elevated. She is not taking hydralazine  as prescribed. BP Readings from Last 3 Encounters:  03/03/24 (!) 155/73  01/03/24 (!) 142/74  12/19/23 (!) 143/76     She is requesting a refill of her sleep aid for insomnia.    Review of Systems  Constitutional:  Negative for fever, malaise/fatigue  and weight loss.  HENT: Negative.  Negative for nosebleeds.   Eyes: Negative.  Negative for blurred vision, double vision and photophobia.  Respiratory: Negative.  Negative for cough and shortness of breath.   Cardiovascular: Negative.  Negative for chest pain, palpitations and leg swelling.  Gastrointestinal: Negative.  Negative for heartburn, nausea and vomiting.  Musculoskeletal: Negative.  Negative for myalgias.  Neurological:  Positive for dizziness. Negative for focal weakness, seizures and headaches.  Psychiatric/Behavioral:  Negative for suicidal ideas. The patient has insomnia.     Past Medical History:  Diagnosis Date   Anemia    Anxiety    Arthritis of both knees    Back pain    Back pain    Bilateral swelling of feet    Cancer (HCC)    Thyroid    Carpal tunnel syndrome    both wrists   Chest pain    Chronic anemia    Congestive heart failure (CHF) (HCC)    Constipation    Depression 1998   Essential hypertension    Fatty liver    GERD (gastroesophageal reflux disease)    Glaucoma    High cholesterol    History of thyroid  cancer 2011   Papillary thyroid  cancer s/p radiation   Hypertension    Hypothyroidism    Joint pain    Lumbar spinal stenosis    Obesity    Osteoarthritis    Palpitations    PONV (postoperative nausea and vomiting)    Rheumatoid arthritis (HCC)    Seasonal allergies    Swallowing difficulty    Type 2 diabetes mellitus with diabetic neuropathy (HCC)     Past Surgical History:  Procedure Laterality Date   BACK SURGERY     BIOPSY N/A 01/16/2013   Procedure: BIOPSY;  Surgeon: Margo LITTIE Haddock, MD;  [facility];  Service: Endoscopy;  Laterality: N/A;  SMALL BOWEL ULCERS   BRAVO PH STUDY N/A 11/22/2015   Procedure: BRAVO PH STUDY;  Surgeon: Margo LITTIE Haddock, MD;  [facility];  Service: Endoscopy;  Laterality: N/A;   BREAST BIOPSY Left 10/23/2022   US  LT BREAST BX W LOC DEV EA ADD LESION IMG BX SPEC US  GUIDE 10/23/2022  [facility]   BREAST BIOPSY Left 10/23/2022   US  LT BREAST BX W LOC DEV 1ST LESION IMG BX SPEC US  GUIDE 10/23/2022 [facility]   BREAST BIOPSY Left 12/21/2022   US  LT RADIOACTIVE SEED LOC 12/21/2022 [facility]   BREAST LUMPECTOMY WITH RADIOACTIVE SEED LOCALIZATION Left 12/25/2022   Procedure: LEFT BREAST LUMPECTOMY WITH RADIOACTIVE SEED LOCALIZATION;  Surgeon: Belinda Cough, MD;  [facility]: [facility];  Service: General;  Laterality: Left;   CARPAL TUNNEL RELEASE     4 total, bilaterally   CESAREAN SECTION     CHOLECYSTECTOMY  1990's   COLONOSCOPY WITH ESOPHAGOGASTRODUODENOSCOPY (EGD) N/A 12/08/2012   SLF: 1. Normal mucosa in the terminal ileum 2. mild diverticulosis throughout the entire examined colon. 3. rectal bleeding due to moderate sized internal hemorrhoids.    ELBOW SURGERY Bilateral    ENTEROSCOPY N/A 01/16/2013   Procedure: ENTEROSCOPY;  Surgeon:  Margo LITTIE Haddock, MD;  [facility];  Service: Endoscopy;  Laterality: N/A;  2:00   ESOPHAGOGASTRODUODENOSCOPY N/A 11/22/2015   Procedure: ESOPHAGOGASTRODUODENOSCOPY (EGD);  Surgeon: Margo LITTIE Haddock, MD;  [facility];  Service: Endoscopy;  Laterality: N/A;  130 - moved to 2:00 - office to notify pt   FOOT SURGERY Left 1999   bone spur   GIVENS CAPSULE STUDY N/A 12/31/2012   Procedure: GIVENS CAPSULE STUDY;  Surgeon: Margo LITTIE Haddock, MD;  [facility];  Service: Endoscopy;  Laterality: N/A;  7:30   ROTATOR CUFF REPAIR Bilateral    bilaterally   SAVORY DILATION N/A 11/22/2015   Procedure: SAVORY DILATION;  Surgeon: Margo LITTIE Haddock, MD;  [facility];  Service: Endoscopy;  Laterality: N/A;   SPINE SURGERY     Lumbar Fusion- Dr. Malcolm   TONSILECTOMY, ADENOIDECTOMY, BILATERAL MYRINGOTOMY AND TUBES  1987   TONSILLECTOMY  1987   TOTAL THYROIDECTOMY  2011   TUBAL LIGATION      Family History  Problem Relation Age of Onset   Stroke Mother    Hyperlipidemia Mother    Hypertension  Mother    Heart disease Mother    Sudden death Mother    Alcoholism Mother    Cancer Father    Heart disease Father    Hyperlipidemia Father    Lung cancer Father        Age 61   Breast cancer Sister    Heart attack Daughter 49   Hypertension Daughter    Breast cancer Cousin    Colon cancer Brother        Age 60   Other Brother        Agent orange, age 20, cancer    Social History Reviewed with no changes to be made today.   Outpatient Medications Prior to Visit  Medication Sig Dispense Refill   amitriptyline  (ELAVIL ) 10 MG tablet TAKE 1 TABLET BY MOUTH AT  BEDTIME 30 tablet 11   amLODipine  (NORVASC ) 10 MG tablet TAKE 1 TABLET BY MOUTH DAILY 100 tablet 2   aspirin  81 MG tablet Take 81 mg by mouth every other day.     celecoxib  (CELEBREX ) 100 MG capsule TAKE 1 CAPSULE(100 MG) BY MOUTH TWICE DAILY AS NEEDED FOR MODERATE PAIN 60 capsule 2   chlorthalidone  (HYGROTON ) 25 MG tablet TAKE 1 TABLET BY MOUTH DAILY 100 tablet 1   diclofenac  Sodium (VOLTAREN ) 1 % GEL Apply 1 Application topically 2 (two) times daily.     hydrALAZINE  (APRESOLINE ) 10 MG tablet Take 1 tablet (10 mg total) by mouth 3 (three) times daily. 270 tablet 3   HYDROcodone -acetaminophen  (NORCO/VICODIN) 5-325 MG tablet as needed for severe pain (pain score 7-10).     levothyroxine  (SYNTHROID ) 175 MCG tablet TAKE 1 TABLET BY MOUTH DAILY  BEFORE BREAKFAST 100 tablet 2   nebivolol  (BYSTOLIC ) 10 MG tablet TAKE 1 TABLET BY MOUTH DAILY 100 tablet 1   omeprazole  (PRILOSEC) 40 MG capsule Take 1 capsule (40 mg total) by mouth daily. 100 capsule 2   Semaglutide ,0.25 or 0.5MG /DOS, (OZEMPIC , 0.25 OR 0.5 MG/DOSE,) 2 MG/3ML SOPN INJECT SUBCUTANEOUSLY 0.5 MG  EVERY WEEK 6 mL 1   simvastatin  (ZOCOR ) 40 MG tablet daily at 6 PM.     TRUE METRIX BLOOD GLUCOSE TEST test strip CHECK FINGER STICK BLOOD SUGAR TWICE DAILY AS DIRECTED 200 strip 3   Blood Glucose Monitoring Suppl (TRUE METRIX AIR GLUCOSE METER) w/Device KIT USE AS DIRECTED 1  kit 0    metFORMIN  (GLUCOPHAGE ) 1000 MG tablet TAKE 1 TABLET BY MOUTH TWICE  DAILY WITH MEALS 60 tablet 0   zolpidem  (AMBIEN ) 5 MG tablet TAKE 1 TABLET EVERY NIGHT AT BEDTIME AS NEEDED FOR SLEEP 30 tablet 3   atorvastatin  (LIPITOR) 40 MG tablet Take 1 tablet (40 mg total) by mouth daily. (Patient not taking: Reported on 03/03/2024) 90 tablet 3   ciclopirox  (PENLAC ) 8 % solution Apply one coat to toenail qd for 48 weeks.  Remove weekly with polish remover. (Patient taking differently: Apply 1 Application topically 3 (three) times a week.   Remove weekly with polish remover.) 6.6 mL 11   No facility-administered medications prior to visit.    Allergies  Allergen Reactions   Lisinopril  Anaphylaxis    angioedema   Flavoring Agent     Other Reaction(s): throat swelling (fruit/juice)       Objective:    BP (!) 155/73 (BP Location: Left Arm, Patient Position: Sitting, Cuff Size: Normal)   Pulse 66   Ht 5' 5 (1.651 m)   Wt 284 lb (128.8 kg)   LMP 09/07/2011   SpO2 99%   BMI 47.26 kg/m  Wt Readings from Last 3 Encounters:  03/03/24 284 lb (128.8 kg)  12/19/23 293 lb (132.9 kg)  12/02/23 295 lb 3.2 oz (133.9 kg)    Physical Exam Vitals and nursing note reviewed.  Constitutional:      Appearance: She is well-developed. She is obese.  HENT:     Head: Normocephalic and atraumatic.  Cardiovascular:     Rate and Rhythm: Normal rate and regular rhythm.     Heart sounds: Normal heart sounds. No murmur heard.    No friction rub. No gallop.  Pulmonary:     Effort: Pulmonary effort is normal. No tachypnea or respiratory distress.     Breath sounds: Normal breath sounds. No decreased breath sounds, wheezing, rhonchi or rales.  Chest:     Chest wall: No tenderness.  Abdominal:     General: Bowel sounds are normal.     Palpations: Abdomen is soft.  Musculoskeletal:        General: Normal range of motion.     Cervical back: Normal range of motion.  Skin:    General: Skin is warm and dry.   Neurological:     Mental Status: She is alert and oriented to person, place, and time.     Coordination: Coordination normal.  Psychiatric:        Behavior: Behavior normal. Behavior is cooperative.        Thought Content: Thought content normal.        Judgment: Judgment normal.          Patient has been counseled extensively about nutrition and exercise as well as the importance of adherence with medications and regular follow-up. The patient was given clear instructions to go to ER or return to medical center if symptoms don't improve, worsen or new problems develop. The patient verbalized understanding.   Follow-up: Return in about 3 months (around 06/03/2024).   Haze LELON Servant, FNP-BC [facility] [place], [place] [phone]   03/05/2024, 8:02 PM

## 2024-03-04 LAB — MICROSCOPIC EXAMINATION
Bacteria, UA: NONE SEEN
Casts: NONE SEEN /LPF
WBC, UA: >30 /HPF — AB (ref 0–5)

## 2024-03-04 LAB — URINALYSIS, COMPLETE
Bilirubin, UA: NEGATIVE
Glucose, UA: NEGATIVE
Ketones, UA: NEGATIVE
Nitrite, UA: NEGATIVE
Protein,UA: NEGATIVE
Specific Gravity, UA: 1.006 (ref 1.005–1.030)
Urobilinogen, Ur: 0.2 mg/dL (ref 0.2–1.0)
pH, UA: 7 (ref 5.0–7.5)

## 2024-03-04 LAB — CMP14+EGFR
ALT: 12 IU/L (ref 0–32)
AST: 14 IU/L (ref 0–40)
Albumin: 4.5 g/dL (ref 3.9–4.9)
Alkaline Phosphatase: 87 IU/L (ref 44–121)
BUN/Creatinine Ratio: 14 (ref 12–28)
BUN: 17 mg/dL (ref 8–27)
Bilirubin Total: 0.3 mg/dL (ref 0.0–1.2)
CO2: 25 mmol/L (ref 20–29)
Calcium: 9.3 mg/dL (ref 8.7–10.3)
Chloride: 97 mmol/L (ref 96–106)
Creatinine, Ser: 1.23 mg/dL — ABNORMAL HIGH (ref 0.57–1.00)
Globulin, Total: 2.5 g/dL (ref 1.5–4.5)
Glucose: 97 mg/dL (ref 70–99)
Potassium: 3.9 mmol/L (ref 3.5–5.2)
Sodium: 138 mmol/L (ref 134–144)
Total Protein: 7 g/dL (ref 6.0–8.5)
eGFR: 48 mL/min/1.73 — ABNORMAL LOW (ref >59)

## 2024-03-04 NOTE — Progress Notes (Incomplete)
 Assessment & Plan:  Autumn Carson was seen today for diabetes.  Diagnoses and all orders for this visit:  Chronic insomnia  Controlled type 2 diabetes mellitus with complication, without long-term current use of insulin  [facility])    Patient has been counseled on age-appropriate routine health concerns for screening and prevention. These are reviewed and up-to-date. Referrals have been placed accordingly. Immunizations are up-to-date or declined.    Subjective:   Chief Complaint  Patient presents with  . Diabetes    DM f/u.  Dizziness when waking up - room spinning X2 weeks   History of Present Illness Autumn Carson is a 68 year old female with vertigo who presents with dizziness.  She experiences dizziness primarily when lying down at night, describing it as her head 'just swings around and around.' These episodes last about 20 minutes and occur every other night. Dizziness can also occur during the day, typically when she is sitting or walking around the house, but these instances are less frequent.  She has a history of vertigo but states that this current dizziness feels different. The dizziness started a little after she began working in a hot environment in May, which might be contributing to her symptoms. She drinks about three and a half bottles of water  a day, which is less than recommended for her weight.  No history of migraines and no episodes of passing out. She has not been checking her blood pressure during these episodes, although she has a blood pressure machine at home.  She is currently on hydralazine , which she takes twice a day, although it is prescribed three times a day. She is also on Ozempic  0.5 mg for weight management and reports that it helps curb her appetite.   ROS  Past Medical History:  Diagnosis Date  . Anemia   . Anxiety   . Arthritis of both knees   . Back pain   . Back pain   . Bilateral swelling of feet   . Cancer (HCC)     Thyroid   . Carpal tunnel syndrome    both wrists  . Chest pain   . Chronic anemia   . Congestive heart failure (CHF) (HCC)   . Constipation   . Depression 1998  . Essential hypertension   . Fatty liver   . GERD (gastroesophageal reflux disease)   . Glaucoma   . High cholesterol   . History of thyroid  cancer 2011   Papillary thyroid  cancer s/p radiation  . Hypertension   . Hypothyroidism   . Joint pain   . Lumbar spinal stenosis   . Obesity   . Osteoarthritis   . Palpitations   . PONV (postoperative nausea and vomiting)   . Rheumatoid arthritis (HCC)   . Seasonal allergies   . Swallowing difficulty   . Type 2 diabetes mellitus with diabetic neuropathy [facility])     Past Surgical History:  Procedure Laterality Date  . BACK SURGERY    . BIOPSY N/A 01/16/2013   Procedure: BIOPSY;  Surgeon: Margo LITTIE Haddock, MD;  [facility];  Service: Endoscopy;  Laterality: N/A;  SMALL BOWEL ULCERS  . BRAVO PH STUDY N/A 11/22/2015   Procedure: BRAVO PH STUDY;  Surgeon: Margo LITTIE Haddock, MD;  [facility];  Service: Endoscopy;  Laterality: N/A;  . BREAST BIOPSY Left 10/23/2022   US  LT BREAST BX W LOC DEV EA ADD LESION IMG BX SPEC US  GUIDE 10/23/2022 [facility]  . BREAST BIOPSY  Left 10/23/2022   US  LT BREAST BX W LOC DEV 1ST LESION IMG BX SPEC US  GUIDE 10/23/2022 [facility]  . BREAST BIOPSY Left 12/21/2022   US  LT RADIOACTIVE SEED LOC 12/21/2022 [facility]  . BREAST LUMPECTOMY WITH RADIOACTIVE SEED LOCALIZATION Left 12/25/2022   Procedure: LEFT BREAST LUMPECTOMY WITH RADIOACTIVE SEED LOCALIZATION;  Surgeon: Belinda Cough, MD;  Location: [facility] OR;  Service: General;  Laterality: Left;  . CARPAL TUNNEL RELEASE     4 total, bilaterally  . CESAREAN SECTION    . CHOLECYSTECTOMY  1990's  . COLONOSCOPY WITH ESOPHAGOGASTRODUODENOSCOPY (EGD) N/A 12/08/2012   SLF: 1. Normal mucosa in the terminal ileum 2. mild diverticulosis throughout the entire examined colon. 3.  rectal bleeding due to moderate sized internal hemorrhoids.   . ELBOW SURGERY Bilateral   . ENTEROSCOPY N/A 01/16/2013   Procedure: ENTEROSCOPY;  Surgeon: Margo LITTIE Haddock, MD;  [facility];  Service: Endoscopy;  Laterality: N/A;  2:00  . ESOPHAGOGASTRODUODENOSCOPY N/A 11/22/2015   Procedure: ESOPHAGOGASTRODUODENOSCOPY (EGD);  Surgeon: Margo LITTIE Haddock, MD;  [facility];  Service: Endoscopy;  Laterality: N/A;  130 - moved to 2:00 - office to notify pt  . FOOT SURGERY Left 1999   bone spur  . GIVENS CAPSULE STUDY N/A 12/31/2012   Procedure: GIVENS CAPSULE STUDY;  Surgeon: Margo LITTIE Haddock, MD;  [facility];  Service: Endoscopy;  Laterality: N/A;  7:30  . ROTATOR CUFF REPAIR Bilateral    bilaterally  . SAVORY DILATION N/A 11/22/2015   Procedure: SAVORY DILATION;  Surgeon: Margo LITTIE Haddock, MD;  [facility];  Service: Endoscopy;  Laterality: N/A;  . SPINE SURGERY     Lumbar Fusion- Dr. Malcolm  . TONSILECTOMY, ADENOIDECTOMY, BILATERAL MYRINGOTOMY AND TUBES  1987  . TONSILLECTOMY  1987  . TOTAL THYROIDECTOMY  2011  . TUBAL LIGATION      Family History  Problem Relation Age of Onset  . Stroke Mother   . Hyperlipidemia Mother   . Hypertension Mother   . Heart disease Mother   . Sudden death Mother   . Alcoholism Mother   . Cancer Father   . Heart disease Father   . Hyperlipidemia Father   . Lung cancer Father        Age 76  . Breast cancer Sister   . Heart attack Daughter 9  . Hypertension Daughter   . Breast cancer Cousin   . Colon cancer Brother        Age 76  . Other Brother        Agent orange, age 52, cancer    Social History Reviewed with no changes to be made today.   Outpatient Medications Prior to Visit  Medication Sig Dispense Refill  . amitriptyline  (ELAVIL ) 10 MG tablet TAKE 1 TABLET BY MOUTH AT  BEDTIME 30 tablet 11  . amLODipine  (NORVASC ) 10 MG tablet TAKE 1 TABLET BY MOUTH DAILY 100 tablet 2  . aspirin  81 MG tablet  Take 81 mg by mouth every other day.    . Blood Glucose Monitoring Suppl (TRUE METRIX AIR GLUCOSE METER) w/Device KIT USE AS DIRECTED 1 kit 0  . celecoxib  (CELEBREX ) 100 MG capsule TAKE 1 CAPSULE(100 MG) BY MOUTH TWICE DAILY AS NEEDED FOR MODERATE PAIN 60 capsule 2  . chlorthalidone  (HYGROTON ) 25 MG tablet TAKE 1 TABLET BY MOUTH DAILY 100 tablet 1  . diclofenac  Sodium (VOLTAREN ) 1 % GEL Apply 1 Application topically 2 (two) times daily.    [name]  hydrALAZINE  (APRESOLINE ) 10 MG tablet Take 1 tablet (10 mg total) by mouth 3 (three) times daily. 270 tablet 3  . HYDROcodone -acetaminophen  (NORCO/VICODIN) 5-325 MG tablet as needed for severe pain (pain score 7-10).    . levothyroxine  (SYNTHROID ) 175 MCG tablet TAKE 1 TABLET BY MOUTH DAILY  BEFORE BREAKFAST 100 tablet 2  . metFORMIN  (GLUCOPHAGE ) 1000 MG tablet TAKE 1 TABLET BY MOUTH TWICE  DAILY WITH MEALS 60 tablet 0  . nebivolol  (BYSTOLIC ) 10 MG tablet TAKE 1 TABLET BY MOUTH DAILY 100 tablet 1  . omeprazole  (PRILOSEC) 40 MG capsule Take 1 capsule (40 mg total) by mouth daily. 100 capsule 2  . Semaglutide ,0.25 or 0.5MG /DOS, (OZEMPIC , 0.25 OR 0.5 MG/DOSE,) 2 MG/3ML SOPN INJECT SUBCUTANEOUSLY 0.5 MG  EVERY WEEK 6 mL 1  . simvastatin  (ZOCOR ) 40 MG tablet daily at 6 PM.    . TRUE METRIX BLOOD GLUCOSE TEST test strip CHECK FINGER STICK BLOOD SUGAR TWICE DAILY AS DIRECTED 200 strip 3  . zolpidem  (AMBIEN ) 5 MG tablet TAKE 1 TABLET EVERY NIGHT AT BEDTIME AS NEEDED FOR SLEEP 30 tablet 3  . atorvastatin  (LIPITOR) 40 MG tablet Take 1 tablet (40 mg total) by mouth daily. (Patient not taking: Reported on 03/03/2024) 90 tablet 3  . ciclopirox  (PENLAC ) 8 % solution Apply one coat to toenail qd for 48 weeks.  Remove weekly with polish remover. (Patient taking differently: Apply 1 Application topically 3 (three) times a week.   Remove weekly with polish remover.) 6.6 mL 11   No facility-administered medications prior to visit.    Allergies  Allergen Reactions  . Lisinopril   Anaphylaxis    angioedema  . Flavoring Agent     Other Reaction(s): throat swelling (fruit/juice)       Objective:    LMP 09/07/2011  Wt Readings from Last 3 Encounters:  12/19/23 293 lb (132.9 kg)  12/02/23 295 lb 3.2 oz (133.9 kg)  09/02/23 (!) 315 lb 3.2 oz (143 kg)    Physical Exam       Patient has been counseled extensively about nutrition and exercise as well as the importance of adherence with medications and regular follow-up. The patient was given clear instructions to go to ER or return to medical center if symptoms don't improve, worsen or new problems develop. The patient verbalized understanding.   Follow-up: No follow-ups on file.   Haze LELON Servant, FNP-BC [facility] and [facility] [name], [place] [phone]   03/03/2024, 8:43 AM

## 2024-03-05 ENCOUNTER — Encounter: Payer: Self-pay | Admitting: Nurse Practitioner

## 2024-03-05 ENCOUNTER — Ambulatory Visit: Payer: Self-pay | Admitting: Nurse Practitioner

## 2024-03-05 MED ORDER — CEPHALEXIN 500 MG PO CAPS
500.0000 mg | ORAL_CAPSULE | Freq: Two times a day (BID) | ORAL | 0 refills | Status: AC
Start: 2024-03-05 — End: 2024-03-12

## 2024-03-12 ENCOUNTER — Telehealth: Payer: Self-pay

## 2024-03-12 NOTE — Telephone Encounter (Signed)
 Ordering provider: Dr. Santo Associated diagnoses:  I10 (hypertension) R40 ( Somnolence) R06.83 (Snoring) WatchPAT PA obtained on 03/12/2024 by Dena JAYSON Hesselbach, CMA. Authorization: No Pa required per [facility] provider portal  Decision ID #: [id number] Patient notified of PIN ([phone]) on 03/12/2024 via Notification Method: phone.  Phone note routed to covering staff for follow-up.

## 2024-03-15 ENCOUNTER — Encounter (INDEPENDENT_AMBULATORY_CARE_PROVIDER_SITE_OTHER): Admitting: Cardiology

## 2024-03-15 DIAGNOSIS — G4733 Obstructive sleep apnea (adult) (pediatric): Secondary | ICD-10-CM | POA: Diagnosis not present

## 2024-03-18 ENCOUNTER — Other Ambulatory Visit: Payer: Self-pay | Admitting: Family Medicine

## 2024-03-18 DIAGNOSIS — Z6841 Body Mass Index (BMI) 40.0 and over, adult: Secondary | ICD-10-CM

## 2024-03-18 DIAGNOSIS — Z7985 Long-term (current) use of injectable non-insulin antidiabetic drugs: Secondary | ICD-10-CM

## 2024-03-19 NOTE — Progress Notes (Unsigned)
 Cardiology Office Note   Date:  03/20/2024  ID:  MARSHALL ROEHRICH, DOB [birth year], MRN [id number] PCP: Theotis Haze ORN, NP  [facility] Providers Cardiologist:  Stanly DELENA Leavens, MD   History of Present Illness Autumn Carson is a 68 y.o. female with a past medical history significant for hyperlipidemia and hypertension who presented 12/19/2023 with atypical chest pain and a heart murmur.  She is here for follow-up appointment.  History includes experiencing chest discomfort since February or March of this year and described as a squeezing sensation in the chest that occasionally stings.  Discomfort occurs sporadically while sitting and can be triggered by exertion such as walking long distances or climbing stairs.  No dizziness, shortness of breath, palpitations, although her heart rate does increase with exertion.  Past cardiac evaluations included stress test and echocardiogram in 2017 which showed no significant issues.  Carotid ultrasound also showed no evidence of carotid artery stenosis.  Family history of heart disease with her mother passing from a heart attack at 65 father at 14 years old.  Although her father also had cancer.  Her daughter had a heart attack at 49 and cardiac arrest at 22.  Her brother also has heart issues.  Medication regimen includes atorvastatin  for hyperlipidemia, aspirin  81 mg, chlorthalidone , amlodipine , and Nebivolol  for blood pressure management and Ozempic  and metformin  for blood pressure control.  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  She does not engage in formal exercise and acknowledges that her work is physically demanding.  She also reports issues with sleep and takes medication for sleep and experiences snoring and waking during sleep.  Not been treated for sleep apnea.  Coronary CTA was ordered at that visit which showed a calcium  score of  0.Whole she had minimal soft plaque (less than 24%) at the ostial left main, otherwise patent.  She had mild stenosis (25 to 49%) C due to soft plaque at the distal RCA.  She did have some dilatation of the main pulmonary artery at 39 mm which may indicate and is suggestive for pulmonary hypertension.  Her echocardiogram showed a normal LVEF at 60 to 65%, no RWMA, moderate concentric LVH, normal diastolic parameters.  No significant valvular disease.  Trivial AI was thought to contribute to her murmur heard on exam.  Today, she presents with coronary artery disease who presents for follow-up after recent cardiac testing.  A recent CT scan shows mild coronary artery disease with less than 24% stenosis in the left main artery and 25-49% stenosis in the right coronary artery. Her calcium  score is zero. She is on Lipitor 40 mg at night, with an LDL cholesterol of 69 mg/dL in May.  An echocardiogram on July 9th indicates normal heart pump function (60-65%) and normal mitral and aortic valves, with some calcium  but no significant narrowing. Tricuspid and pulmonic valves are normal.  Her blood pressure is well-controlled at 130/70 mmHg. Current medications include amlodipine  10 mg, aspirin  daily, hydralazine  10 mg three times a day, and Bystolic  10 mg daily. A previous EKG showed a first-degree heart block, with no dropped beats or pauses in May.  She reports no recent chest pain or swelling in her feet.  Reports no shortness of breath nor dyspnea on exertion. Reports no chest pain, pressure, or tightness. No edema, orthopnea, PND. Reports no palpitations.   Discussed the use of AI scribe software for clinical note transcription with the patient, who  gave verbal consent to proceed.   Relevant histories: .  Social  - Mother passed away from heart attack - Father passed away from heart attack - Daughter had heart attack at 50, cardiac arrest at 58 - Older brother had tricuspid regurgitation - Sister  passed away from tricuspid regurgitation   ROS: pertinent ROS in HPI  Studies Reviewed      [facility] MULTI W/SPECT W 08/10/2015   Narrative  There was no ST segment deviation noted during stress.  The study is normal.  This is a low risk study.  Nuclear stress EF: 62%.   ECHOCARDIOGRAM   ECHOCARDIOGRAM COMPLETE 04/17/2016   Narrative *[place]* *[facility]* [address] [place], [place] [address] [phone]   ------------------------------------------------------------------- Transthoracic Echocardiography   Patient:    Gennett, Garcia MR #:       [id number] Study Date: 04/17/2016 Gender:     F Age:        60 Height:     167.6 cm Weight:     134.4 kg BSA:        2.58 m^2 Pt. Status: Room:       2C01C   SONOGRAPHER  Jon Cordella TISA Luke [place] [id number]    Xpf, Gjfzd [id number] ADMITTING    Franky Redia SAILOR ATTENDING    [place]anc[place] [id number] PERFORMING   [facility], Inpatient   cc:   ------------------------------------------------------------------- LV EF: 60% -   65%   ------------------------------------------------------------------- Indications:      Abnormal EKG 794.31.   ------------------------------------------------------------------- History:   PMH:  Obesity. Lumbar spinal stenosis. Hypothyroidism. Thyroid  cancer. Chronic anemia. Back pain.  Risk factors: Hypertension. Diabetes mellitus.   ------------------------------------------------------------------- Study Conclusions   - Left ventricle: The cavity size was moderately dilated. There was mild concentric hypertrophy. Systolic function was normal. The estimated ejection fraction was in the range of 60% to 65%. Wall motion was normal; there were no regional wall motion abnormalities. Left ventricular diastolic function parameters were normal. Doppler parameters are consistent with indeterminate ventricular filling pressure. - Aortic  valve: Transvalvular velocity was within the normal range. There was no stenosis. There was no regurgitation. - Mitral valve: Transvalvular velocity was within the normal range. There was no evidence for stenosis. There was no regurgitation. - Left atrium: The atrium was severely dilated. - Right ventricle: The cavity size was mildly dilated. Wall thickness was normal. Systolic function was normal. - Tricuspid valve: There was no regurgitation.   ------------------------------------------------------------------- Study data:   Study status:  Routine.  Procedure:  Transthoracic echocardiography. Image quality was adequate.  Study completion: There were no complications.          Transthoracic echocardiography.  M-mode, complete 2D, spectral Doppler, and color Doppler.  Birthdate:  Patient birthdate: 09-15-1955.  Age:  Patient is 68 yr old.  Sex:  Gender: female.    BMI: 47.8 kg/m^2.  Blood pressure:     146/69  Patient status:  Inpatient.  Study date: Study date: 04/17/2016. Study time: 02:43 PM.  Location:  ICU/CCU   -------------------------------------------------------------------   ------------------------------------------------------------------- Left ventricle:  The cavity size was moderately dilated. There was mild concentric hypertrophy. Systolic function was normal. The estimated ejection fraction was in the range of 60% to 65%. Wall motion was normal; there were no regional wall motion abnormalities. The transmitral flow pattern was normal. The deceleration time of the early transmitral flow velocity was normal. The pulmonary vein flow pattern was normal. The tissue  Doppler parameters were normal. Left ventricular diastolic function parameters were normal. Doppler parameters are consistent with indeterminate ventricular filling pressure.   ------------------------------------------------------------------- Aortic valve:   Trileaflet; normal thickness leaflets. Mobility  was not restricted.  Doppler:  Transvalvular velocity was within the normal range. There was no stenosis. There was no regurgitation.   ------------------------------------------------------------------- Aorta:  Aortic root: The aortic root was normal in size.   ------------------------------------------------------------------- Mitral valve:   Structurally normal valve.   Mobility was not restricted.  Doppler:  Transvalvular velocity was within the normal range. There was no evidence for stenosis. There was no regurgitation.    Peak gradient (D): 4 mm Hg.   ------------------------------------------------------------------- Left atrium:  The atrium was severely dilated.   ------------------------------------------------------------------- Right ventricle:  The cavity size was mildly dilated. Wall thickness was normal. Systolic function was normal.   ------------------------------------------------------------------- Pulmonic valve:    Structurally normal valve.   Cusp separation was normal.  Doppler:  Transvalvular velocity was within the normal range. There was no evidence for stenosis. There was no regurgitation.   ------------------------------------------------------------------- Tricuspid valve:   Structurally normal valve.    Doppler: Transvalvular velocity was within the normal range. There was no regurgitation.   ------------------------------------------------------------------- Pulmonary artery:   The main pulmonary artery was normal-sized.   ------------------------------------------------------------------- Right atrium:  The atrium was normal in size.   ------------------------------------------------------------------- Pericardium:  There was no pericardial effusion.   ------------------------------------------------------------------- Systemic veins: Inferior vena cava: The vessel was normal in size. The respirophasic diameter changes were in the normal  range (>= 50%), consistent with normal central venous pressure.  Physical Exam VS:  BP 130/70   Pulse 72   Ht 5' 5 (1.651 m)   Wt 285 lb 6.4 oz (129.5 kg)   LMP 09/07/2011   SpO2 95%   BMI 47.49 kg/m        Wt Readings from Last 3 Encounters:  03/20/24 285 lb 6.4 oz (129.5 kg)  03/03/24 284 lb (128.8 kg)  12/19/23 293 lb (132.9 kg)    GEN: Well nourished, well developed in no acute distress NECK: No JVD; No carotid bruits CARDIAC: RRR, no murmurs, rubs, gallops RESPIRATORY:  Clear to auscultation without rales, wheezing or rhonchi  ABDOMEN: Soft, non-tender, non-distended EXTREMITIES:  No edema; No deformity   ASSESSMENT AND PLAN  Coronary artery disease with mild stenosis CT scan showed mild stenosis in left main and right coronary arteries. Calcium  score zero. No recent chest pain. - Continue Lipitor 40 mg at night. - Monitor LDL levels every 6 months to a year. - Continue daily aspirin . - Follow up in 6 months with cardiologist unless symptoms arise.  Aortic valve calcification with associated heart murmur Echocardiogram showed mild aortic valve calcification without significant stenosis. Heart murmur likely due to calcium  deposits. Normal heart function.  First degree atrioventricular block EKG shows first degree heart block without dropped beats or pauses. Requires monitoring. - Perform EKG at least once a year.  Hypertension Blood pressure well-controlled at 130/70 mmHg. Current medication regimen effective. - Continue current antihypertensive regimen: amlodipine  10 mg, hydralazine  10 mg three times daily, and Bystolic  10 mg daily.  Hyperlipidemia LDL level 69 mg/dL in May, within target range. Current treatment with Lipitor effective. - Continue Lipitor 40 mg at night. - Monitor LDL levels every 6 months to a year.  Possible obstructive sleep apnea Sleep study conducted, results pending. Referral needed if positive. - Check for sleep study results. - If  positive, refer to Dr. Shlomo for  management and potential CPAP therapy.      Dispo: She can follow-up in 6 months with Dr. Santo   Signed, Orren LOISE Fabry, PA-C

## 2024-03-20 ENCOUNTER — Other Ambulatory Visit: Payer: Self-pay | Admitting: Nurse Practitioner

## 2024-03-20 ENCOUNTER — Encounter: Payer: Self-pay | Admitting: Nurse Practitioner

## 2024-03-20 ENCOUNTER — Ambulatory Visit: Attending: Physician Assistant | Admitting: Physician Assistant

## 2024-03-20 ENCOUNTER — Encounter: Payer: Self-pay | Admitting: Physician Assistant

## 2024-03-20 VITALS — BP 130/70 | HR 72 | Ht 65.0 in | Wt 285.4 lb

## 2024-03-20 DIAGNOSIS — R011 Cardiac murmur, unspecified: Secondary | ICD-10-CM | POA: Diagnosis not present

## 2024-03-20 DIAGNOSIS — E119 Type 2 diabetes mellitus without complications: Secondary | ICD-10-CM

## 2024-03-20 DIAGNOSIS — R072 Precordial pain: Secondary | ICD-10-CM

## 2024-03-20 DIAGNOSIS — I10 Essential (primary) hypertension: Secondary | ICD-10-CM

## 2024-03-20 DIAGNOSIS — I440 Atrioventricular block, first degree: Secondary | ICD-10-CM

## 2024-03-20 DIAGNOSIS — R400 Somnolence: Secondary | ICD-10-CM

## 2024-03-20 DIAGNOSIS — R079 Chest pain, unspecified: Secondary | ICD-10-CM

## 2024-03-20 MED ORDER — SEMAGLUTIDE (1 MG/DOSE) 4 MG/3ML ~~LOC~~ SOPN: 1.0000 mg | PEN_INJECTOR | SUBCUTANEOUS | 2 refills | Status: DC

## 2024-03-20 NOTE — Patient Instructions (Signed)
 Medication Instructions:  Your physician recommends that you continue on your current medications as directed. Please refer to the Current Medication list given to you today.  *If you need a refill on your cardiac medications before your next appointment, please call your pharmacy*  Follow-Up: At [facility], you and your health needs are our priority.  As part of our continuing mission to provide you with exceptional heart care, our providers are all part of one team.  This team includes your primary Cardiologist (physician) and Advanced Practice Providers or APPs (Physician Assistants and Nurse Practitioners) who all work together to provide you with the care you need, when you need it.  Your next appointment:   6 month(s)  Provider:   Stanly DELENA Leavens, MD  We recommend signing up for the patient portal called MyChart.  Sign up information is provided on this After Visit Summary.  MyChart is used to connect with patients for Virtual Visits (Telemedicine).  Patients are able to view lab/test results, encounter notes, upcoming appointments, etc.  Non-urgent messages can be sent to your provider as well.    To learn more about what you can do with MyChart, go to [email or web address].

## 2024-03-22 NOTE — Procedures (Signed)
     SLEEP STUDY REPORT Patient Information Study Date: 03/15/2024 Patient Name: Autumn Carson Patient ID: [id number] Birth Date: 11-01-1955 Age: 68 Gender: Female BMI: 48.9 (W=293 lb, H=5' 5'') Stopbang: 4 Referring Physician: Santo Kelly, MD  TEST DESCRIPTION: Home sleep apnea testing was completed using the WatchPat, a Type 1 device, utilizing peripheral arterial tonometry (PAT), chest movement, actigraphy, pulse oximetry, pulse rate, body position and snore. AHI was calculated with apnea and hypopnea using valid sleep time as the denominator. RDI includes apneas, hypopneas, and RERAs. The data acquired and the scoring of sleep and all associated events were performed in accordance with the recommended standards and specifications as outlined in the AASM Manual for the Scoring of Sleep and Associated Events 2.2.0 (2015).  FINDINGS:  1. Moderate Obstructive Sleep Apnea with AHI 16.8/hr overall and severe during REM sleep with REM AHI 49.7/hr..  2. No Central Sleep Apnea with pAHIc 0.4/hr.  3. Oxygen desaturations as low as 83%.  4. Severe snoring was present. O2 sats were < 88% for 6.2 min.  5. Total sleep time was 7 hrs and 9 min.  6. 15.3% of total sleep time was spent in REM sleep.  7. Shortened sleep onset latency at 6 min  8. Prolonged REM sleep onset latency at 103 min.  9. Total awakenings were 19. 10. Arrhythmia detection: None  DIAGNOSIS: Moderate Obstructive Sleep Apnea (G47.33) Nocturnal Hypoxemia  RECOMMENDATIONS: 1. Clinical correlation of these findings is necessary. The decision to treat obstructive sleep apnea (OSA) is usually based on the presence of apnea symptoms or the presence of associated medical conditions such as Hypertension, Congestive Heart Failure, Atrial Fibrillation or Obesity. The most common symptoms of OSA are snoring, gasping for breath while sleeping, daytime sleepiness and fatigue. 2. Initiating apnea therapy is  recommended given the presence of symptoms and/or associated conditions. Recommend proceeding with one of the following:  a. Auto-CPAP therapy with a pressure range of 5-20cm H2O.  b. An oral appliance (OA) that can be obtained from certain dentists with expertise in sleep medicine. These are primarily of use in non-obese patients with mild and moderate disease.  c. An ENT consultation which may be useful to look for specific causes of obstruction and possible treatment options.  d. If patient is intolerant to PAP therapy, consider referral to ENT for evaluation for hypoglossal nerve stimulator. 3. Close follow-up is necessary to ensure success with CPAP or oral appliance therapy for maximum benefit . 4. A follow-up oximetry study on CPAP is recommended to assess the adequacy of therapy and determine the need for supplemental oxygen or the potential need for Bi-level therapy. An arterial blood gas to determine the adequacy of baseline ventilation and oxygenation should also be considered. 5. Healthy sleep recommendations include: adequate nightly sleep (normal 7-9 hrs/night), avoidance of caffeine after noon and alcohol near bedtime, and maintaining a sleep environment that is cool, dark and quiet. 6. Weight loss for overweight patients is recommended. Even modest amounts of weight loss can significantly improve the severity of sleep apnea. 7. Snoring recommendations include: weight loss where appropriate, side sleeping, and avoidance of alcohol before bed. 8. Operation of motor vehicle should not be performed when sleepy.  Signature: Wilbert Bihari, MD; [facility]; Diplomat, American Board of Sleep Medicine Electronically Signed: 03/22/2024 9:39:44 PM

## 2024-03-23 ENCOUNTER — Ambulatory Visit: Attending: Internal Medicine

## 2024-03-23 DIAGNOSIS — R400 Somnolence: Secondary | ICD-10-CM

## 2024-04-01 NOTE — Telephone Encounter (Signed)
 Called pt reports completed sleep study 2 Monday's ago.  Advised pt I will send a message to our sleep team to follow up.

## 2024-04-06 ENCOUNTER — Other Ambulatory Visit: Payer: Self-pay | Admitting: Medical Genetics

## 2024-04-09 ENCOUNTER — Other Ambulatory Visit (HOSPITAL_COMMUNITY)
Admission: RE | Admit: 2024-04-09 | Discharge: 2024-04-09 | Disposition: A | Discharge status: Home / Self Care | Payer: Self-pay | Source: Ambulatory Visit | Attending: Medical Genetics | Admitting: Medical Genetics

## 2024-04-13 ENCOUNTER — Ambulatory Visit
Admission: RE | Admit: 2024-04-13 | Discharge: 2024-04-13 | Disposition: A | Discharge status: Home / Self Care | Source: Ambulatory Visit | Attending: Nurse Practitioner

## 2024-04-13 DIAGNOSIS — Z1231 Encounter for screening mammogram for malignant neoplasm of breast: Secondary | ICD-10-CM

## 2024-04-14 ENCOUNTER — Telehealth: Payer: Self-pay | Admitting: *Deleted

## 2024-04-14 DIAGNOSIS — I10 Essential (primary) hypertension: Secondary | ICD-10-CM

## 2024-04-14 DIAGNOSIS — G4733 Obstructive sleep apnea (adult) (pediatric): Secondary | ICD-10-CM

## 2024-04-14 DIAGNOSIS — R400 Somnolence: Secondary | ICD-10-CM

## 2024-04-14 NOTE — Telephone Encounter (Signed)
-----   Message from Wilbert Bihari sent at 03/22/2024  9:40 PM EDT ----- Please let patient know that they have sleep apnea.  Recommend therapeutic CPAP titration for treatment of patient's sleep disordered breathing.

## 2024-04-14 NOTE — Telephone Encounter (Signed)
 The patient has been notified of the result and verbalized understanding.  All questions (if any) were answered. Joshua Dalton Seip, CMA 04/14/2024 4:56 PM

## 2024-04-14 NOTE — Telephone Encounter (Addendum)
 The patient has been notified of the result and verbalized understanding.  All questions (if any) were answered. Joshua Dalton Seip, CMA 04/14/2024 4:55 PM     Prior Authorization for titration sent to [facility] via web portal. Tracking Number . READY-APPROVED-Decision ID #: [id number] Authorization/Notification is not required for the requested service(s

## 2024-04-16 ENCOUNTER — Other Ambulatory Visit: Payer: Self-pay | Admitting: Nurse Practitioner

## 2024-04-16 DIAGNOSIS — I10 Essential (primary) hypertension: Secondary | ICD-10-CM

## 2024-04-19 LAB — GENECONNECT MOLECULAR SCREEN: Genetic Analysis Overall Interpretation: NEGATIVE

## 2024-04-28 ENCOUNTER — Ambulatory Visit: Payer: 59

## 2024-05-03 ENCOUNTER — Other Ambulatory Visit: Payer: Self-pay | Admitting: Nurse Practitioner

## 2024-05-03 DIAGNOSIS — I10 Essential (primary) hypertension: Secondary | ICD-10-CM

## 2024-05-04 ENCOUNTER — Other Ambulatory Visit: Payer: Self-pay

## 2024-05-04 ENCOUNTER — Other Ambulatory Visit: Payer: Self-pay | Admitting: Pharmacist

## 2024-05-04 ENCOUNTER — Encounter: Payer: Self-pay | Admitting: Nurse Practitioner

## 2024-05-04 MED ORDER — ACCU-CHEK SOFTCLIX LANCETS MISC: 6 refills | Status: AC

## 2024-05-04 MED ORDER — ACCU-CHEK GUIDE TEST VI STRP: ORAL_STRIP | 6 refills | Status: AC

## 2024-05-04 MED ORDER — ACCU-CHEK GUIDE W/DEVICE KIT: PACK | 0 refills | Status: AC

## 2024-05-12 ENCOUNTER — Encounter: Payer: Self-pay | Admitting: Podiatry

## 2024-05-12 ENCOUNTER — Ambulatory Visit: Admitting: Podiatry

## 2024-05-12 DIAGNOSIS — M79675 Pain in left toe(s): Secondary | ICD-10-CM | POA: Diagnosis not present

## 2024-05-12 DIAGNOSIS — B351 Tinea unguium: Secondary | ICD-10-CM | POA: Diagnosis not present

## 2024-05-12 DIAGNOSIS — M79674 Pain in right toe(s): Secondary | ICD-10-CM | POA: Diagnosis not present

## 2024-05-12 DIAGNOSIS — E1143 Type 2 diabetes mellitus with diabetic autonomic (poly)neuropathy: Secondary | ICD-10-CM | POA: Diagnosis not present

## 2024-05-12 DIAGNOSIS — L84 Corns and callosities: Secondary | ICD-10-CM

## 2024-05-17 NOTE — Progress Notes (Signed)
  Subjective:  Patient ID: Autumn Carson, female    DOB: [birth year],  MRN: [id number]  Autumn Carson presents to clinic today for at risk foot care with history of diabetic neuropathy and callus(es) medial border left hallux and painful mycotic toenails that are difficult to trim. Painful toenails interfere with ambulation. Aggravating factors include wearing enclosed shoe gear. Pain is relieved with periodic professional debridement. Painful calluses are aggravated when weightbearing with and without shoegear. Pain is relieved with periodic professional debridement.  Chief Complaint  Patient presents with   Diabetes    Patient stated that her last A1c was 5.7, she last saw PCP in April and her PCP name is Haze Autumn    New problem(s): None.   PCP is Autumn Haze ORN, Autumn Carson.  Allergies  Allergen Reactions   Lisinopril  Anaphylaxis    angioedema   Flavoring Agent     Other Reaction(s): throat swelling (fruit/juice)    Review of Systems: Negative except as noted in the HPI.  Objective: No changes noted in today's physical examination. There were no vitals filed for this visit. Autumn Carson is a pleasant 68 y.o. female in NAD. AAO x 3.  Vascular Examination: Capillary refill time immediate b/l. Palpable pedal pulses. Pedal hair present b/l. No pain with calf compression b/l. Skin temperature gradient WNL b/l. No cyanosis or clubbing b/l. No ischemia or gangrene noted b/l. Nonpitting edema noted BLE.  Neurological Examination: Sensation grossly intact b/l with 10 gram monofilament. Vibratory sensation intact b/l. Pt has subjective symptoms of neuropathy.  Dermatological Examination: Pedal skin with normal turgor, texture and tone b/l.  No open wounds. No interdigital macerations.   Toenails 1-5 b/l thick, discolored, elongated with subungual debris and pain on dorsal palpation.   Hyperkeratotic lesion(s) medial border left hallux.  No erythema, no edema,  no drainage, no fluctuance.  Musculoskeletal Examination: Muscle strength 5/5 to all lower extremity muscle groups bilaterally. Pes planus deformity noted bilateral LE.[name] No pain, crepitus or joint limitation noted with ROM b/l LE.  Patient ambulates independently without assistive aids.  Radiographs: None  Assessment/Plan: 1. Pain due to onychomycosis of toenails of both feet   2. Callus   3. Type 2 diabetes mellitus with diabetic autonomic neuropathy, without long-term current use of insulin  (HCC)   -Patient was evaluated today. All questions/concerns addressed on today's visit. -Mycotic toenails 1-5 bilaterally were debrided in length and girth with sterile nail nippers and dremel without incident. -Callus(es) medial border left hallux pared utilizing sharp debridement with sterile blade without complication or incident. Total number debrided =1. Applied TAO to border. -Patient/POA to call should there be question/concern in the interim.   Return in about 3 months (around 08/12/2024).  Delon LITTIE Merlin, DPM      [facility] LOCATION: [address] [place] [address]                   Office [phone]   [facility] LOCATION: [address] [place], [place] [address] Office [phone]

## 2024-06-01 ENCOUNTER — Encounter: Payer: Self-pay | Admitting: Radiology

## 2024-06-01 ENCOUNTER — Other Ambulatory Visit: Payer: Self-pay | Admitting: Nurse Practitioner

## 2024-06-01 DIAGNOSIS — E119 Type 2 diabetes mellitus without complications: Secondary | ICD-10-CM

## 2024-06-03 ENCOUNTER — Ambulatory Visit: Attending: Nurse Practitioner | Admitting: Nurse Practitioner

## 2024-06-03 ENCOUNTER — Encounter: Payer: Self-pay | Admitting: Nurse Practitioner

## 2024-06-03 VITALS — BP 135/71 | HR 65 | Resp 18 | Ht 65.0 in | Wt 282.2 lb

## 2024-06-03 DIAGNOSIS — E119 Type 2 diabetes mellitus without complications: Secondary | ICD-10-CM

## 2024-06-03 DIAGNOSIS — I10 Essential (primary) hypertension: Secondary | ICD-10-CM

## 2024-06-03 DIAGNOSIS — Z7984 Long term (current) use of oral hypoglycemic drugs: Secondary | ICD-10-CM | POA: Diagnosis not present

## 2024-06-03 MED ORDER — SEMAGLUTIDE (2 MG/DOSE) 8 MG/3ML ~~LOC~~ SOPN: 2.0000 mg | PEN_INJECTOR | SUBCUTANEOUS | 2 refills | Status: DC

## 2024-06-03 NOTE — Progress Notes (Signed)
 Assessment & Plan:  Autumn Carson was seen today for hypertension.  Diagnoses and all orders for this visit:  Primary hypertension Blood pressure well-controlled with current medication regimen.   Diabetes mellitus treated with oral medication  -     Hemoglobin A1c -     CMP14+EGFR -     Semaglutide , 2 MG/DOSE, 8 MG/3ML SOPN; Inject 2 mg as directed once a week.    Patient has been counseled on age-appropriate routine health concerns for screening and prevention. These are reviewed and up-to-date. Referrals have been placed accordingly. Immunizations are up-to-date or declined.    Subjective:   Chief Complaint  Patient presents with   Hypertension    No questions or concerns.   History of Present Illness Autumn Carson is a 68 year old female with diabetes and hypertension who presents for routine follow-up and lab work.  She has a past medical history of Anxiety, primary osteo arthritis of both knees (followed by Ortho), Back pain, THYROID  Cancer with post surgical hypothyroidism (followed by Endocrinology Dr. Faythe), Chronic anemia, Depression (1998), Essential hypertension, GERD, History of thyroid  cancer (2011), Hypothyroidism, Lumbar spinal stenosis, MORBID Obesity, PONV, Seasonal allergies, and Type 2 diabetes mellitus with diabetic neuropathy.     HTN Blood pressure well-controlled with amlodipine  10 mg daily, hydralazine  10 mg 3 times daily, and labetalol 10 mg daily BP Readings from Last 3 Encounters:  06/03/24 135/71  03/20/24 130/70  03/03/24 (!) 155/73     She is scheduled for a sleep study next week   She uses an incentive spirometer twice a day to maintain lung function.  DM 2 A1c not quite at goal of less than 6.5.  We will increase Ozempic  from 1 mg to 2 mg weekly today Lab Results  Component Value Date   HGBA1C 6.9 12/02/2023     Review of Systems  Constitutional:  Negative for fever, malaise/fatigue and weight loss.  HENT:  Negative.  Negative for nosebleeds.   Eyes: Negative.  Negative for blurred vision, double vision and photophobia.  Respiratory: Negative.  Negative for cough and shortness of breath.   Cardiovascular: Negative.  Negative for chest pain, palpitations and leg swelling.  Gastrointestinal: Negative.  Negative for heartburn, nausea and vomiting.  Musculoskeletal: Negative.  Negative for myalgias.  Neurological: Negative.  Negative for dizziness, focal weakness, seizures and headaches.  Psychiatric/Behavioral: Negative.  Negative for suicidal ideas.     Past Medical History:  Diagnosis Date   Allergy    Anemia    Anxiety    Arrhythmia    Arthritis of both knees    Back pain    Back pain    Bilateral swelling of feet    Cancer (HCC)    Thyroid    Carpal tunnel syndrome    both wrists   Chest pain    Chronic anemia    Chronic kidney disease    Congestive heart failure (CHF) (HCC)    Constipation    Depression 1998   Essential hypertension    Fatty liver    GERD (gastroesophageal reflux disease)    Glaucoma    Heart murmur    High cholesterol    History of thyroid  cancer 2011   Papillary thyroid  cancer s/p radiation   Hypertension    Hypothyroidism    Joint pain    Lumbar spinal stenosis    Obesity    Osteoarthritis    Palpitations    PONV (postoperative nausea and vomiting)  Rheumatoid arthritis (HCC)    Seasonal allergies    Swallowing difficulty    Type 2 diabetes mellitus with diabetic neuropathy [facility])     Past Surgical History:  Procedure Laterality Date   BACK SURGERY     BIOPSY N/A 01/16/2013   Procedure: BIOPSY;  Surgeon: Margo LITTIE Haddock, MD;  [facility];  Service: Endoscopy;  Laterality: N/A;  SMALL BOWEL ULCERS   BRAVO PH STUDY N/A 11/22/2015   Procedure: BRAVO PH STUDY;  Surgeon: Margo LITTIE Haddock, MD;  Location: [facility];  Service: Endoscopy;  Laterality: N/A;   BREAST BIOPSY Left 10/23/2022   US  LT BREAST BX W LOC DEV EA ADD LESION IMG  BX SPEC US  GUIDE 10/23/2022 [facility]   BREAST BIOPSY Left 10/23/2022   US  LT BREAST BX W LOC DEV 1ST LESION IMG BX SPEC US  GUIDE 10/23/2022 [facility]   BREAST BIOPSY Left 12/21/2022   US  LT RADIOACTIVE SEED LOC 12/21/2022 [facility]   BREAST EXCISIONAL BIOPSY Left 12/25/2022   focal dilated mammary ducts   BREAST LUMPECTOMY WITH RADIOACTIVE SEED LOCALIZATION Left 12/25/2022   Procedure: LEFT BREAST LUMPECTOMY WITH RADIOACTIVE SEED LOCALIZATION;  Surgeon: Belinda Cough, MD;  Location: [facility];  Service: General;  Laterality: Left;   CARPAL TUNNEL RELEASE     4 total, bilaterally   CESAREAN SECTION     CHOLECYSTECTOMY  1990's   COLONOSCOPY WITH ESOPHAGOGASTRODUODENOSCOPY (EGD) N/A 12/08/2012   SLF: 1. Normal mucosa in the terminal ileum 2. mild diverticulosis throughout the entire examined colon. 3. rectal bleeding due to moderate sized internal hemorrhoids.    ELBOW SURGERY Bilateral    ENTEROSCOPY N/A 01/16/2013   Procedure: ENTEROSCOPY;  Surgeon: Margo LITTIE Haddock, MD;  [facility];  Service: Endoscopy;  Laterality: N/A;  2:00   ESOPHAGOGASTRODUODENOSCOPY N/A 11/22/2015   Procedure: ESOPHAGOGASTRODUODENOSCOPY (EGD);  Surgeon: Margo LITTIE Haddock, MD;  [facility];  Service: Endoscopy;  Laterality: N/A;  130 - moved to 2:00 - office to notify pt   FOOT SURGERY Left 1999   bone spur   GIVENS CAPSULE STUDY N/A 12/31/2012   Procedure: GIVENS CAPSULE STUDY;  Surgeon: Margo LITTIE Haddock, MD;  [facility];  Service: Endoscopy;  Laterality: N/A;  7:30   ROTATOR CUFF REPAIR Bilateral    bilaterally   SAVORY DILATION N/A 11/22/2015   Procedure: SAVORY DILATION;  Surgeon: Margo LITTIE Haddock, MD;  Location: [facility];  Service: Endoscopy;  Laterality: N/A;   SPINE SURGERY     Lumbar Fusion- Dr. Malcolm   TONSILECTOMY, ADENOIDECTOMY, BILATERAL MYRINGOTOMY AND TUBES  1987   TONSILLECTOMY  1987   TOTAL THYROIDECTOMY  2011   TUBAL LIGATION      Family  History  Problem Relation Age of Onset   Stroke Mother    Hyperlipidemia Mother    Hypertension Mother    Heart disease Mother    Sudden death Mother    Alcoholism Mother    Anemia Mother    Heart attack Mother    Heart failure Mother    Alcohol abuse Mother    Cancer Father    Heart disease Father    Hyperlipidemia Father    Lung cancer Father        Age 43   Alcohol abuse Father    Breast cancer Sister    Heart attack Daughter 18   Hypertension Daughter    Breast cancer Cousin    Colon cancer Brother  Age 79   Arthritis Brother    Obesity Brother    Other Brother        Designer, fashion/clothing, age 67, cancer   Alcohol abuse Brother    Diabetes Maternal Grandfather     Social History Reviewed with no changes to be made today.   Outpatient Medications Prior to Visit  Medication Sig Dispense Refill   Accu-Chek Softclix Lancets lancets Use to check blood sugar 3 times daily. 100 each 6   amitriptyline  (ELAVIL ) 10 MG tablet TAKE 1 TABLET BY MOUTH AT  BEDTIME 30 tablet 11   amLODipine  (NORVASC ) 10 MG tablet TAKE 1 TABLET BY MOUTH DAILY 100 tablet 2   aspirin  81 MG tablet Take 81 mg by mouth every other day.     atorvastatin  (LIPITOR) 40 MG tablet Take 1 tablet (40 mg total) by mouth daily. FOR CHOLESTEROL 100 tablet 2   Blood Glucose Monitoring Suppl (ACCU-CHEK GUIDE) w/Device KIT Use to check blood sugar 3 times daily. 1 kit 0   celecoxib  (CELEBREX ) 100 MG capsule TAKE 1 CAPSULE(100 MG) BY MOUTH TWICE DAILY AS NEEDED FOR MODERATE PAIN 60 capsule 2   chlorthalidone  (HYGROTON ) 25 MG tablet TAKE 1 TABLET BY MOUTH DAILY 100 tablet 2   diclofenac  Sodium (VOLTAREN ) 1 % GEL Apply 1 Application topically 2 (two) times daily.     FLUoxetine  (PROZAC ) 40 MG capsule Take 40 mg by mouth daily.     glucose blood (ACCU-CHEK GUIDE TEST) test strip Use to check blood sugar 3 times daily. 100 each 6   hydrALAZINE  (APRESOLINE ) 10 MG tablet Take 1 tablet (10 mg total) by mouth 3 (three) times  daily. 270 tablet 3   levothyroxine  (SYNTHROID ) 175 MCG tablet TAKE 1 TABLET BY MOUTH DAILY  BEFORE BREAKFAST 100 tablet 2   meclizine  (ANTIVERT ) 12.5 MG tablet Take 1 tablet (12.5 mg total) by mouth 3 (three) times daily as needed for dizziness. 30 tablet 0   metFORMIN  (GLUCOPHAGE ) 1000 MG tablet Take 1 tablet (1,000 mg total) by mouth 2 (two) times daily with a meal. 180 tablet 1   nebivolol  (BYSTOLIC ) 10 MG tablet TAKE 1 TABLET BY MOUTH DAILY 100 tablet 2   omeprazole  (PRILOSEC) 40 MG capsule Take 1 capsule (40 mg total) by mouth daily. 100 capsule 2   simvastatin  (ZOCOR ) 40 MG tablet daily at 6 PM.     zolpidem  (AMBIEN ) 5 MG tablet TAKE 1 TABLET EVERY NIGHT AT BEDTIME AS NEEDED FOR SLEEP 30 tablet 3   HYDROcodone -acetaminophen  (NORCO/VICODIN) 5-325 MG tablet as needed for severe pain (pain score 7-10).     Semaglutide , 1 MG/DOSE, (OZEMPIC , 1 MG/DOSE,) 4 MG/3ML SOPN INJECT SUBCUTANEOUSLY 1 MG EVERY WEEK AS DIRECTED 9 mL 1   No facility-administered medications prior to visit.    Allergies  Allergen Reactions   Lisinopril  Anaphylaxis    angioedema   Flavoring Agent     Other Reaction(s): throat swelling (fruit/juice)       Objective:    BP 135/71 (BP Location: Left Arm, Patient Position: Sitting, Cuff Size: Normal)   Pulse 65   Resp 18   Ht 5' 5 (1.651 m)   Wt 282 lb 3.2 oz (128 kg)   LMP 09/07/2011   SpO2 98%   BMI 46.96 kg/m  Wt Readings from Last 3 Encounters:  06/03/24 282 lb 3.2 oz (128 kg)  03/20/24 285 lb 6.4 oz (129.5 kg)  03/03/24 284 lb (128.8 kg)    Physical Exam Vitals and nursing note  reviewed.  Constitutional:      Appearance: She is well-developed.  HENT:     Head: Normocephalic and atraumatic.  Cardiovascular:     Rate and Rhythm: Normal rate and regular rhythm.     Heart sounds: Normal heart sounds. No murmur heard.    No friction rub. No gallop.  Pulmonary:     Effort: Pulmonary effort is normal. No tachypnea or respiratory distress.     Breath  sounds: Normal breath sounds. No decreased breath sounds, wheezing, rhonchi or rales.  Chest:     Chest wall: No tenderness.  Musculoskeletal:        General: Normal range of motion.     Cervical back: Normal range of motion.  Skin:    General: Skin is warm and dry.  Neurological:     Mental Status: She is alert and oriented to person, place, and time.     Coordination: Coordination normal.  Psychiatric:        Behavior: Behavior normal. Behavior is cooperative.        Thought Content: Thought content normal.        Judgment: Judgment normal.          Patient has been counseled extensively about nutrition and exercise as well as the importance of adherence with medications and regular follow-up. The patient was given clear instructions to go to ER or return to medical center if symptoms don't improve, worsen or new problems develop. The patient verbalized understanding.   Follow-up: Return in about 4 months (around 10/01/2024).   Haze LELON Servant, FNP-BC [facility] [place], [place] [phone]   06/03/2024, 11:17 AM

## 2024-06-03 NOTE — Patient Instructions (Signed)
 [name]

## 2024-06-04 LAB — CMP14+EGFR
ALT: 11 IU/L (ref 0–32)
AST: 15 IU/L (ref 0–40)
Albumin: 4.4 g/dL (ref 3.9–4.9)
Alkaline Phosphatase: 90 IU/L (ref 49–135)
BUN/Creatinine Ratio: 11 — ABNORMAL LOW (ref 12–28)
BUN: 12 mg/dL (ref 8–27)
Bilirubin Total: 0.3 mg/dL (ref 0.0–1.2)
CO2: 25 mmol/L (ref 20–29)
Calcium: 9.4 mg/dL (ref 8.7–10.3)
Chloride: 101 mmol/L (ref 96–106)
Creatinine, Ser: 1.06 mg/dL — ABNORMAL HIGH (ref 0.57–1.00)
Globulin, Total: 2.3 g/dL (ref 1.5–4.5)
Glucose: 97 mg/dL (ref 70–99)
Potassium: 4 mmol/L (ref 3.5–5.2)
Sodium: 142 mmol/L (ref 134–144)
Total Protein: 6.7 g/dL (ref 6.0–8.5)
eGFR: 58 mL/min/1.73 — ABNORMAL LOW (ref >59)

## 2024-06-04 LAB — HEMOGLOBIN A1C
Est. average glucose Bld gHb Est-mCnc: 126 mg/dL
Hgb A1c MFr Bld: 6 % — ABNORMAL HIGH (ref 4.8–5.6)

## 2024-06-08 ENCOUNTER — Ambulatory Visit (HOSPITAL_BASED_OUTPATIENT_CLINIC_OR_DEPARTMENT_OTHER): Attending: Cardiology | Admitting: Cardiology

## 2024-06-08 DIAGNOSIS — I10 Essential (primary) hypertension: Secondary | ICD-10-CM

## 2024-06-08 DIAGNOSIS — R400 Somnolence: Secondary | ICD-10-CM

## 2024-06-08 DIAGNOSIS — G4733 Obstructive sleep apnea (adult) (pediatric): Secondary | ICD-10-CM | POA: Insufficient documentation

## 2024-06-09 NOTE — Procedures (Signed)
 Indications for Polysomnography The patient is a 68 year old Female who is 5' 6 and weighs 279.0 lbs. Her BMI equals 45.4.  A full night titration treatment study was performed.  No medications were reported taken during the night.No Data. Polysomnogram Data A full night polysomnogram recorded the standard physiologic parameters including EEG, EOG, EMG, EKG, nasal and oral airflow.  Respiratory parameters of chest and abdominal movements were recorded with Respiratory Inductance Plethysmography belts.   Oxygen saturation was recorded by pulse oximetry.  Sleep Architecture The total recording time of the polysomnogram was 409.3 minutes.  The total sleep time was 204.0 minutes.  The patient spent 31.1% of total sleep time in Stage N1, 68.4% in Stage N2, 0.5% in Stages N3, and 0.0% in REM.  Sleep latency was 37.3 minutes.   REM latency was - minutes.  Sleep Efficiency was 49.8%.  Wake after Sleep Onset time was 167.5 minutes.  Titration Summary The patient was titrated at pressures ranging from 6 cm/H20 up to 20 cm/H20. The last pressure used in the study was 20 cm/H20.  Respiratory Events The polysomnogram revealed a presence of 0 obstructive, 0 central, and 0 mixed apneas resulting in an Apnea index of 0 events per hour.  There were 43 hypopneas (GreaterEqual to3% desaturation and/or arousal) resulting in an Apnea\Hypopnea Index (AHI  GreaterEqual to3% desaturation and/or arousal) of 12.6 events per hour.  There were 10 hypopneas (GreaterEqual to4% desaturation) resulting in an Apnea\Hypopnea Index (AHI GreaterEqual to4% desaturation) of 2.9 events per hour.  There were 119  Respiratory Effort Related Arousals resulting in a RERA index of 35.0 events per hour. The Respiratory Disturbance Index is 47.6 events per hour.  The snore index was 0 events per hour.  Mean oxygen saturation was 95.2%.  The lowest oxygen saturation during sleep was 88.0%.  Time spent LessEqual to88% oxygen saturation was  0 minutes.  Limb Activity There were 25 limb movements recorded.  Of this total, 25 were classified as PLMs.  Of the PLMs, 1 were associated with arousals.  The Limb Movement index was 7.4 per hour while the PLM index was 7.4 per hour.  Cardiac Summary The average pulse rate was 62.5 bpm.  The minimum pulse rate was 53.0 bpm while the maximum pulse rate was 82.0 bpm.  Cardiac rhythm was NSR with PVCs.   Diagnosis: Obstructive Sleep Apnea Successful CPAP Titration     Recommendations: 1. Recommend a trial of ResMed CPAP at 16cm H2O and EPR 2cm H2O, heated humidity and large [employer] Evora full face mask. 2. Close follow-up is necessary to ensure success with CPAP or oral appliance therapy for maximum benefit. 3. A follow-up oximetry study on CPAP is recommended to assess the adequacy of therapy and determine the need for supplemental oxygen or the potential need for Bi-level therapy.  An arterial blood gas to determine the adequacy of baseline ventilation and  oxygenation should also be considered. 4. Healthy sleep recommendations include:  adequate nightly sleep (normal 7-9 hrs/night), avoidance of caffeine after noon and alcohol near bedtime, and maintaining a sleep environment that is cool, dark and quiet. 5. Weight loss for overweight patients is recommended.  Even modest amounts of weight loss can significantly improve the severity of sleep apnea. 6. Snoring recommendations include:  weight loss where appropriate, side sleeping, and avoidance of alcohol before bed. 7. Operation of motor vehicle should be avoided when sleepy.   This study was personally reviewed and electronically signed by: Dr. Wilbert Bihari Accredited  Board Certified in Sleep Medicine Date/Time: 06/08/2024 2:16PM

## 2024-06-19 ENCOUNTER — Telehealth: Payer: Self-pay | Admitting: *Deleted

## 2024-06-19 NOTE — Telephone Encounter (Signed)
 The patient has been notified of the result. Left detailed message on voicemail and in her mychart and informed patient to call back.

## 2024-06-19 NOTE — Telephone Encounter (Signed)
 Return call: The patient has been notified of the result and verbalized understanding.  All questions (if any) were answered. Joshua Dalton Seip, CMA 06/19/2024 11:36 AM    Upon patient request DME selection is [facility]. Patient understands he will be contacted by [facility] to set up his cpap. Patient understands to call if [facility] does not contact him with new setup in a timely manner. Patient understands they will be called once confirmation has been received from [facility]/ that they have received their new machine to schedule 10 week follow up appointment.   [facility] notified of new cpap order  Please add to airview Patient was grateful for the call and thanked me.

## 2024-06-19 NOTE — Telephone Encounter (Signed)
-----   Message from Wilbert Bihari sent at 06/09/2024  2:18 PM EST ----- Please let patient know that they had a successful PAP titration and let DME know that orders are in EPIC.  Please set up 6 week OV with me.

## 2024-06-28 ENCOUNTER — Ambulatory Visit: Payer: Self-pay | Admitting: Nurse Practitioner

## 2024-06-30 ENCOUNTER — Ambulatory Visit: Attending: Nurse Practitioner

## 2024-06-30 VITALS — Ht 65.0 in | Wt 276.0 lb

## 2024-06-30 DIAGNOSIS — Z0000 Encounter for general adult medical examination without abnormal findings: Secondary | ICD-10-CM

## 2024-06-30 NOTE — Patient Instructions (Signed)
 Ms. Autumn Carson,  Thank you for taking the time for your Medicare Wellness Visit. I appreciate your continued commitment to your health goals. Please review the care plan we discussed, and feel free to reach out if I can assist you further.  Please note that Annual Wellness Visits do not include a physical exam. Some assessments may be limited, especially if the visit was conducted virtually. If needed, we may recommend an in-person follow-up with your provider.  Ongoing Care Seeing your primary care provider every 3 to 6 months helps us  monitor your health and provide consistent, personalized care.   Referrals If a referral was made during today's visit and you haven't received any updates within two weeks, please contact the referred provider directly to check on the status.  Recommended Screenings:  Health Maintenance  Topic Date Due   Medicare Annual Wellness Visit  02/13/2024   Yearly kidney health urinalysis for diabetes  09/01/2024   Eye exam for diabetics  10/16/2024   COVID-19 Vaccine (5 - Moderna risk 2025-26 season) 10/25/2024   Hemoglobin A1C  12/01/2024   Complete foot exam   01/26/2025   Yearly kidney function blood test for diabetes  06/03/2025   Breast Cancer Screening  04/13/2026   Cologuard (Stool DNA test)  05/15/2026   DTaP/Tdap/Td vaccine (2 - Td or Tdap) 05/02/2031   Pneumococcal Vaccine for age over 84  Completed   Flu Shot  Completed   Osteoporosis screening with Bone Density Scan  Completed   Hepatitis C Screening  Completed   Zoster (Shingles) Vaccine  Completed   Meningitis B Vaccine  Aged Out   Hepatitis B Vaccine  Discontinued   Colon Cancer Screening  Discontinued       06/30/2024    2:21 PM  Advanced Directives  Does Patient Have a Medical Advance Directive? Yes  Type of Advance Directive Living will    Vision: Annual vision screenings are recommended for early detection of glaucoma, cataracts, and diabetic retinopathy. These exams can also  reveal signs of chronic conditions such as diabetes and high blood pressure.  Dental: Annual dental screenings help detect early signs of oral cancer, gum disease, and other conditions linked to overall health, including heart disease and diabetes.  Please see the attached documents for additional preventive care recommendations.

## 2024-06-30 NOTE — Telephone Encounter (Signed)
 Patient stated during AWV that she was not sleeping very much and would like a rx to be sent to her pharmacy on file for Zolpidem  5 mg tablet.  Sherida Dobkins N. Tomie, LPN [employer]

## 2024-06-30 NOTE — Progress Notes (Signed)
 I connected with  Autumn Carson on 06/30/24 by a audio enabled telemedicine application and verified that I am speaking with the correct person using two identifiers.  Patient Location: Home  Provider Location: Home Office  Persons Participating in Visit: Patient.  I discussed the limitations of evaluation and management by telemedicine. The patient expressed understanding and agreed to proceed.   Vital Signs: Because this visit was a virtual/telehealth visit, some criteria may be missing or patient reported. Any vitals not documented were not able to be obtained and vitals that have been documented are patient reported.     Chief Complaint  Patient presents with   Medicare Wellness    SUBSEQUENT     Subjective:   Autumn Carson is a 68 y.o. female who presents for a Medicare Annual Wellness Visit.  Visit info / Clinical Intake: Medicare Wellness Visit Type:: Subsequent Annual Wellness Visit Persons participating in visit and providing information:: patient Medicare Wellness Visit Mode:: Telephone If telephone:: video declined Since this visit was completed virtually, some vitals may be partially provided or unavailable. Missing vitals are due to the limitations of the virtual format.: Documented vitals are patient reported If Telephone or Video please confirm:: I connected with patient using audio/video enable telemedicine. I verified patient identity with two identifiers, discussed telehealth limitations, and patient agreed to proceed. Patient Location:: HOME Provider Location:: HOME OFFICE Interpreter Needed?: No Pre-visit prep was completed: yes AWV questionnaire completed by patient prior to visit?: no Living arrangements:: with family/others (BROTHER LIVES WITH HER) Patient's Overall Health Status Rating: very good (I AM SO PROUD OF MY WT LOSS PROCESS AND HGA1C) Typical amount of pain: none Does pain affect daily life?: no Are you currently  prescribed opioids?: no  Dietary Habits and Nutritional Risks How many meals a day?: 3 (SMALL PORTIONS) Eats fruit and vegetables daily?: yes Most meals are obtained by: preparing own meals In the last 2 weeks, have you had any of the following?: none Diabetic:: (!) yes Any non-healing wounds?: no How often do you check your BS?: 2 (MORNING & NIGHT) Would you like to be referred to a Nutritionist or for Diabetic Management? : no  Functional Status Activities of Daily Living (to include ambulation/medication): Independent Ambulation: Independent with device- listed below Home Assistive Devices/Equipment: [place]; Eyeglasses Medication Administration: Independent Home Management (perform basic housework or laundry): Independent Manage your own finances?: yes Primary transportation is: driving Concerns about vision?: no *vision screening is required for WTM* Concerns about hearing?: no  Fall Screening Falls in the past year?: 0 Number of falls in past year: 0 Was there an injury with Fall?: 0 Fall Risk Category Calculator: 0 Patient Fall Risk Level: Low Fall Risk  Fall Risk Patient at Risk for Falls Due to: Impaired balance/gait; No Fall Risks Fall risk Follow up: Falls evaluation completed; Education provided  Home and Transportation Safety: All rugs have non-skid backing?: N/A, no rugs All stairs or steps have railings?: yes (FRONT ENTRANCE) Grab bars in the bathtub or shower?: (!) no Have non-skid surface in bathtub or shower?: yes Good home lighting?: yes Regular seat belt use?: yes Hospital stays in the last year:: no  Cognitive Assessment Difficulty concentrating, remembering, or making decisions? : no Will 6CIT or Mini Cog be Completed: no 6CIT or Mini Cog Declined: patient alert, oriented, able to answer questions appropriately and recall recent events  Advance Directives (For Healthcare) Does Patient Have a Medical Advance Directive?: Yes Does patient want to make  changes  to medical advance directive?: No - Patient declined Type of Advance Directive: Living will Copy of Healthcare Power of Attorney in Chart?: No - copy requested Copy of Living Will in Chart?: No - copy requested  Reviewed/Updated  Reviewed/Updated: Reviewed All (Medical, Surgical, Family, Medications, Allergies, Care Teams, Patient Goals)    Allergies (verified) Lisinopril  and Flavoring agent   Current Medications (verified) Outpatient Encounter Medications as of 06/30/2024  Medication Sig   Accu-Chek Softclix Lancets lancets Use to check blood sugar 3 times daily.   amitriptyline  (ELAVIL ) 10 MG tablet TAKE 1 TABLET BY MOUTH AT  BEDTIME   amLODipine  (NORVASC ) 10 MG tablet TAKE 1 TABLET BY MOUTH DAILY   aspirin  81 MG tablet Take 81 mg by mouth every other day.   atorvastatin  (LIPITOR) 40 MG tablet Take 1 tablet (40 mg total) by mouth daily. FOR CHOLESTEROL   Blood Glucose Monitoring Suppl (ACCU-CHEK GUIDE) w/Device KIT Use to check blood sugar 3 times daily.   celecoxib  (CELEBREX ) 100 MG capsule TAKE 1 CAPSULE(100 MG) BY MOUTH TWICE DAILY AS NEEDED FOR MODERATE PAIN   chlorthalidone  (HYGROTON ) 25 MG tablet TAKE 1 TABLET BY MOUTH DAILY   diclofenac  Sodium (VOLTAREN ) 1 % GEL Apply 1 Application topically 2 (two) times daily.   FLUoxetine  (PROZAC ) 40 MG capsule Take 40 mg by mouth daily.   glucose blood (ACCU-CHEK GUIDE TEST) test strip Use to check blood sugar 3 times daily.   hydrALAZINE  (APRESOLINE ) 10 MG tablet Take 1 tablet (10 mg total) by mouth 3 (three) times daily.   levothyroxine  (SYNTHROID ) 175 MCG tablet TAKE 1 TABLET BY MOUTH DAILY  BEFORE BREAKFAST   meclizine  (ANTIVERT ) 12.5 MG tablet Take 1 tablet (12.5 mg total) by mouth 3 (three) times daily as needed for dizziness.   metFORMIN  (GLUCOPHAGE ) 1000 MG tablet Take 1 tablet (1,000 mg total) by mouth 2 (two) times daily with a meal.   nebivolol  (BYSTOLIC ) 10 MG tablet TAKE 1 TABLET BY MOUTH DAILY   omeprazole  (PRILOSEC)  40 MG capsule Take 1 capsule (40 mg total) by mouth daily.   Semaglutide , 2 MG/DOSE, 8 MG/3ML SOPN Inject 2 mg as directed once a week.   simvastatin  (ZOCOR ) 40 MG tablet daily at 6 PM.   zolpidem  (AMBIEN ) 5 MG tablet TAKE 1 TABLET EVERY NIGHT AT BEDTIME AS NEEDED FOR SLEEP   No facility-administered encounter medications on file as of 06/30/2024.    History: Past Medical History:  Diagnosis Date   Allergy    Anemia    Anxiety    Arrhythmia    Arthritis of both knees    Back pain    Back pain    Bilateral swelling of feet    Cancer (HCC)    Thyroid    Carpal tunnel syndrome    both wrists   Chest pain    Chronic anemia    Chronic kidney disease    Congestive heart failure (CHF) (HCC)    Constipation    Depression 1998   Essential hypertension    Fatty liver    GERD (gastroesophageal reflux disease)    Glaucoma    Heart murmur    High cholesterol    History of thyroid  cancer 2011   Papillary thyroid  cancer s/p radiation   Hypertension    Hypothyroidism    Joint pain    Lumbar spinal stenosis    Obesity    Osteoarthritis    Palpitations    PONV (postoperative nausea and vomiting)    Rheumatoid arthritis (HCC)  Seasonal allergies    Swallowing difficulty    Type 2 diabetes mellitus with diabetic neuropathy [facility])    Past Surgical History:  Procedure Laterality Date   BACK SURGERY     BIOPSY N/A 01/16/2013   Procedure: BIOPSY;  Surgeon: Margo LITTIE Haddock, MD;  [facility];  Service: Endoscopy;  Laterality: N/A;  SMALL BOWEL ULCERS   BRAVO PH STUDY N/A 11/22/2015   Procedure: BRAVO PH STUDY;  Surgeon: Margo LITTIE Haddock, MD;  [facility];  Service: Endoscopy;  Laterality: N/A;   BREAST BIOPSY Left 10/23/2022   US  LT BREAST BX W LOC DEV EA ADD LESION IMG BX SPEC US  GUIDE 10/23/2022 [facility]   BREAST BIOPSY Left 10/23/2022   US  LT BREAST BX W LOC DEV 1ST LESION IMG BX SPEC US  GUIDE 10/23/2022 [facility]   BREAST BIOPSY Left  12/21/2022   US  LT RADIOACTIVE SEED LOC 12/21/2022 [facility]   BREAST EXCISIONAL BIOPSY Left 12/25/2022   focal dilated mammary ducts   BREAST LUMPECTOMY WITH RADIOACTIVE SEED LOCALIZATION Left 12/25/2022   Procedure: LEFT BREAST LUMPECTOMY WITH RADIOACTIVE SEED LOCALIZATION;  Surgeon: Belinda Cough, MD;  Location: [facility];  Service: General;  Laterality: Left;   CARPAL TUNNEL RELEASE     4 total, bilaterally   CESAREAN SECTION     CHOLECYSTECTOMY  1990's   COLONOSCOPY WITH ESOPHAGOGASTRODUODENOSCOPY (EGD) N/A 12/08/2012   SLF: 1. Normal mucosa in the terminal ileum 2. mild diverticulosis throughout the entire examined colon. 3. rectal bleeding due to moderate sized internal hemorrhoids.    ELBOW SURGERY Bilateral    ENTEROSCOPY N/A 01/16/2013   Procedure: ENTEROSCOPY;  Surgeon: Margo LITTIE Haddock, MD;  [facility];  Service: Endoscopy;  Laterality: N/A;  2:00   ESOPHAGOGASTRODUODENOSCOPY N/A 11/22/2015   Procedure: ESOPHAGOGASTRODUODENOSCOPY (EGD);  Surgeon: Margo LITTIE Haddock, MD;  [facility];  Service: Endoscopy;  Laterality: N/A;  130 - moved to 2:00 - office to notify pt   FOOT SURGERY Left 1999   bone spur   GIVENS CAPSULE STUDY N/A 12/31/2012   Procedure: GIVENS CAPSULE STUDY;  Surgeon: Margo LITTIE Haddock, MD;  [facility];  Service: Endoscopy;  Laterality: N/A;  7:30   ROTATOR CUFF REPAIR Bilateral    bilaterally   SAVORY DILATION N/A 11/22/2015   Procedure: SAVORY DILATION;  Surgeon: Margo LITTIE Haddock, MD;  [facility];  Service: Endoscopy;  Laterality: N/A;   SPINE SURGERY     Lumbar Fusion- Dr. Malcolm   TONSILECTOMY, ADENOIDECTOMY, BILATERAL MYRINGOTOMY AND TUBES  1987   TONSILLECTOMY  1987   TOTAL THYROIDECTOMY  2011   TUBAL LIGATION     Family History  Problem Relation Age of Onset   Stroke Mother    Hyperlipidemia Mother    Hypertension Mother    Heart disease Mother    Sudden death Mother    Alcoholism Mother    Anemia  Mother    Heart attack Mother    Heart failure Mother    Alcohol abuse Mother    Cancer Father    Heart disease Father    Hyperlipidemia Father    Lung cancer Father        Age 45   Alcohol abuse Father    Breast cancer Sister    Heart attack Daughter 37   Hypertension Daughter    Breast cancer Cousin    Colon cancer Brother        Age 71  Arthritis Brother    Obesity Brother    Other Brother        Designer, fashion/clothing, age 21, cancer   Alcohol abuse Brother    Diabetes Maternal Grandfather    Social History   Occupational History   Occupation: Disabled    Employer: UNEMPLOYED  Tobacco Use   Smoking status: Never    Passive exposure: Past   Smokeless tobacco: Never  Vaping Use   Vaping status: Never Used  Substance and Sexual Activity   Alcohol use: Yes    Comment: Occasional   Drug use: No   Sexual activity: Not Currently    Birth control/protection: Surgical   Tobacco Counseling Counseling given: Not Answered  SDOH Screenings   Food Insecurity: No Food Insecurity (06/30/2024)  Housing: Low Risk  (06/30/2024)  Transportation Needs: No Transportation Needs (06/30/2024)  Utilities: Not At Risk (06/30/2024)  Alcohol Screen: Low Risk  (06/30/2024)  Depression (PHQ2-9): Low Risk  (06/30/2024)  Financial Resource Strain: Low Risk  (06/30/2024)  Physical Activity: Sufficiently Active (06/30/2024)  Recent Concern: Physical Activity - Insufficiently Active (06/01/2024)  Social Connections: Moderately Integrated (06/30/2024)  Stress: No Stress Concern Present (06/30/2024)  Tobacco Use: Low Risk  (06/30/2024)  Health Literacy: Adequate Health Literacy (06/30/2024)   See flowsheets for full screening details  Depression Screen PHQ 2 & 9 Depression Scale- Over the past 2 weeks, how often have you been bothered by any of the following problems? Little interest or pleasure in doing things: 0 Feeling down, depressed, or hopeless (PHQ Adolescent also includes...irritable): 0 PHQ-2  Total Score: 0 Trouble falling or staying asleep, or sleeping too much: 2 Feeling tired or having little energy: 0 Poor appetite or overeating (PHQ Adolescent also includes...weight loss): 0 Feeling bad about yourself - or that you are a failure or have let yourself or your family down: 0 Trouble concentrating on things, such as reading the newspaper or watching television (PHQ Adolescent also includes...like school work): 0 Moving or speaking so slowly that other people could have noticed. Or the opposite - being so fidgety or restless that you have been moving around a lot more than usual: 0 Thoughts that you would be better off dead, or of hurting yourself in some way: 0 PHQ-9 Total Score: 2 If you checked off any problems, how difficult have these problems made it for you to do your work, take care of things at home, or get along with other people?: Not difficult at all  Depression Treatment Depression Interventions/Treatment : EYV7-0 Score <4 Follow-up Not Indicated     Goals Addressed             This Visit's Progress    06/30/2024:       My healthcare goal for 2026 is to continue to lose weight.  My weight goal is to be 250 pounds so that I can have right knee surgery. I want to travel to [place]  to see my grand babies.             Objective:    Today's Vitals   06/30/24 1415  Weight: 276 lb (125.2 kg)  Height: 5' 5 (1.651 m)  PainSc: 0-No pain   Body mass index is 45.93 kg/m.  Hearing/Vision screen Hearing Screening - Comments:: Adequate hearing, no hearing aids needed. Vision Screening - Comments:: Adequate vision, with the use of eyeglasses. Eye exam completed by: Marty Ill, OD. Immunizations and Health Maintenance Health Maintenance  Topic Date Due   Diabetic kidney evaluation -  Urine ACR  09/01/2024   OPHTHALMOLOGY EXAM  10/16/2024   COVID-19 Vaccine (5 - Moderna risk 2025-26 season) 10/25/2024   HEMOGLOBIN A1C  12/01/2024   FOOT EXAM   01/26/2025   Diabetic kidney evaluation - eGFR measurement  06/03/2025   Medicare Annual Wellness (AWV)  06/30/2025   Mammogram  04/13/2026   Fecal DNA (Cologuard)  05/15/2026   DTaP/Tdap/Td (2 - Td or Tdap) 05/02/2031   Pneumococcal Vaccine: 50+ Years  Completed   Influenza Vaccine  Completed   Bone Density Scan  Completed   Hepatitis C Screening  Completed   Zoster Vaccines- Shingrix   Completed   Meningococcal B Vaccine  Aged Out   Hepatitis B Vaccines 19-59 Average Risk  Discontinued   Colonoscopy  Discontinued        Assessment/Plan:  This is a routine wellness examination for Autumn Carson.  Patient Care Team: Theotis Haze ORN, NP as PCP - General (Nurse Practitioner) Santo Stanly LABOR, MD as PCP - Cardiology (Cardiology) Joya Stabs, DPM as Consulting Physician (Podiatry) [facility] [place] as Consulting Physician (Optometry) Belinda Cough, MD as Consulting Physician (General Surgery) Faythe Purchase, MD as Consulting Physician (Endocrinology)  I have personally reviewed and noted the following in the patient's chart:   Medical and social history Use of alcohol, tobacco or illicit drugs  Current medications and supplements including opioid prescriptions. Functional ability and status Nutritional status Physical activity Advanced directives List of other physicians Hospitalizations, surgeries, and ER visits in previous 12 months Vitals Screenings to include cognitive, depression, and falls Referrals and appointments  No orders of the defined types were placed in this encounter.  In addition, I have reviewed and discussed with patient certain preventive protocols, quality metrics, and best practice recommendations. A written personalized care plan for preventive services as well as general preventive health recommendations were provided to patient.   Autumn LOISE Fuller, LPN   [id number]   Return in 1 year (on 06/30/2025).  After Visit Summary:  (MyChart) Due to this being a telephonic visit, the after visit summary with patients personalized plan was offered to patient via MyChart   Nurse Notes: None at this time.  Patient is up to date with care gaps.

## 2024-07-02 LAB — OPHTHALMOLOGY REPORT-SCANNED

## 2024-07-04 ENCOUNTER — Other Ambulatory Visit: Payer: Self-pay | Admitting: Nurse Practitioner

## 2024-07-04 DIAGNOSIS — K219 Gastro-esophageal reflux disease without esophagitis: Secondary | ICD-10-CM

## 2024-07-04 DIAGNOSIS — F5104 Psychophysiologic insomnia: Secondary | ICD-10-CM

## 2024-07-04 MED ORDER — ZOLPIDEM TARTRATE 5 MG PO TABS: ORAL_TABLET | ORAL | 3 refills | Status: AC

## 2024-07-22 ENCOUNTER — Other Ambulatory Visit: Payer: Self-pay | Admitting: Nurse Practitioner

## 2024-07-22 DIAGNOSIS — E119 Type 2 diabetes mellitus without complications: Secondary | ICD-10-CM

## 2024-07-24 NOTE — Telephone Encounter (Signed)
 Requested Prescriptions  Pending Prescriptions Disp Refills   OZEMPIC , 2 MG/DOSE, 8 MG/3ML SOPN [Pharmacy Med Name: OZEMPIC   2MG  SOLUTION PEN-INJECTOR  PEN DOSE] 9 mL 1    Sig: INJECT SUBCUTANEOUSLY 2 MG EVERY WEEK AS DIRECTED     Endocrinology:  Diabetes - GLP-1 Receptor Agonists - semaglutide  Failed - 07/24/2024  2:49 PM      Failed - HBA1C in normal range and within 180 days    Hemoglobin A1C  Date Value Ref Range Status  07/26/2016 6.2  Final    Comment:    [facility] home testing/[employer]   HbA1c, POC (controlled diabetic range)  Date Value Ref Range Status  12/02/2023 6.9 0.0 - 7.0 % Final   Hgb A1c MFr Bld  Date Value Ref Range Status  06/03/2024 6.0 (H) 4.8 - 5.6 % Final    Comment:             Prediabetes: 5.7 - 6.4          Diabetes: >6.4          Glycemic control for adults with diabetes: <7.0          Failed - Cr in normal range and within 360 days    Creat  Date Value Ref Range Status  06/06/2020 0.84 0.50 - 0.99 mg/dL Final    Comment:    For patients >75 years of age, the reference limit for Creatinine is approximately 13% higher for people identified as African-American. .    Creatinine, Ser  Date Value Ref Range Status  06/03/2024 1.06 (H) 0.57 - 1.00 mg/dL Final   Creatinine, Urine  Date Value Ref Range Status  06/06/2020 119 20 - 275 mg/dL Final         Passed - Valid encounter within last 6 months    Recent Outpatient Visits           1 month ago Primary hypertension   [place] [facility] [place] Theotis Haze ORN, NP   4 months ago Dizziness   [place] [facility] [place] [place], [place] W, NP   7 months ago Diabetes mellitus treated with injections of non-insulin  medication [facility])   [place] [facility] [place]. [facility] Theotis Haze ORN, NP   10 months ago Primary osteoarthritis of both knees   [place]  [facility] Of [place]. [facility] [place], [place] W, NP   1 year ago Type 2 diabetes mellitus with diabetic autonomic neuropathy, without long-term current use of insulin  [facility])   [place] [facility] Dept Of [place]. [facility] Theotis Haze ORN, [place]

## 2024-08-17 ENCOUNTER — Encounter: Payer: Self-pay | Admitting: Nurse Practitioner

## 2024-08-24 ENCOUNTER — Other Ambulatory Visit: Payer: Self-pay | Admitting: Nurse Practitioner

## 2024-08-24 DIAGNOSIS — M51369 Other intervertebral disc degeneration, lumbar region without mention of lumbar back pain or lower extremity pain: Secondary | ICD-10-CM

## 2024-08-24 DIAGNOSIS — M170 Bilateral primary osteoarthritis of knee: Secondary | ICD-10-CM

## 2024-08-24 MED ORDER — CELECOXIB 100 MG PO CAPS: 100.0000 mg | ORAL_CAPSULE | Freq: Two times a day (BID) | ORAL | 2 refills | Status: AC | PRN

## 2024-08-25 ENCOUNTER — Encounter: Payer: Self-pay | Admitting: Podiatry

## 2024-08-25 ENCOUNTER — Ambulatory Visit: Admitting: Podiatry

## 2024-08-25 DIAGNOSIS — L84 Corns and callosities: Secondary | ICD-10-CM

## 2024-08-25 DIAGNOSIS — E1143 Type 2 diabetes mellitus with diabetic autonomic (poly)neuropathy: Secondary | ICD-10-CM

## 2024-08-25 NOTE — Progress Notes (Signed)
"  °  Subjective:  Patient ID: Autumn Carson, female    DOB: 23-Mar-1956,  MRN: [id number]  Autumn Carson presents to clinic today for at risk foot care with history of diabetic neuropathy and painful thick toenails that are difficult to trim. Pain interferes with ambulation. Aggravating factors include wearing enclosed shoe gear. Pain is relieved with periodic professional debridement. She relates her left great toe is tender today. Chief Complaint  Patient presents with   [facility]    RM#[id number] [facility] 06/03/2024 6.0 last A1c next PCP appt scheduled for 3/206.   New problem(s): None.   PCP is Theotis Haze ORN, NP.  Allergies[1]  Review of Systems: Negative except as noted in the HPI.  Objective: No changes noted in today's physical examination. There were no vitals filed for this visit. Autumn Carson is a pleasant 69 y.o. female morbidly obese in NAD. AAO x 3.  Vascular Examination: Capillary refill time immediate b/l. Palpable pedal pulses. Pedal hair present b/l. No pain with calf compression b/l. Skin temperature gradient WNL b/l. No cyanosis or clubbing b/l. No ischemia or gangrene noted b/l. Nonpitting edema noted BLE.  Neurological Examination: Sensation grossly intact b/l with 10 gram monofilament. Vibratory sensation intact b/l. Pt has subjective symptoms of neuropathy.  Dermatological Examination: Pedal skin with normal turgor, texture and tone b/l.  No open wounds. No interdigital macerations.   Toenails 1-5 b/l thick, discolored, elongated with subungual debris and pain on dorsal palpation.   Left great toe with subungual callus with tenderness to palpation.  Hyperkeratotic lesion(s) medial border left hallux.  No erythema, no edema, no drainage, no fluctuance.  Musculoskeletal Examination: Muscle strength 5/5 to all lower extremity muscle groups bilaterally. Pes planus deformity noted bilateral LE.[name] No pain, crepitus or joint limitation noted with ROM b/l  LE.  Patient ambulates independently without assistive aids.  Radiographs: None  Assessment/Plan: 1. Callus   2. Type 2 diabetes mellitus with diabetic autonomic neuropathy, without long-term current use of insulin  [facility])   Consent given for treatment. Patient examined. All patient's and/or POA's questions/concerns addressed on today's visit. As a courtesy, toenails 1-5 b/l debrided in length and girth without incident. Subungual callus debrided without incident. Patient related improvement in symptoms. Continue foot and shoe inspections daily. Monitor blood glucose per PCP/Endocrinologist's recommendations. Continue soft, supportive shoe gear daily. Report any pedal injuries to medical professional. Call office if there are any questions/concerns.  Return in about 3 months (around 11/23/2024).  Delon LITTIE Merlin, DPM      [place] LOCATION: [address], [place] [address]                   Office [phone]   [place] LOCATION: [address] [place], [place] [address] Office [phone]      [1]  Allergies Allergen Reactions   Lisinopril  Anaphylaxis    angioedema   Flavoring Agent     Other Reaction(s): throat swelling (fruit/juice)   "

## 2024-10-02 ENCOUNTER — Ambulatory Visit: Admitting: Nurse Practitioner

## 2024-10-28 ENCOUNTER — Ambulatory Visit: Admitting: Internal Medicine

## 2024-11-25 ENCOUNTER — Ambulatory Visit: Admitting: Podiatry
